# Patient Record
Sex: Male | Born: 1950
Health system: Southern US, Community
[De-identification: ages and names within clinical notes are randomized; demographics above are authoritative.]

## PROBLEM LIST (undated history)

## (undated) DIAGNOSIS — B029 Zoster without complications: Secondary | ICD-10-CM

## (undated) DIAGNOSIS — IMO0001 Reserved for inherently not codable concepts without codable children: Secondary | ICD-10-CM

## (undated) DIAGNOSIS — D692 Other nonthrombocytopenic purpura: Secondary | ICD-10-CM

## (undated) DIAGNOSIS — K5790 Diverticulosis of intestine, part unspecified, without perforation or abscess without bleeding: Secondary | ICD-10-CM

## (undated) DIAGNOSIS — J45909 Unspecified asthma, uncomplicated: Secondary | ICD-10-CM

## (undated) DIAGNOSIS — R7989 Other specified abnormal findings of blood chemistry: Secondary | ICD-10-CM

## (undated) DIAGNOSIS — E079 Disorder of thyroid, unspecified: Secondary | ICD-10-CM

## (undated) DIAGNOSIS — K5792 Diverticulitis of intestine, part unspecified, without perforation or abscess without bleeding: Secondary | ICD-10-CM

## (undated) DIAGNOSIS — G43909 Migraine, unspecified, not intractable, without status migrainosus: Secondary | ICD-10-CM

## (undated) DIAGNOSIS — K439 Ventral hernia without obstruction or gangrene: Secondary | ICD-10-CM

## (undated) DIAGNOSIS — C9110 Chronic lymphocytic leukemia of B-cell type not having achieved remission: Secondary | ICD-10-CM

## (undated) DIAGNOSIS — IMO0002 Reserved for concepts with insufficient information to code with codable children: Secondary | ICD-10-CM

## (undated) DIAGNOSIS — C801 Malignant (primary) neoplasm, unspecified: Secondary | ICD-10-CM

## (undated) DIAGNOSIS — T7840XA Allergy, unspecified, initial encounter: Secondary | ICD-10-CM

## (undated) DIAGNOSIS — I10 Essential (primary) hypertension: Secondary | ICD-10-CM

## (undated) DIAGNOSIS — U071 COVID-19: Secondary | ICD-10-CM

## (undated) DIAGNOSIS — J449 Chronic obstructive pulmonary disease, unspecified: Secondary | ICD-10-CM

## (undated) DIAGNOSIS — M1990 Unspecified osteoarthritis, unspecified site: Secondary | ICD-10-CM

## (undated) DIAGNOSIS — H9190 Unspecified hearing loss, unspecified ear: Secondary | ICD-10-CM

## (undated) DIAGNOSIS — K219 Gastro-esophageal reflux disease without esophagitis: Secondary | ICD-10-CM

## (undated) HISTORY — DX: Reserved for concepts with insufficient information to code with codable children: IMO0002

## (undated) HISTORY — DX: Other nonthrombocytopenic purpura: D69.2

## (undated) HISTORY — PX: HERNIA REPAIR: SHX51

## (undated) HISTORY — DX: Essential (primary) hypertension: I10

## (undated) HISTORY — DX: Zoster without complications: B02.9

## (undated) HISTORY — PX: COLONOSCOPY: SHX174

## (undated) HISTORY — DX: Disorder of thyroid, unspecified: E07.9

## (undated) HISTORY — DX: Chronic lymphocytic leukemia of B-cell type not having achieved remission: C91.10

## (undated) HISTORY — PX: OTHER SURGICAL HISTORY: SHX169

## (undated) HISTORY — DX: Allergy, unspecified, initial encounter: T78.40XA

## (undated) HISTORY — DX: Other specified abnormal findings of blood chemistry: R79.89

## (undated) HISTORY — DX: Gastro-esophageal reflux disease without esophagitis: K21.9

## (undated) HISTORY — DX: Chronic obstructive pulmonary disease, unspecified: J44.9

## (undated) HISTORY — DX: Migraine, unspecified, not intractable, without status migrainosus: G43.909

## (undated) HISTORY — DX: Malignant (primary) neoplasm, unspecified: C80.1

## (undated) HISTORY — DX: Diverticulitis of intestine, part unspecified, without perforation or abscess without bleeding: K57.92

## (undated) HISTORY — DX: Unspecified osteoarthritis, unspecified site: M19.90

## (undated) HISTORY — DX: Unspecified asthma, uncomplicated: J45.909

---

## 2003-12-07 HISTORY — PX: NEPHRECTOMY: SHX65

## 2003-12-07 HISTORY — PX: KIDNEY CYST REMOVAL: SHX684

## 2006-01-06 HISTORY — PX: OTHER SURGICAL HISTORY: SHX169

## 2006-05-06 HISTORY — PX: CHOLECYSTECTOMY: SHX55

## 2006-08-06 HISTORY — PX: COLON SURGERY: SHX602

## 2013-01-31 LAB — TSH: TSH: 1.84 u[IU]/mL (ref 0.41–5.90)

## 2013-01-31 LAB — BASIC METABOLIC PANEL
BUN: 19 mg/dL (ref 4–21)
Creatinine: 1.6 mg/dL — AB (ref 0.6–1.3)
Potassium: 5.6 mmol/L — AB (ref 3.4–5.3)

## 2013-01-31 LAB — LIPID PANEL
Cholesterol: 172 mg/dL (ref 0–200)
HDL: 31 mg/dL — AB (ref 35–70)
LDL Cholesterol: 107 mg/dL

## 2013-01-31 LAB — HEPATIC FUNCTION PANEL
ALT: 12 U/L (ref 10–40)
AST: 18 U/L (ref 14–40)

## 2013-01-31 LAB — CBC AND DIFFERENTIAL: WBC: 12.1 10^3/mL

## 2013-07-16 ENCOUNTER — Ambulatory Visit (INDEPENDENT_AMBULATORY_CARE_PROVIDER_SITE_OTHER): Payer: Medicare Other | Admitting: Family Medicine

## 2013-07-16 ENCOUNTER — Encounter: Payer: Self-pay | Admitting: Family Medicine

## 2013-07-16 VITALS — BP 168/90 | HR 68 | Temp 97.0°F | Resp 19 | Ht 66.0 in | Wt 275.0 lb

## 2013-07-16 DIAGNOSIS — G894 Chronic pain syndrome: Secondary | ICD-10-CM

## 2013-07-16 DIAGNOSIS — J449 Chronic obstructive pulmonary disease, unspecified: Secondary | ICD-10-CM

## 2013-07-16 DIAGNOSIS — I10 Essential (primary) hypertension: Secondary | ICD-10-CM

## 2013-07-16 DIAGNOSIS — E669 Obesity, unspecified: Secondary | ICD-10-CM

## 2013-07-16 DIAGNOSIS — K219 Gastro-esophageal reflux disease without esophagitis: Secondary | ICD-10-CM

## 2013-07-16 DIAGNOSIS — E039 Hypothyroidism, unspecified: Secondary | ICD-10-CM

## 2013-07-16 DIAGNOSIS — N184 Chronic kidney disease, stage 4 (severe): Secondary | ICD-10-CM

## 2013-07-16 MED ORDER — AMLODIPINE BESYLATE 5 MG PO TABS: 5.0000 mg | ORAL_TABLET | Freq: Every day | ORAL | Status: DC

## 2013-07-16 MED ORDER — METOPROLOL TARTRATE 50 MG PO TABS: 50.0000 mg | ORAL_TABLET | Freq: Two times a day (BID) | ORAL | Status: DC

## 2013-07-16 MED ORDER — LEVOTHYROXINE SODIUM 50 MCG PO TABS: 50.0000 ug | ORAL_TABLET | Freq: Every day | ORAL | Status: DC

## 2013-07-16 MED ORDER — ALLOPURINOL 100 MG PO TABS: 100.0000 mg | ORAL_TABLET | Freq: Every day | ORAL | Status: DC

## 2013-07-16 NOTE — Patient Instructions (Signed)
F/U 2 months for blood pressure Please release medical records at first dose Medications refilled

## 2013-07-17 ENCOUNTER — Encounter: Payer: Self-pay | Admitting: Family Medicine

## 2013-07-17 DIAGNOSIS — J449 Chronic obstructive pulmonary disease, unspecified: Secondary | ICD-10-CM | POA: Insufficient documentation

## 2013-07-17 DIAGNOSIS — J4489 Other specified chronic obstructive pulmonary disease: Secondary | ICD-10-CM | POA: Insufficient documentation

## 2013-07-17 DIAGNOSIS — E039 Hypothyroidism, unspecified: Secondary | ICD-10-CM | POA: Insufficient documentation

## 2013-07-17 DIAGNOSIS — N183 Chronic kidney disease, stage 3 unspecified: Secondary | ICD-10-CM | POA: Insufficient documentation

## 2013-07-17 DIAGNOSIS — G894 Chronic pain syndrome: Secondary | ICD-10-CM | POA: Insufficient documentation

## 2013-07-17 DIAGNOSIS — K219 Gastro-esophageal reflux disease without esophagitis: Secondary | ICD-10-CM | POA: Insufficient documentation

## 2013-07-17 DIAGNOSIS — E669 Obesity, unspecified: Secondary | ICD-10-CM | POA: Insufficient documentation

## 2013-07-17 NOTE — Assessment & Plan Note (Signed)
Continue Tyelnol #3 as needed

## 2013-07-17 NOTE — Assessment & Plan Note (Signed)
On dexilant

## 2013-07-17 NOTE — Assessment & Plan Note (Signed)
Recent labs in Feb, at baseline, no longer follows with renal

## 2013-07-17 NOTE — Assessment & Plan Note (Signed)
Continue current inhalers, with exacerbations, does well on doxycycline and steroid taper

## 2013-07-17 NOTE — Assessment & Plan Note (Signed)
Uncontrolled add norvasc 5mg  to lopressor

## 2013-07-17 NOTE — Progress Notes (Signed)
  Subjective:    Patient ID: [name] Ball, male    DOB: 1951-06-27, 62 y.o.   MRN: [id number]  HPI  Patient here to establish care. Previous PCP Dr. Marikay Alar [facility]. Medications and history reviewed Patient has profound hearing loss which occurred after treated for  colon Abscess in 2007. He is less than 10% hearing in the right ear and is completely deaf in the left ear. He has seen multiple ear nose and throat specialist in no longer uses a hearing aide. His family uses American sign language. His wife communicates all messages to him. He has a significant past medical history including renal cell carcinoma status post left nephrectomy in 2005. He subsequently has had colon surgery with partial sigmoidectomy in 2007. He's had multiple abdominal hernia secondary to his surgeries that have been repaired. He's also had arthroscopic surgery on bilateral knees. He is a history of hypertension was on diuretics in the past however secondary to his chronic kidney disease as a result of his interventions per above he is now on metoprolol twice a day which she's been on for years. His wife has been monitoring his blood pressure at home it is been in the 140s to 150s systolic and he's been more fatigued than usual. He did see his PCP prior to coming into [place] about 4 months ago at that time his thyroid studies is normal his EKG was normal his labs were overall unremarkable.  He also has remote history of gastric ulcer and is currently on Exelon he was on protonic some Prilosec in the past  His testosterone deficiency and his wife gets q. monthly testosterone shots. He had bone density in the past as well which was fairly normal.  History of chronic migraines and uses Relpax as needed. He's also a history of chronic pain due to his multiple abdominal surgeries and history problems. He often will hold his breath when he is in severe pain which interferes with his breathing at  nighttime. He is given Tylenol #3 to help with this pain.  There is concern about obstructive sleep apnea and he had one home test however he did not tolerate anything on his face there for they have decided not to treat this.  COPD-really maintained on Asmanex he's tried Symbicort and Advair without any improvement in symptoms. Jehovah witness- faith ( no blood products)   Review of Systems  GEN- + fatigue, fever, weight loss,weakness, recent illness HEENT- denies eye drainage, change in vision, nasal discharge, CVS- denies chest pain, palpitations RESP- denies SOB, cough, wheeze ABD- denies N/V, change in stools, abd pain GU- denies dysuria, hematuria, dribbling, incontinence MSK-+ joint pain, muscle aches, injury Neuro- denies headache, dizziness, syncope, seizure activity      Objective:   Physical Exam GEN- NAD, alert and oriented x3, repeat BP 148/96 HEENT- PERRL, EOMI, non injected sclera, pink conjunctiva, MMM, oropharynx clear Neck- Supple, no thyromegaly CVS- RRR, no murmur RESP-CTAB ABD-NABS,soft,NT,ND EXT- No edema Pulses- Radial, DP- 2+ Psych- normal affect and mood, very cheerful       Assessment & Plan:

## 2013-07-17 NOTE — Assessment & Plan Note (Signed)
TFT normal in Feb 2014

## 2013-07-20 ENCOUNTER — Telehealth: Payer: Self-pay | Admitting: Family Medicine

## 2013-07-20 NOTE — Telephone Encounter (Signed)
Okay to continue medication If BP stays < 100/60's and he is dizzy, fatigued, headache, stop the medication

## 2013-07-20 NOTE — Telephone Encounter (Signed)
Spoke to wife and given provider recommendations

## 2013-07-20 NOTE — Telephone Encounter (Signed)
Feeling much better on new BP med.  BP reading today was 100/64  P-69. Tuesday 156/101,  Wednesday 150/88, Thursday  157/87 Only concern is do they need to worry about BP being to low?  Please advise

## 2013-07-20 NOTE — Telephone Encounter (Signed)
Wife called back.  They has rechecked BP and is now 126/80.  But still would like advise on previous question.

## 2013-08-03 ENCOUNTER — Telehealth: Payer: Self-pay | Admitting: Family Medicine

## 2013-08-03 MED ORDER — TESTOSTERONE CYPIONATE 200 MG/ML IM SOLN: 200.0000 mg | INTRAMUSCULAR | Status: DC

## 2013-08-03 MED ORDER — DEXLANSOPRAZOLE 60 MG PO CPDR: 60.0000 mg | DELAYED_RELEASE_CAPSULE | Freq: Every day | ORAL | Status: DC

## 2013-08-03 NOTE — Telephone Encounter (Signed)
?  ok to refill Testosterone

## 2013-08-03 NOTE — Telephone Encounter (Signed)
Med phoned in °

## 2013-08-03 NOTE — Telephone Encounter (Signed)
Dexilant 60 mg cap 1 QAM #30 Testosterone Cyp 200 mg/ml inject 1 mL in the muscle once a month #10

## 2013-08-03 NOTE — Telephone Encounter (Signed)
Okay to refill? 

## 2013-08-06 ENCOUNTER — Other Ambulatory Visit: Payer: Self-pay | Admitting: Family Medicine

## 2013-08-07 NOTE — Telephone Encounter (Signed)
Meds refilled.

## 2013-09-18 ENCOUNTER — Ambulatory Visit (INDEPENDENT_AMBULATORY_CARE_PROVIDER_SITE_OTHER): Payer: Medicare Other | Admitting: Family Medicine

## 2013-09-18 VITALS — BP 150/90 | HR 80 | Temp 97.9°F | Resp 20 | Wt 276.0 lb

## 2013-09-18 DIAGNOSIS — I10 Essential (primary) hypertension: Secondary | ICD-10-CM

## 2013-09-18 DIAGNOSIS — L309 Dermatitis, unspecified: Secondary | ICD-10-CM | POA: Insufficient documentation

## 2013-09-18 DIAGNOSIS — L259 Unspecified contact dermatitis, unspecified cause: Secondary | ICD-10-CM

## 2013-09-18 DIAGNOSIS — J449 Chronic obstructive pulmonary disease, unspecified: Secondary | ICD-10-CM

## 2013-09-18 DIAGNOSIS — J019 Acute sinusitis, unspecified: Secondary | ICD-10-CM | POA: Insufficient documentation

## 2013-09-18 LAB — CBC WITH DIFFERENTIAL/PLATELET
Basophils Absolute: 0 10*3/uL (ref 0.0–0.1)
Basophils Relative: 0 % (ref 0–1)
Eosinophils Relative: 5 % (ref 0–5)
HCT: 45.6 % (ref 39.0–52.0)
Lymphocytes Relative: 44 % (ref 12–46)
MCH: 27.2 pg (ref 26.0–34.0)
Monocytes Absolute: 0.8 10*3/uL (ref 0.1–1.0)
Neutro Abs: 5.9 10*3/uL (ref 1.7–7.7)
Neutrophils Relative %: 45 % (ref 43–77)
Platelets: 243 10*3/uL (ref 150–400)
RDW: 14.4 % (ref 11.5–15.5)
WBC: 13 10*3/uL — ABNORMAL HIGH (ref 4.0–10.5)

## 2013-09-18 MED ORDER — TRIAMCINOLONE ACETONIDE 0.1 % EX CREA: TOPICAL_CREAM | Freq: Two times a day (BID) | CUTANEOUS | Status: DC

## 2013-09-18 MED ORDER — DOXYCYCLINE HYCLATE 100 MG PO TABS: 100.0000 mg | ORAL_TABLET | Freq: Two times a day (BID) | ORAL | Status: DC

## 2013-09-18 MED ORDER — PREDNISONE 10 MG PO TABS: ORAL_TABLET | ORAL | Status: DC

## 2013-09-18 NOTE — Assessment & Plan Note (Signed)
No evidence of acute exacerbation, I have sent prednisone in case he deteriorates

## 2013-09-18 NOTE — Patient Instructions (Addendum)
Use the nasal saline Use mucinex We will call with labs Treating sinus infection Antibiotics given Prednisone given in case needed  Triamcinolone for the hand  F/U 4 months

## 2013-09-18 NOTE — Assessment & Plan Note (Signed)
Treat with doxycycline which he has done well with  Mucinex  Nasal saline

## 2013-09-18 NOTE — Progress Notes (Signed)
  Subjective:    Patient ID: [name] Ball, male    DOB: [birth year], 62 y.o.   MRN: [id number]  HPI  - Wife provides ASL  Pt here with cough and fatigue, nasal drainage for the past 3 weeks. Known COPD with asthma, wife states over past 3 weeks, has had cough intermittently more congestion and fatigue. She has not noticed any wheezing or worsening cough No OTC meds given. They do remember him being exposed to smoke when oil burned on the stove which made him cough some.   Hand rash- left hand itching and peeling for past couple of weeks. Initially started a few years ago after work exposure he was treated for a severe fungal rash in palms as well. Recently he noticed itching in his palm and peeling of skin, No blisters, no new contacts with skin such as lotion, soap, chemicals.  HTN- BP have been running well at home 130's with additional norvasc.   He was also worried about is memory which wife states he has complained about for years. It takes him a minute to remember someones name. No other problems with short term memory. Worse past few days but has not slept well due to illness. -- Will table this until next visit and perform MMSE   Review of Systems - per above  GEN- + fatigue, fever, weight loss,weakness, recent illness HEENT- denies eye drainage, change in vision,+ nasal discharge, CVS- denies chest pain, palpitations RESP- denies SOB, +cough, wheeze ABD- denies N/V, change in stools, abd pain Neuro- denies headache, dizziness, syncope, seizure activity      Objective:   Physical Exam GEN- NAD, alert and oriented x3,  HEENT- PERRL, EOMI, non injected sclera, pink conjunctiva, MMM, oropharynx clear, + left maxillary sinus tenderness, nares- thick discharge, TM bilateral wax Neck- Supple, no LAD CVS- RRR, no murmur RESP-CTAB, no wheeze, upper airway congestion EXT- No edema Pulses- Radial 2+ Skin- Left palm, mild erythema and peeling/dryness, no blisters, no papules       Assessment & Plan:

## 2013-09-18 NOTE — Assessment & Plan Note (Signed)
Topical steroid. 

## 2013-09-18 NOTE — Assessment & Plan Note (Signed)
BP has been doing well, at home with additional norvasc Check BMET, CBC

## 2013-09-19 ENCOUNTER — Telehealth: Payer: Self-pay | Admitting: Family Medicine

## 2013-09-19 LAB — BASIC METABOLIC PANEL
BUN: 14 mg/dL (ref 6–23)
Chloride: 100 mEq/L (ref 96–112)
Potassium: 5.2 mEq/L (ref 3.5–5.3)
Sodium: 136 mEq/L (ref 135–145)

## 2013-09-19 NOTE — Telephone Encounter (Signed)
I sent prednisone 10mg  so he would have to take less pills. The dose is correct

## 2013-09-19 NOTE — Telephone Encounter (Signed)
Patient was put on prednisone yesterday on a 5 mg dose . But when it was called into the pharmacy it was called in for 10 mg. What is supposed to take?

## 2013-09-19 NOTE — Telephone Encounter (Signed)
Pt aware.

## 2013-10-05 ENCOUNTER — Telehealth: Payer: Self-pay | Admitting: Family Medicine

## 2013-10-05 MED ORDER — ACETAMINOPHEN-CODEINE #3 300-30 MG PO TABS: 1.0000 | ORAL_TABLET | ORAL | Status: DC | PRN

## 2013-10-05 NOTE — Telephone Encounter (Signed)
Okay to refill? 

## 2013-10-05 NOTE — Telephone Encounter (Signed)
rx printed for signature.  Try to call patient .  Left mess need to come to office and pick up

## 2013-10-05 NOTE — Telephone Encounter (Signed)
Refill Tylenol #3 300/30 mg   Last refill 07/31/13  #90

## 2013-10-11 ENCOUNTER — Other Ambulatory Visit: Payer: Self-pay

## 2013-10-31 ENCOUNTER — Other Ambulatory Visit: Payer: Self-pay | Admitting: Family Medicine

## 2013-10-31 MED ORDER — MOMETASONE FUROATE 220 MCG/INH IN AEPB: 2.0000 | INHALATION_SPRAY | Freq: Two times a day (BID) | RESPIRATORY_TRACT | Status: DC

## 2013-10-31 NOTE — Telephone Encounter (Signed)
Pts wife Joyce Gross is calling this morning to check on the status of her husbands refill on his asmex. She states that [facility] in [place] has tried to reach [place] to refill this. She would like to have it refilled today if anyway possible Call back number is [phone]

## 2013-10-31 NOTE — Telephone Encounter (Signed)
Med have been refilled already

## 2013-10-31 NOTE — Telephone Encounter (Signed)
Meds refilled.

## 2013-10-31 NOTE — Telephone Encounter (Signed)
His wife is calling back to check on the status of his medication!

## 2013-11-05 ENCOUNTER — Other Ambulatory Visit: Payer: Self-pay | Admitting: Family Medicine

## 2013-11-05 NOTE — Telephone Encounter (Signed)
Medication refilled per protocol. 

## 2013-11-19 ENCOUNTER — Other Ambulatory Visit: Payer: Self-pay | Admitting: Family Medicine

## 2013-11-19 ENCOUNTER — Ambulatory Visit (INDEPENDENT_AMBULATORY_CARE_PROVIDER_SITE_OTHER): Payer: Medicare Other | Admitting: Family Medicine

## 2013-11-19 ENCOUNTER — Encounter: Payer: Self-pay | Admitting: Family Medicine

## 2013-11-19 VITALS — BP 140/88 | HR 78 | Temp 97.9°F | Resp 18 | Ht 66.0 in | Wt 276.0 lb

## 2013-11-19 DIAGNOSIS — K469 Unspecified abdominal hernia without obstruction or gangrene: Secondary | ICD-10-CM

## 2013-11-19 DIAGNOSIS — L304 Erythema intertrigo: Secondary | ICD-10-CM | POA: Insufficient documentation

## 2013-11-19 DIAGNOSIS — K439 Ventral hernia without obstruction or gangrene: Secondary | ICD-10-CM

## 2013-11-19 DIAGNOSIS — L259 Unspecified contact dermatitis, unspecified cause: Secondary | ICD-10-CM

## 2013-11-19 DIAGNOSIS — L309 Dermatitis, unspecified: Secondary | ICD-10-CM

## 2013-11-19 DIAGNOSIS — L538 Other specified erythematous conditions: Secondary | ICD-10-CM

## 2013-11-19 MED ORDER — CLOTRIMAZOLE-BETAMETHASONE 1-0.05 % EX CREA: 1.0000 "application " | TOPICAL_CREAM | Freq: Two times a day (BID) | CUTANEOUS | Status: DC

## 2013-11-19 MED ORDER — NYSTATIN 100000 UNIT/GM EX POWD: CUTANEOUS | Status: DC

## 2013-11-19 NOTE — Progress Notes (Signed)
   Subjective:    Patient ID: [name] Ball, male    DOB: September 05, 1951, 62 y.o.   MRN: [id number]  HPI Patient here with continued pain and peeling and dryness. He states his pain still very sensitive to both hot and cold temperatures. He also has a lot of itching of the skin. The peeling and roughness on his now calls and palms have worsened despite using triamcinolone cream. He has not been using any new chemicals on the hands. The rash does appear similar to when he had a fungal infection in the past per wife  He's also had ongoing pain on and off in his lower abdomen and pelvis this is the site of his recurrent hernias. He has multiple areas of mesh from multiple hernia surgeries. He would like to have this evaluated as he feels a ripping sensation when he moves certain ways and when he walks. He is concerned that one of the mesh have split   Review of Systems  GEN- denies fatigue, fever, weight loss,weakness, recent illness HEENT- denies eye drainage, change in vision, nasal discharge, CVS- denies chest pain, palpitations RESP- denies SOB, cough, wheeze ABD- denies N/V, change in stools,+ abd pain GU- denies dysuria, hematuria, dribbling, incontinence MSK- denies joint pain, muscle aches, injury Neuro- denies headache, dizziness, syncope, seizure activity      Objective:   Physical Exam GEN- NAD, alert and oriented x3,  CVS- RRR, no murmur RESP-CTAB ABD-NABS,soft,mild TTP lower ventral hernia, epigastric hernia noted easily reduced EXT- No edema Pulses- Radial 2+ Skin- Left palm, mild erythema and peeling/dryness, left knuckle - hyperkeratotic skin over knuckles, few cracks in skin Raised erythematous plaqued beneath pannus Right side, +moisture white discharge      Assessment & Plan:

## 2013-11-19 NOTE — Assessment & Plan Note (Signed)
We will use nystatin pattern beneath the pain is because of all of the excessive moisture

## 2013-11-19 NOTE — Assessment & Plan Note (Signed)
He's had multiple hernias as well as repairs. It is noted on exam that he has recurrent ventral hernias. I will obtain CT of abdomen and pelvis to see if he is waiting for his meds. We will then decide on surgical intervention. He would like to wait until after the new year to have this done.

## 2013-11-19 NOTE — Assessment & Plan Note (Addendum)
This still looks more like hyperkeratotic dermatitis. He does not have any lesions elsewhere on the body. Since he has had a fungal infection similar to this in the past I will switch him to Lotrisone cream twice a day. If this does not help and he will be referred to dermatology

## 2013-11-19 NOTE — Patient Instructions (Signed)
Use the powder beneath the abdomen  Use the cream on hands twice a day Call if hands not improved and dermatology referral will be done CT scan for the hernia F/U as previous

## 2013-11-20 NOTE — Telephone Encounter (Signed)
Okay to refill , give 2 

## 2013-11-20 NOTE — Telephone Encounter (Signed)
?   Ok to refill, last refill 10/05/13, last ov 11/19/13

## 2013-11-20 NOTE — Telephone Encounter (Signed)
Rx printed for provider signature and faxed to pharmacy 

## 2013-11-21 ENCOUNTER — Telehealth: Payer: Self-pay | Admitting: *Deleted

## 2013-11-21 NOTE — Telephone Encounter (Signed)
errow

## 2013-11-23 ENCOUNTER — Telehealth: Payer: Self-pay | Admitting: Family Medicine

## 2013-11-23 MED ORDER — IPRATROPIUM BROMIDE HFA 17 MCG/ACT IN AERS: 2.0000 | INHALATION_SPRAY | Freq: Four times a day (QID) | RESPIRATORY_TRACT | Status: DC

## 2013-11-23 NOTE — Telephone Encounter (Signed)
Medication refilled per protocol. 

## 2013-11-23 NOTE — Telephone Encounter (Signed)
Message copied by Donne Anon on Fri Nov 23, 2013 11:22 AM ------      Message from: [facility], Lindalou Hose      Created: Thu Nov 22, 2013  9:04 AM       Needs rescue inhaler- [facility] called in to [facility] ------

## 2013-11-26 ENCOUNTER — Telehealth: Payer: Self-pay | Admitting: Family Medicine

## 2013-11-26 NOTE — Telephone Encounter (Signed)
Pt wife is calling in regards to the mri that he was referred to she just wants to make sure this is the right thing Call back number is  [phone]

## 2013-11-27 ENCOUNTER — Encounter: Payer: Self-pay | Admitting: Family Medicine

## 2013-12-03 ENCOUNTER — Other Ambulatory Visit: Payer: Self-pay | Admitting: Family Medicine

## 2013-12-04 ENCOUNTER — Other Ambulatory Visit (HOSPITAL_COMMUNITY): Payer: Self-pay

## 2013-12-04 NOTE — Addendum Note (Signed)
Addended by: Milinda Antis F on: 12/04/2013 08:37 PM   Modules accepted: Orders

## 2013-12-04 NOTE — Telephone Encounter (Signed)
Medication refilled per protocol. 

## 2013-12-13 ENCOUNTER — Telehealth: Payer: Self-pay | Admitting: *Deleted

## 2013-12-13 ENCOUNTER — Other Ambulatory Visit: Payer: Self-pay | Admitting: Family Medicine

## 2013-12-13 ENCOUNTER — Other Ambulatory Visit (HOSPITAL_COMMUNITY): Payer: Self-pay

## 2013-12-13 MED ORDER — DOXYCYCLINE HYCLATE 100 MG PO TABS: 100.0000 mg | ORAL_TABLET | Freq: Two times a day (BID) | ORAL | Status: DC

## 2013-12-13 NOTE — Telephone Encounter (Signed)
Pt called and made aware of RX 

## 2013-12-13 NOTE — Telephone Encounter (Signed)
Doxycycline 100 bid for 7 days. 

## 2013-12-13 NOTE — Telephone Encounter (Signed)
Pt wife calling wanting to know if you can call in doxycycline for pt (states that you told her to call and you will refill) he has respiratory issue and wheezing in chest. I told her needed to be seen if requesting antibiotic and wheezing in chest, again pt said that you have done this before.

## 2013-12-19 ENCOUNTER — Ambulatory Visit (HOSPITAL_COMMUNITY): Payer: Medicare Other

## 2013-12-26 ENCOUNTER — Ambulatory Visit (HOSPITAL_COMMUNITY)
Admission: RE | Admit: 2013-12-26 | Discharge: 2013-12-26 | Disposition: A | Discharge status: Home / Self Care | Payer: Medicare Other | Source: Ambulatory Visit | Attending: Family Medicine | Admitting: Family Medicine

## 2013-12-26 DIAGNOSIS — R109 Unspecified abdominal pain: Secondary | ICD-10-CM | POA: Insufficient documentation

## 2013-12-26 DIAGNOSIS — T191XXA Foreign body in bladder, initial encounter: Secondary | ICD-10-CM

## 2013-12-26 DIAGNOSIS — Z905 Acquired absence of kidney: Secondary | ICD-10-CM | POA: Insufficient documentation

## 2013-12-26 DIAGNOSIS — K439 Ventral hernia without obstruction or gangrene: Secondary | ICD-10-CM | POA: Insufficient documentation

## 2013-12-26 DIAGNOSIS — Z85528 Personal history of other malignant neoplasm of kidney: Secondary | ICD-10-CM | POA: Insufficient documentation

## 2013-12-26 DIAGNOSIS — T190XXA Foreign body in urethra, initial encounter: Secondary | ICD-10-CM | POA: Insufficient documentation

## 2013-12-26 DIAGNOSIS — IMO0002 Reserved for concepts with insufficient information to code with codable children: Secondary | ICD-10-CM | POA: Insufficient documentation

## 2013-12-26 DIAGNOSIS — K469 Unspecified abdominal hernia without obstruction or gangrene: Secondary | ICD-10-CM

## 2013-12-31 ENCOUNTER — Telehealth: Payer: Self-pay | Admitting: Family Medicine

## 2013-12-31 ENCOUNTER — Other Ambulatory Visit: Payer: Self-pay | Admitting: Family Medicine

## 2013-12-31 DIAGNOSIS — L309 Dermatitis, unspecified: Secondary | ICD-10-CM

## 2013-12-31 NOTE — Telephone Encounter (Signed)
Pt wife is calling about her husbands CT's and she states she has the other CT information about what DR done them and the date Call back number is [phone]

## 2014-01-01 NOTE — Telephone Encounter (Signed)
Called pt wife and she stated that Dr. Buelah Manis had already called her

## 2014-01-06 ENCOUNTER — Other Ambulatory Visit: Payer: Self-pay | Admitting: Family Medicine

## 2014-01-06 DIAGNOSIS — K469 Unspecified abdominal hernia without obstruction or gangrene: Secondary | ICD-10-CM

## 2014-01-07 ENCOUNTER — Other Ambulatory Visit: Payer: Self-pay | Admitting: Family Medicine

## 2014-01-09 ENCOUNTER — Encounter (HOSPITAL_COMMUNITY): Payer: Self-pay | Admitting: Emergency Medicine

## 2014-01-09 ENCOUNTER — Emergency Department (HOSPITAL_COMMUNITY)
Admission: EM | Admit: 2014-01-09 | Discharge: 2014-01-09 | Disposition: A | Discharge status: Home / Self Care | Payer: Medicare Other | Attending: Emergency Medicine | Admitting: Emergency Medicine

## 2014-01-09 ENCOUNTER — Emergency Department (HOSPITAL_COMMUNITY): Payer: Medicare Other

## 2014-01-09 DIAGNOSIS — Z792 Long term (current) use of antibiotics: Secondary | ICD-10-CM | POA: Insufficient documentation

## 2014-01-09 DIAGNOSIS — Z9889 Other specified postprocedural states: Secondary | ICD-10-CM | POA: Insufficient documentation

## 2014-01-09 DIAGNOSIS — J441 Chronic obstructive pulmonary disease with (acute) exacerbation: Secondary | ICD-10-CM | POA: Insufficient documentation

## 2014-01-09 DIAGNOSIS — IMO0002 Reserved for concepts with insufficient information to code with codable children: Secondary | ICD-10-CM | POA: Insufficient documentation

## 2014-01-09 DIAGNOSIS — Z87891 Personal history of nicotine dependence: Secondary | ICD-10-CM | POA: Insufficient documentation

## 2014-01-09 DIAGNOSIS — Z9089 Acquired absence of other organs: Secondary | ICD-10-CM | POA: Insufficient documentation

## 2014-01-09 DIAGNOSIS — Z79899 Other long term (current) drug therapy: Secondary | ICD-10-CM | POA: Insufficient documentation

## 2014-01-09 DIAGNOSIS — R109 Unspecified abdominal pain: Secondary | ICD-10-CM

## 2014-01-09 DIAGNOSIS — I10 Essential (primary) hypertension: Secondary | ICD-10-CM | POA: Insufficient documentation

## 2014-01-09 DIAGNOSIS — Z872 Personal history of diseases of the skin and subcutaneous tissue: Secondary | ICD-10-CM | POA: Insufficient documentation

## 2014-01-09 DIAGNOSIS — D692 Other nonthrombocytopenic purpura: Secondary | ICD-10-CM | POA: Insufficient documentation

## 2014-01-09 DIAGNOSIS — Z7982 Long term (current) use of aspirin: Secondary | ICD-10-CM | POA: Insufficient documentation

## 2014-01-09 DIAGNOSIS — K439 Ventral hernia without obstruction or gangrene: Secondary | ICD-10-CM | POA: Insufficient documentation

## 2014-01-09 DIAGNOSIS — K219 Gastro-esophageal reflux disease without esophagitis: Secondary | ICD-10-CM | POA: Insufficient documentation

## 2014-01-09 DIAGNOSIS — E039 Hypothyroidism, unspecified: Secondary | ICD-10-CM | POA: Insufficient documentation

## 2014-01-09 DIAGNOSIS — Z862 Personal history of diseases of the blood and blood-forming organs and certain disorders involving the immune mechanism: Secondary | ICD-10-CM | POA: Insufficient documentation

## 2014-01-09 DIAGNOSIS — E291 Testicular hypofunction: Secondary | ICD-10-CM | POA: Insufficient documentation

## 2014-01-09 DIAGNOSIS — J45901 Unspecified asthma with (acute) exacerbation: Secondary | ICD-10-CM

## 2014-01-09 DIAGNOSIS — G43909 Migraine, unspecified, not intractable, without status migrainosus: Secondary | ICD-10-CM | POA: Insufficient documentation

## 2014-01-09 LAB — COMPREHENSIVE METABOLIC PANEL
ALK PHOS: 46 U/L (ref 39–117)
ALT: 19 U/L (ref 0–53)
AST: 14 U/L (ref 0–37)
Albumin: 3.6 g/dL (ref 3.5–5.2)
BUN: 20 mg/dL (ref 6–23)
CO2: 25 mEq/L (ref 19–32)
Calcium: 8.9 mg/dL (ref 8.4–10.5)
Chloride: 101 mEq/L (ref 96–112)
Creatinine, Ser: 1.47 mg/dL — ABNORMAL HIGH (ref 0.50–1.35)
GFR calc non Af Amer: 49 mL/min — ABNORMAL LOW (ref >90)
GFR, EST AFRICAN AMERICAN: 57 mL/min — AB (ref >90)
GLUCOSE: 98 mg/dL (ref 70–99)
POTASSIUM: 4.6 meq/L (ref 3.7–5.3)
SODIUM: 137 meq/L (ref 137–147)
TOTAL PROTEIN: 6.5 g/dL (ref 6.0–8.3)
Total Bilirubin: 0.5 mg/dL (ref 0.3–1.2)

## 2014-01-09 LAB — CBC WITH DIFFERENTIAL/PLATELET
BASOS ABS: 0 10*3/uL (ref 0.0–0.1)
BASOS PCT: 0 % (ref 0–1)
EOS ABS: 0.7 10*3/uL (ref 0.0–0.7)
EOS PCT: 6 % — AB (ref 0–5)
HEMATOCRIT: 44.8 % (ref 39.0–52.0)
Hemoglobin: 14.3 g/dL (ref 13.0–17.0)
Lymphocytes Relative: 50 % — ABNORMAL HIGH (ref 12–46)
Lymphs Abs: 5.9 10*3/uL — ABNORMAL HIGH (ref 0.7–4.0)
MCH: 27.3 pg (ref 26.0–34.0)
MCHC: 31.9 g/dL (ref 30.0–36.0)
MCV: 85.7 fL (ref 78.0–100.0)
MONO ABS: 0.7 10*3/uL (ref 0.1–1.0)
Monocytes Relative: 6 % (ref 3–12)
Neutro Abs: 4.5 10*3/uL (ref 1.7–7.7)
Neutrophils Relative %: 38 % — ABNORMAL LOW (ref 43–77)
Platelets: 207 10*3/uL (ref 150–400)
RBC: 5.23 MIL/uL (ref 4.22–5.81)
RDW: 15 % (ref 11.5–15.5)
WBC: 11.8 10*3/uL — ABNORMAL HIGH (ref 4.0–10.5)

## 2014-01-09 LAB — LACTIC ACID, PLASMA: Lactic Acid, Venous: 0.8 mmol/L (ref 0.5–2.2)

## 2014-01-09 MED ORDER — SENNOSIDES-DOCUSATE SODIUM 8.6-50 MG PO TABS: 2.0000 | ORAL_TABLET | Freq: Every day | ORAL | Status: DC

## 2014-01-09 MED ORDER — HYDROMORPHONE HCL PF 1 MG/ML IJ SOLN
1.0000 mg | Freq: Once | INTRAMUSCULAR | Status: AC
  Administered 2014-01-09: 1 mg via INTRAVENOUS
  Filled 2014-01-09: qty 1

## 2014-01-09 MED ORDER — ACETAMINOPHEN-CODEINE #3 300-30 MG PO TABS: 1.0000 | ORAL_TABLET | ORAL | Status: DC | PRN

## 2014-01-09 MED ORDER — ONDANSETRON HCL 4 MG/2ML IJ SOLN
4.0000 mg | Freq: Once | INTRAMUSCULAR | Status: AC
  Administered 2014-01-09: 4 mg via INTRAVENOUS
  Filled 2014-01-09: qty 2

## 2014-01-09 MED ORDER — SODIUM CHLORIDE 0.9 % IV SOLN
Freq: Once | INTRAVENOUS | Status: AC
  Administered 2014-01-09: 17:00:00 via INTRAVENOUS

## 2014-01-09 MED ORDER — IOHEXOL 300 MG/ML  SOLN
50.0000 mL | Freq: Once | INTRAMUSCULAR | Status: AC | PRN
  Administered 2014-01-09: 50 mL via ORAL

## 2014-01-09 NOTE — Discharge Instructions (Signed)
As discussed, it is important that you follow up as soon as possible with your physician for continued management of your condition.  Your abdominal wall hernia requires specialist care.  If you develop any new, or concerning changes in your condition, please return to the emergency department immediately.

## 2014-01-09 NOTE — ED Notes (Signed)
Hernia, waiting to see Dr. Arnoldo Morale. Was referred to ED. Pain radiating around right side since last night.

## 2014-01-09 NOTE — ED Provider Notes (Signed)
CSN: [id number]     Arrival date & time 01/09/14  1306 History  This chart was scribed for Carmin Muskrat, MD by Ludger Nutting, ED Scribe. This patient was seen in room APA10/APA10 and the patient's care was started 3:37 PM.     Chief Complaint  Patient presents with  . Hernia    The history is provided by the patient and the spouse. No language interpreter was used.    HPI Comments: Mikolaj Woolstenhulme is a 63 y.o. male who presents to the Emergency Department complaining of constant  right side pain that is radiating around towards to the right back that worsened last night. Pt has a history of kidney surgery in 2005 and cholecystectomy in 2007. He has had abdominal mesh inserted in 2006, 2008, and 2010. Pt had a recent CT scan a few weeks ago which showed loops of small bowel protruding in the hernia. He denies fever, vomiting, scrotal swelling or pain.    Past Medical History  Diagnosis Date  . Allergy   . COPD (chronic obstructive pulmonary disease)   . Hypertension   . Thyroid disease     hypothryoid  . Migraine   . Ulcer   . Diverticulitis   . Solar purpura   . Asthma   . Low testosterone   . GERD (gastroesophageal reflux disease)    Past Surgical History  Procedure Laterality Date  . Kidney cyst removal  jan 2005    renal cell carcinoma  . Abdominal mesh inserted  june 2006, may 2008,dec 2010  . Knee minescus repair Left feb 2007  . Cholecystectomy  june 2007  . Colon surgery  sept 2007   No family history on file. History  Substance Use Topics  . Smoking status: Former Smoker    Quit date: 07/17/1999  . Smokeless tobacco: Not on file  . Alcohol Use: No    Review of Systems  Constitutional:       Per HPI, otherwise negative  HENT:       Per HPI, otherwise negative  Respiratory:       Per HPI, otherwise negative  Cardiovascular:       Per HPI, otherwise negative  Gastrointestinal: Negative for vomiting.  Endocrine:       Negative aside from HPI   Genitourinary:       Neg aside from HPI   Musculoskeletal:       Per HPI, otherwise negative  Skin: Negative.   Neurological: Negative for syncope.    Allergies  Review of patient's allergies indicates no known allergies.  Home Medications   Current Outpatient Rx  Name  Route  Sig  Dispense  Refill  . acetaminophen-codeine (TYLENOL #3) 300-30 MG per tablet      TAKE 1 TABLET BY MOUTH EVERY 4 HOURS AS NEEDED FOR FOR PAIN   90 tablet   1   . allopurinol (ZYLOPRIM) 100 MG tablet      TAKE 1 TABLET BY MOUTH EVERY DAY   90 tablet   0   . amLODipine (NORVASC) 5 MG tablet      TAKE 1 TABLET BY MOUTH EVERY DAY FOR BLOOD PRESSURE   30 tablet   2   . aspirin 325 MG EC tablet   Oral   Take 325 mg by mouth daily.         . calcium-vitamin D (OSCAL WITH D) 500-200 MG-UNIT per tablet   Oral   Take 1 tablet by mouth.         Marland Kitchen  clotrimazole-betamethasone (LOTRISONE) cream   Topical   Apply 1 application topically 2 (two) times daily.   45 g   1   . DEXILANT 60 MG capsule      TAKE 1 CAPSULE BY MOUTH DAILY   30 capsule   2   . diphenhydrAMINE (BENADRYL) 25 MG tablet   Oral   Take 25 mg by mouth every 6 (six) hours as needed for itching.         Marland Kitchen doxycycline (VIBRA-TABS) 100 MG tablet   Oral   Take 1 tablet (100 mg total) by mouth 2 (two) times daily.   14 tablet   0   . eletriptan (RELPAX) 20 MG tablet   Oral   Take 20 mg by mouth as needed for migraine. One tablet by mouth at onset of headache. May repeat in 2 hours if headache persists or recurs.         Marland Kitchen ipratropium (ATROVENT HFA) 17 MCG/ACT inhaler   Inhalation   Inhale 2 puffs into the lungs every 6 (six) hours.   1 Inhaler   11   . levalbuterol (XOPENEX HFA) 45 MCG/ACT inhaler   Inhalation   Inhale 1-2 puffs into the lungs every 4 (four) hours as needed for wheezing.         Marland Kitchen levothyroxine (SYNTHROID, LEVOTHROID) 50 MCG tablet      TAKE 1 TABLET BY MOUTH DAILY BEFORE BREAKFAST   90  tablet   1   . loratadine (CLARITIN) 10 MG tablet   Oral   Take 10 mg by mouth daily.         . metoprolol (LOPRESSOR) 50 MG tablet   Oral   Take 1 tablet (50 mg total) by mouth 2 (two) times daily.   180 tablet   1   . mometasone (ASMANEX) 220 MCG/INH inhaler   Inhalation   Inhale 2 puffs into the lungs 2 (two) times daily.   1 Inhaler   1   . nystatin (MYCOSTATIN/NYSTOP) 100000 UNIT/GM POWD      Apply to affected area three times a day   60 g   1   . predniSONE (DELTASONE) 10 MG tablet      Take 4 tablets x 3 days, then 3 tablets x 3 days x then 2 tablets x 3 days, then 1 tablets 3 days then stop   30 tablet   0   . testosterone cypionate (DEPO-TESTOSTERONE) 200 MG/ML injection   Intramuscular   Inject 1 mL (200 mg total) into the muscle every 14 (fourteen) days.   10 mL   2   . vitamin B-12 (CYANOCOBALAMIN) 1000 MCG tablet   Oral   Take 1,000 mcg by mouth daily.         . vitamin E 400 UNIT capsule   Oral   Take 400 Units by mouth daily.          BP 169/94  Pulse 78  Temp(Src) 97.4 F (36.3 C) (Oral)  Resp 18  SpO2 98% Physical Exam  Nursing note and vitals reviewed. Constitutional: He is oriented to person, place, and time. He appears well-developed. No distress.  HENT:  Head: Normocephalic and atraumatic.  Eyes: Conjunctivae and EOM are normal.  Cardiovascular: Normal rate and regular rhythm.   Pulmonary/Chest: Effort normal. No stridor. No respiratory distress. He has wheezes (Audible expiratory wheezing ).  Abdominal: Soft. He exhibits no distension. There is tenderness. A hernia is present.  Upper abdomen is soft and  non tender. Lower abdomen has a 15 cm palpable hernia with tenderness to palpation. No superficial changes  Musculoskeletal: He exhibits no edema.  Neurological: He is alert and oriented to person, place, and time.  Skin: Skin is warm and dry.  Psychiatric: He has a normal mood and affect.    ED Course  Procedures  (including critical care time)  DIAGNOSTIC STUDIES: Oxygen Saturation is 98% on RA, normal by my interpretation.    COORDINATION OF CARE: 3:47 PM Discussed treatment plan with pt at bedside and pt agreed to plan.   Labs Review Labs Reviewed - No data to display Imaging Review No results found.  EKG Interpretation   None     7:50 PM On exam the patient is ambulatory, smiling.  He denies new complaints.  I reviewed the findings with him and his wife.   MDM   I personally performed the services described in this documentation, which was scribed in my presence. The recorded information has been reviewed and is accurate.   This patient presents with ongoing pain from a previously diagnosed ventral hernia.  With the description of new, more severe pain, incarceration and/or strangulation were immediate considerations.  Patient's CT scan was largely reassuring, with no current evidence of strangulation and/or incarceration, though there is notable creation of both bladder and small bowel. Patient is appropriate for discharge with next a surgical followup  Carmin Muskrat, MD 01/09/14 [phone]

## 2014-01-14 ENCOUNTER — Other Ambulatory Visit: Payer: Self-pay | Admitting: Family Medicine

## 2014-01-15 ENCOUNTER — Telehealth: Payer: Self-pay | Admitting: *Deleted

## 2014-01-15 NOTE — Telephone Encounter (Signed)
Ok to refill 

## 2014-01-15 NOTE — Telephone Encounter (Signed)
Okay to refill, there is likley a quantity maximum so send in 9 pill with 3 refills

## 2014-01-16 MED ORDER — ELETRIPTAN HYDROBROMIDE 20 MG PO TABS: 20.0000 mg | ORAL_TABLET | ORAL | Status: DC | PRN

## 2014-01-16 NOTE — Telephone Encounter (Signed)
Meds refilled.

## 2014-01-17 ENCOUNTER — Encounter: Payer: Self-pay | Admitting: *Deleted

## 2014-01-17 NOTE — Telephone Encounter (Signed)
   This encounter was created in error - please disregard. This encouter was created in error-please disregard This encounter was created in error - please disregard.

## 2014-01-21 ENCOUNTER — Ambulatory Visit (INDEPENDENT_AMBULATORY_CARE_PROVIDER_SITE_OTHER): Payer: Medicare Other | Admitting: Surgery

## 2014-01-21 ENCOUNTER — Encounter (INDEPENDENT_AMBULATORY_CARE_PROVIDER_SITE_OTHER): Payer: Self-pay | Admitting: Surgery

## 2014-01-21 VITALS — BP 148/86 | HR 88 | Temp 97.9°F | Resp 16 | Ht 66.0 in | Wt 274.8 lb

## 2014-01-21 DIAGNOSIS — K432 Incisional hernia without obstruction or gangrene: Secondary | ICD-10-CM | POA: Insufficient documentation

## 2014-01-21 NOTE — Progress Notes (Signed)
Patient ID: [name] Ball, male   DOB: [birth year], 63 y.o.   MRN: [id number]  Chief Complaint  Patient presents with  . New Evaluation    eval recurrent abd hernia    HPI [name] Ball is a 63 y.o. male.   HPI patient sent at the request of Alycia Rossetti, MD 4 complex incisional hernia. Patient has a complex surgical history of multiple abdominal operations done at outside facility prior to moving into the area. He has had a nephrectomy, partial colectomy, and subsequent incisional hernia repairs starting 2006. He has had a total of 3 repairs almost every other year with mesh. I do not have the op notes review at this point in time. He developed more lower bowel wall swelling and pain which prompted him to go to the emergency room at [facility] for evaluation. CT revealed multiple abdominal wall defects with the largest being infraumbilical containing bladder, small bowel and omentum. He was nonobstructed. He continues to have lower abdominal pain especially with movement or lifting. He is not vomiting. Bowels are moving. The pain is made better with rest.   Past Medical History  Diagnosis Date  . Allergy   . COPD (chronic obstructive pulmonary disease)   . Hypertension   . Thyroid disease     hypothryoid  . Migraine   . Ulcer   . Diverticulitis   . Solar purpura   . Asthma   . Low testosterone   . GERD (gastroesophageal reflux disease)   . Arthritis   . Cancer     kidney    Past Surgical History  Procedure Laterality Date  . Kidney cyst removal  jan 2005    renal cell carcinoma  . Abdominal mesh inserted  june 2006, may 2008,dec 2010  . Knee minescus repair Left feb 2007  . Cholecystectomy  june 2007  . Colon surgery  sept 2007  . Colonoscopy    . Hernia repair  05/2005 5/20108, 11/2009    with mesh    Family History  Problem Relation Age of Onset  . Cancer Mother     breast    Social History History  Substance Use Topics  . Smoking status: Former Smoker    Quit  date: 12/06/1996  . Smokeless tobacco: Never Used  . Alcohol Use: Yes     Comment: little     Allergies  Allergen Reactions  . Ether     Doesn't wake up from it    Current Outpatient Prescriptions  Medication Sig Dispense Refill  . acetaminophen-codeine (TYLENOL #3) 300-30 MG per tablet Take 1 tablet by mouth every 4 (four) hours as needed for moderate pain.  30 tablet  0  . allopurinol (ZYLOPRIM) 100 MG tablet Take 100 mg by mouth at bedtime.      Marland Kitchen amLODipine (NORVASC) 5 MG tablet Take 5 mg by mouth daily.      Marland Kitchen aspirin 325 MG EC tablet Take 650 mg by mouth at bedtime.       Marland Kitchen aspirin-acetaminophen-caffeine (EXCEDRIN MIGRAINE) 250-250-65 MG per tablet Take 1 tablet by mouth every 6 (six) hours as needed for migraine.      . cholecalciferol (VITAMIN D) 1000 UNITS tablet Take 1,000 Units by mouth daily.      . Clobetasol Prop Emollient Base (CLOBETASOL PROPIONATE E) 0.05 % emollient cream Apply topically 2 (two) times daily.      . clotrimazole-betamethasone (LOTRISONE) cream Apply 1 application topically 2 (two) times daily.  45 g  1  .  dexlansoprazole (DEXILANT) 60 MG capsule Take 60 mg by mouth at bedtime.      . diphenhydrAMINE (BENADRYL) 25 MG tablet Take 25 mg by mouth every 6 (six) hours as needed for allergies.       Marland Kitchen eletriptan (RELPAX) 20 MG tablet Take 1 tablet (20 mg total) by mouth as needed for migraine. One tablet by mouth at onset of headache. May repeat in 2 hours if headache persists or recurs.  10 tablet  3  . ipratropium (ATROVENT HFA) 17 MCG/ACT inhaler Inhale 2 puffs into the lungs every 6 (six) hours.  1 Inhaler  11  . levalbuterol (XOPENEX HFA) 45 MCG/ACT inhaler Inhale 1-2 puffs into the lungs every 4 (four) hours as needed for wheezing.      Marland Kitchen levothyroxine (SYNTHROID, LEVOTHROID) 50 MCG tablet Take 50 mcg by mouth daily before breakfast.      . loratadine (CLARITIN) 10 MG tablet Take 10 mg by mouth at bedtime.       . metoprolol (LOPRESSOR) 50 MG tablet TAKE  1 TABLET BY MOUTH TWICE DAILY  60 tablet  1  . mometasone (ASMANEX) 220 MCG/INH inhaler Inhale 2 puffs into the lungs 2 (two) times daily.  1 Inhaler  1  . nystatin (MYCOSTATIN/NYSTOP) 100000 UNIT/GM POWD daily as needed (Fungus). Apply to affected area three times a day      . testosterone cypionate (DEPO-TESTOSTERONE) 200 MG/ML injection Inject 1 mL (200 mg total) into the muscle every 14 (fourteen) days.  10 mL  2  . vitamin B-12 (CYANOCOBALAMIN) 1000 MCG tablet Take 1,000 mcg by mouth daily.      . vitamin E 400 UNIT capsule Take 400 Units by mouth daily.       No current facility-administered medications for this visit.    Review of Systems Review of Systems  Constitutional: Negative for unexpected weight change.  HENT: Negative.   Eyes: Negative.   Respiratory: Positive for shortness of breath and wheezing.   Cardiovascular: Positive for leg swelling.  Gastrointestinal: Positive for abdominal pain and abdominal distention. Negative for nausea, vomiting and diarrhea.  Endocrine: Negative.   Genitourinary: Negative.   Musculoskeletal: Positive for arthralgias, back pain, gait problem, joint swelling and myalgias.  Allergic/Immunologic: Negative.   Neurological: Negative.   Hematological: Negative.   Psychiatric/Behavioral: Negative.     Blood pressure 148/86, pulse 88, temperature 97.9 F (36.6 C), temperature source Oral, resp. rate 16, height 5\' 6"  (1.676 m), weight 274 lb 12.8 oz (124.648 kg).  Physical Exam Physical Exam  Constitutional: No distress.  HENT:  Head: Normocephalic and atraumatic.  HOH  Eyes: Pupils are equal, round, and reactive to light. No scleral icterus.  Neck: Normal range of motion. Neck supple.  Cardiovascular: Normal rate and regular rhythm.   Pulmonary/Chest: Tachypnea noted. He has wheezes.  Abdominal: There is no tenderness. A hernia is present. Hernia confirmed positive in the ventral area.    Lymphadenopathy:    He has no cervical  adenopathy.  Neurological: He is alert.  Skin: Skin is warm and dry.  Psychiatric: He has a normal mood and affect. His behavior is normal. Judgment and thought content normal.    Data Reviewed CLINICAL DATA: Hernia. Worsening pain. Previous left nephrectomy  for renal cell carcinoma.  EXAM:  CT ABDOMEN AND PELVIS WITHOUT CONTRAST  TECHNIQUE:  Multidetector CT imaging of the abdomen and pelvis was performed  following the standard protocol without intravenous contrast.  COMPARISON: 12/26/2013  FINDINGS:  Minimal linear scarring  or atelectasis in the visualized lung bases.  Surgical clips in the gallbladder fossa and left nephrectomy bed.  Unremarkable liver, spleen, adrenal glands, right kidney, pancreas.  Unenhanced CT was performed per clinician order. Lack of IV contrast  limits sensitivity and specificity, especially for evaluation of  abdominal/pelvic solid viscera. Stable 15 mm portacaval node.  Numerous subcentimeter left para-aortic and aortocaval nodes are  stable. A 12 mm left para-aortic node image 44/2 stable. Stomach  distended by ingested material. Small bowel and colon nondilated.  Ventral hernia mesh. There is a large infraumbilical ventral hernia  containing a portion of the urinary bladder and loops of small bowel  and colon without evidence of obstruction or strangulation. An  curvilinear metallic density projects in the lumen of the urinary  bladder near the dome as before. Mildly enlarged right external  iliac chain lymph nodes stable. No ascites. No free air.  Degenerative disc disease L4-5 and L5-S1.  IMPRESSION:  1. Large infra umbilical ventral hernia containing loops of small  large bowel and a portion of the urinary bladder, without  obstruction or strangulation.  2. Right pelvic and retroperitoneal adenopathy post nephrectomy.  Recommend correlation with any more remote imaging studies to assess  stability, versus PET-CT to exclude metastatic disease  in the  setting of history of renal cell carcinoma post left nephrectomy.  Electronically Signed  By: Arne Cleveland M.D.  On: 01/09/2014 19:08   Assessment    Recurrent incisional ventral hernia with increasing pain  Loss of abdominal wall domain  Multiple previous incisional hernia repairs with failure  Patient Active Problem List   Diagnosis Date Noted  . Recurrent ventral incisional hernia 01/21/2014  . Abdominal wall hernia 11/19/2013  . Intertrigo 11/19/2013  . Acute sinusitis 09/18/2013  . Hand dermatitis 09/18/2013  . Hypothyroidism 07/17/2013  . Essential hypertension, malignant 07/17/2013  . Chronic kidney disease (CKD), stage IV (severe) 07/17/2013  . Obesity, unspecified 07/17/2013  . Chronic pain syndrome 07/17/2013  . COPD with asthma 07/17/2013  . GERD (gastroesophageal reflux disease) 07/17/2013      Plan    Had discussion with patient and daughter since he is hard of hearing. He has failed 3 previous repairs done at outside facility. He is short of breath at rest from his COPD. His BMI is 44. He is having more pain and after reviewing his CT scan has significant loss of domain. Recommend referral to tertiary care center for opinion about repair. Given his multiple medical problems, I feel he is a very high complication rate of well over 50% and possible mortality rate of surgery around 30-40%. Even with repair, I do not feel I can make his quality of life that much better.       [name] Ball A. 01/21/2014, 10:28 AM

## 2014-01-21 NOTE — Patient Instructions (Signed)
Hernia A hernia occurs when an internal organ pushes out through a weak spot in the abdominal wall. Hernias most commonly occur in the groin and around the navel. Hernias often can be pushed back into place (reduced). Most hernias tend to get worse over time. Some abdominal hernias can get stuck in the opening (irreducible or incarcerated hernia) and cannot be reduced. An irreducible abdominal hernia which is tightly squeezed into the opening is at risk for impaired blood supply (strangulated hernia). A strangulated hernia is a medical emergency. Because of the risk for an irreducible or strangulated hernia, surgery may be recommended to repair a hernia. CAUSES   Heavy lifting.  Prolonged coughing.  Straining to have a bowel movement.  A cut (incision) made during an abdominal surgery. HOME CARE INSTRUCTIONS   Bed rest is not required. You may continue your normal activities.  Avoid lifting more than 10 pounds (4.5 kg) or straining.  Cough gently. If you are a smoker it is best to stop. Even the best hernia repair can break down with the continual strain of coughing. Even if you do not have your hernia repaired, a cough will continue to aggravate the problem.  Do not wear anything tight over your hernia. Do not try to keep it in with an outside bandage or truss. These can damage abdominal contents if they are trapped within the hernia sac.  Eat a normal diet.  Avoid constipation. Straining over long periods of time will increase hernia size and encourage breakdown of repairs. If you cannot do this with diet alone, stool softeners may be used. SEEK IMMEDIATE MEDICAL CARE IF:   You have a fever.  You develop increasing abdominal pain.  You feel nauseous or vomit.  Your hernia is stuck outside the abdomen, looks discolored, feels hard, or is tender.  You have any changes in your bowel habits or in the hernia that are unusual for you.  You have increased pain or swelling around the  hernia.  You cannot push the hernia back in place by applying gentle pressure while lying down. MAKE SURE YOU:   Understand these instructions.  Will watch your condition.  Will get help right away if you are not doing well or get worse. Document Released: 11/22/2005 Document Revised: 02/14/2012 Document Reviewed: 07/11/2008 [facility] [address] Patient Information 2014 [place].     Will refer to [facility]  For opinion on hernia.

## 2014-01-23 ENCOUNTER — Telehealth (INDEPENDENT_AMBULATORY_CARE_PROVIDER_SITE_OTHER): Payer: Self-pay

## 2014-01-23 ENCOUNTER — Encounter (INDEPENDENT_AMBULATORY_CARE_PROVIDER_SITE_OTHER): Payer: Self-pay

## 2014-01-23 NOTE — Telephone Encounter (Signed)
Pt's referral to Dr. Alvan Dame at [facility] is scheduled for 01/31/14 at 1:00pm.  Records faxed.  Letter with appointment details sent to patient.  If patient has any questions or needs directions, they may call [phone].

## 2014-01-23 NOTE — Telephone Encounter (Signed)
Patient is aware of Appt for [facility] health 01-31-14 1p

## 2014-01-28 ENCOUNTER — Other Ambulatory Visit: Payer: Self-pay | Admitting: Family Medicine

## 2014-01-28 MED ORDER — DOXYCYCLINE HYCLATE 100 MG PO TABS: 100.0000 mg | ORAL_TABLET | Freq: Two times a day (BID) | ORAL | Status: DC

## 2014-01-28 MED ORDER — PREDNISONE 10 MG PO TABS: ORAL_TABLET | ORAL | Status: DC

## 2014-01-28 NOTE — Telephone Encounter (Signed)
Patient's wife was here in the office today he's had increased cough with production and wheezing typical of his COPD flares. She's requesting medications to start him on they're planning to have surgery for his hernias done at [facility] and he has his preop this Thursday   Will start doxycycline as well as prednisone taper

## 2014-01-30 ENCOUNTER — Other Ambulatory Visit: Payer: Self-pay | Admitting: Family Medicine

## 2014-02-06 ENCOUNTER — Other Ambulatory Visit: Payer: Self-pay | Admitting: Family Medicine

## 2014-02-07 NOTE — Telephone Encounter (Signed)
Ok to refill??  Last office visit 11/19/2013.  Last refill 01/09/2014.

## 2014-02-08 NOTE — Telephone Encounter (Signed)
Medication called to pharmacy. 

## 2014-02-08 NOTE — Telephone Encounter (Signed)
Okay to refill? 

## 2014-02-10 ENCOUNTER — Other Ambulatory Visit: Payer: Self-pay | Admitting: Family Medicine

## 2014-02-10 DIAGNOSIS — J449 Chronic obstructive pulmonary disease, unspecified: Secondary | ICD-10-CM

## 2014-02-10 DIAGNOSIS — Z01818 Encounter for other preprocedural examination: Secondary | ICD-10-CM

## 2014-02-14 ENCOUNTER — Ambulatory Visit (INDEPENDENT_AMBULATORY_CARE_PROVIDER_SITE_OTHER): Payer: Medicare Other | Admitting: Internal Medicine

## 2014-02-14 VITALS — BP 154/70 | HR 69 | Ht 66.0 in | Wt 276.0 lb

## 2014-02-14 DIAGNOSIS — I10 Essential (primary) hypertension: Secondary | ICD-10-CM

## 2014-02-14 NOTE — Patient Instructions (Addendum)
No follow up needed.  We will sent information to your doctor   Thank you for choosing [facility] !

## 2014-02-14 NOTE — Progress Notes (Signed)
HPI Patient is a 63 yo with history of morbid obesity, HTN, COPD, CKD who is referred for preop cardiac evaluation. Patient is being evaluated for ventral hernia repair. Patient hs no history of CAD In reviewing activity level he does some light house work.  He is in the garden a lot.  Digging, raking, using shovel. He also uses WE electronic games with grandchildren   He denies CP  Does have SOB which has been chronic and stable. NO palpitations.   Allergies  Allergen Reactions  . Ether Rash    Took a long time to wake up Doesn't wake up from it    Current Outpatient Prescriptions  Medication Sig Dispense Refill  . acetaminophen-codeine (TYLENOL #3) 300-30 MG per tablet TAKE 1 TABLET BY MOUTH EVERY 4 HOURS AS NEEDED FOR PAIN  90 tablet  0  . allopurinol (ZYLOPRIM) 100 MG tablet Take 100 mg by mouth at bedtime.      Marland Kitchen. amLODipine (NORVASC) 5 MG tablet Take 5 mg by mouth daily.      Marland Kitchen. aspirin 325 MG EC tablet Take 650 mg by mouth at bedtime.       Marland Kitchen. aspirin-acetaminophen-caffeine (EXCEDRIN MIGRAINE) 250-250-65 MG per tablet Take 1 tablet by mouth every 6 (six) hours as needed for migraine.      . cholecalciferol (VITAMIN D) 1000 UNITS tablet Take 1,000 Units by mouth daily.      . Clobetasol Prop Emollient Base (CLOBETASOL PROPIONATE E) 0.05 % emollient cream Apply topically 2 (two) times daily.      . clotrimazole-betamethasone (LOTRISONE) cream Apply 1 application topically 2 (two) times daily.  45 g  1  . DEXILANT 60 MG capsule TAKE 1 CAPSULE BY MOUTH EVERY DAY  30 capsule  0  . dexlansoprazole (DEXILANT) 60 MG capsule Take 60 mg by mouth at bedtime.      . diphenhydrAMINE (BENADRYL) 25 MG tablet Take 25 mg by mouth every 6 (six) hours as needed for allergies.       Marland Kitchen. doxycycline (VIBRA-TABS) 100 MG tablet Take 1 tablet (100 mg total) by mouth 2 (two) times daily.  14 tablet  0  . eletriptan (RELPAX) 20 MG tablet Take 1 tablet (20 mg total) by mouth as needed for migraine. One tablet by  mouth at onset of headache. May repeat in 2 hours if headache persists or recurs.  10 tablet  3  . ipratropium (ATROVENT HFA) 17 MCG/ACT inhaler Inhale 2 puffs into the lungs every 6 (six) hours.  1 Inhaler  11  . levalbuterol (XOPENEX HFA) 45 MCG/ACT inhaler Inhale 1-2 puffs into the lungs every 4 (four) hours as needed for wheezing.      Marland Kitchen. levothyroxine (SYNTHROID, LEVOTHROID) 50 MCG tablet Take 50 mcg by mouth daily before breakfast.      . loratadine (CLARITIN) 10 MG tablet Take 10 mg by mouth at bedtime.       . metoprolol (LOPRESSOR) 50 MG tablet TAKE 1 TABLET BY MOUTH TWICE DAILY  60 tablet  1  . mometasone (ASMANEX) 220 MCG/INH inhaler Inhale 2 puffs into the lungs 2 (two) times daily.  1 Inhaler  1  . nystatin (MYCOSTATIN/NYSTOP) 100000 UNIT/GM POWD daily as needed (Fungus). Apply to affected area three times a day      . predniSONE (DELTASONE) 10 MG tablet Take 40mg  x 3 days,30mg  x 3 days, 20mg  x 3 days, 10mg  x 3 days  30 tablet  0  . testosterone cypionate (DEPO-TESTOSTERONE) 200 MG/ML injection  Inject 1 mL (200 mg total) into the muscle every 14 (fourteen) days.  10 mL  2  . vitamin B-12 (CYANOCOBALAMIN) 1000 MCG tablet Take 1,000 mcg by mouth daily.      . vitamin E 400 UNIT capsule Take 400 Units by mouth daily.       No current facility-administered medications for this visit.    Past Medical History  Diagnosis Date  . Allergy   . COPD (chronic obstructive pulmonary disease)   . Hypertension   . Thyroid disease     hypothryoid  . Migraine   . Ulcer   . Diverticulitis   . Solar purpura   . Asthma   . Low testosterone   . GERD (gastroesophageal reflux disease)   . Arthritis   . Cancer     kidney    Past Surgical History  Procedure Laterality Date  . Kidney cyst removal  jan 2005    renal cell carcinoma  . Abdominal mesh inserted  june 2006, may 2008,dec 2010  . Knee minescus repair Left feb 2007  . Cholecystectomy  june 2007  . Colon surgery  sept 2007  .  Colonoscopy    . Hernia repair  05/2005 5/20108, 11/2009    with mesh    Family History  Problem Relation Age of Onset  . Cancer Mother     breast    History   Social History  . Marital Status: Married    Spouse Name: N/A    Number of Children: N/A  . Years of Education: N/A   Occupational History  . Not on file.   Social History Main Topics  . Smoking status: Former Smoker    Quit date: 12/06/1996  . Smokeless tobacco: Never Used  . Alcohol Use: Yes     Comment: little   . Drug Use: No  . Sexual Activity: Not on file   Other Topics Concern  . Not on file   Social History Narrative  . No narrative on file    Review of Systems:  All systems reviewed.  They are negative to the above problem except as previously stated.  Vital Signs: BP 154/70  Pulse 69  Ht 5\' 6"  (1.676 m)  Wt 276 lb (125.193 kg)  BMI 44.57 kg/m2  Physical Exam Patient is in NAD HEENT:  Normocephalic, atraumatic. EOMI, PERRLA.  Neck: JVP is normal.  No bruits.  Lungs: Some decreased airflow. No rales no wheezes.  Heart: Regular rate and rhythm. Normal S1, S2. No S3.   No significant murmurs. PMI not displaced.  Abdomen:  Supple, nontender. Ventral hernias  Extremities:   Good distal pulses throughout. No lower extremity edema.  Musculoskeletal :moving all extremities.  Neuro:   alert and oriented x3.  CN II-XII grossly intact.  EKG  :  SR.  70  Assessment and Plan:  Patient is a 63 yo being evaluated for noncardiac surgery  Patient with COPD and some SOB with activities.  Stable He is able to perform moderate gardening activities and can play with grandchildren.  These all demand greater than 4 METS of energy. EKG shows NSR>  Exam without volume overload  Overall, I think he is at low risk for a major cardiac event with planned surgery  and is OK to proceed without further cardiac testing.  Patient has appt in pulmonary soon.  PFTs scheduled.  BP is mildly elevated  Will need continued  follow up in long term  SHould improve with wt  loss   Will not schedule follow up for now in cardiology.    Dorris Carnes

## 2014-02-18 ENCOUNTER — Inpatient Hospital Stay (HOSPITAL_COMMUNITY): Payer: Medicare Other

## 2014-02-18 ENCOUNTER — Encounter (HOSPITAL_COMMUNITY): Payer: Self-pay | Admitting: Emergency Medicine

## 2014-02-18 ENCOUNTER — Telehealth: Payer: Self-pay | Admitting: *Deleted

## 2014-02-18 ENCOUNTER — Inpatient Hospital Stay (HOSPITAL_COMMUNITY)
Admission: EM | Admit: 2014-02-18 | Discharge: 2014-02-20 | DRG: 378 | Disposition: A | Discharge status: Home / Self Care | Payer: Medicare Other | Attending: Family Medicine | Admitting: Family Medicine

## 2014-02-18 DIAGNOSIS — K648 Other hemorrhoids: Secondary | ICD-10-CM | POA: Diagnosis present

## 2014-02-18 DIAGNOSIS — K219 Gastro-esophageal reflux disease without esophagitis: Secondary | ICD-10-CM

## 2014-02-18 DIAGNOSIS — K922 Gastrointestinal hemorrhage, unspecified: Secondary | ICD-10-CM

## 2014-02-18 DIAGNOSIS — Z6841 Body Mass Index (BMI) 40.0 and over, adult: Secondary | ICD-10-CM

## 2014-02-18 DIAGNOSIS — J45901 Unspecified asthma with (acute) exacerbation: Secondary | ICD-10-CM

## 2014-02-18 DIAGNOSIS — E039 Hypothyroidism, unspecified: Secondary | ICD-10-CM

## 2014-02-18 DIAGNOSIS — G894 Chronic pain syndrome: Secondary | ICD-10-CM | POA: Diagnosis present

## 2014-02-18 DIAGNOSIS — N184 Chronic kidney disease, stage 4 (severe): Secondary | ICD-10-CM | POA: Diagnosis present

## 2014-02-18 DIAGNOSIS — K439 Ventral hernia without obstruction or gangrene: Secondary | ICD-10-CM | POA: Diagnosis present

## 2014-02-18 DIAGNOSIS — Z85528 Personal history of other malignant neoplasm of kidney: Secondary | ICD-10-CM

## 2014-02-18 DIAGNOSIS — J019 Acute sinusitis, unspecified: Secondary | ICD-10-CM

## 2014-02-18 DIAGNOSIS — K625 Hemorrhage of anus and rectum: Secondary | ICD-10-CM

## 2014-02-18 DIAGNOSIS — J4489 Other specified chronic obstructive pulmonary disease: Secondary | ICD-10-CM

## 2014-02-18 DIAGNOSIS — Z87891 Personal history of nicotine dependence: Secondary | ICD-10-CM

## 2014-02-18 DIAGNOSIS — Z905 Acquired absence of kidney: Secondary | ICD-10-CM

## 2014-02-18 DIAGNOSIS — K5731 Diverticulosis of large intestine without perforation or abscess with bleeding: Principal | ICD-10-CM | POA: Diagnosis present

## 2014-02-18 DIAGNOSIS — N183 Chronic kidney disease, stage 3 unspecified: Secondary | ICD-10-CM | POA: Diagnosis present

## 2014-02-18 DIAGNOSIS — J449 Chronic obstructive pulmonary disease, unspecified: Secondary | ICD-10-CM

## 2014-02-18 DIAGNOSIS — E669 Obesity, unspecified: Secondary | ICD-10-CM | POA: Diagnosis present

## 2014-02-18 DIAGNOSIS — K432 Incisional hernia without obstruction or gangrene: Secondary | ICD-10-CM

## 2014-02-18 DIAGNOSIS — L304 Erythema intertrigo: Secondary | ICD-10-CM

## 2014-02-18 DIAGNOSIS — Z7982 Long term (current) use of aspirin: Secondary | ICD-10-CM

## 2014-02-18 DIAGNOSIS — I129 Hypertensive chronic kidney disease with stage 1 through stage 4 chronic kidney disease, or unspecified chronic kidney disease: Secondary | ICD-10-CM | POA: Diagnosis present

## 2014-02-18 DIAGNOSIS — H9190 Unspecified hearing loss, unspecified ear: Secondary | ICD-10-CM | POA: Diagnosis present

## 2014-02-18 DIAGNOSIS — D72829 Elevated white blood cell count, unspecified: Secondary | ICD-10-CM | POA: Diagnosis present

## 2014-02-18 DIAGNOSIS — J441 Chronic obstructive pulmonary disease with (acute) exacerbation: Secondary | ICD-10-CM | POA: Diagnosis present

## 2014-02-18 DIAGNOSIS — L309 Dermatitis, unspecified: Secondary | ICD-10-CM

## 2014-02-18 DIAGNOSIS — I10 Essential (primary) hypertension: Secondary | ICD-10-CM

## 2014-02-18 DIAGNOSIS — D62 Acute posthemorrhagic anemia: Secondary | ICD-10-CM | POA: Diagnosis present

## 2014-02-18 HISTORY — DX: Ventral hernia without obstruction or gangrene: K43.9

## 2014-02-18 HISTORY — DX: Diverticulosis of intestine, part unspecified, without perforation or abscess without bleeding: K57.90

## 2014-02-18 LAB — CBC WITH DIFFERENTIAL/PLATELET
BASOS ABS: 0 10*3/uL (ref 0.0–0.1)
Band Neutrophils: 0 % (ref 0–10)
Basophils Relative: 0 % (ref 0–1)
Blasts: 0 %
Eosinophils Absolute: 0.6 10*3/uL (ref 0.0–0.7)
Eosinophils Relative: 4 % (ref 0–5)
HCT: 43.3 % (ref 39.0–52.0)
Hemoglobin: 13.8 g/dL (ref 13.0–17.0)
Lymphocytes Relative: 49 % — ABNORMAL HIGH (ref 12–46)
Lymphs Abs: 7.1 10*3/uL — ABNORMAL HIGH (ref 0.7–4.0)
MCH: 27.7 pg (ref 26.0–34.0)
MCHC: 31.9 g/dL (ref 30.0–36.0)
MCV: 86.8 fL (ref 78.0–100.0)
MYELOCYTES: 0 %
Metamyelocytes Relative: 0 %
Monocytes Absolute: 0.7 10*3/uL (ref 0.1–1.0)
Monocytes Relative: 5 % (ref 3–12)
NEUTROS ABS: 6 10*3/uL (ref 1.7–7.7)
NEUTROS PCT: 42 % — AB (ref 43–77)
NRBC: 0 /100{WBCs}
PLATELETS: 222 10*3/uL (ref 150–400)
Promyelocytes Absolute: 0 %
RBC: 4.99 MIL/uL (ref 4.22–5.81)
RDW: 15 % (ref 11.5–15.5)
WBC: 14.4 10*3/uL — ABNORMAL HIGH (ref 4.0–10.5)

## 2014-02-18 LAB — CBC
HCT: 38 % — ABNORMAL LOW (ref 39.0–52.0)
HEMATOCRIT: 40 % (ref 39.0–52.0)
HEMOGLOBIN: 12.6 g/dL — AB (ref 13.0–17.0)
HEMOGLOBIN: 12.1 g/dL — AB (ref 13.0–17.0)
MCH: 27.5 pg (ref 26.0–34.0)
MCH: 27.8 pg (ref 26.0–34.0)
MCHC: 31.5 g/dL (ref 30.0–36.0)
MCHC: 31.8 g/dL (ref 30.0–36.0)
MCV: 87.3 fL (ref 78.0–100.0)
MCV: 87.4 fL (ref 78.0–100.0)
Platelets: 198 10*3/uL (ref 150–400)
Platelets: 207 10*3/uL (ref 150–400)
RBC: 4.58 MIL/uL (ref 4.22–5.81)
RBC: 4.35 MIL/uL (ref 4.22–5.81)
RDW: 15.1 % (ref 11.5–15.5)
RDW: 15.2 % (ref 11.5–15.5)
WBC: 13.6 10*3/uL — ABNORMAL HIGH (ref 4.0–10.5)
WBC: 14.4 10*3/uL — ABNORMAL HIGH (ref 4.0–10.5)

## 2014-02-18 LAB — BASIC METABOLIC PANEL
BUN: 17 mg/dL (ref 6–23)
CO2: 25 mEq/L (ref 19–32)
Calcium: 8.7 mg/dL (ref 8.4–10.5)
Chloride: 102 mEq/L (ref 96–112)
Creatinine, Ser: 1.39 mg/dL — ABNORMAL HIGH (ref 0.50–1.35)
GFR, EST AFRICAN AMERICAN: 61 mL/min — AB (ref >90)
GFR, EST NON AFRICAN AMERICAN: 52 mL/min — AB (ref >90)
GLUCOSE: 170 mg/dL — AB (ref 70–99)
POTASSIUM: 4.8 meq/L (ref 3.7–5.3)
SODIUM: 136 meq/L — AB (ref 137–147)

## 2014-02-18 LAB — TYPE AND SCREEN
ABO/RH(D): A POS
ANTIBODY SCREEN: NEGATIVE

## 2014-02-18 LAB — TSH: TSH: 1.521 u[IU]/mL (ref 0.350–4.500)

## 2014-02-18 MED ORDER — CLOTRIMAZOLE-BETAMETHASONE 1-0.05 % EX CREA
1.0000 "application " | TOPICAL_CREAM | Freq: Two times a day (BID) | CUTANEOUS | Status: DC
  Administered 2014-02-18 – 2014-02-20 (×4): 1 via TOPICAL
  Filled 2014-02-18 (×4): qty 15

## 2014-02-18 MED ORDER — SODIUM CHLORIDE 0.9 % IV SOLN
INTRAVENOUS | Status: DC
  Administered 2014-02-18 – 2014-02-19 (×3): via INTRAVENOUS

## 2014-02-18 MED ORDER — IPRATROPIUM BROMIDE 0.02 % IN SOLN: 0.5000 mg | RESPIRATORY_TRACT | Status: DC

## 2014-02-18 MED ORDER — ALBUTEROL SULFATE (2.5 MG/3ML) 0.083% IN NEBU: 2.5000 mg | INHALATION_SOLUTION | RESPIRATORY_TRACT | Status: DC | PRN

## 2014-02-18 MED ORDER — MORPHINE SULFATE 2 MG/ML IJ SOLN
1.0000 mg | INTRAMUSCULAR | Status: DC | PRN
  Administered 2014-02-18 – 2014-02-20 (×13): 1 mg via INTRAVENOUS
  Filled 2014-02-18 (×13): qty 1

## 2014-02-18 MED ORDER — ACETAMINOPHEN 325 MG PO TABS: 650.0000 mg | ORAL_TABLET | Freq: Four times a day (QID) | ORAL | Status: DC | PRN

## 2014-02-18 MED ORDER — ALBUTEROL SULFATE (2.5 MG/3ML) 0.083% IN NEBU: 2.5000 mg | INHALATION_SOLUTION | RESPIRATORY_TRACT | Status: DC

## 2014-02-18 MED ORDER — ONDANSETRON HCL 4 MG PO TABS: 4.0000 mg | ORAL_TABLET | Freq: Four times a day (QID) | ORAL | Status: DC | PRN

## 2014-02-18 MED ORDER — LORATADINE 10 MG PO TABS
10.0000 mg | ORAL_TABLET | Freq: Every day | ORAL | Status: DC
  Administered 2014-02-18 – 2014-02-19 (×2): 10 mg via ORAL
  Filled 2014-02-18 (×2): qty 1

## 2014-02-18 MED ORDER — PANTOPRAZOLE SODIUM 40 MG IV SOLR
40.0000 mg | INTRAVENOUS | Status: DC
  Administered 2014-02-18: 40 mg via INTRAVENOUS
  Filled 2014-02-18: qty 40

## 2014-02-18 MED ORDER — ONDANSETRON HCL 4 MG/2ML IJ SOLN
4.0000 mg | Freq: Four times a day (QID) | INTRAMUSCULAR | Status: DC | PRN
  Administered 2014-02-18 – 2014-02-20 (×7): 4 mg via INTRAVENOUS
  Filled 2014-02-18 (×7): qty 2

## 2014-02-18 MED ORDER — MORPHINE SULFATE 4 MG/ML IJ SOLN
4.0000 mg | Freq: Once | INTRAMUSCULAR | Status: AC
  Administered 2014-02-18: 4 mg via INTRAVENOUS
  Filled 2014-02-18: qty 1

## 2014-02-18 MED ORDER — LEVOTHYROXINE SODIUM 50 MCG PO TABS
50.0000 ug | ORAL_TABLET | Freq: Every day | ORAL | Status: DC
  Administered 2014-02-19 – 2014-02-20 (×2): 50 ug via ORAL
  Filled 2014-02-18 (×2): qty 1

## 2014-02-18 MED ORDER — IPRATROPIUM-ALBUTEROL 0.5-2.5 (3) MG/3ML IN SOLN
3.0000 mL | RESPIRATORY_TRACT | Status: DC
  Administered 2014-02-18 – 2014-02-19 (×6): 3 mL via RESPIRATORY_TRACT
  Filled 2014-02-18 (×6): qty 3

## 2014-02-18 MED ORDER — PANTOPRAZOLE SODIUM 40 MG IV SOLR
40.0000 mg | Freq: Once | INTRAVENOUS | Status: AC
  Administered 2014-02-18: 40 mg via INTRAVENOUS
  Filled 2014-02-18: qty 40

## 2014-02-18 MED ORDER — SODIUM CHLORIDE 0.9 % IV BOLUS (SEPSIS)
500.0000 mL | Freq: Once | INTRAVENOUS | Status: AC
  Administered 2014-02-18: 500 mL via INTRAVENOUS

## 2014-02-18 MED ORDER — ALLOPURINOL 100 MG PO TABS
100.0000 mg | ORAL_TABLET | Freq: Every day | ORAL | Status: DC
  Administered 2014-02-18 – 2014-02-19 (×2): 100 mg via ORAL
  Filled 2014-02-18 (×2): qty 1

## 2014-02-18 MED ORDER — ONDANSETRON HCL 4 MG/2ML IJ SOLN
4.0000 mg | Freq: Once | INTRAMUSCULAR | Status: AC
  Administered 2014-02-18: 4 mg via INTRAVENOUS
  Filled 2014-02-18: qty 2

## 2014-02-18 MED ORDER — ACETAMINOPHEN 650 MG RE SUPP: 650.0000 mg | Freq: Four times a day (QID) | RECTAL | Status: DC | PRN

## 2014-02-18 MED ORDER — ALUM & MAG HYDROXIDE-SIMETH 200-200-20 MG/5ML PO SUSP: 30.0000 mL | Freq: Four times a day (QID) | ORAL | Status: DC | PRN

## 2014-02-18 MED ORDER — NYSTATIN 100000 UNIT/GM EX POWD
Freq: Every day | CUTANEOUS | Status: DC | PRN
  Filled 2014-02-18: qty 15

## 2014-02-18 MED ORDER — PANTOPRAZOLE SODIUM 40 MG IV SOLR
40.0000 mg | Freq: Two times a day (BID) | INTRAVENOUS | Status: DC
  Administered 2014-02-18: 40 mg via INTRAVENOUS
  Filled 2014-02-18: qty 40

## 2014-02-18 NOTE — H&P (Signed)
[facility] History and Physical  Esvin Hnat YQM:[id number] DOB: 08-22-1951 DOA: 02/18/2014  Referring physician:  PCP: Vic Blackbird, MD   Chief Complaint: Bright Red blood per rectum  HPI: [name] Ball is a 63 y.o. male with past medical history that includes COPD not on home oxygen, hypertension, diverticulosis, GERD, multiple abdominal hernias status post several repairs, renal cancer status post nephrectomy 2005 presents with chief complaint of bright red blood per rectum. Information is obtained from the patient and his wife who interprets via sign language as patient is deaf. They report he was in his usual state of health until 3 AM this morning when he awakened eating to go to the bathroom. He reports he expelled bright red blood per rectum with very little stool. He states "I practically filled up the toilet". Associated symptoms include abdominal pain, nausea with vomiting some dizziness. Denies coffee ground emesis.  Reports the pain is epigastric nonradiating. He states he takes one aspirin 325 mg daily. He denies NSAID given his renal function. He reports having a colonoscopy and endoscopy in 2010 which revealed diverticulosis, hemorrhoids and gastric ulcer. In addition he has been more short of breath than usual but attributes this to the fact he developed a cold over the last 3 days from his grandchild. Is not on home oxygen and not on home nebulizers but is on inhalers. Initial evaluation in the emergency report yields complete blood count with hemoglobin of 52.8 basic metabolic panel creatinine of 13.9. He is hemodynamically stable and afebrile. He is not hypoxic. We are asked to admit  Review of Systems:  10 point review of systems completed and all systems are negative except as   Past Medical History  Diagnosis Date  . Allergy   . COPD (chronic obstructive pulmonary disease)   . Hypertension   . Thyroid disease     hypothryoid  . Migraine   . Ulcer   .  Diverticulitis   . Solar purpura   . Asthma   . Low testosterone   . GERD (gastroesophageal reflux disease)   . Arthritis   . Cancer     kidney   Past Surgical History  Procedure Laterality Date  . Kidney cyst removal  jan 2005    renal cell carcinoma  . Abdominal mesh inserted  june 2006, may 2008,dec 2010  . Knee minescus repair Left feb 2007  . Cholecystectomy  june 2007  . Colon surgery  sept 2007  . Colonoscopy    . Hernia repair  05/2005 5/20108, 11/2009    with mesh   Social History:  reports that he quit smoking about 17 years ago. He has never used smokeless tobacco. He reports that he drinks alcohol. He reports that he does not use illicit drugs. He is a retired Administrator. He lives at home with his wife. He recently relocated to [facility] from [place]  . Ether Rash    Took a long time to wake up Doesn't wake up from it    Family History  Problem Relation Age of Onset  . Cancer Mother     breast     Prior to Admission medications   Medication Sig Start Date End Date Taking? Authorizing Provider  acetaminophen-codeine (TYLENOL #3) 300-30 MG per tablet TAKE 1 TABLET BY MOUTH EVERY 4 HOURS AS NEEDED FOR PAIN   Yes Alycia Rossetti, MD  allopurinol (ZYLOPRIM) 100 MG tablet Take 100 mg by mouth at bedtime.  Yes Historical Provider, MD  amLODipine (NORVASC) 5 MG tablet Take 5 mg by mouth daily.   Yes Historical Provider, MD  aspirin EC 81 MG tablet Take 81 mg by mouth daily.   Yes Historical Provider, MD  aspirin-acetaminophen-caffeine (EXCEDRIN MIGRAINE) [phone] MG per tablet Take 1 tablet by mouth every 6 (six) hours as needed for migraine.   Yes Historical Provider, MD  cholecalciferol (VITAMIN D) 1000 UNITS tablet Take 1,000 Units by mouth daily.   Yes Historical Provider, MD  clobetasol cream (TEMOVATE) 0.34 % Apply 1 application topically 2 (two) times daily. 01/08/14  Yes Historical Provider, MD    clotrimazole-betamethasone (LOTRISONE) cream Apply 1 application topically 2 (two) times daily. 11/19/13  Yes Alycia Rossetti, MD  DEXILANT 60 MG capsule TAKE 1 CAPSULE BY MOUTH EVERY DAY   Yes Alycia Rossetti, MD  diphenhydrAMINE (BENADRYL) 25 MG tablet Take 25 mg by mouth every 6 (six) hours as needed for allergies.    Yes Historical Provider, MD  eletriptan (RELPAX) 20 MG tablet Take 1 tablet (20 mg total) by mouth as needed for migraine. One tablet by mouth at onset of headache. May repeat in 2 hours if headache persists or recurs. 01/16/14  Yes Alycia Rossetti, MD  ipratropium (ATROVENT HFA) 17 MCG/ACT inhaler Inhale 2 puffs into the lungs every 6 (six) hours. 11/23/13  Yes Alycia Rossetti, MD  levalbuterol [facility] HFA) 45 MCG/ACT inhaler Inhale 1-2 puffs into the lungs every 4 (four) hours as needed for wheezing.   Yes Historical Provider, MD  levothyroxine (SYNTHROID, LEVOTHROID) 50 MCG tablet Take 50 mcg by mouth daily before breakfast.   Yes Historical Provider, MD  loratadine (CLARITIN) 10 MG tablet Take 10 mg by mouth at bedtime.    Yes Historical Provider, MD  metoprolol (LOPRESSOR) 50 MG tablet TAKE 1 TABLET BY MOUTH TWICE DAILY 01/14/14  Yes Alycia Rossetti, MD  mometasone [facility]) 220 MCG/INH inhaler Inhale 2 puffs into the lungs 2 (two) times daily. 10/31/13  Yes Alycia Rossetti, MD  nystatin (MYCOSTATIN/NYSTOP) 100000 UNIT/GM POWD daily as needed (Fungus). Apply to affected area three times a day 11/19/13  Yes Alycia Rossetti, MD  testosterone cypionate (DEPO-TESTOSTERONE) 200 MG/ML injection Inject 1 mL (200 mg total) into the muscle every 14 (fourteen) days. 08/03/13  Yes Alycia Rossetti, MD  vitamin B-12 (CYANOCOBALAMIN) 1000 MCG tablet Take 1,000 mcg by mouth daily.   Yes Historical Provider, MD  vitamin E 400 UNIT capsule Take 400 Units by mouth daily.   Yes Historical Provider, MD   Physical Exam: Filed Vitals:   02/18/14 1238  BP: 113/68  Pulse: 90  Temp: 97.5 F  (36.4 C)  Resp: 16    BP 113/68  Pulse 90  Temp(Src) 97.5 F (36.4 C) (Oral)  Resp 16  Ht 5\' 6"  (1.676 m)  Wt 124.2 kg (273 lb 13 oz)  BMI 44.22 kg/m2  SpO2 97%  General:  Appears calm and comfortable, obese looks a little older than his stated age Eyes: PERRL, normal lids, irises & conjunctiva ENT: grossly normal hearing, lips & tongue poor dentition Neck: no LAD, masses or thyromegaly Cardiovascular: RRR, no m/r/g. No LE edema. Respiratory: Mild increased work of breathing with conversation. Breath sounds are diminished throughout. Mild expiratory wheeze diffusely.. Abdomen: soft,  positive bowel sounds throughout but diminished. mild tenderness throughout. No guarding no rebounding Skin: no rash or induration seen on limited exam Musculoskeletal: grossly normal tone BUE/BLE Psychiatric: grossly normal mood  and affect, speech fluent and appropriate Neurologic: grossly non-focal.speech clear facial symmetry patient is deaf           Labs on Admission:  Basic Metabolic Panel:  Recent Labs Lab 02/18/14 0924  NA 136*  K 4.8  CL 102  CO2 25  GLUCOSE 170*  BUN 17  CREATININE 1.39*  CALCIUM 8.7   Liver Function Tests: No results found for this basename: AST, ALT, ALKPHOS, BILITOT, PROT, ALBUMIN,  in the last 168 hours No results found for this basename: LIPASE, AMYLASE,  in the last 168 hours No results found for this basename: AMMONIA,  in the last 168 hours CBC:  Recent Labs Lab 02/18/14 0924  WBC 14.4*  NEUTROABS 6.0  HGB 13.8  HCT 43.3  MCV 86.8  PLT 222   Cardiac Enzymes: No results found for this basename: CKTOTAL, CKMB, CKMBINDEX, TROPONINI,  in the last 168 hours  BNP (last 3 results) No results found for this basename: PROBNP,  in the last 8760 hours CBG: No results found for this basename: GLUCAP,  in the last 168 hours  Radiological Exams on Admission: No results found.  EKG:    Assessment/Plan Principal Problem:   BRBPR (bright red  blood per rectum): Patient with history of diverticulosis and hemorrhoids and gastric ulcer. No NSAIDs. Takes one aspirin daily. Concern for GI bleed. Hemoglobin stable. Will admit. Will start protonic transvenously. Will keep him n.p.o. until gastroenterology consult. Will obtain serial CBCs to monitor hemoglobin. Currently hemoglobin 13.8. No episodes of bright red blood paretic complete since presentation. Active Problems:  COPD with asthma: May have mild exacerbation. Will provide nebs every 4 hours. Will obtain a chest x-ray. Currently he is not hypoxic on room air. Will monitor and provide oxygen supplementation as needed.   Essential hypertension, malignant: Currently blood pressure somewhat soft. Home medications include amlodipine, metoprolol. We will hold these for now. Will monitor his vital signs closely.    Hypothyroidism: Obtain a TSH. Continue home medication      Chronic kidney disease (CKD), stage IV (severe): Status post nephrectomy 2005- secondary to renal cancer. Will monitor renal output. Creatinine currently 1.3 which according to his chart appears to be close to his baseline.    Chronic pain syndrome: Appears stable at baseline.     GERD (gastroesophageal reflux disease): Will provide PPI.    Abdominal wall hernia: Patient seen by surgery at [facility]. Will need surgical repair. Has seen cardiology for clearance and now is planning for pulmonary evaluation from Dr. Luan Pulling for clearance as well.    Code Status: full Family Communication: wife at bedside Disposition Plan: home when ready  Time spent: 53 minutes  [facility] Pager [phone]

## 2014-02-18 NOTE — Telephone Encounter (Signed)
No vaccine noted.   MD please advise.

## 2014-02-18 NOTE — ED Notes (Signed)
Pt co rectal bleeding x 4 hours, pt staetes blood started bright red and then noticed dark tar looking stool after

## 2014-02-18 NOTE — H&P (Signed)
Patient seen and examined. Above note reviewed.   The patient has been admitted with bright red blood per rectum. He denies any nonsteroidal anti-inflammatory use. He does take 1 aspirin daily. Reports having a colonoscopy and EGD done in [place] in 2010. By history, it appears that he may have a diverticular bleed. GI consultation has been requested. Continue Protonix for now. Hemoglobin appears to be adequate. 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 We'll continue to monitor for further bleeding. Await GI input. We'll keep the patient n.p.o. after midnight in case procedures are planned for tomorrow.  [name] Ball

## 2014-02-18 NOTE — Telephone Encounter (Signed)
Added to EPIC chart and pt wife made aware.

## 2014-02-18 NOTE — ED Notes (Signed)
Pt is deaf but read lips very well.

## 2014-02-18 NOTE — ED Provider Notes (Signed)
CSN: [id number]     Arrival date & time 02/18/14  0901 History   This chart was scribed for Sharyon Cable, MD by Era Bumpers, ED scribe. This patient was seen in room APA11/APA11 and the patient's care was started at 0901.  Chief Complaint  Patient presents with  . Rectal Bleeding   The history is provided by the patient. No language interpreter was used.   HPI Comments: Cari Vandeberg is a 63 y.o. male who presents to the Emergency Department after he had an episode this morning in which he passed bright red blood this AM x3 hours ago and then an hour ago passed dark brown blood; there was no stool in either episode; just blood. He reports hx of hemorrhoids and had bright red blood but did not have dark brown blood before. He reports associated nausea, dizziness and abdominal pain. He denies any hematemesis. He takes no blood thinner medicines. He states he would not like to have any transfusions should he need any. He states hx of COPD.  Past Medical History  Diagnosis Date  . Allergy   . COPD (chronic obstructive pulmonary disease)   . Hypertension   . Thyroid disease     hypothryoid  . Migraine   . Ulcer   . Diverticulitis   . Solar purpura   . Asthma   . Low testosterone   . GERD (gastroesophageal reflux disease)   . Arthritis   . Cancer     kidney   Past Surgical History  Procedure Laterality Date  . Kidney cyst removal  jan 2005    renal cell carcinoma  . Abdominal mesh inserted  june 2006, may 2008,dec 2010  . Knee minescus repair Left feb 2007  . Cholecystectomy  june 2007  . Colon surgery  sept 2007  . Colonoscopy    . Hernia repair  05/2005 5/20108, 11/2009    with mesh   Family History  Problem Relation Age of Onset  . Cancer Mother     breast   History  Substance Use Topics  . Smoking status: Former Smoker    Quit date: 12/06/1996  . Smokeless tobacco: Never Used  . Alcohol Use: Yes     Comment: little     Review of Systems  Constitutional:  Negative for fever and chills.  Respiratory: Negative for cough and shortness of breath.   Cardiovascular: Negative for chest pain.  Gastrointestinal: Positive for nausea, abdominal pain and blood in stool. Negative for vomiting and diarrhea.  Musculoskeletal: Negative for back pain.  Neurological: Positive for weakness.  All other systems reviewed and are negative.   Allergies  Ether  Home Medications   Current Outpatient Rx  Name  Route  Sig  Dispense  Refill  . acetaminophen-codeine (TYLENOL #3) 300-30 MG per tablet      TAKE 1 TABLET BY MOUTH EVERY 4 HOURS AS NEEDED FOR PAIN   90 tablet   0   . allopurinol (ZYLOPRIM) 100 MG tablet   Oral   Take 100 mg by mouth at bedtime.         Marland Kitchen amLODipine (NORVASC) 5 MG tablet   Oral   Take 5 mg by mouth daily.         Marland Kitchen aspirin 325 MG EC tablet   Oral   Take 650 mg by mouth at bedtime.          Marland Kitchen aspirin-acetaminophen-caffeine (EXCEDRIN MIGRAINE) 250-250-65 MG per tablet   Oral   Take  1 tablet by mouth every 6 (six) hours as needed for migraine.         . cholecalciferol (VITAMIN D) 1000 UNITS tablet   Oral   Take 1,000 Units by mouth daily.         . Clobetasol Prop Emollient Base (CLOBETASOL PROPIONATE E) 0.05 % emollient cream   Topical   Apply topically 2 (two) times daily.         . clotrimazole-betamethasone (LOTRISONE) cream   Topical   Apply 1 application topically 2 (two) times daily.   45 g   1   . DEXILANT 60 MG capsule      TAKE 1 CAPSULE BY MOUTH EVERY DAY   30 capsule   0   . dexlansoprazole (DEXILANT) 60 MG capsule   Oral   Take 60 mg by mouth at bedtime.         . diphenhydrAMINE (BENADRYL) 25 MG tablet   Oral   Take 25 mg by mouth every 6 (six) hours as needed for allergies.          Marland Kitchen doxycycline (VIBRA-TABS) 100 MG tablet   Oral   Take 1 tablet (100 mg total) by mouth 2 (two) times daily.   14 tablet   0   . eletriptan (RELPAX) 20 MG tablet   Oral   Take 1 tablet (20  mg total) by mouth as needed for migraine. One tablet by mouth at onset of headache. May repeat in 2 hours if headache persists or recurs.   10 tablet   3   . ipratropium (ATROVENT HFA) 17 MCG/ACT inhaler   Inhalation   Inhale 2 puffs into the lungs every 6 (six) hours.   1 Inhaler   11   . levalbuterol (XOPENEX HFA) 45 MCG/ACT inhaler   Inhalation   Inhale 1-2 puffs into the lungs every 4 (four) hours as needed for wheezing.         Marland Kitchen levothyroxine (SYNTHROID, LEVOTHROID) 50 MCG tablet   Oral   Take 50 mcg by mouth daily before breakfast.         . loratadine (CLARITIN) 10 MG tablet   Oral   Take 10 mg by mouth at bedtime.          . metoprolol (LOPRESSOR) 50 MG tablet      TAKE 1 TABLET BY MOUTH TWICE DAILY   60 tablet   1     NO FURTHER REFILLS UNTIL PT IS SEEN   . mometasone (ASMANEX) 220 MCG/INH inhaler   Inhalation   Inhale 2 puffs into the lungs 2 (two) times daily.   1 Inhaler   1   . nystatin (MYCOSTATIN/NYSTOP) 100000 UNIT/GM POWD      daily as needed (Fungus). Apply to affected area three times a day         . predniSONE (DELTASONE) 10 MG tablet      Take 40mg  x 3 days,30mg  x 3 days, 20mg  x 3 days, 10mg  x 3 days   30 tablet   0   . testosterone cypionate (DEPO-TESTOSTERONE) 200 MG/ML injection   Intramuscular   Inject 1 mL (200 mg total) into the muscle every 14 (fourteen) days.   10 mL   2   . vitamin B-12 (CYANOCOBALAMIN) 1000 MCG tablet   Oral   Take 1,000 mcg by mouth daily.         . vitamin E 400 UNIT capsule   Oral   Take 400 Units  by mouth daily.          Triage Vitals: BP 133/78  Pulse 94  Temp(Src) 98 F (36.7 C) (Oral)  Resp 18  Ht 5\' 8"  (1.727 m)  Wt 276 lb (125.193 kg)  BMI 41.98 kg/m2  SpO2 97%  Physical Exam CONSTITUTIONAL: Well developed/well nourished HEAD: Normocephalic/atraumatic EYES: EOMI/PERRL ENMT: Mucous membranes moist NECK: supple no meningeal signs SPINE:entire spine nontender CV: S1/S2  noted, no murmurs/rubs/gallops noted LUNGS: Lungs are clear to auscultation bilaterally, no apparent distress ABDOMEN: soft, nontender, no rebound or guarding, large hernia, lower abdomen, non tender and no erythema and no significant tenderness RECTAL: Grossly bloody stool, no hemorrhoids, chaperone present.  GU:no cva tenderness NEURO: Pt is awake/alert, moves all extremitiesx4 EXTREMITIES: pulses normal, full ROM SKIN: warm, color normal PSYCH: no abnormalities of mood noted  ED Course  Procedures  DIAGNOSTIC STUDIES: Oxygen Saturation is 97% on room air, normal by my interpretation.    COORDINATION OF CARE: At 930 AM Discussed treatment plan with patient which includes pain/nausea medicine, blood work. Patient agrees.  10:29 AM Will admit for GI bleed Pt stable He is a Jehovah's Witness and refuses blood products.  This was endorsed to dr Roderic Palau for admission   Labs Review Labs Reviewed  CBC WITH DIFFERENTIAL - Abnormal; Notable for the following:    WBC 14.4 (*)    All other components within normal limits  BASIC METABOLIC PANEL  POC OCCULT BLOOD, ED  TYPE AND SCREEN    MDM   Final diagnoses:  GI bleed    Nursing notes including past medical history and social history reviewed and considered in documentation Labs/vital reviewed and considered  I personally performed the services described in this documentation, which was scribed in my presence. The recorded information has been reviewed and is accurate.      Sharyon Cable, MD 02/18/14 1030

## 2014-02-18 NOTE — Progress Notes (Signed)
Pt came to floor from ED. Pt is in NAD, VS are stable, only complains of nausea. NP is aware. Will continue to monitor.

## 2014-02-18 NOTE — ED Notes (Signed)
Pt presents with c/o rectal bleeding that began last night. He states that he had small amounts of blood in his stool last night and large amounts of blood in his stool on 2 occurences this morning. He stated "I filled the toilet up with blood." Patient became concerned and reported to the ED after the large amount of blood this morning. He stated the first occurrence of blood was bright red and the second was darker red in color. Pt also c/o some abdominal pain that has gotten progressively worse throughout the morning. He has had episodes of nausea and dry heaving. MM are wet. No apparent distress noted. Pt is also deaf but can hear some out of his right ear and can also read lips.

## 2014-02-18 NOTE — Telephone Encounter (Signed)
Pt had Pneumonia vaccine 08/01/09 at his last PCP, please call pt wife or hospital he does not need again, we will put into Epic

## 2014-02-18 NOTE — Telephone Encounter (Signed)
Message copied by Sheral Flow on Mon Feb 18, 2014  3:52 PM ------      Message from: [facility], Judson Roch J      Created: Mon Feb 18, 2014  3:28 PM      Contact: [phone]       Please call his wife Belenda Cruise- The hospital needs to know if Mr. Bifulco has had a pneumonia shot and if not would Dr. Buelah Manis      Recommend getting one ------

## 2014-02-19 ENCOUNTER — Encounter (HOSPITAL_COMMUNITY): Payer: Self-pay | Admitting: Gastroenterology

## 2014-02-19 ENCOUNTER — Inpatient Hospital Stay (HOSPITAL_COMMUNITY): Payer: Medicare Other

## 2014-02-19 DIAGNOSIS — K439 Ventral hernia without obstruction or gangrene: Secondary | ICD-10-CM

## 2014-02-19 DIAGNOSIS — D62 Acute posthemorrhagic anemia: Secondary | ICD-10-CM

## 2014-02-19 DIAGNOSIS — R933 Abnormal findings on diagnostic imaging of other parts of digestive tract: Secondary | ICD-10-CM

## 2014-02-19 DIAGNOSIS — N184 Chronic kidney disease, stage 4 (severe): Secondary | ICD-10-CM

## 2014-02-19 LAB — COMPREHENSIVE METABOLIC PANEL
ALK PHOS: 37 U/L — AB (ref 39–117)
ALT: 13 U/L (ref 0–53)
AST: 14 U/L (ref 0–37)
Albumin: 3.5 g/dL (ref 3.5–5.2)
BUN: 22 mg/dL (ref 6–23)
CO2: 26 meq/L (ref 19–32)
Calcium: 8.5 mg/dL (ref 8.4–10.5)
Chloride: 102 mEq/L (ref 96–112)
Creatinine, Ser: 1.84 mg/dL — ABNORMAL HIGH (ref 0.50–1.35)
GFR calc non Af Amer: 37 mL/min — ABNORMAL LOW (ref >90)
GFR, EST AFRICAN AMERICAN: 43 mL/min — AB (ref >90)
GLUCOSE: 134 mg/dL — AB (ref 70–99)
POTASSIUM: 4.9 meq/L (ref 3.7–5.3)
SODIUM: 139 meq/L (ref 137–147)
Total Bilirubin: 0.5 mg/dL (ref 0.3–1.2)
Total Protein: 5.9 g/dL — ABNORMAL LOW (ref 6.0–8.3)

## 2014-02-19 LAB — CBC
HCT: 38.5 % — ABNORMAL LOW (ref 39.0–52.0)
HEMATOCRIT: 32.4 % — AB (ref 39.0–52.0)
Hemoglobin: 12 g/dL — ABNORMAL LOW (ref 13.0–17.0)
Hemoglobin: 10.5 g/dL — ABNORMAL LOW (ref 13.0–17.0)
MCH: 27.7 pg (ref 26.0–34.0)
MCH: 28.3 pg (ref 26.0–34.0)
MCHC: 31.2 g/dL (ref 30.0–36.0)
MCHC: 32.4 g/dL (ref 30.0–36.0)
MCV: 88.9 fL (ref 78.0–100.0)
MCV: 87.3 fL (ref 78.0–100.0)
PLATELETS: 217 10*3/uL (ref 150–400)
Platelets: 181 10*3/uL (ref 150–400)
RBC: 4.33 MIL/uL (ref 4.22–5.81)
RBC: 3.71 MIL/uL — ABNORMAL LOW (ref 4.22–5.81)
RDW: 15.5 % (ref 11.5–15.5)
RDW: 15.5 % (ref 11.5–15.5)
WBC: 17.1 10*3/uL — ABNORMAL HIGH (ref 4.0–10.5)
WBC: 12 10*3/uL — AB (ref 4.0–10.5)

## 2014-02-19 MED ORDER — PANTOPRAZOLE SODIUM 40 MG PO TBEC
40.0000 mg | DELAYED_RELEASE_TABLET | Freq: Two times a day (BID) | ORAL | Status: DC
  Administered 2014-02-19 – 2014-02-20 (×2): 40 mg via ORAL
  Filled 2014-02-19 (×2): qty 1

## 2014-02-19 MED ORDER — PEG 3350-KCL-NA BICARB-NACL 420 G PO SOLR
4000.0000 mL | Freq: Once | ORAL | Status: AC
  Administered 2014-02-19: 4000 mL via ORAL
  Filled 2014-02-19: qty 4000

## 2014-02-19 MED ORDER — METOPROLOL TARTRATE 25 MG PO TABS
25.0000 mg | ORAL_TABLET | Freq: Two times a day (BID) | ORAL | Status: DC
  Administered 2014-02-19 – 2014-02-20 (×3): 25 mg via ORAL
  Filled 2014-02-19 (×3): qty 1

## 2014-02-19 MED ORDER — IPRATROPIUM-ALBUTEROL 0.5-2.5 (3) MG/3ML IN SOLN
3.0000 mL | Freq: Four times a day (QID) | RESPIRATORY_TRACT | Status: DC
  Administered 2014-02-20 (×2): 3 mL via RESPIRATORY_TRACT
  Filled 2014-02-19 (×4): qty 3

## 2014-02-19 NOTE — Progress Notes (Signed)
Pt was able to finish most of his Golytely without difficulty. Will continue to monitor.

## 2014-02-19 NOTE — Care Management Note (Signed)
    Page 1 of 1   02/19/2014     3:21:53 PM   CARE MANAGEMENT NOTE 02/19/2014  Patient:  HAZIEL, MOLNER   Account Number:  [id number]  Date Initiated:  02/19/2014  Documentation initiated by:  [name]  Subjective/Objective Assessment:   Pt admitted from home where he lives with his wife. Pt is HOH but wife translates. Pt and wife state they do not anticipate HH/DME.     Action/Plan:   Anticipated DC Date:     Anticipated DC Plan:  HOME/SELF CARE      DC Planning Services  CM consult      Choice offered to / List presented to:             Status of service:  Completed, signed off Medicare Important Message given?   (If response is "NO", the following Medicare IM given date fields will be blank) Date Medicare IM given:   Date Additional Medicare IM given:    Discharge Disposition:    Per UR Regulation:    If discussed at Long Length of Stay Meetings, dates discussed:    Comments:  02/19/14 [name] RN BSN CM

## 2014-02-19 NOTE — Progress Notes (Signed)
Patient seen and examined. Above note reviewed.  He is undergoing bowel prep for colonoscopy tomorrow. He continues to have bright red blood per rectum. Hemoglobin has mildly trended down but is still in acceptable range. Patient does not wish to receive any blood transfusions since he is a Jehovah's Witness.   COPD appears stable. He does not have evidence of wheezing at this time.  He has chronic kidney disease stage IV. His wife reports that his baseline creatinine runs between 1.6-1.8. He is status post nephrectomy in 2005 for renal cancer. Renal ultrasound is unremarkable. Continue to follow renal function.  Large abdominal wall hernia. He is undergoing evaluation at [facility] for surgical repair  [facility]

## 2014-02-19 NOTE — Consult Note (Signed)
REVIEWED.  

## 2014-02-19 NOTE — Progress Notes (Signed)
Utilization Review Complete  

## 2014-02-19 NOTE — Progress Notes (Signed)
Pt is on the golytely bowel prep for coloscopy tomorrow. Pt's wife is at bedside and is concerned about blood in stool. I called Laban Emperor, NP about wife's concerns, and I have be reassuring wife that blood is to be expected as pt is here with a rectal bleed and that he needs to continue to bowel prep so that his system is as clear as possible so that they have a the best shot at visualizing the bleed. Wife and pt are agreeable to continuing prep. Will continue to monitor.

## 2014-02-19 NOTE — Consult Note (Signed)
Referring Provider: Dyanne Carrel, NP Primary Care Physician:  Vic Blackbird, MD Primary Gastroenterologist:  Dr. Oneida Alar   Date of Admission: 02/18/14 Date of Consultation: 02/19/14  Reason for Consultation:  Rectal bleeding  HPI:  [name] Ball is a pleasant 63 year old male who is extremely hard of hearing. His wife is at the bedside and interprets via sign language. He is able to communicate without signing. He reports waking up at 3am yesterday morning with upper abdominal pain, then noted large amount of bright red blood per rectum. Acute onset. Had a second episode around 6 am, copious amounts. He had scant amount of hematochezia this morning after passing gas but no further significant bleeding since admission. Denies NSAID use. Takes aspirin daily. Notes more constipation lately but not significant. Admitting Hgb 13.8, trending down to 12 today. Pain located supraumbilically.   He reports last colonoscopy and endoscopy in 2010 in [place]. Per wife, benign polyps, diverticulosis, hemorrhoids, and question of gastric ulcer per wife's report. She states this is when he started taking Dexilant, which helps significantly with upper GI symptoms. No dysphagia or refractory GERD while taking Dexilant. He tells me he is in the process of evaluation for inguinal hernia repair at [facility].   Past Medical History  Diagnosis Date  . Allergy   . COPD (chronic obstructive pulmonary disease)   . Hypertension   . Thyroid disease     hypothryoid  . Migraine   . Ulcer   . Diverticulitis   . Solar purpura   . Asthma   . Low testosterone   . GERD (gastroesophageal reflux disease)   . Arthritis   . Cancer     kidney  . Ventral hernia     multiple with multiple repairs    Past Surgical History  Procedure Laterality Date  . Kidney cyst removal  jan 2005    renal cell carcinoma  . Abdominal mesh inserted  june 2006, may 2008,dec 2010  . Knee minescus repair Left feb 2007  .  Cholecystectomy  june 2007  . Colon surgery  sept 2007  . Colonoscopy    . Hernia repair  05/2005 5/20108, 11/2009    with mesh    Prior to Admission medications   Medication Sig Start Date End Date Taking? Authorizing Provider  acetaminophen-codeine (TYLENOL #3) 300-30 MG per tablet TAKE 1 TABLET BY MOUTH EVERY 4 HOURS AS NEEDED FOR PAIN   Yes Alycia Rossetti, MD  allopurinol (ZYLOPRIM) 100 MG tablet Take 100 mg by mouth at bedtime.   Yes Historical Provider, MD  amLODipine (NORVASC) 5 MG tablet Take 5 mg by mouth daily.   Yes Historical Provider, MD  aspirin EC 81 MG tablet Take 81 mg by mouth daily.   Yes Historical Provider, MD  aspirin-acetaminophen-caffeine (EXCEDRIN MIGRAINE) [phone] MG per tablet Take 1 tablet by mouth every 6 (six) hours as needed for migraine.   Yes Historical Provider, MD  cholecalciferol (VITAMIN D) 1000 UNITS tablet Take 1,000 Units by mouth daily.   Yes Historical Provider, MD  clobetasol cream (TEMOVATE) 8.65 % Apply 1 application topically 2 (two) times daily. 01/08/14  Yes Historical Provider, MD  clotrimazole-betamethasone (LOTRISONE) cream Apply 1 application topically 2 (two) times daily. 11/19/13  Yes Alycia Rossetti, MD  DEXILANT 60 MG capsule TAKE 1 CAPSULE BY MOUTH EVERY DAY   Yes Alycia Rossetti, MD  diphenhydrAMINE (BENADRYL) 25 MG tablet Take 25 mg by mouth every 6 (six) hours as needed  for allergies.    Yes Historical Provider, MD  eletriptan (RELPAX) 20 MG tablet Take 1 tablet (20 mg total) by mouth as needed for migraine. One tablet by mouth at onset of headache. May repeat in 2 hours if headache persists or recurs. 01/16/14  Yes Alycia Rossetti, MD  ipratropium (ATROVENT HFA) 17 MCG/ACT inhaler Inhale 2 puffs into the lungs every 6 (six) hours. 11/23/13  Yes Alycia Rossetti, MD  levalbuterol [facility] HFA) 45 MCG/ACT inhaler Inhale 1-2 puffs into the lungs every 4 (four) hours as needed for wheezing.   Yes Historical Provider, MD  levothyroxine  (SYNTHROID, LEVOTHROID) 50 MCG tablet Take 50 mcg by mouth daily before breakfast.   Yes Historical Provider, MD  loratadine (CLARITIN) 10 MG tablet Take 10 mg by mouth at bedtime.    Yes Historical Provider, MD  metoprolol (LOPRESSOR) 50 MG tablet TAKE 1 TABLET BY MOUTH TWICE DAILY 01/14/14  Yes Alycia Rossetti, MD  mometasone [facility]) 220 MCG/INH inhaler Inhale 2 puffs into the lungs 2 (two) times daily. 10/31/13  Yes Alycia Rossetti, MD  nystatin (MYCOSTATIN/NYSTOP) 100000 UNIT/GM POWD daily as needed (Fungus). Apply to affected area three times a day 11/19/13  Yes Alycia Rossetti, MD  testosterone cypionate (DEPO-TESTOSTERONE) 200 MG/ML injection Inject 1 mL (200 mg total) into the muscle every 14 (fourteen) days. 08/03/13  Yes Alycia Rossetti, MD  vitamin B-12 (CYANOCOBALAMIN) 1000 MCG tablet Take 1,000 mcg by mouth daily.   Yes Historical Provider, MD  vitamin E 400 UNIT capsule Take 400 Units by mouth daily.   Yes Historical Provider, MD    Current Facility-Administered Medications  Medication Dose Route Frequency Provider Last Rate Last Dose  . 0.9 %  sodium chloride infusion   Intravenous Continuous Radene Gunning, NP 75 mL/hr at 02/18/14 2107    . acetaminophen (TYLENOL) tablet 650 mg  650 mg Oral Q6H PRN Radene Gunning, NP       Or  . acetaminophen (TYLENOL) suppository 650 mg  650 mg Rectal Q6H PRN Radene Gunning, NP      . albuterol (PROVENTIL) (2.5 MG/3ML) 0.083% nebulizer solution 2.5 mg  2.5 mg Nebulization Q2H PRN Radene Gunning, NP      . allopurinol (ZYLOPRIM) tablet 100 mg  100 mg Oral QHS Radene Gunning, NP   100 mg at 02/18/14 2103  . alum & mag hydroxide-simeth (MAALOX/MYLANTA) 200-200-20 MG/5ML suspension 30 mL  30 mL Oral Q6H PRN Radene Gunning, NP      . clotrimazole-betamethasone (LOTRISONE) cream 1 application  1 application Topical BID Radene Gunning, NP   1 application at 78/46/96 2107  . ipratropium-albuterol (DUONEB) 0.5-2.5 (3) MG/3ML nebulizer solution 3 mL  3 mL  Nebulization Q4H Kathie Dike, MD   3 mL at 02/19/14 0714  . levothyroxine (SYNTHROID, LEVOTHROID) tablet 50 mcg  50 mcg Oral QAC breakfast Radene Gunning, NP      . loratadine (CLARITIN) tablet 10 mg  10 mg Oral QHS Radene Gunning, NP   10 mg at 02/18/14 2103  . morphine 2 MG/ML injection 1 mg  1 mg Intravenous Q3H PRN Radene Gunning, NP   1 mg at 02/19/14 0550  . nystatin (MYCOSTATIN/NYSTOP) topical powder   Topical Daily PRN Radene Gunning, NP      . ondansetron [facility]) tablet 4 mg  4 mg Oral Q6H PRN Radene Gunning, NP       Or  .  ondansetron (ZOFRAN) injection 4 mg  4 mg Intravenous Q6H PRN Radene Gunning, NP   4 mg at 02/19/14 0342  . pantoprazole (PROTONIX) EC tablet 40 mg  40 mg Oral BID AC Sandi L Fields, MD      . polyethylene glycol-electrolytes (NuLYTELY/GoLYTELY) solution 4,000 mL  4,000 mL Oral Once Orvil Feil, NP        Allergies as of 02/18/2014 - Review Complete 02/18/2014  Allergen Reaction Noted  . Ether Rash 01/21/2014    Family History  Problem Relation Age of Onset  . Cancer Mother     lung  . Colon cancer Neg Hx     History   Social History  . Marital Status: Married    Spouse Name: N/A    Number of Children: N/A  . Years of Education: N/A   Occupational History  . Not on file.   Social History Main Topics  . Smoking status: Former Smoker    Quit date: 12/06/1996  . Smokeless tobacco: Never Used  . Alcohol Use: Yes     Comment: a few shots a few times a week  . Drug Use: No  . Sexual Activity: Not on file   Other Topics Concern  . Not on file   Social History Narrative  . No narrative on file    Review of Systems: As mentioned in HPI.   Physical Exam: Vital signs in last 24 hours: Temp:  [97.5 F (36.4 C)-98.2 F (36.8 C)] 98.2 F (36.8 C) (03/17 0411) Pulse Rate:  [90-107] 107 (03/17 0411) Resp:  [15-20] 18 (03/17 0411) BP: (100-121)/(58-93) 116/64 mmHg (03/17 0411) SpO2:  [90 %-97 %] 90 % (03/17 0716) Weight:  [273 lb 13 oz (124.2  kg)] 273 lb 13 oz (124.2 kg) (03/16 1238) Last BM Date: 02/18/14 General:   Alert,  Well-developed, well-nourished, pleasant and cooperative in NAD Head:  Normocephalic and atraumatic. Eyes:  Sclera clear, no icterus.   Conjunctiva pink. Ears:  HOH, understands through sign language and reading lips Nose:  No deformity, discharge,  or lesions. Mouth:  No deformity or lesions, dentition normal. Neck:  Supple; no masses or thyromegaly. Lungs:  Clear throughout to auscultation.   No wheezes, crackles, or rhonchi. No acute distress. Heart:  S1 S2 present without murmur Abdomen:  Soft, obese, tender to palpation RLQ, nondistended. No masses, hepatosplenomegaly. +BS. Question ventral hernia. Well-healed surgical incisions noted. Rectal:  Deferred until time of colonoscopy.   Msk:  Symmetrical without gross deformities. Normal posture. Extremities:  Without clubbing or edema. Neurologic:  Alert and  oriented x4;  grossly normal neurologically. Skin:  Intact without significant lesions or rashes. Cervical Nodes:  No significant cervical adenopathy. Psych:  Alert and cooperative. Normal mood and affect.  Intake/Output from previous day: 03/16 0701 - 03/17 0700 In: 422.5 [I.V.:422.5] Out: -  Intake/Output this shift:    Lab Results:  Recent Labs  02/18/14 1553 02/18/14 2200 02/19/14 0536  WBC 13.6* 14.4* 17.1*  HGB 12.6* 12.1* 12.0*  HCT 40.0 38.0* 38.5*  PLT 198 207 217   BMET  Recent Labs  02/18/14 0924 02/19/14 0536  NA 136* 139  K 4.8 4.9  CL 102 102  CO2 25 26  GLUCOSE 170* 134*  BUN 17 22  CREATININE 1.39* 1.84*  CALCIUM 8.7 8.5   LFT  Recent Labs  02/19/14 0536  PROT 5.9*  ALBUMIN 3.5  AST 14  ALT 13  ALKPHOS 37*  BILITOT 0.5  Studies/Results: Dg Chest Port 1 View  02/18/2014   CLINICAL DATA:  Worsening shortness of breath.  EXAM: PORTABLE CHEST - 1 VIEW  COMPARISON:  CT ABD/PELV WO CM dated 01/09/2014  FINDINGS: 1506 hr. Mild scarring or atelectasis  at both lung bases appears similar to the prior abdominal CT. There is no confluent airspace opacity or pleural effusion. The heart size is normal. No osseous abnormalities are identified. Telemetry leads overlie the chest.  IMPRESSION: No acute cardiopulmonary process identified. Mild basilar atelectasis or scarring is similar to prior abdominal CT.   Electronically Signed   By: Camie Patience M.D.   On: 02/18/2014 16:27    Impression: 63 year old male admitted with acute onset of large volume hematochezia with associated abdominal pain. Rectal bleeding has tapered off since admission, with Hgb holding steady now in the 12 range; admitting Hgb 13.8. Reportedly last procedure in 2010 in [place] as mentioned above. Clinically, he is improving and has no significant upper GI symptoms. Differentials include ischemic colitis, less likely diverticular, doubt secondary to upper GI source. Recommend a full liquid diet with plans for colonoscopy on 3/18 with Dr. Oneida Alar. Continue to monitor H/H. Risks and benefits were discussed in detail with patient and wife; both stated understanding.   Plan: Full liquids today Start Golytely prep now Colonoscopy with Dr. Oneida Alar on 3/18 Monitor H/H Protonix BID for supportive measures Further recommendations to follow.  Orvil Feil, ANP-BC [facility]     LOS: 1 day    02/19/2014, 9:58 AM

## 2014-02-19 NOTE — Progress Notes (Signed)
[facility] PROGRESS NOTE  [name] Ball OZD:[id number] DOB: 12-May-1951 DOA: 02/18/2014 PCP: Vic Blackbird, MD  Assessment/Plan: Principal Problem:  BRBPR (bright red blood per rectum): No further episodes. Appreciate GI assistance.  Patient with history of diverticulosis and hemorrhoids and gastric ulcer. No NSAIDs. Takes one aspirin daily. Hemoglobin  stable. Colonoscopy for tomorrow. Monitor   Active Problems:  COPD with asthma: May have mild exacerbation. Some improvement. continue nebs every 4 hours. Chest x-ray with no cardiopulmonary process.    Essential hypertension, malignant: On admission blood pressure somewhat soft. Home medications  amlodipine, metoprolol were held. SBP range 100-121, HR 107. Will resume metoprolol at lower dose with parameters. Monitor  Hypothyroidism: TSH 1.52. Continue home medication   Chronic kidney disease (CKD), stage IV (severe): Status post nephrectomy 2005- secondary to renal cancer. Will request renal US. Will monitor renal output. Creatinine trending upward.  Will monitor output.    Chronic pain syndrome: Appears stable at baseline.   GERD (gastroesophageal reflux disease): stable.  Will provide PPI.   Abdominal wall hernia: Patient seen by surgery at [facility]. Will need surgical repair. Has seen cardiology for clearance and now is planning for pulmonary evaluation from Dr. Luan Pulling for clearance as well.    Code Status: full Family Communication: wife at bedside Disposition Plan: home when ready   Consultants:  GI  Procedures:    Antibiotics:  none  HPI/Subjective: Sitting up smiling and conversing with wife. Reports some improvement in pain  Objective: Filed Vitals:   02/19/14 0411  BP: 116/64  Pulse: 107  Temp: 98.2 F (36.8 C)  Resp: 18    Intake/Output Summary (Last 24 hours) at 02/19/14 1115 Last data filed at 02/19/14 0800  Gross per 24 hour  Intake  422.5 ml  Output      0 ml  Net  422.5 ml   Filed  Weights   02/18/14 0908 02/18/14 1238  Weight: 125.193 kg (276 lb) 124.2 kg (273 lb 13 oz)    Exam:   General:  Appear comfortable  Cardiovascular: RRR No LE edema  Respiratory: normal effort improved air flow. Continues with wheeze  Abdomen: obese soft +BS mild tenderness in Lower quadrants no guarding  Musculoskeletal: no clubbing or cyanosis   Data Reviewed: Basic Metabolic Panel:  Recent Labs Lab 02/18/14 0924 02/19/14 0536  NA 136* 139  K 4.8 4.9  CL 102 102  CO2 25 26  GLUCOSE 170* 134*  BUN 17 22  CREATININE 1.39* 1.84*  CALCIUM 8.7 8.5   Liver Function Tests:  Recent Labs Lab 02/19/14 0536  AST 14  ALT 13  ALKPHOS 37*  BILITOT 0.5  PROT 5.9*  ALBUMIN 3.5   No results found for this basename: LIPASE, AMYLASE,  in the last 168 hours No results found for this basename: AMMONIA,  in the last 168 hours CBC:  Recent Labs Lab 02/18/14 0924 02/18/14 1553 02/18/14 2200 02/19/14 0536  WBC 14.4* 13.6* 14.4* 17.1*  NEUTROABS 6.0  --   --   --   HGB 13.8 12.6* 12.1* 12.0*  HCT 43.3 40.0 38.0* 38.5*  MCV 86.8 87.3 87.4 88.9  PLT 222 198 207 217   Cardiac Enzymes: No results found for this basename: CKTOTAL, CKMB, CKMBINDEX, TROPONINI,  in the last 168 hours BNP (last 3 results) No results found for this basename: PROBNP,  in the last 8760 hours CBG: No results found for this basename: GLUCAP,  in the last 168 hours  No results found for this  or any previous visit (from the past 240 hour(s)).   Studies: Dg Chest Port 1 View  02/18/2014   CLINICAL DATA:  Worsening shortness of breath.  EXAM: PORTABLE CHEST - 1 VIEW  COMPARISON:  CT ABD/PELV WO CM dated 01/09/2014  FINDINGS: 1506 hr. Mild scarring or atelectasis at both lung bases appears similar to the prior abdominal CT. There is no confluent airspace opacity or pleural effusion. The heart size is normal. No osseous abnormalities are identified. Telemetry leads overlie the chest.  IMPRESSION: No acute  cardiopulmonary process identified. Mild basilar atelectasis or scarring is similar to prior abdominal CT.   Electronically Signed   By: Camie Patience M.D.   On: 02/18/2014 16:27    Scheduled Meds: . allopurinol  100 mg Oral QHS  . clotrimazole-betamethasone  1 application Topical BID  . ipratropium-albuterol  3 mL Nebulization Q4H  . levothyroxine  50 mcg Oral QAC breakfast  . loratadine  10 mg Oral QHS  . pantoprazole  40 mg Oral BID AC   Continuous Infusions: . sodium chloride 75 mL/hr at 02/18/14 2107    Principal Problem:   BRBPR (bright red blood per rectum) Active Problems:   Hypothyroidism   Essential hypertension, malignant   Chronic kidney disease (CKD), stage IV (severe)   Obesity, unspecified   Chronic pain syndrome   COPD with asthma   GERD (gastroesophageal reflux disease)   Abdominal wall hernia   GI bleed    Time spent: 30 minutes   [facility] Pager [phone]. If 7PM-7AM, please contact night-coverage at [email or web address], password [facility] 02/19/2014, 11:15 AM  LOS: 1 day

## 2014-02-20 ENCOUNTER — Encounter (HOSPITAL_COMMUNITY)
Admission: EM | Disposition: A | Discharge status: Home / Self Care | Payer: Self-pay | Source: Home / Self Care | Attending: Internal Medicine

## 2014-02-20 ENCOUNTER — Encounter (HOSPITAL_COMMUNITY): Payer: Self-pay | Admitting: Gastroenterology

## 2014-02-20 DIAGNOSIS — K922 Gastrointestinal hemorrhage, unspecified: Secondary | ICD-10-CM

## 2014-02-20 DIAGNOSIS — D126 Benign neoplasm of colon, unspecified: Secondary | ICD-10-CM

## 2014-02-20 DIAGNOSIS — K5730 Diverticulosis of large intestine without perforation or abscess without bleeding: Secondary | ICD-10-CM

## 2014-02-20 DIAGNOSIS — K5731 Diverticulosis of large intestine without perforation or abscess with bleeding: Principal | ICD-10-CM | POA: Diagnosis present

## 2014-02-20 HISTORY — PX: COLONOSCOPY: SHX5424

## 2014-02-20 LAB — BASIC METABOLIC PANEL
BUN: 15 mg/dL (ref 6–23)
CO2: 30 mEq/L (ref 19–32)
Calcium: 8.4 mg/dL (ref 8.4–10.5)
Chloride: 102 mEq/L (ref 96–112)
Creatinine, Ser: 1.7 mg/dL — ABNORMAL HIGH (ref 0.50–1.35)
GFR calc Af Amer: 48 mL/min — ABNORMAL LOW (ref >90)
GFR calc non Af Amer: 41 mL/min — ABNORMAL LOW (ref >90)
Glucose, Bld: 133 mg/dL — ABNORMAL HIGH (ref 70–99)
Potassium: 4.5 mEq/L (ref 3.7–5.3)
Sodium: 139 mEq/L (ref 137–147)

## 2014-02-20 LAB — CBC WITH DIFFERENTIAL/PLATELET
Basophils Absolute: 0 10*3/uL (ref 0.0–0.1)
Basophils Relative: 0 % (ref 0–1)
Eosinophils Absolute: 0.5 10*3/uL (ref 0.0–0.7)
Eosinophils Relative: 4 % (ref 0–5)
HCT: 33.8 % — ABNORMAL LOW (ref 39.0–52.0)
Hemoglobin: 10.6 g/dL — ABNORMAL LOW (ref 13.0–17.0)
Lymphocytes Relative: 47 % — ABNORMAL HIGH (ref 12–46)
Lymphs Abs: 5.7 10*3/uL — ABNORMAL HIGH (ref 0.7–4.0)
MCH: 27.5 pg (ref 26.0–34.0)
MCHC: 31.4 g/dL (ref 30.0–36.0)
MCV: 87.6 fL (ref 78.0–100.0)
Monocytes Absolute: 0.8 10*3/uL (ref 0.1–1.0)
Monocytes Relative: 7 % (ref 3–12)
Neutro Abs: 5.1 10*3/uL (ref 1.7–7.7)
Neutrophils Relative %: 42 % — ABNORMAL LOW (ref 43–77)
Platelets: 187 10*3/uL (ref 150–400)
RBC: 3.86 MIL/uL — ABNORMAL LOW (ref 4.22–5.81)
RDW: 15.6 % — ABNORMAL HIGH (ref 11.5–15.5)
WBC: 12.1 10*3/uL — ABNORMAL HIGH (ref 4.0–10.5)

## 2014-02-20 SURGERY — COLONOSCOPY
Anesthesia: Moderate Sedation

## 2014-02-20 MED ORDER — SODIUM CHLORIDE 0.9 % IV SOLN
INTRAVENOUS | Status: DC
  Administered 2014-02-20: 12:00:00 via INTRAVENOUS

## 2014-02-20 MED ORDER — STERILE WATER FOR IRRIGATION IR SOLN
Status: DC | PRN
  Administered 2014-02-20: 13:00:00

## 2014-02-20 MED ORDER — MEPERIDINE HCL 100 MG/ML IJ SOLN
INTRAMUSCULAR | Status: DC | PRN
  Administered 2014-02-20: 50 mg via INTRAVENOUS
  Administered 2014-02-20: 25 mg via INTRAVENOUS

## 2014-02-20 MED ORDER — MIDAZOLAM HCL 5 MG/5ML IJ SOLN
INTRAMUSCULAR | Status: AC
  Filled 2014-02-20: qty 10

## 2014-02-20 MED ORDER — MEPERIDINE HCL 100 MG/ML IJ SOLN
INTRAMUSCULAR | Status: AC
  Filled 2014-02-20: qty 2

## 2014-02-20 MED ORDER — MIDAZOLAM HCL 5 MG/5ML IJ SOLN
INTRAMUSCULAR | Status: DC | PRN
  Administered 2014-02-20 (×2): 2 mg via INTRAVENOUS

## 2014-02-20 NOTE — Op Note (Addendum)
[place] [facility] [address] [place], [address]   COLONOSCOPY PROCEDURE REPORT  PATIENT: Ball, [name]  MR#: [id number] BIRTHDATE: [birth year] , 46  yrs. old GENDER: Male ENDOSCOPIST: Barney Drain, MD REFERRED NA:TFTDDUK [place], M.D. PROCEDURE DATE:  02/20/2014 PROCEDURE:   Colonoscopy with cold biopsy polypectomy INDICATIONS:Rectal Bleeding.  PSHx: VENTRAL HERNIA REPAIR, LARGE UMB/VENTRAL HERNIA ON CT FEB 2015. MEDICATIONS: Demerol 75 mg IV and Versed 4 mg IV  DESCRIPTION OF PROCEDURE:    Physical exam was performed.  Informed consent was obtained from the patient after explaining the benefits, risks, and alternatives to procedure.  The patient was connected to monitor and placed in left lateral position. Continuous oxygen was provided by nasal cannula and IV medicine administered through an indwelling cannula.  After administration of sedation and rectal exam, the patients rectum was intubated and the EC-[id number] ([id number]  colonoscope was advanced under direct visualization to the ileum.  The scope was removed slowly by carefully examining the color, texture, anatomy, and integrity mucosa on the way out.  The patient was recovered in endoscopy and discharged home in satisfactory condition.    COLON FINDINGS: The mucosa appeared normal in the terminal ileum.  , 4 MM DESCENDING COLON POLYP REMOVED VIA COLD FORCEPS. There was severe diverticulosis noted in the transverse colon, descending colon, and sigmoid colon with associated muscular hypertrophy and tortuosity. The colon IS redundant.  Manual abdominal counter-pressure was used to reach the cecum, and Moderate sized internal hemorrhoids were found.  PREP QUALITY: good. CECAL W/D TIME: 18 minutes     COMPLICATIONS: None  ENDOSCOPIC IMPRESSION: 1.   RECTAL BLEEDING DUE TO DIVERTICULOSIS 2.   The colon IS redundant 3.   Moderate sized internal hemorrhoids  RECOMMENDATIONS: CONTINUE YOUR WEIGHT LOSS  EFFORTS. PT SHOULD BE TESTED FOR SLEEP APNEA. FOLLOW A HIGH FIBER/LOW FAT DIET.  AVOID ITEMS THAT CAUSE BLOATING.  BIOPSY RESULTS SHOULD BE BACK IN 7 DAYS.  Next colonoscopy in 5-10 years.       _______________________________ Lorrin MaisBarney Drain, MD 02/25/2014 2:35 PM Revised: 02/25/2014 2:35 PM

## 2014-02-20 NOTE — Discharge Summary (Signed)
Patient seen, independently examined and chart reviewed. I agree with exam, assessment and plan discussed with Dyanne Carrel, NP.   Late entry.  63 year old male presented with history of recurrent blood per rectum, known history of diverticulosis. Admitted for GI evaluation. Undergoing colonoscopy, rectal bleeding felt to be secondary to diverticulosis. Recommendations were for high-fiber, low-fat diet. Case is discussed with Dr. Oneida Alar and she felt the patient could be discharged. She recommend stopping aspirin.  PMH  COPD  HTN  CKD stage IV  Status post nephrectomy 2005- secondary to renal cancer  [employer]   Subjective:  He feels well and has no new complaints. No bleeding.   Objective:  Afebrile, vital signs stable. Appears calm and comfortable. Speech fluent and clear. Reads lips very well, very hard of hearing. Wife assists with sign language. Cardiovascular regular rate and rhythm. No murmur, rub or gallop. Respiratory clear to auscultation bilaterally. No wheezes, rales or rhonchi. Normal respiratory effort. Abdomen is soft.   Hemoglobin stable 10.6. Chronic leukocytosis is stable, can be followed up as an outpatient. Creatinine improved, has decreased, 1.7. Appears to be approaching baseline.  Doing well status post diverticular bleed.   stable for discharge home.  Consider outpatient evaluation of her chronic leukocytosis.  Murray Hodgkins, MD  [facility]  [phone]

## 2014-02-20 NOTE — Discharge Summary (Signed)
Physician Discharge Summary  Valley Meneley ERQ:[id number] DOB: 06-23-1951 DOA: 02/18/2014  PCP: Milinda Antis, MD  Admit date: 02/18/2014 Discharge date: 02/20/2014  Time spent: 40 minutes  Recommendations for Outpatient Follow-up:  1. Follow up with PCP 1 week . Recommend CBC to track hg. May want to consider evaluation for sleep apnea.  2. Dr. Darrick Penna office will contact patient with biopsy results   Discharge Diagnoses:  Principal Problem:   BRBPR (bright red blood per rectum) Active Problems:  Diverticulosis of colon with hemorrhage Acute blood loss anemia   Hypothyroidism   Essential hypertension, malignant   Chronic kidney disease (CKD), stage IV (severe)   Obesity, unspecified   Chronic pain syndrome   COPD with asthma   GERD (gastroesophageal reflux disease)   Abdominal wall hernia         Discharge Condition: stable  Diet recommendation: High fiber low fat diet. Avoid items that cause bloating.   Filed Weights   02/18/14 0908 02/18/14 1238  Weight: 125.193 kg (276 lb) 124.2 kg (273 lb 13 oz)    History of present illness:  [name] Ball is a 63 y.o. [name] with past medical history that includes COPD not on home oxygen, hypertension, diverticulosis, GERD, multiple abdominal hernias status post several repairs, renal cancer status post nephrectomy 2005 presented on 3/16 with chief complaint of bright red blood per rectum. Information obtained from the patient and his wife who interprets via sign language as patient is deaf. They reported he was in his usual state of health until 3 AM on 3/16 when he awakened to go to the bathroom. He reported he expelled bright red blood per rectum with very little stool. He stateed "I practically filled up the toilet". Associated symptoms included abdominal pain, nausea with vomiting some dizziness. Denied coffee ground emesis. Reported the pain  epigastric nonradiating. He stateed he takes one aspirin 325 mg daily. He denied NSAID  given his renal function. He reported having a colonoscopy and endoscopy in 2010 which revealed diverticulosis, hemorrhoids and gastric ulcer. In addition he had been more short of breath than usual but attributed this to the fact he developed a cold over the previous 3 days from his grandchild. Is not on home oxygen and not on home nebulizers but is on inhalers. Initial evaluation in the emergency department revealed complete blood count with hemoglobin of 13.8 basic metabolic panel creatinine of 1.3. He was hemodynamically stable and afebrile. He wass not hypoxic.   Hospital Course:  Principal Problem:  BRBPR (bright red blood per rectum): likely related diverticulosis per colonoscopy 02/20/14. No further episodes. Appreciate GI assistance. Patient with history of diverticulosis and hemorrhoids and gastric ulcer. No NSAIDs.  Hemoglobin stable. At discharge instructed on high fiber low fat diet. Avoiding aspirin. Recommend follow up with PCP 1 week. Recommend CBC to track Hg.   Active Problems:  COPD with asthma: Mild exacerbation on admission. Provided nebs every 4 hours. Chest x-ray with no cardiopulmonary process. At discharge no wheezing. Oxygen saturation level 93% on room air. Continue home inhalers.   Essential hypertension, malignant: On admission blood pressure somewhat soft. Home medications amlodipine, metoprolol were held. BP range 129-141. Will resume all home med at discharge   Hypothyroidism: TSH 1.52. Continue home medication   Chronic kidney disease (CKD), stage IV (severe): Status post nephrectomy 2005- secondary to renal cancer. Renal US unremarkable. Creatinine baseline range 1.6-1.8. Creatinine remained within this range during hospitalization   Chronic pain syndrome: Remained stable at baseline.  GERD (gastroesophageal reflux disease): stable.    Abdominal wall hernia: Patient seen by surgery at [facility]. Will need surgical repair. Has seen cardiology for clearance and now is  planning for pulmonary evaluation from Dr. Luan Pulling for clearance as well.    Procedures: ENDOSCOPIC IMPRESSION: 02/20/14  1. RECTAL BLEEDING DUE TO DIVERTICULOSIS  2. The colon IS redundant  3. Moderate sized internal hemorrhoids  Renal US 02/19/14 Unremarkable right kidney.  Surgical absence of left kidney  Consultations:  Dr. Oneida Alar [facility]  Discharge Exam: Filed Vitals:   02/20/14 1406  BP: 111/71  Pulse: 83  Temp: 97.3 F (36.3 C)  Resp:     General: obese NAD Cardiovascular: RRR No MGR No LE edema Respiratory: normal effort BS clear bilaterally no wheeze no rhonchi Abdomen: obese soft +BS non-tender to palpation. No guarding  Discharge Instructions      Discharge Orders   Future Orders Complete By Expires   Diet - low sodium heart healthy  As directed    Discharge instructions  As directed    Comments:     Take medications as directed Follow up with PCP 1-2 weeks recommend CBC to track Hg Dr. Oneida Alar office will contact with results If symptoms reoccur seek medical attention   Increase activity slowly  As directed        Medication List    STOP taking these medications       aspirin EC 81 MG tablet     aspirin-acetaminophen-caffeine 250-250-65 MG per tablet  Commonly known as:  EXCEDRIN MIGRAINE      TAKE these medications       acetaminophen-codeine 300-30 MG per tablet  Commonly known as:  TYLENOL #3  TAKE 1 TABLET BY MOUTH EVERY 4 HOURS AS NEEDED FOR PAIN     allopurinol 100 MG tablet  Commonly known as:  ZYLOPRIM  Take 100 mg by mouth at bedtime.     amLODipine 5 MG tablet  Commonly known as:  NORVASC  Take 5 mg by mouth daily.     cholecalciferol 1000 UNITS tablet  Commonly known as:  VITAMIN D  Take 1,000 Units by mouth daily.     clobetasol cream 0.05 %  Commonly known as:  TEMOVATE  Apply 1 application topically 2 (two) times daily.     clotrimazole-betamethasone cream  Commonly known as:  LOTRISONE  Apply 1  application topically 2 (two) times daily.     DEXILANT 60 MG capsule  Generic drug:  dexlansoprazole  TAKE 1 CAPSULE BY MOUTH EVERY DAY     diphenhydrAMINE 25 MG tablet  Commonly known as:  BENADRYL  Take 25 mg by mouth every 6 (six) hours as needed for allergies.     eletriptan 20 MG tablet  Commonly known as:  RELPAX  Take 1 tablet (20 mg total) by mouth as needed for migraine. One tablet by mouth at onset of headache. May repeat in 2 hours if headache persists or recurs.     ipratropium 17 MCG/ACT inhaler  Commonly known as:  ATROVENT HFA  Inhale 2 puffs into the lungs every 6 (six) hours.     levalbuterol 45 MCG/ACT inhaler  Commonly known as:  XOPENEX HFA  Inhale 1-2 puffs into the lungs every 4 (four) hours as needed for wheezing.     levothyroxine 50 MCG tablet  Commonly known as:  SYNTHROID, LEVOTHROID  Take 50 mcg by mouth daily before breakfast.     loratadine 10 MG tablet  Commonly known as:  CLARITIN  Take 10 mg by mouth at bedtime.     metoprolol 50 MG tablet  Commonly known as:  LOPRESSOR  TAKE 1 TABLET BY MOUTH TWICE DAILY     mometasone 220 MCG/INH inhaler  Commonly known as:  ASMANEX  Inhale 2 puffs into the lungs 2 (two) times daily.     nystatin 100000 UNIT/GM Powd  daily as needed (Fungus). Apply to affected area three times a day     testosterone cypionate 200 MG/ML injection  Commonly known as:  DEPO-TESTOSTERONE  Inject 1 mL (200 mg total) into the muscle every 14 (fourteen) days.     vitamin B-12 1000 MCG tablet  Commonly known as:  CYANOCOBALAMIN  Take 1,000 mcg by mouth daily.     vitamin E 400 UNIT capsule  Take 400 Units by mouth daily.       Allergies  Allergen Reactions  . Ether Rash    Took a long time to wake up Doesn't wake up from it   Follow-up Information   Follow up with Vic Blackbird, MD. Schedule an appointment as soon as possible for a visit in 1 week. (recommend CBC to track Hg. )    Specialty:  Family Medicine    Contact information:   [address] [place] [address] [phone]        The results of significant diagnostics from this hospitalization (including imaging, microbiology, ancillary and laboratory) are listed below for reference.    Significant Diagnostic Studies: US Renal  02/19/2014   CLINICAL DATA:  Worsening renal failure  EXAM: RENAL/URINARY TRACT ULTRASOUND COMPLETE  COMPARISON:  CT abdomen and pelvis 01/09/2014  FINDINGS: Right Kidney:  Length: 11.9 cm. Normal cortical thickness and echogenicity for body habitus. No mass, hydronephrosis or shadowing calcification.  Left Kidney:  Surgically absent  Bladder:  Normal appearance. Right ureteral jet was not seen during the period of imaging.  At the left abdomen, a large area of hypo echogenicity is identified measuring 10.6 x 8.3 x 10.4 cm, not seen on prior CT exam. This collection demonstrates internal motion on cine analysis. Delayed images demonstrate resolution of this collection. This likely represented fluid within the colon ; additional history indicates patient is undergoing bowel preparation for colonoscopy and this likely represented colonic fluid from the bowel prep.  IMPRESSION: Unremarkable right kidney.  Surgical absence of left kidney.   Electronically Signed   By: Lavonia Dana M.D.   On: 02/19/2014 15:55   Dg Chest Port 1 View  02/18/2014   CLINICAL DATA:  Worsening shortness of breath.  EXAM: PORTABLE CHEST - 1 VIEW  COMPARISON:  CT ABD/PELV WO CM dated 01/09/2014  FINDINGS: 1506 hr. Mild scarring or atelectasis at both lung bases appears similar to the prior abdominal CT. There is no confluent airspace opacity or pleural effusion. The heart size is normal. No osseous abnormalities are identified. Telemetry leads overlie the chest.  IMPRESSION: No acute cardiopulmonary process identified. Mild basilar atelectasis or scarring is similar to prior abdominal CT.   Electronically Signed   By: Camie Patience M.D.   On:  02/18/2014 16:27    Microbiology: No results found for this or any previous visit (from the past 240 hour(s)).   Labs: Basic Metabolic Panel:  Recent Labs Lab 02/18/14 0924 02/19/14 0536 02/20/14 0531  NA 136* 139 139  K 4.8 4.9 4.5  CL 102 102 102  CO2 25 26 30   GLUCOSE 170* 134* 133*  BUN 17  22 15  CREATININE 1.39* 1.84* 1.70*  CALCIUM 8.7 8.5 8.4   Liver Function Tests:  Recent Labs Lab 02/19/14 0536  AST 14  ALT 13  ALKPHOS 37*  BILITOT 0.5  PROT 5.9*  ALBUMIN 3.5   No results found for this basename: LIPASE, AMYLASE,  in the last 168 hours No results found for this basename: AMMONIA,  in the last 168 hours CBC:  Recent Labs Lab 02/18/14 0924 02/18/14 1553 02/18/14 2200 02/19/14 0536 02/19/14 1646 02/20/14 0531  WBC 14.4* 13.6* 14.4* 17.1* 12.0* 12.1*  NEUTROABS 6.0  --   --   --   --  5.1  HGB 13.8 12.6* 12.1* 12.0* 10.5* 10.6*  HCT 43.3 40.0 38.0* 38.5* 32.4* 33.8*  MCV 86.8 87.3 87.4 88.9 87.3 87.6  PLT 222 198 207 217 181 187   Cardiac Enzymes: No results found for this basename: CKTOTAL, CKMB, CKMBINDEX, TROPONINI,  in the last 168 hours BNP: BNP (last 3 results) No results found for this basename: PROBNP,  in the last 8760 hours CBG: No results found for this basename: GLUCAP,  in the last 168 hours     Signed:  Radene Gunning  [facility] 02/20/2014, 3:00 PM

## 2014-02-20 NOTE — Discharge Instructions (Signed)
YOU MOST LIKELY DUE TO BLED FROM DIVERTICULOSIS. You had 1 small polyp removed. You have internal hemorrhoids and diverticulosis IN YOUR RIGHT AND LEFT COLON.   CONTINUE YOUR WEIGHT LOSS EFFORTS.  YOU SHOULD  BE TESTED FOR SLEEP APNEA.  FOLLOW A HIGH FIBER/LOW FAT DIET. AVOID ITEMS THAT CAUSE BLOATING. SEE INFO BELOW.  YOUR BIOPSY RESULTS SHOULD BE BACK IN 7 DAYS.  Next colonoscopy in 10 years.   Colonoscopy Care After Read the instructions outlined below and refer to this sheet in the next week. These discharge instructions provide you with general information on caring for yourself after you leave the hospital. While your treatment has been planned according to the most current medical practices available, unavoidable complications occasionally occur. If you have any problems or questions after discharge, call DR. Darah Simkin, [phone].  ACTIVITY  You may resume your regular activity, but move at a slower pace for the next 24 hours.   Take frequent rest periods for the next 24 hours.   Walking will help get rid of the air and reduce the bloated feeling in your belly (abdomen).   No driving for 24 hours (because of the medicine (anesthesia) used during the test).   You may shower.   Do not sign any important legal documents or operate any machinery for 24 hours (because of the anesthesia used during the test).    NUTRITION  Drink plenty of fluids.   You may resume your normal diet as instructed by your doctor.   Begin with a light meal and progress to your normal diet. Heavy or fried foods are harder to digest and may make you feel sick to your stomach (nauseated).   Avoid alcoholic beverages for 24 hours or as instructed.    MEDICATIONS  You may resume your normal medications.   WHAT YOU CAN EXPECT TODAY  Some feelings of bloating in the abdomen.   Passage of more gas than usual.   Spotting of blood in your stool or on the toilet paper  .  IF YOU HAD POLYPS  REMOVED DURING THE COLONOSCOPY:  Eat a soft diet IF YOU HAVE NAUSEA, BLOATING, ABDOMINAL PAIN, OR VOMITING.    FINDING OUT THE RESULTS OF YOUR TEST Not all test results are available during your visit. DR. Oneida Alar WILL CALL YOU WITHIN 7 DAYS OF YOUR PROCEDUE WITH YOUR RESULTS. Do not assume everything is normal if you have not heard from DR. Khylie Larmore IN ONE WEEK, CALL HER OFFICE AT [phone].  SEEK IMMEDIATE MEDICAL ATTENTION AND CALL THE OFFICE: [phone] IF:  You have more than a spotting of blood in your stool.   Your belly is swollen (abdominal distention).   You are nauseated or vomiting.   You have a temperature over 101F.   You have abdominal pain or discomfort that is severe or gets worse throughout the day.  Polyps, Colon  A polyp is extra tissue that grows inside your body. Colon polyps grow in the large intestine. The large intestine, also called the colon, is part of your digestive system. It is a long, hollow tube at the end of your digestive tract where your body makes and stores stool. Most polyps are not dangerous. They are benign. This means they are not cancerous. But over time, some types of polyps can turn into cancer. Polyps that are smaller than a pea are usually not harmful. But larger polyps could someday become or may already be cancerous. To be safe, doctors remove all polyps and test  them.   WHO GETS POLYPS? Anyone can get polyps, but certain people are more likely than others. You may have a greater chance of getting polyps if:  You are over 50.   You have had polyps before.   Someone in your family has had polyps.   Someone in your family has had cancer of the large intestine.   Find out if someone in your family has had polyps. You may also be more likely to get polyps if you:   Eat a lot of fatty foods   Smoke   Drink alcohol   Do not exercise  Eat too much   TREATMENT  The caregiver will remove the polyp during sigmoidoscopy or  colonoscopy.  PREVENTION There is not one sure way to prevent polyps. You might be able to lower your risk of getting them if you:  Eat more fruits and vegetables and less fatty food.   Do not smoke.   Avoid alcohol.   Exercise every day.   Lose weight if you are overweight.   Eating more calcium and folate can also lower your risk of getting polyps. Some foods that are rich in calcium are milk, cheese, and broccoli. Some foods that are rich in folate are chickpeas, kidney beans, and spinach.   High-Fiber Diet A high-fiber diet changes your normal diet to include more whole grains, legumes, fruits, and vegetables. Changes in the diet involve replacing refined carbohydrates with unrefined foods. The calorie level of the diet is essentially unchanged. The Dietary Reference Intake (recommended amount) for adult males is 38 grams per day. For adult females, it is 25 grams per day. Pregnant and lactating women should consume 28 grams of fiber per day. Fiber is the intact part of a plant that is not broken down during digestion. Functional fiber is fiber that has been isolated from the plant to provide a beneficial effect in the body. PURPOSE  Increase stool bulk.   Ease and regulate bowel movements.   Lower cholesterol.  INDICATIONS THAT YOU NEED MORE FIBER  Constipation and hemorrhoids.   Uncomplicated diverticulosis (intestine condition) and irritable bowel syndrome.   Weight management.   As a protective measure against hardening of the arteries (atherosclerosis), diabetes, and cancer.   GUIDELINES FOR INCREASING FIBER IN THE DIET  Start adding fiber to the diet slowly. A gradual increase of about 5 more grams (2 slices of whole-wheat bread, 2 servings of most fruits or vegetables, or 1 bowl of high-fiber cereal) per day is best. Too rapid an increase in fiber may result in constipation, flatulence, and bloating.   Drink enough water and fluids to keep your urine clear or pale  yellow. Water, juice, or caffeine-free drinks are recommended. Not drinking enough fluid may cause constipation.   Eat a variety of high-fiber foods rather than one type of fiber.   Try to increase your intake of fiber through using high-fiber foods rather than fiber pills or supplements that contain small amounts of fiber.   The goal is to change the types of food eaten. Do not supplement your present diet with high-fiber foods, but replace foods in your present diet.  INCLUDE A VARIETY OF FIBER SOURCES  Replace refined and processed grains with whole grains, canned fruits with fresh fruits, and incorporate other fiber sources. White rice, white breads, and most bakery goods contain little or no fiber.   Brown whole-grain rice, buckwheat oats, and many fruits and vegetables are all good sources of fiber. These  include: broccoli, Brussels sprouts, cabbage, cauliflower, beets, sweet potatoes, white potatoes (skin on), carrots, tomatoes, eggplant, squash, berries, fresh fruits, and dried fruits.   Cereals appear to be the richest source of fiber. Cereal fiber is found in whole grains and bran. Bran is the fiber-rich outer coat of cereal grain, which is largely removed in refining. In whole-grain cereals, the bran remains. In breakfast cereals, the largest amount of fiber is found in those with "bran" in their names. The fiber content is sometimes indicated on the label.   You may need to include additional fruits and vegetables each day.   In baking, for 1 cup white flour, you may use the following substitutions:   1 cup whole-wheat flour minus 2 tablespoons.   1/2 cup white flour plus 1/2 cup whole-wheat flour.   Diverticulosis Diverticulosis is a common condition that develops when small pouches (diverticula) form in the wall of the colon. The risk of diverticulosis increases with age. It happens more often in people who eat a low-fiber diet. Most individuals with diverticulosis have no  symptoms. Those individuals with symptoms usually experience belly (abdominal) pain, constipation, or loose stools (diarrhea).  HOME CARE INSTRUCTIONS  Increase the amount of fiber in your diet as directed by your caregiver or dietician. This may reduce symptoms of diverticulosis.   Drink at least 6 to 8 glasses of water each day to prevent constipation.   Try not to strain when you have a bowel movement.   Avoiding nuts and seeds to prevent complications is still an uncertain benefit.       FOODS HAVING HIGH FIBER CONTENT INCLUDE:  Fruits. Apple, peach, pear, tangerine, raisins, prunes.   Vegetables. Brussels sprouts, asparagus, broccoli, cabbage, carrot, cauliflower, romaine lettuce, spinach, summer squash, tomato, winter squash, zucchini.   Starchy Vegetables. Baked beans, kidney beans, lima beans, split peas, lentils, potatoes (with skin).   Grains. Whole wheat bread, brown rice, bran flake cereal, plain oatmeal, white rice, shredded wheat, bran muffins.    SEEK IMMEDIATE MEDICAL CARE IF:  You develop increasing pain or severe bloating.   You have an oral temperature above 101F.   You develop vomiting or bowel movements that are bloody or black.   Hemorrhoids Hemorrhoids are dilated (enlarged) veins around the rectum. Sometimes clots will form in the veins. This makes them swollen and painful. These are called thrombosed hemorrhoids. Causes of hemorrhoids include:  Constipation.   Straining to have a bowel movement.   HEAVY LIFTING HOME CARE INSTRUCTIONS  Eat a well balanced diet and drink 6 to 8 glasses of water every day to avoid constipation. You may also use a bulk laxative.   Avoid straining to have bowel movements.   Keep anal area dry and clean.   Do not use a donut shaped pillow or sit on the toilet for long periods. This increases blood pooling and pain.   Move your bowels when your body has the urge; this will require less straining and will decrease  pain and pressure.

## 2014-02-20 NOTE — H&P (Signed)
Primary Care Physician:  Vic Blackbird, MD Primary Gastroenterologist:  Dr. Oneida Alar  Pre-Procedure History & Physical: HPI:  [name] Ball is a 63 y.o. male here for  BRBPR.  Past Medical History  Diagnosis Date  . Allergy   . COPD (chronic obstructive pulmonary disease)   . Hypertension   . Thyroid disease     hypothryoid  . Migraine   . Ulcer   . Diverticulitis   . Solar purpura   . Asthma   . Low testosterone   . GERD (gastroesophageal reflux disease)   . Arthritis   . Cancer     kidney  . Ventral hernia     multiple with multiple repairs    Past Surgical History  Procedure Laterality Date  . Kidney cyst removal  jan 2005    renal cell carcinoma  . Abdominal mesh inserted  june 2006, may 2008,dec 2010  . Knee minescus repair Left feb 2007  . Cholecystectomy  june 2007  . Colon surgery  sept 2007  . Colonoscopy    . Hernia repair  05/2005 5/20108, 11/2009    with mesh    Prior to Admission medications   Medication Sig Start Date End Date Taking? Authorizing Provider  acetaminophen-codeine (TYLENOL #3) 300-30 MG per tablet TAKE 1 TABLET BY MOUTH EVERY 4 HOURS AS NEEDED FOR PAIN   Yes Alycia Rossetti, MD  allopurinol (ZYLOPRIM) 100 MG tablet Take 100 mg by mouth at bedtime.   Yes Historical Provider, MD  amLODipine (NORVASC) 5 MG tablet Take 5 mg by mouth daily.   Yes Historical Provider, MD  aspirin EC 81 MG tablet Take 81 mg by mouth daily.   Yes Historical Provider, MD  aspirin-acetaminophen-caffeine (EXCEDRIN MIGRAINE) [phone] MG per tablet Take 1 tablet by mouth every 6 (six) hours as needed for migraine.   Yes Historical Provider, MD  cholecalciferol (VITAMIN D) 1000 UNITS tablet Take 1,000 Units by mouth daily.   Yes Historical Provider, MD  clobetasol cream (TEMOVATE) 2.77 % Apply 1 application topically 2 (two) times daily. 01/08/14  Yes Historical Provider, MD  clotrimazole-betamethasone (LOTRISONE) cream Apply 1 application topically 2 (two) times  daily. 11/19/13  Yes Alycia Rossetti, MD  DEXILANT 60 MG capsule TAKE 1 CAPSULE BY MOUTH EVERY DAY   Yes Alycia Rossetti, MD  diphenhydrAMINE (BENADRYL) 25 MG tablet Take 25 mg by mouth every 6 (six) hours as needed for allergies.    Yes Historical Provider, MD  eletriptan (RELPAX) 20 MG tablet Take 1 tablet (20 mg total) by mouth as needed for migraine. One tablet by mouth at onset of headache. May repeat in 2 hours if headache persists or recurs. 01/16/14  Yes Alycia Rossetti, MD  ipratropium (ATROVENT HFA) 17 MCG/ACT inhaler Inhale 2 puffs into the lungs every 6 (six) hours. 11/23/13  Yes Alycia Rossetti, MD  levalbuterol [facility] HFA) 45 MCG/ACT inhaler Inhale 1-2 puffs into the lungs every 4 (four) hours as needed for wheezing.   Yes Historical Provider, MD  levothyroxine (SYNTHROID, LEVOTHROID) 50 MCG tablet Take 50 mcg by mouth daily before breakfast.   Yes Historical Provider, MD  loratadine (CLARITIN) 10 MG tablet Take 10 mg by mouth at bedtime.    Yes Historical Provider, MD  metoprolol (LOPRESSOR) 50 MG tablet TAKE 1 TABLET BY MOUTH TWICE DAILY 01/14/14  Yes Alycia Rossetti, MD  mometasone [facility]) 220 MCG/INH inhaler Inhale 2 puffs into the lungs 2 (two) times daily. 10/31/13  Yes Modena Nunnery  [place], MD  nystatin (MYCOSTATIN/NYSTOP) 100000 UNIT/GM POWD daily as needed (Fungus). Apply to affected area three times a day 11/19/13  Yes Alycia Rossetti, MD  testosterone cypionate (DEPO-TESTOSTERONE) 200 MG/ML injection Inject 1 mL (200 mg total) into the muscle every 14 (fourteen) days. 08/03/13  Yes Alycia Rossetti, MD  vitamin B-12 (CYANOCOBALAMIN) 1000 MCG tablet Take 1,000 mcg by mouth daily.   Yes Historical Provider, MD  vitamin E 400 UNIT capsule Take 400 Units by mouth daily.   Yes Historical Provider, MD    Allergies as of 02/18/2014 - Review Complete 02/18/2014  Allergen Reaction Noted  . Ether Rash 01/21/2014    Family History  Problem Relation Age of Onset  . Cancer Mother      lung  . Colon cancer Neg Hx     History   Social History  . Marital Status: Married    Spouse Name: N/A    Number of Children: N/A  . Years of Education: N/A   Occupational History  . Not on file.   Social History Main Topics  . Smoking status: Former Smoker    Quit date: 12/06/1996  . Smokeless tobacco: Never Used  . Alcohol Use: Yes     Comment: a few shots a few times a week  . Drug Use: No  . Sexual Activity: Not on file   Other Topics Concern  . 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    Review of Systems: See HPI, otherwise negative ROS   Physical Exam: BP 141/77  Pulse 88  Temp(Src) 98.7 F (37.1 C) (Oral)  Resp 18  Ht 5\' 6"  (1.676 m)  Wt 273 lb 13 oz (124.2 kg)  BMI 44.22 kg/m2  SpO2 94% General:   Alert,  pleasant and cooperative in NAD Head:  Normocephalic and atraumatic. Neck:  Supple; Lungs:  Clear throughout to auscultation.    Heart:  Regular rate and rhythm. Abdomen:  Soft, nontender and nondistended. Normal bowel sounds, without guarding, and without rebound.   Neurologic:  Alert and  oriented x4;  grossly normal neurologically.  Impression/Plan:    BRBPR  PLAN: TCS TODAY

## 2014-02-20 NOTE — Progress Notes (Signed)
Patient with orders to be discharge home. Discharge instructions given, patietn and spouse verbalized understanding. Patient stable. Patient left in private vehicle with family.

## 2014-02-22 ENCOUNTER — Encounter (HOSPITAL_COMMUNITY): Payer: Self-pay | Admitting: Gastroenterology

## 2014-02-25 ENCOUNTER — Encounter: Payer: Self-pay | Admitting: Family Medicine

## 2014-02-25 ENCOUNTER — Encounter: Payer: Self-pay | Admitting: Gastroenterology

## 2014-02-25 ENCOUNTER — Telehealth: Payer: Self-pay | Admitting: Gastroenterology

## 2014-02-25 ENCOUNTER — Other Ambulatory Visit: Payer: Self-pay | Admitting: *Deleted

## 2014-02-25 MED ORDER — HYDROCODONE-ACETAMINOPHEN 5-325 MG PO TABS: 1.0000 | ORAL_TABLET | Freq: Four times a day (QID) | ORAL | Status: DC | PRN

## 2014-02-25 NOTE — Telephone Encounter (Signed)
Please call pt. HE had a simple adenomas removed from HIS colon. FOLLOW A HIGH FIBER DIET. TCS IN 5-10 YEARS.

## 2014-02-25 NOTE — Telephone Encounter (Signed)
Per patient e-mail, patient is experiencing increased pain.   MD ordered new prescription for Norco 5/325mg  1-2 tabs PO q 6hours PRN pain.   Prescription printed and patient wife made aware to come to office to pick up.

## 2014-02-25 NOTE — Telephone Encounter (Signed)
Tried to call pt. Phone number not working. Mailed a letter for him to call.

## 2014-02-26 ENCOUNTER — Encounter: Payer: Self-pay | Admitting: Family Medicine

## 2014-02-28 ENCOUNTER — Other Ambulatory Visit: Payer: Self-pay | Admitting: Family Medicine

## 2014-02-28 NOTE — Telephone Encounter (Signed)
Pt's wife called ( pt hard of hearing) and was informed of his results.

## 2014-03-01 NOTE — Telephone Encounter (Signed)
Refill appropriate and filled per protocol. 

## 2014-03-05 ENCOUNTER — Other Ambulatory Visit (HOSPITAL_COMMUNITY): Payer: Self-pay

## 2014-03-05 DIAGNOSIS — J441 Chronic obstructive pulmonary disease with (acute) exacerbation: Secondary | ICD-10-CM

## 2014-03-08 ENCOUNTER — Other Ambulatory Visit: Payer: Self-pay | Admitting: Family Medicine

## 2014-03-08 NOTE — Telephone Encounter (Signed)
Refill appropriate and filled per protocol. 

## 2014-03-12 ENCOUNTER — Other Ambulatory Visit: Payer: Self-pay | Admitting: Family Medicine

## 2014-03-12 ENCOUNTER — Encounter: Payer: Self-pay | Admitting: Family Medicine

## 2014-03-12 MED ORDER — HYDROCODONE-ACETAMINOPHEN 5-325 MG PO TABS: 1.0000 | ORAL_TABLET | Freq: Four times a day (QID) | ORAL | Status: DC | PRN

## 2014-03-13 ENCOUNTER — Other Ambulatory Visit: Payer: Self-pay | Admitting: *Deleted

## 2014-03-13 MED ORDER — ALLOPURINOL 100 MG PO TABS: 100.0000 mg | ORAL_TABLET | Freq: Every day | ORAL | Status: DC

## 2014-03-13 NOTE — Telephone Encounter (Signed)
Refill appropriate and filled per protocol. 

## 2014-03-19 ENCOUNTER — Other Ambulatory Visit: Payer: Self-pay | Admitting: Family Medicine

## 2014-03-19 ENCOUNTER — Encounter: Payer: Self-pay | Admitting: Family Medicine

## 2014-03-19 NOTE — Telephone Encounter (Signed)
Medication refill for one time only.  Patient needs to be seen.  Letter sent for patient to call and schedule.  Pt need hospital follow up appt.  Letter sent

## 2014-03-27 ENCOUNTER — Ambulatory Visit (HOSPITAL_COMMUNITY)
Admission: RE | Admit: 2014-03-27 | Discharge: 2014-03-27 | Disposition: A | Discharge status: Home / Self Care | Payer: Medicare Other | Source: Ambulatory Visit | Attending: Pulmonary Disease | Admitting: Pulmonary Disease

## 2014-03-27 DIAGNOSIS — J449 Chronic obstructive pulmonary disease, unspecified: Secondary | ICD-10-CM | POA: Insufficient documentation

## 2014-03-27 DIAGNOSIS — J4489 Other specified chronic obstructive pulmonary disease: Secondary | ICD-10-CM | POA: Insufficient documentation

## 2014-03-27 DIAGNOSIS — R0609 Other forms of dyspnea: Secondary | ICD-10-CM | POA: Insufficient documentation

## 2014-03-27 DIAGNOSIS — R0989 Other specified symptoms and signs involving the circulatory and respiratory systems: Principal | ICD-10-CM | POA: Insufficient documentation

## 2014-03-27 LAB — PULMONARY FUNCTION TEST
DL/VA % pred: 84 %
DL/VA: 3.68 ml/min/mmHg/L
DLCO COR % PRED: 47 %
DLCO COR: 12.75 ml/min/mmHg
DLCO UNC % PRED: 47 %
DLCO UNC: 12.75 ml/min/mmHg
FEF 25-75 PRE: 0.78 L/s
FEF 25-75 Post: 0.91 L/sec
FEF2575-%Change-Post: 16 %
FEF2575-%PRED-PRE: 32 %
FEF2575-%Pred-Post: 37 %
FEV1-%Change-Post: 8 %
FEV1-%PRED-PRE: 47 %
FEV1-%Pred-Post: 51 %
FEV1-POST: 1.53 L
FEV1-Pre: 1.41 L
FEV1FVC-%CHANGE-POST: 5 %
FEV1FVC-%Pred-Pre: 81 %
FEV6-%CHANGE-POST: 3 %
FEV6-%PRED-PRE: 60 %
FEV6-%Pred-Post: 62 %
FEV6-Post: 2.36 L
FEV6-Pre: 2.27 L
FEV6FVC-%Change-Post: 0 %
FEV6FVC-%PRED-POST: 106 %
FEV6FVC-%Pred-Pre: 105 %
FVC-%CHANGE-POST: 2 %
FVC-%Pred-Post: 59 %
FVC-%Pred-Pre: 57 %
FVC-PRE: 2.31 L
FVC-Post: 2.36 L
POST FEV6/FVC RATIO: 100 %
Post FEV1/FVC ratio: 65 %
Pre FEV1/FVC ratio: 61 %
Pre FEV6/FVC Ratio: 99 %
RV % pred: 114 %
RV: 2.41 L
TLC % PRED: 73 %
TLC: 4.54 L

## 2014-03-27 MED ORDER — ALBUTEROL SULFATE (2.5 MG/3ML) 0.083% IN NEBU
2.5000 mg | INHALATION_SOLUTION | Freq: Once | RESPIRATORY_TRACT | Status: AC
  Administered 2014-03-27: 2.5 mg via RESPIRATORY_TRACT

## 2014-03-29 ENCOUNTER — Telehealth: Payer: Self-pay | Admitting: *Deleted

## 2014-03-29 MED ORDER — DOXYCYCLINE HYCLATE 100 MG PO TABS: 100.0000 mg | ORAL_TABLET | Freq: Two times a day (BID) | ORAL | Status: DC

## 2014-03-29 MED ORDER — PREDNISONE 10 MG PO TABS: ORAL_TABLET | ORAL | Status: DC

## 2014-03-29 NOTE — Telephone Encounter (Signed)
Call placed to patient wife Zigmund Daniel and made aware.

## 2014-03-29 NOTE — Telephone Encounter (Signed)
Received call from patient wife.   Reports that patient has cough and some head/ chest congestion.   Requested to have MD advise on either medication or if OV required.   MD to be made aware.

## 2014-03-29 NOTE — Telephone Encounter (Signed)
Doxy sent, start prednisone if it gets in his chest Bring him in next week if not improving Mucinex BID if tolerated Nasal saline

## 2014-04-09 ENCOUNTER — Telehealth: Payer: Self-pay | Admitting: Family Medicine

## 2014-04-09 MED ORDER — HYDROCODONE-ACETAMINOPHEN 5-325 MG PO TABS: 1.0000 | ORAL_TABLET | Freq: Four times a day (QID) | ORAL | Status: DC | PRN

## 2014-04-09 NOTE — Telephone Encounter (Signed)
okay

## 2014-04-09 NOTE — Telephone Encounter (Signed)
Prescription printed and patient made aware to come to office to pick up.  

## 2014-04-09 NOTE — Telephone Encounter (Signed)
Ok to refill??  Last office visit 12/15/20140  Last refill 03/12/2014.

## 2014-04-09 NOTE — Telephone Encounter (Signed)
[phone] Pt is needing a refill on HYDROcodone-acetaminophen (NORCO) 5-325 MG per tablet

## 2014-04-14 ENCOUNTER — Other Ambulatory Visit: Payer: Self-pay | Admitting: Family Medicine

## 2014-04-15 NOTE — Telephone Encounter (Signed)
Refill appropriate and filled per protocol. 

## 2014-04-18 ENCOUNTER — Encounter: Payer: Self-pay | Admitting: Family Medicine

## 2014-05-04 ENCOUNTER — Encounter: Payer: Self-pay | Admitting: Family Medicine

## 2014-05-09 ENCOUNTER — Encounter: Payer: Self-pay | Admitting: Family Medicine

## 2014-05-09 MED ORDER — HYDROCODONE-ACETAMINOPHEN 5-325 MG PO TABS: 1.0000 | ORAL_TABLET | Freq: Four times a day (QID) | ORAL | Status: DC | PRN

## 2014-05-09 NOTE — Telephone Encounter (Signed)
Prescription printed and patient made aware to come to office to pick up.  

## 2014-05-09 NOTE — Telephone Encounter (Signed)
Ok to refill hydrocodone??  Last office visit 11/19/2013.  Last refill 04/09/2014.

## 2014-05-11 ENCOUNTER — Other Ambulatory Visit: Payer: Self-pay | Admitting: Family Medicine

## 2014-05-13 NOTE — Telephone Encounter (Signed)
Prescription sent to pharmacy.

## 2014-05-21 ENCOUNTER — Encounter (HOSPITAL_COMMUNITY): Payer: Self-pay | Admitting: Emergency Medicine

## 2014-05-21 ENCOUNTER — Emergency Department (HOSPITAL_COMMUNITY)
Admission: EM | Admit: 2014-05-21 | Discharge: 2014-05-22 | Disposition: A | Discharge status: Other Acute Inpatient Hospital | Payer: Medicare Other | Attending: Emergency Medicine | Admitting: Emergency Medicine

## 2014-05-21 DIAGNOSIS — Z79899 Other long term (current) drug therapy: Secondary | ICD-10-CM | POA: Insufficient documentation

## 2014-05-21 DIAGNOSIS — I10 Essential (primary) hypertension: Secondary | ICD-10-CM | POA: Insufficient documentation

## 2014-05-21 DIAGNOSIS — IMO0002 Reserved for concepts with insufficient information to code with codable children: Secondary | ICD-10-CM | POA: Insufficient documentation

## 2014-05-21 DIAGNOSIS — K439 Ventral hernia without obstruction or gangrene: Secondary | ICD-10-CM | POA: Insufficient documentation

## 2014-05-21 DIAGNOSIS — J449 Chronic obstructive pulmonary disease, unspecified: Secondary | ICD-10-CM | POA: Insufficient documentation

## 2014-05-21 DIAGNOSIS — K56609 Unspecified intestinal obstruction, unspecified as to partial versus complete obstruction: Secondary | ICD-10-CM | POA: Insufficient documentation

## 2014-05-21 DIAGNOSIS — R112 Nausea with vomiting, unspecified: Secondary | ICD-10-CM | POA: Insufficient documentation

## 2014-05-21 DIAGNOSIS — E039 Hypothyroidism, unspecified: Secondary | ICD-10-CM | POA: Insufficient documentation

## 2014-05-21 DIAGNOSIS — J4489 Other specified chronic obstructive pulmonary disease: Secondary | ICD-10-CM | POA: Insufficient documentation

## 2014-05-21 DIAGNOSIS — Z862 Personal history of diseases of the blood and blood-forming organs and certain disorders involving the immune mechanism: Secondary | ICD-10-CM | POA: Insufficient documentation

## 2014-05-21 DIAGNOSIS — G43909 Migraine, unspecified, not intractable, without status migrainosus: Secondary | ICD-10-CM | POA: Insufficient documentation

## 2014-05-21 DIAGNOSIS — Z87891 Personal history of nicotine dependence: Secondary | ICD-10-CM | POA: Insufficient documentation

## 2014-05-21 DIAGNOSIS — Z85528 Personal history of other malignant neoplasm of kidney: Secondary | ICD-10-CM | POA: Insufficient documentation

## 2014-05-21 DIAGNOSIS — M129 Arthropathy, unspecified: Secondary | ICD-10-CM | POA: Insufficient documentation

## 2014-05-21 DIAGNOSIS — E291 Testicular hypofunction: Secondary | ICD-10-CM | POA: Insufficient documentation

## 2014-05-21 DIAGNOSIS — K219 Gastro-esophageal reflux disease without esophagitis: Secondary | ICD-10-CM | POA: Insufficient documentation

## 2014-05-21 LAB — COMPREHENSIVE METABOLIC PANEL
ALK PHOS: 49 U/L (ref 39–117)
ALT: 17 U/L (ref 0–53)
AST: 16 U/L (ref 0–37)
Albumin: 4.4 g/dL (ref 3.5–5.2)
BILIRUBIN TOTAL: 0.4 mg/dL (ref 0.3–1.2)
BUN: 24 mg/dL — ABNORMAL HIGH (ref 6–23)
CO2: 25 mEq/L (ref 19–32)
CREATININE: 1.68 mg/dL — AB (ref 0.50–1.35)
Calcium: 9.4 mg/dL (ref 8.4–10.5)
Chloride: 99 mEq/L (ref 96–112)
GFR calc non Af Amer: 41 mL/min — ABNORMAL LOW (ref >90)
GFR, EST AFRICAN AMERICAN: 48 mL/min — AB (ref >90)
GLUCOSE: 129 mg/dL — AB (ref 70–99)
POTASSIUM: 5 meq/L (ref 3.7–5.3)
Sodium: 136 mEq/L — ABNORMAL LOW (ref 137–147)
TOTAL PROTEIN: 7.4 g/dL (ref 6.0–8.3)

## 2014-05-21 LAB — CBC WITH DIFFERENTIAL/PLATELET
Basophils Absolute: 0 10*3/uL (ref 0.0–0.1)
Basophils Relative: 0 % (ref 0–1)
Eosinophils Absolute: 0.3 10*3/uL (ref 0.0–0.7)
Eosinophils Relative: 2 % (ref 0–5)
HCT: 39.6 % (ref 39.0–52.0)
Hemoglobin: 11.7 g/dL — ABNORMAL LOW (ref 13.0–17.0)
Lymphocytes Relative: 26 % (ref 12–46)
Lymphs Abs: 4.3 10*3/uL — ABNORMAL HIGH (ref 0.7–4.0)
MCH: 22 pg — ABNORMAL LOW (ref 26.0–34.0)
MCHC: 29.5 g/dL — ABNORMAL LOW (ref 30.0–36.0)
MCV: 74.4 fL — ABNORMAL LOW (ref 78.0–100.0)
Monocytes Absolute: 0.8 10*3/uL (ref 0.1–1.0)
Monocytes Relative: 5 % (ref 3–12)
Neutro Abs: 11.3 10*3/uL — ABNORMAL HIGH (ref 1.7–7.7)
Neutrophils Relative %: 67 % (ref 43–77)
Platelets: 236 10*3/uL (ref 150–400)
RBC: 5.32 MIL/uL (ref 4.22–5.81)
RDW: 17.1 % — ABNORMAL HIGH (ref 11.5–15.5)
WBC: 16.7 10*3/uL — ABNORMAL HIGH (ref 4.0–10.5)

## 2014-05-21 LAB — LACTIC ACID, PLASMA: Lactic Acid, Venous: 1.4 mmol/L (ref 0.5–2.2)

## 2014-05-21 LAB — LIPASE, BLOOD: Lipase: 37 U/L (ref 11–59)

## 2014-05-21 MED ORDER — MORPHINE SULFATE 4 MG/ML IJ SOLN
4.0000 mg | Freq: Once | INTRAMUSCULAR | Status: AC
  Administered 2014-05-21: 4 mg via INTRAVENOUS
  Filled 2014-05-21: qty 1

## 2014-05-21 MED ORDER — SODIUM CHLORIDE 0.9 % IV BOLUS (SEPSIS)
500.0000 mL | Freq: Once | INTRAVENOUS | Status: AC
  Administered 2014-05-21: 500 mL via INTRAVENOUS

## 2014-05-21 MED ORDER — ONDANSETRON HCL 4 MG/2ML IJ SOLN
4.0000 mg | Freq: Once | INTRAMUSCULAR | Status: AC
  Administered 2014-05-21: 4 mg via INTRAVENOUS
  Filled 2014-05-21: qty 2

## 2014-05-21 NOTE — ED Notes (Signed)
Upper abd pain,  N/v, no diarrhea.  No fever or chills,     Has abd hernia,  And plans surgery 7/1.

## 2014-05-22 ENCOUNTER — Emergency Department (HOSPITAL_COMMUNITY): Payer: Medicare Other

## 2014-05-22 LAB — URINALYSIS, ROUTINE W REFLEX MICROSCOPIC
Glucose, UA: NEGATIVE mg/dL
Hgb urine dipstick: NEGATIVE
KETONES UR: 15 mg/dL — AB
LEUKOCYTES UA: NEGATIVE
Nitrite: NEGATIVE
PROTEIN: 30 mg/dL — AB
Specific Gravity, Urine: >1.03 — ABNORMAL HIGH (ref 1.005–1.030)
Urobilinogen, UA: 0.2 mg/dL (ref 0.0–1.0)
pH: 5.5 (ref 5.0–8.0)

## 2014-05-22 LAB — URINE MICROSCOPIC-ADD ON

## 2014-05-22 MED ORDER — IOHEXOL 300 MG/ML  SOLN: 50.0000 mL | Freq: Once | INTRAMUSCULAR | Status: DC | PRN

## 2014-05-22 MED ORDER — HYDROMORPHONE HCL PF 1 MG/ML IJ SOLN
1.0000 mg | Freq: Once | INTRAMUSCULAR | Status: AC
  Administered 2014-05-22: 1 mg via INTRAVENOUS
  Filled 2014-05-22: qty 1

## 2014-05-22 MED ORDER — PROMETHAZINE HCL 25 MG/ML IJ SOLN
12.5000 mg | Freq: Once | INTRAMUSCULAR | Status: AC
  Administered 2014-05-22: 12.5 mg via INTRAVENOUS
  Filled 2014-05-22: qty 1

## 2014-05-22 NOTE — ED Provider Notes (Signed)
Medical screening examination/treatment/procedure(s) were conducted as a shared visit with non-physician practitioner(s) and myself.  I personally evaluated the patient during the encounter.   Ct Abdomen Pelvis Wo Contrast 05/22/2014   CLINICAL DATA:  Upper abdominal pain, nausea and vomiting.  EXAM: CT ABDOMEN AND PELVIS WITHOUT CONTRAST  TECHNIQUE: Multidetector CT imaging of the abdomen and pelvis was performed following the standard protocol without IV contrast.  COMPARISON:  CT of the abdomen and pelvis from 01/09/2014, and renal ultrasound performed 02/19/2014  FINDINGS: The visualized lung bases are clear.  The liver and spleen are unremarkable in appearance. The patient is status post cholecystectomy, with clips noted along the gallbladder fossa. The pancreas and adrenal glands are unremarkable.  The patient is status post left-sided nephrectomy. Nonspecific perinephric stranding is noted on the right. The right kidney is otherwise unremarkable in appearance. No renal or ureteral stones are identified. There is no evidence of hydronephrosis.  No free fluid is identified. The small bowel is unremarkable in appearance. The stomach is within normal limits. No acute vascular abnormalities are seen. Mild scattered calcification is seen along the abdominal aorta and its branches.  Two small anterior abdominal wall hernias are noted superiorly, just above the patient's anterior abdominal wall mesh, containing only fat.  Inferior to the level of the mesh, there is a very broad-based large hernia extending into the patient's pannus. This contains a long segment of distal ileum, with gradual fecalization and an apparent transition point. Decompressed loops of small bowel are noted more distally, within the right side of the hernia. The distal ileum is dilated up to 3.8 cm in diameter. This raises concern for small bowel obstruction within the hernia, likely secondary to an adhesion.  The hernia also contains a  portion of the patient's bladder; there is mild associated bladder wall thickening, chronic in nature, and scattered debris within the bladder.  The appendix is not definitely seen; there is no evidence for appendicitis. Scattered diverticulosis is noted along the transverse, descending and proximal sigmoid colon, without evidence of diverticulitis.  The bladder is otherwise unremarkable in appearance. The prostate remains normal in size. No inguinal lymphadenopathy is seen.  No acute osseous abnormalities are identified.  IMPRESSION: 1. Dilatation of the distal ileum to 3.8 cm in diameter, within the large broad-based hernia extending into the patient's pannus. There is gradual fecalization, and an apparent transition point, with decompressed loops of small bowel more distally in the right side of the hernia. This raises concern for at least partial small bowel obstruction within the hernia, likely secondary to an adhesion. 2. The hernia also contains a portion of the patient's bladder; mild associated bladder wall thickening noted, with chronic debris, unchanged from the prior study. 3. Two small anterior abdominal wall hernias noted superiorly, just above the patient's abdominal wall mesh, containing only fat. 4. Scattered diverticulosis along the transverse, descending and proximal sigmoid colon, without evidence of diverticulitis.   Electronically Signed   By: Garald Balding M.D.   On: 05/22/2014 02:54  I personally reviewed the imaging tests through PACS system I reviewed available ER/hospitalization records through the EMR   3:35 AM Patient has a small bowel obstruction given his abdominal pain, nausea and vomiting and right-sided abdominal pain with the small bowel instruction occurred in the large ventral hernia.  NG tube.    Awaiting on call back from [facility] general surgery, as pt will benefit from transfer to his surgery team. NG tube placed.  Have updated the  patient and the family  Hoy Morn, MD 05/22/14 [phone]

## 2014-05-22 NOTE — ED Notes (Signed)
Patient transported to [facility] ED via [place].

## 2014-05-22 NOTE — ED Notes (Signed)
Report given to Janett Billow, Agricultural consultant at [facility] ED.  Patient awaiting arrival of [facility].

## 2014-05-22 NOTE — ED Provider Notes (Signed)
CSN: [id number]     Arrival date & time 05/21/14  2028 History   First MD Initiated Contact with Patient 05/21/14 2250     Chief Complaint  Patient presents with  . Abdominal Pain     (Consider location/radiation/quality/duration/timing/severity/associated sxs/prior Treatment) HPI Comments: [name] Ball is a 63 y.o. Male with a past medical history of GERD, diverticulitis, ventral and inguinal hernias with prior mesh surgical repair and is currently awaiting surgery on 06/05/14 for revision of both the upper abdominal and right inguinal hernia, presenting tonight with upper abdominal pain which started last evening and has been progressively worsened throughout today. Pain is worsened with movement and palpation.   He has had nausea with several episodes of emesis today. He denies fevers or chills and has had no diarrhea or dysuria.  His last bm was 2 days ago and was normal.  He also denies chest pain or any increased sob.  He does have COPD and reports his breathing is baseline.  He has taken no medicines for his pain.  Past surgical history is also signifcant for a nephrectomy due to renal cancer and cholecystectomy.  Of note, patient is hard of hearing,  Wife helps communicate using sign language.  Per review of chart, patient is a Restaurant manager, fast food.    The history is provided by the patient and the spouse.    Past Medical History  Diagnosis Date  . Allergy   . COPD (chronic obstructive pulmonary disease)   . Hypertension   . Thyroid disease     hypothryoid  . Migraine   . Ulcer   . Diverticulitis   . Solar purpura   . Asthma   . Low testosterone   . GERD (gastroesophageal reflux disease)   . Arthritis   . Ventral hernia FEB 2015 CT     Large infra umbilical ventral hernia containing loops of small  . Diverticulosis     colonoscopy 3/15  . Cancer     kidney   Past Surgical History  Procedure Laterality Date  . Kidney cyst removal  jan 2005    renal cell carcinoma  .  Abdominal mesh inserted  june 2006, may 2008,dec 2010  . Knee minescus repair Left feb 2007  . Cholecystectomy  june 2007  . Colon surgery  sept 2007  . Colonoscopy    . Hernia repair  05/2005 5/20108, 11/2009    with mesh  . Colonoscopy N/A 02/20/2014    Procedure: COLONOSCOPY;  Surgeon: Danie Binder, MD;  Location: [facility];  Service: Endoscopy;  Laterality: N/A;  . Nephrectomy     Family History  Problem Relation Age of Onset  . Cancer Mother     lung  . Colon cancer Neg Hx    History  Substance Use Topics  . Smoking status: Former Smoker    Quit date: 12/06/1996  . Smokeless tobacco: Never Used  . Alcohol Use: Yes     Comment: a few shots a few times a week    Review of Systems  Constitutional: Negative for fever and chills.  HENT: Negative for congestion and sore throat.   Eyes: Negative.   Respiratory: Negative for chest tightness and shortness of breath.   Cardiovascular: Negative for chest pain.  Gastrointestinal: Positive for nausea, vomiting and abdominal pain.  Genitourinary: Negative.  Negative for dysuria.  Musculoskeletal: Negative for arthralgias, joint swelling and neck pain.  Skin: Negative.  Negative for rash and wound.  Neurological: Negative for dizziness,  weakness, light-headedness, numbness and headaches.  Psychiatric/Behavioral: Negative.       Allergies  Ether  Home Medications   Prior to Admission medications   Medication Sig Start Date End Date Taking? Authorizing Provider  allopurinol (ZYLOPRIM) 100 MG tablet Take 1 tablet (100 mg total) by mouth at bedtime. 03/13/14  Yes Alycia Rossetti, MD  amLODipine (NORVASC) 5 MG tablet Take 5 mg by mouth every morning.   Yes Historical Provider, MD  calcium carbonate (TUMS - DOSED IN MG ELEMENTAL CALCIUM) 500 MG chewable tablet Chew 2-3 tablets by mouth once as needed for indigestion or heartburn.   Yes Historical Provider, MD  cholecalciferol (VITAMIN D) 1000 UNITS tablet Take 1,000 Units by  mouth every morning.    Yes Historical Provider, MD  clobetasol cream (TEMOVATE) 0.24 % Apply 1 application topically 2 (two) times daily. 01/08/14  Yes Historical Provider, MD  clotrimazole-betamethasone (LOTRISONE) cream Apply 1 application topically 2 (two) times daily. 11/19/13  Yes Alycia Rossetti, MD  dexlansoprazole (DEXILANT) 60 MG capsule Take 60 mg by mouth at bedtime.   Yes Historical Provider, MD  diphenhydrAMINE (BENADRYL) 25 MG tablet Take 25 mg by mouth every evening.    Yes Historical Provider, MD  HYDROcodone-acetaminophen (NORCO) 5-325 MG per tablet Take 1-2 tablets by mouth every 6 (six) hours as needed. 05/09/14  Yes Alycia Rossetti, MD  ipratropium (ATROVENT HFA) 17 MCG/ACT inhaler Inhale 2 puffs into the lungs every 6 (six) hours. 11/23/13  Yes Alycia Rossetti, MD  levothyroxine (SYNTHROID, LEVOTHROID) 50 MCG tablet Take 50 mcg by mouth daily before breakfast.   Yes Historical Provider, MD  loratadine (CLARITIN) 10 MG tablet Take 10 mg by mouth every morning.    Yes Historical Provider, MD  metoprolol (LOPRESSOR) 50 MG tablet Take 50 mg by mouth 2 (two) times daily.   Yes Historical Provider, MD  mometasone [facility]) 220 MCG/INH inhaler Inhale 2 puffs into the lungs 2 (two) times daily. 10/31/13  Yes Alycia Rossetti, MD  testosterone cypionate (DEPO-TESTOSTERONE) 200 MG/ML injection Inject 1 mL (200 mg total) into the muscle every 14 (fourteen) days. 08/03/13  Yes Alycia Rossetti, MD  vitamin B-12 (CYANOCOBALAMIN) 1000 MCG tablet Take 1,000 mcg by mouth every morning.    Yes Historical Provider, MD  vitamin E 400 UNIT capsule Take 400 Units by mouth every morning.    Yes Historical Provider, MD  eletriptan (RELPAX) 20 MG tablet Take 1 tablet (20 mg total) by mouth as needed for migraine. One tablet by mouth at onset of headache. May repeat in 2 hours if headache persists or recurs. 01/16/14   Alycia Rossetti, MD  levalbuterol [facility] HFA) 45 MCG/ACT inhaler Inhale 1-2 puffs into  the lungs every 4 (four) hours as needed for wheezing.    Historical Provider, MD   BP 127/67  Pulse 104  Temp(Src) 97.8 F (36.6 C) (Oral)  Resp 18  Ht 5\' 6"  (1.676 m)  Wt 260 lb (117.935 kg)  BMI 41.99 kg/m2  SpO2 95% Physical Exam  Nursing note and vitals reviewed. Constitutional: He appears well-developed and well-nourished.  HENT:  Head: Normocephalic and atraumatic.  Eyes: Conjunctivae are normal.  Neck: Normal range of motion.  Cardiovascular: Normal rate, regular rhythm, normal heart sounds and intact distal pulses.   Pulmonary/Chest: Effort normal and breath sounds normal. No respiratory distress. He has no wheezes.  Abdominal: Soft. Bowel sounds are normal. There is no hepatosplenomegaly. There is tenderness. There is guarding. There is  no rebound.  Abdomen is adipose. TTP over site of upper abdominal incisional hernia site but he has no masses or nodules at this site,  Not a clearly incarcerated hernia but body habitus somewhat limits exam.  Musculoskeletal: Normal range of motion.  Neurological: He is alert.  Skin: Skin is warm and dry.  Psychiatric: He has a normal mood and affect.    ED Course  Procedures (including critical care time) Labs Review Labs Reviewed  CBC WITH DIFFERENTIAL - Abnormal; Notable for the following:    WBC 16.7 (*)    Hemoglobin 11.7 (*)    MCV 74.4 (*)    MCH 22.0 (*)    MCHC 29.5 (*)    RDW 17.1 (*)    Neutro Abs 11.3 (*)    Lymphs Abs 4.3 (*)    All other components within normal limits  COMPREHENSIVE METABOLIC PANEL - Abnormal; Notable for the following:    Sodium 136 (*)    Glucose, Bld 129 (*)    BUN 24 (*)    Creatinine, Ser 1.68 (*)    GFR calc non Af Amer 41 (*)    GFR calc Af Amer 48 (*)    All other components within normal limits  URINALYSIS, ROUTINE W REFLEX MICROSCOPIC - Abnormal; Notable for the following:    Color, Urine AMBER (*)    Specific Gravity, Urine >1.030 (*)    Bilirubin Urine SMALL (*)    Ketones, ur  15 (*)    Protein, ur 30 (*)    All other components within normal limits  URINE MICROSCOPIC-ADD ON - Abnormal; Notable for the following:    Bacteria, UA MANY (*)    All other components within normal limits  URINE CULTURE  LIPASE, BLOOD  LACTIC ACID, PLASMA    Imaging Review No results found.   EKG Interpretation None      MDM   Final diagnoses:  Abdominal pain  Asymptomatic bacteriuria    Labs reviewed.  Pending CT abd/pelvis.  Pt signed out to Dr. Venora Maples.    Evalee Jefferson, PA-C 05/22/14 [phone]

## 2014-05-23 LAB — URINE CULTURE

## 2014-06-11 ENCOUNTER — Other Ambulatory Visit: Payer: Self-pay | Admitting: Family Medicine

## 2014-06-19 ENCOUNTER — Other Ambulatory Visit: Payer: Self-pay | Admitting: Family Medicine

## 2014-06-19 DIAGNOSIS — E785 Hyperlipidemia, unspecified: Secondary | ICD-10-CM

## 2014-06-19 DIAGNOSIS — E291 Testicular hypofunction: Secondary | ICD-10-CM

## 2014-06-19 NOTE — Telephone Encounter (Signed)
Call placed to patient wife.   States that he is still taking injections.   States that patient will come in fasting for labs.   Prescription faxed.

## 2014-06-19 NOTE — Telephone Encounter (Signed)
Call his wife, is she still doing injections If so, He needs to come to lab for  PSA, Testosterone level and Lipid

## 2014-06-19 NOTE — Telephone Encounter (Signed)
Ok to refill??  Last office visit 11/19/2013.  Last refill 08/03/2013, #2 refills.

## 2014-07-05 ENCOUNTER — Other Ambulatory Visit: Payer: Self-pay | Admitting: Family Medicine

## 2014-07-05 NOTE — Telephone Encounter (Signed)
Refill appropriate and filled per protocol. 

## 2014-07-18 ENCOUNTER — Other Ambulatory Visit: Payer: Self-pay | Admitting: Family Medicine

## 2014-07-18 ENCOUNTER — Encounter: Payer: Self-pay | Admitting: Family Medicine

## 2014-07-18 DIAGNOSIS — M1009 Idiopathic gout, multiple sites: Secondary | ICD-10-CM

## 2014-07-22 ENCOUNTER — Encounter: Payer: Self-pay | Admitting: Family Medicine

## 2014-07-22 MED ORDER — LEVALBUTEROL TARTRATE 45 MCG/ACT IN AERO: 1.0000 | INHALATION_SPRAY | RESPIRATORY_TRACT | Status: DC | PRN

## 2014-07-22 NOTE — Telephone Encounter (Signed)
Refill appropriate and filled per protocol. 

## 2014-07-27 ENCOUNTER — Telehealth: Payer: Self-pay | Admitting: *Deleted

## 2014-07-27 NOTE — Telephone Encounter (Signed)
Received e-mail from pharmacy requesting PA for Xopenex.   PA submitted.

## 2014-07-29 NOTE — Telephone Encounter (Signed)
Received PA determination.   PA approved 07/27/2014- 12/05/2014.  [id number].

## 2014-08-02 ENCOUNTER — Encounter: Payer: Self-pay | Admitting: Family Medicine

## 2014-08-05 MED ORDER — HYDROCODONE-ACETAMINOPHEN 5-325 MG PO TABS: 1.0000 | ORAL_TABLET | Freq: Four times a day (QID) | ORAL | Status: DC | PRN

## 2014-08-07 ENCOUNTER — Other Ambulatory Visit: Payer: Self-pay | Admitting: Family Medicine

## 2014-08-07 ENCOUNTER — Other Ambulatory Visit: Payer: Medicare Other

## 2014-08-07 DIAGNOSIS — M1009 Idiopathic gout, multiple sites: Secondary | ICD-10-CM

## 2014-08-07 DIAGNOSIS — E785 Hyperlipidemia, unspecified: Secondary | ICD-10-CM

## 2014-08-07 DIAGNOSIS — E291 Testicular hypofunction: Secondary | ICD-10-CM

## 2014-08-07 LAB — LIPID PANEL
CHOL/HDL RATIO: 4.4 ratio
Cholesterol: 128 mg/dL (ref 0–200)
HDL: 29 mg/dL — AB (ref >39)
LDL CALC: 70 mg/dL (ref 0–99)
TRIGLYCERIDES: 144 mg/dL (ref <150)
VLDL: 29 mg/dL (ref 0–40)

## 2014-08-07 LAB — URIC ACID: Uric Acid, Serum: 7.1 mg/dL (ref 4.0–7.8)

## 2014-08-07 LAB — TESTOSTERONE: Testosterone: 297 ng/dL — ABNORMAL LOW (ref 300–890)

## 2014-08-08 LAB — PSA, MEDICARE: PSA: 1.48 ng/mL (ref <=4.00)

## 2014-08-09 ENCOUNTER — Ambulatory Visit (INDEPENDENT_AMBULATORY_CARE_PROVIDER_SITE_OTHER): Payer: Medicare Other | Admitting: Family Medicine

## 2014-08-09 ENCOUNTER — Encounter: Payer: Self-pay | Admitting: Family Medicine

## 2014-08-09 VITALS — BP 138/78 | HR 68 | Temp 98.0°F | Resp 18 | Ht 66.0 in | Wt 262.0 lb

## 2014-08-09 DIAGNOSIS — B351 Tinea unguium: Secondary | ICD-10-CM

## 2014-08-09 DIAGNOSIS — N184 Chronic kidney disease, stage 4 (severe): Secondary | ICD-10-CM

## 2014-08-09 DIAGNOSIS — L259 Unspecified contact dermatitis, unspecified cause: Secondary | ICD-10-CM

## 2014-08-09 DIAGNOSIS — J449 Chronic obstructive pulmonary disease, unspecified: Secondary | ICD-10-CM

## 2014-08-09 DIAGNOSIS — Z0000 Encounter for general adult medical examination without abnormal findings: Secondary | ICD-10-CM

## 2014-08-09 DIAGNOSIS — Z23 Encounter for immunization: Secondary | ICD-10-CM

## 2014-08-09 DIAGNOSIS — L309 Dermatitis, unspecified: Secondary | ICD-10-CM

## 2014-08-09 DIAGNOSIS — G4730 Sleep apnea, unspecified: Secondary | ICD-10-CM

## 2014-08-09 DIAGNOSIS — I10 Essential (primary) hypertension: Secondary | ICD-10-CM

## 2014-08-09 MED ORDER — TESTOSTERONE CYPIONATE 200 MG/ML IM SOLN: INTRAMUSCULAR | Status: DC

## 2014-08-09 MED ORDER — TERBINAFINE HCL 250 MG PO TABS: 250.0000 mg | ORAL_TABLET | Freq: Every day | ORAL | Status: DC

## 2014-08-09 NOTE — Progress Notes (Signed)
Patient ID: [name] Ball, male   DOB: 08-06-1951, 63 y.o.   MRN: [id number]  Subjective:   Patient presents for Medicare Annual/Subsequent preventive examination.   Patient here for a physical exam. He is status post hernia repair in he's had multiple hernias and had to be repaired after a recent incarceration he is much improved his back pain and abdominal pain have significantly improved. He is more mobile than he was previously and also his work or breathing has improved. Regarding his COPD this has been stable he is being used his nebulizers as prescribed. His fasting labs were reviewed with him regarding his testosterone and PSA.  His wife is here with him today they have a couple of concerns. He has a fungus on his right great toenail which is impressive for a couple months which appears to be getting worse of the nails more brittle but will like to have this treated. He also has recurrence of his hand dermatitis he saw a dermatologist few months back he was given clobetasol cream and has been using moisturizers but this appears to be worsening he will like a new referral to a new dermatologist.  Sleep apnea he was given a BiPAP machine during his hospitalization to improve his breathing is fair difficulty when he was in the ICU he is on very well with the BiPAP at home and needs to have this continued time my office.   Review Past Medical/Family/Social: Per EMR   Risk Factors  Current exercise habits: walks some Dietary issues discussed: Yes  Cardiac risk factors: Obesity (BMI >= 30 kg/m2).   Depression Screen  (Note: if answer to either of the following is "Yes", a more complete depression screening is indicated)  Over the past two weeks, have you felt down, depressed or hopeless? No Over the past two weeks, have you felt little interest or pleasure in doing things? No Have you lost interest or pleasure in daily life? No Do you often feel hopeless? No Do you cry easily over simple  problems? No   Activities of Daily Living  In your present state of health, do you have any difficulty performing the following activities?:  Driving? No  Managing money? No  Feeding yourself? No  Getting from bed to chair? No  Climbing a flight of stairs? No  Preparing food and eating?: No  Bathing or showering? No  Getting dressed: No  Getting to the toilet? No  Using the toilet:No  Moving around from place to place: No  In the past year have you fallen or had a near fall?:No  Are you sexually active? No  Do you have more than one partner? No   Hearing Difficulties:Hearing impaired, uses ASL  Do you often ask people to speak up or repeat themselves? Yes  Do you experience ringing or noises in your ears? No Do you have difficulty understanding soft or whispered voices? Yes  Do you feel that you have a problem with memory? No Do you often misplace items? No  Do you feel safe at home? Yes  Cognitive Testing  Alert? Yes Normal Appearance?Yes  Oriented to person? Yes Place? Yes  Time? Yes  Recall of three objects? Yes  Can perform simple calculations? Yes  Displays appropriate judgment?Yes  Can read the correct time from a watch face?Yes   List the Names of Other Physician/Practitioners you currently use: [facility]    Screening Tests / Date Colonoscopy    - UTD  Zostavax - Due Influenza Vaccine - Given Today Tetanus/tdap- insurance   GEN- denies fatigue, fever, weight loss,weakness, recent illness HEENT- denies eye drainage, change in vision, nasal discharge, CVS- denies chest pain, palpitations RESP- denies SOB, cough, wheeze ABD- denies N/V, change in stools, abd pain GU- denies dysuria, hematuria, dribbling, incontinence MSK- + joint pain, muscle aches, injury Neuro- denies headache, dizziness, syncope, seizure activity    PHYSICAL GEN- NAD, alert and oriented x3,  CVS- RRR, no murmur RESP-CTAB ABD-NABS,soft,NT Incision d/c/i EXT- No  edema Pulses- Radial 2+ Skin- Left palm, mild erythema and peeling/dryness, left knuckle - hyperkeratotic skin over knuckles, few cracks in skin, left great toe nail yellow discoloration, thickened nails, brittle at edges Psych- normal affect and mood      Assessment:    Annual wellness medicare exam   Plan:    During the course of the visit the patient was educated and counseled about appropriate screening and preventive services including:   Flu shot in office Screen Neg  for depression. Diet review for nutrition referral? Yes ____ Not Indicated __x__  Patient Instructions (the written plan) was given to the patient.  Medicare Attestation  I have personally reviewed:  The patient's medical and social history  Their use of alcohol, tobacco or illicit drugs  Their current medications and supplements  The patient's functional ability including ADLs,fall risks, home safety risks, cognitive, and hearing and visual impairment  Diet and physical activities  Evidence for depression or mood disorders  The patient's weight, height, BMI, and visual acuity have been recorded in the chart. I have made referrals, counseling, and provided education to the patient based on review of the above and I have provided the patient with a written personalized care plan for preventive services.

## 2014-08-09 NOTE — Assessment & Plan Note (Signed)
I will continue BiPAP , he may need a repeat sleep study

## 2014-08-09 NOTE — Patient Instructions (Signed)
We  Will send mychart message with lab results Flu shot given Start oral medication for nail fungus Referral to new dermatologist F/U 4 months

## 2014-08-09 NOTE — Assessment & Plan Note (Signed)
Blood pressure is well controlled 

## 2014-08-09 NOTE — Assessment & Plan Note (Signed)
Terbinafine x 12 weeks,.repeat LFT in 6 weeks

## 2014-08-09 NOTE — Assessment & Plan Note (Signed)
Referral back to a new dermatologist for second opinion Now he would continue with moisturizers

## 2014-08-09 NOTE — Assessment & Plan Note (Signed)
Currently stable I will continue him on the BiPAP treating the sleep apnea component

## 2014-08-09 NOTE — Assessment & Plan Note (Signed)
Recheck his renal function. 

## 2014-08-10 LAB — COMPLETE METABOLIC PANEL WITH GFR
ALK PHOS: 41 U/L (ref 39–117)
ALT: 10 U/L (ref 0–53)
AST: 12 U/L (ref 0–37)
Albumin: 4.4 g/dL (ref 3.5–5.2)
BUN: 21 mg/dL (ref 6–23)
CO2: 26 mEq/L (ref 19–32)
CREATININE: 1.47 mg/dL — AB (ref 0.50–1.35)
Calcium: 9.1 mg/dL (ref 8.4–10.5)
Chloride: 102 mEq/L (ref 96–112)
GFR, EST NON AFRICAN AMERICAN: 50 mL/min — AB
GFR, Est African American: 57 mL/min — ABNORMAL LOW
Glucose, Bld: 105 mg/dL — ABNORMAL HIGH (ref 70–99)
Potassium: 4.8 mEq/L (ref 3.5–5.3)
SODIUM: 137 meq/L (ref 135–145)
TOTAL PROTEIN: 6.5 g/dL (ref 6.0–8.3)
Total Bilirubin: 0.5 mg/dL (ref 0.2–1.2)

## 2014-08-10 LAB — CBC WITH DIFFERENTIAL/PLATELET
BASOS ABS: 0 10*3/uL (ref 0.0–0.1)
BASOS PCT: 0 % (ref 0–1)
Eosinophils Absolute: 0.9 10*3/uL — ABNORMAL HIGH (ref 0.0–0.7)
Eosinophils Relative: 8 % — ABNORMAL HIGH (ref 0–5)
HCT: 34.6 % — ABNORMAL LOW (ref 39.0–52.0)
Hemoglobin: 10.2 g/dL — ABNORMAL LOW (ref 13.0–17.0)
Lymphocytes Relative: 41 % (ref 12–46)
Lymphs Abs: 4.8 10*3/uL — ABNORMAL HIGH (ref 0.7–4.0)
MCH: 21.6 pg — AB (ref 26.0–34.0)
MCHC: 29.5 g/dL — AB (ref 30.0–36.0)
MCV: 73.3 fL — ABNORMAL LOW (ref 78.0–100.0)
Monocytes Absolute: 0.9 10*3/uL (ref 0.1–1.0)
Monocytes Relative: 8 % (ref 3–12)
NEUTROS PCT: 43 % (ref 43–77)
Neutro Abs: 5.1 10*3/uL (ref 1.7–7.7)
PLATELETS: 281 10*3/uL (ref 150–400)
RBC: 4.72 MIL/uL (ref 4.22–5.81)
RDW: 18.7 % — AB (ref 11.5–15.5)
WBC: 11.8 10*3/uL — ABNORMAL HIGH (ref 4.0–10.5)

## 2014-08-17 ENCOUNTER — Encounter: Payer: Self-pay | Admitting: *Deleted

## 2014-08-18 ENCOUNTER — Other Ambulatory Visit: Payer: Self-pay | Admitting: Family Medicine

## 2014-08-19 NOTE — Telephone Encounter (Signed)
Refill appropriate and filled per protocol. 

## 2014-08-28 ENCOUNTER — Other Ambulatory Visit: Payer: Self-pay | Admitting: Family Medicine

## 2014-08-28 ENCOUNTER — Encounter: Payer: Self-pay | Admitting: Family Medicine

## 2014-08-28 DIAGNOSIS — L301 Dyshidrosis [pompholyx]: Secondary | ICD-10-CM

## 2014-08-29 ENCOUNTER — Encounter: Payer: Self-pay | Admitting: *Deleted

## 2014-09-03 ENCOUNTER — Ambulatory Visit: Payer: Medicare Other | Admitting: Family Medicine

## 2014-09-22 ENCOUNTER — Encounter: Payer: Self-pay | Admitting: Family Medicine

## 2014-09-23 ENCOUNTER — Other Ambulatory Visit: Payer: Self-pay | Admitting: Family Medicine

## 2014-09-23 MED ORDER — HYDROCODONE-ACETAMINOPHEN 5-325 MG PO TABS: 1.0000 | ORAL_TABLET | Freq: Four times a day (QID) | ORAL | Status: DC | PRN

## 2014-09-30 ENCOUNTER — Other Ambulatory Visit: Payer: Medicare Other

## 2014-09-30 ENCOUNTER — Telehealth: Payer: Self-pay | Admitting: Family Medicine

## 2014-09-30 DIAGNOSIS — Z79899 Other long term (current) drug therapy: Secondary | ICD-10-CM

## 2014-09-30 LAB — COMPLETE METABOLIC PANEL WITH GFR
ALT: 11 U/L (ref 0–53)
AST: 11 U/L (ref 0–37)
Albumin: 4.3 g/dL (ref 3.5–5.2)
Alkaline Phosphatase: 37 U/L — ABNORMAL LOW (ref 39–117)
BUN: 21 mg/dL (ref 6–23)
CALCIUM: 9 mg/dL (ref 8.4–10.5)
CO2: 26 meq/L (ref 19–32)
CREATININE: 1.7 mg/dL — AB (ref 0.50–1.35)
Chloride: 103 mEq/L (ref 96–112)
GFR, EST AFRICAN AMERICAN: 48 mL/min — AB
GFR, Est Non African American: 42 mL/min — ABNORMAL LOW
Glucose, Bld: 98 mg/dL (ref 70–99)
Potassium: 5.2 mEq/L (ref 3.5–5.3)
Sodium: 135 mEq/L (ref 135–145)
Total Bilirubin: 0.3 mg/dL (ref 0.2–1.2)
Total Protein: 6.3 g/dL (ref 6.0–8.3)

## 2014-09-30 NOTE — Telephone Encounter (Signed)
Patient was given Lamisil for toe nail.   MD to be made aware.

## 2014-09-30 NOTE — Telephone Encounter (Signed)
noted 

## 2014-09-30 NOTE — Telephone Encounter (Signed)
[phone]  Pt wife states that the medication that was given to her husband for his toe nail fungus also helped clear up a issue with his hand.

## 2014-10-01 ENCOUNTER — Encounter: Payer: Self-pay | Admitting: Family Medicine

## 2014-10-18 ENCOUNTER — Other Ambulatory Visit: Payer: Self-pay | Admitting: Family Medicine

## 2014-10-18 NOTE — Telephone Encounter (Signed)
Medication refilled per protocol. 

## 2014-10-25 ENCOUNTER — Other Ambulatory Visit: Payer: Self-pay | Admitting: Family Medicine

## 2014-10-25 ENCOUNTER — Encounter: Payer: Self-pay | Admitting: Family Medicine

## 2014-10-25 MED ORDER — HYDROCODONE-ACETAMINOPHEN 5-325 MG PO TABS: 1.0000 | ORAL_TABLET | Freq: Four times a day (QID) | ORAL | Status: DC | PRN

## 2014-11-04 ENCOUNTER — Other Ambulatory Visit: Payer: Self-pay | Admitting: Family Medicine

## 2014-11-04 ENCOUNTER — Encounter: Payer: Self-pay | Admitting: Family Medicine

## 2014-11-04 ENCOUNTER — Other Ambulatory Visit: Payer: Self-pay | Admitting: *Deleted

## 2014-11-04 DIAGNOSIS — Z79899 Other long term (current) drug therapy: Secondary | ICD-10-CM

## 2014-11-04 MED ORDER — TERBINAFINE HCL 250 MG PO TABS: 250.0000 mg | ORAL_TABLET | Freq: Every day | ORAL | Status: DC

## 2014-11-07 ENCOUNTER — Other Ambulatory Visit: Payer: Medicare Other

## 2014-11-07 DIAGNOSIS — Z79899 Other long term (current) drug therapy: Secondary | ICD-10-CM

## 2014-11-07 LAB — COMPLETE METABOLIC PANEL WITH GFR
ALK PHOS: 39 U/L (ref 39–117)
ALT: 10 U/L (ref 0–53)
AST: 12 U/L (ref 0–37)
Albumin: 4 g/dL (ref 3.5–5.2)
BILIRUBIN TOTAL: 0.5 mg/dL (ref 0.2–1.2)
BUN: 15 mg/dL (ref 6–23)
CO2: 26 mEq/L (ref 19–32)
CREATININE: 1.52 mg/dL — AB (ref 0.50–1.35)
Calcium: 9.2 mg/dL (ref 8.4–10.5)
Chloride: 105 mEq/L (ref 96–112)
GFR, Est African American: 55 mL/min — ABNORMAL LOW
GFR, Est Non African American: 48 mL/min — ABNORMAL LOW
Glucose, Bld: 92 mg/dL (ref 70–99)
Potassium: 5.3 mEq/L (ref 3.5–5.3)
Sodium: 138 mEq/L (ref 135–145)
Total Protein: 6.1 g/dL (ref 6.0–8.3)

## 2014-11-18 ENCOUNTER — Encounter: Payer: Self-pay | Admitting: Family Medicine

## 2014-11-19 MED ORDER — HYDROCODONE-ACETAMINOPHEN 5-325 MG PO TABS: 1.0000 | ORAL_TABLET | Freq: Four times a day (QID) | ORAL | Status: DC | PRN

## 2014-11-19 NOTE — Telephone Encounter (Signed)
Prescription printed and patient made aware to come to office to pick up per MD approval.

## 2014-12-04 ENCOUNTER — Other Ambulatory Visit: Payer: Self-pay | Admitting: Family Medicine

## 2014-12-04 NOTE — Telephone Encounter (Signed)
Refill appropriate and filled per protocol. 

## 2014-12-06 DIAGNOSIS — C9110 Chronic lymphocytic leukemia of B-cell type not having achieved remission: Secondary | ICD-10-CM

## 2014-12-06 HISTORY — DX: Chronic lymphocytic leukemia of B-cell type not having achieved remission: C91.10

## 2014-12-10 ENCOUNTER — Encounter: Payer: Self-pay | Admitting: Family Medicine

## 2014-12-10 ENCOUNTER — Ambulatory Visit (INDEPENDENT_AMBULATORY_CARE_PROVIDER_SITE_OTHER): Payer: 59 | Admitting: Family Medicine

## 2014-12-10 VITALS — BP 134/68 | HR 68 | Temp 97.8°F | Resp 14 | Ht 66.0 in | Wt 258.0 lb

## 2014-12-10 DIAGNOSIS — M7701 Medial epicondylitis, right elbow: Secondary | ICD-10-CM | POA: Diagnosis not present

## 2014-12-10 DIAGNOSIS — J449 Chronic obstructive pulmonary disease, unspecified: Secondary | ICD-10-CM

## 2014-12-10 DIAGNOSIS — E038 Other specified hypothyroidism: Secondary | ICD-10-CM

## 2014-12-10 DIAGNOSIS — B351 Tinea unguium: Secondary | ICD-10-CM

## 2014-12-10 DIAGNOSIS — F329 Major depressive disorder, single episode, unspecified: Secondary | ICD-10-CM | POA: Diagnosis not present

## 2014-12-10 DIAGNOSIS — F32A Depression, unspecified: Secondary | ICD-10-CM | POA: Insufficient documentation

## 2014-12-10 DIAGNOSIS — E291 Testicular hypofunction: Secondary | ICD-10-CM

## 2014-12-10 DIAGNOSIS — R7989 Other specified abnormal findings of blood chemistry: Secondary | ICD-10-CM | POA: Insufficient documentation

## 2014-12-10 LAB — T3, FREE: T3, Free: 3.6 pg/mL (ref 2.3–4.2)

## 2014-12-10 LAB — TSH: TSH: 3.851 u[IU]/mL (ref 0.350–4.500)

## 2014-12-10 LAB — TESTOSTERONE: Testosterone: 113 ng/dL — ABNORMAL LOW (ref 300–890)

## 2014-12-10 MED ORDER — METHYLPREDNISOLONE (PAK) 4 MG PO TABS: ORAL_TABLET | ORAL | Status: DC

## 2014-12-10 MED ORDER — HYDROCODONE-ACETAMINOPHEN 5-325 MG PO TABS: 1.0000 | ORAL_TABLET | Freq: Four times a day (QID) | ORAL | Status: DC | PRN

## 2014-12-10 NOTE — Assessment & Plan Note (Signed)
New nail looks fungus free, no further lamisil at this time Regarding brittle nails, restart Calcium in forms of TUMS

## 2014-12-10 NOTE — Progress Notes (Signed)
Patient ID: [name] Ball, male   DOB: May 30, 1951, 64 y.o.   MRN: [id number]   Subjective:    Patient ID: [name] Ball, male    DOB: 11-Nov-1951, 64 y.o.   MRN: [id number]  Patient presents for Fungal Medication review; Depression; Brittle Nails; and R Elbow Pain patient here to follow-up chronic medical problems. He will like for me to reevaluate his toe he was on Lamisil this actually helped clear up the nail fungus near the matrix he still has some thickening and yellowing at the tip. This also helped clear up the skin problems on his hands he took a total of 4 months of the medication we are now waiting to see how it will do off of it.  He's had right elbow pain for the past couple months he injured himself when his arm was rotated in awkward position secondary to trying to grab his dog. He's had some pain over the medial epicondyle ever since. When he wears a compression wrap this helps the pain. He did have some mild swelling initially.  He has noticed that his nails are more brittle the past couple months.  He's also been more depressed the past few months. It has been a lot going on in his home as well as his own health. He finds himself very tearful very easily. He denies suicidal ideations. His sleep is fair. He's never been on any antidepressant medications but he and his wife are concerned he may need something. He is still on testosterone injections however they did miss a few he is also taking his thyroid medicine as prescribed. He is not sure if any of these can also be contributing to his low mood or forgetfulness  Review Of Systems:  GEN- denies fatigue, fever, weight loss,weakness, recent illness HEENT- denies eye drainage, change in vision, nasal discharge, CVS- denies chest pain, palpitations RESP- denies SOB, cough, wheeze ABD- denies N/V, change in stools, abd pain GU- denies dysuria, hematuria, dribbling, incontinence MSK-+joint pain, muscle aches, injury Neuro-  denies headache, dizziness, syncope, seizure activity       Objective:    BP 134/68 mmHg  Pulse 68  Temp(Src) 97.8 F (36.6 C) (Oral)  Resp 14  Ht 5\' 6"  (1.676 m)  Wt 258 lb (117.028 kg)  BMI 41.66 kg/m2 GEN- NAD, alert and oriented x3, deaf CVS- RRR, no murmur RESP-CTAB EXT- No edema Pulses- Radial 2+, DP 2+ Psych- normal affect and mood, well groomed no SI, good eye contact Skin- left great toe nail yellow discoloration- thickened nails, matrix/nail bed is clear  MSK- TTP medial epicondyle, pain with extension and flexion, pain with rotation at wrist, no erythema, no swelling      Assessment & Plan:      Problem List Items Addressed This Visit      Unprioritized   Medial epicondylitis of right elbow   Relevant Medications      Methylprednisolone (MEDROL PAK) 4 mg po tab      HYDROcodone-acetaminophen (NORCO) 5-325 MG per tablet   Low testosterone   Relevant Orders      Testosterone (Completed)   Hypothyroidism   Relevant Orders      TSH (Completed)      T3, free (Completed)   Depression - Primary      Note: This dictation was prepared with Dragon dictation along with smaller phrase technology. Any transcriptional errors that result from this process are unintentional.

## 2014-12-10 NOTE — Assessment & Plan Note (Signed)
Depressed symptoms, will recheck Thyroid and testosterone first, he has been on shot every 2 weeks Consider adding celexa 10mg  at bedtime if labs overall look okay

## 2014-12-10 NOTE — Assessment & Plan Note (Signed)
Currently stable, no change to meds 

## 2014-12-10 NOTE — Patient Instructions (Signed)
Take the medrol dosepak- steroids We will call with lab results  Continue all other medications F/U 4 months

## 2014-12-10 NOTE — Assessment & Plan Note (Signed)
Declines ortho at this time, unable to take NSAIDS due to renal function and history Trial of medrol dosepak, if not better, would get plain xray and revisit ortho again

## 2014-12-24 ENCOUNTER — Encounter: Payer: Self-pay | Admitting: Family Medicine

## 2014-12-24 MED ORDER — DOXYCYCLINE HYCLATE 100 MG PO TABS: 100.0000 mg | ORAL_TABLET | Freq: Two times a day (BID) | ORAL | Status: DC

## 2014-12-28 DIAGNOSIS — J449 Chronic obstructive pulmonary disease, unspecified: Secondary | ICD-10-CM | POA: Diagnosis not present

## 2014-12-30 ENCOUNTER — Encounter: Payer: Self-pay | Admitting: Family Medicine

## 2014-12-30 ENCOUNTER — Other Ambulatory Visit: Payer: Self-pay | Admitting: Family Medicine

## 2014-12-30 MED ORDER — HYDROCODONE-ACETAMINOPHEN 5-325 MG PO TABS: 1.0000 | ORAL_TABLET | Freq: Four times a day (QID) | ORAL | Status: DC | PRN

## 2014-12-31 ENCOUNTER — Other Ambulatory Visit: Payer: 59

## 2014-12-31 ENCOUNTER — Other Ambulatory Visit: Payer: Self-pay | Admitting: Family Medicine

## 2014-12-31 DIAGNOSIS — E291 Testicular hypofunction: Secondary | ICD-10-CM

## 2014-12-31 DIAGNOSIS — G4733 Obstructive sleep apnea (adult) (pediatric): Secondary | ICD-10-CM | POA: Diagnosis not present

## 2014-12-31 DIAGNOSIS — E785 Hyperlipidemia, unspecified: Secondary | ICD-10-CM

## 2014-12-31 NOTE — Telephone Encounter (Signed)
Medication refilled per protocol. 

## 2015-01-01 LAB — LIPID PANEL
Cholesterol: 154 mg/dL (ref 0–200)
HDL: 36 mg/dL — ABNORMAL LOW (ref >39)
LDL CALC: 72 mg/dL (ref 0–99)
TRIGLYCERIDES: 229 mg/dL — AB (ref <150)
Total CHOL/HDL Ratio: 4.3 Ratio
VLDL: 46 mg/dL — ABNORMAL HIGH (ref 0–40)

## 2015-01-01 LAB — TESTOSTERONE: Testosterone: 477 ng/dL (ref 300–890)

## 2015-01-03 ENCOUNTER — Telehealth: Payer: Self-pay | Admitting: Family Medicine

## 2015-01-03 MED ORDER — CITALOPRAM HYDROBROMIDE 10 MG PO TABS: 10.0000 mg | ORAL_TABLET | Freq: Every day | ORAL | Status: DC

## 2015-01-03 NOTE — Telephone Encounter (Signed)
Send Celexa 10mg  once a day

## 2015-01-03 NOTE — Telephone Encounter (Signed)
Patients wife calling to say that when he saw dr Buelah Manis she had mentioned getting a mild antidepressant and he would like to do this, and it is to be called into [facility] [place]

## 2015-01-03 NOTE — Telephone Encounter (Signed)
Pt called, spoke to wife.  Aware of Rx.  Told can take one full month to see full benefit.  Let us know how it is working after first month and can refill further from there

## 2015-01-28 ENCOUNTER — Encounter: Payer: Self-pay | Admitting: Family Medicine

## 2015-01-28 DIAGNOSIS — J449 Chronic obstructive pulmonary disease, unspecified: Secondary | ICD-10-CM | POA: Diagnosis not present

## 2015-01-29 MED ORDER — HYDROCODONE-ACETAMINOPHEN 5-325 MG PO TABS: 1.0000 | ORAL_TABLET | Freq: Four times a day (QID) | ORAL | Status: DC | PRN

## 2015-02-07 ENCOUNTER — Encounter: Payer: Self-pay | Admitting: Family Medicine

## 2015-02-07 MED ORDER — DOXYCYCLINE HYCLATE 100 MG PO TABS: 100.0000 mg | ORAL_TABLET | Freq: Two times a day (BID) | ORAL | Status: DC

## 2015-02-07 MED ORDER — PREDNISONE 10 MG PO TABS: ORAL_TABLET | ORAL | Status: DC

## 2015-02-18 ENCOUNTER — Encounter: Payer: Self-pay | Admitting: Family Medicine

## 2015-02-18 DIAGNOSIS — B351 Tinea unguium: Secondary | ICD-10-CM

## 2015-02-18 DIAGNOSIS — Z79899 Other long term (current) drug therapy: Secondary | ICD-10-CM

## 2015-02-18 MED ORDER — TERBINAFINE HCL 250 MG PO TABS: 250.0000 mg | ORAL_TABLET | Freq: Every day | ORAL | Status: DC

## 2015-02-18 MED ORDER — HYDROCODONE-ACETAMINOPHEN 5-325 MG PO TABS: 1.0000 | ORAL_TABLET | Freq: Four times a day (QID) | ORAL | Status: DC | PRN

## 2015-02-25 ENCOUNTER — Other Ambulatory Visit: Payer: Self-pay | Admitting: Family Medicine

## 2015-02-26 DIAGNOSIS — J449 Chronic obstructive pulmonary disease, unspecified: Secondary | ICD-10-CM | POA: Diagnosis not present

## 2015-02-26 NOTE — Telephone Encounter (Signed)
Medication refilled per protocol. 

## 2015-03-12 ENCOUNTER — Encounter: Payer: Self-pay | Admitting: Family Medicine

## 2015-03-13 ENCOUNTER — Encounter: Payer: Self-pay | Admitting: Family Medicine

## 2015-03-13 ENCOUNTER — Encounter: Payer: Self-pay | Admitting: Physician Assistant

## 2015-03-13 ENCOUNTER — Ambulatory Visit (INDEPENDENT_AMBULATORY_CARE_PROVIDER_SITE_OTHER): Payer: Medicare Other | Admitting: Physician Assistant

## 2015-03-13 VITALS — BP 140/86 | HR 64 | Temp 97.3°F | Resp 20 | Wt 261.0 lb

## 2015-03-13 DIAGNOSIS — G894 Chronic pain syndrome: Secondary | ICD-10-CM | POA: Diagnosis not present

## 2015-03-13 DIAGNOSIS — J449 Chronic obstructive pulmonary disease, unspecified: Secondary | ICD-10-CM | POA: Diagnosis not present

## 2015-03-13 MED ORDER — HYDROCODONE-ACETAMINOPHEN 5-325 MG PO TABS: 1.0000 | ORAL_TABLET | Freq: Four times a day (QID) | ORAL | Status: DC | PRN

## 2015-03-13 MED ORDER — PREDNISONE 20 MG PO TABS: ORAL_TABLET | ORAL | Status: DC

## 2015-03-13 NOTE — Progress Notes (Signed)
Patient ID: [name] Ball MRN: [id number], DOB: 08/12/1951, 64 y.o. Date of Encounter: 03/13/2015, 4:40 PM    Chief Complaint:  Chief Complaint  Patient presents with  . lungs burning    inhaled bug spray 2 days ago     HPI: 64 y.o. year old white male here with his wife for visit. He is deaf and she does sign language to translate/interpret for him.  He has history of COPD. On Monday night they had seen some ants in their house so they were using some spray.  The wife did not realize that he was still in the room when she used the spray.  She says that she was spraying down towards the floor where the ants were.  The spray did not go directly toward the patient.  Says that they were in a small bathroom. Says that after spraying, they were both coughing.  However, her cough quickly resolved and she has had no further symptoms. However, for him- given his history of COPD, he has continued to complain of a burning sensation in his lungs/chest. They state that he has been using his Atrovent 2 times a day and the Xopenox once a day and also using his BiPAP. He says that he really is not feeling any increased shortness of breath or difficulty breathing but mostly just this burning sensation.     Home Meds:   Outpatient Prescriptions Prior to Visit  Medication Sig Dispense Refill  . acetaminophen (TYLENOL) 325 MG tablet Take 650 mg by mouth every 6 (six) hours as needed.    Marland Kitchen allopurinol (ZYLOPRIM) 100 MG tablet Take 1 tablet (100 mg total) by mouth at bedtime. 90 tablet 3  . amLODipine (NORVASC) 5 MG tablet TAKE 1 TABLET BY MOUTH EVERY DAY FOR BLOOD PRESSURE 30 tablet 6  . ASMANEX 60 METERED DOSES 220 MCG/INH inhaler INHALE 2 PUFFS INTO THE LUNGS TWICE DAILY 1 Inhaler 1  . calcium carbonate (TUMS - DOSED IN MG ELEMENTAL CALCIUM) 500 MG chewable tablet Chew 2-3 tablets by mouth once as needed for indigestion or heartburn.    Marland Kitchen DEXILANT 60 MG capsule TAKE 1 CAPSULE BY MOUTH EVERY  DAY 30 capsule 11  . diphenhydrAMINE (BENADRYL) 25 MG tablet Take 25 mg by mouth every evening.     . eletriptan (RELPAX) 20 MG tablet Take 1 tablet (20 mg total) by mouth as needed for migraine. One tablet by mouth at onset of headache. May repeat in 2 hours if headache persists or recurs. 10 tablet 3  . ipratropium (ATROVENT HFA) 17 MCG/ACT inhaler Inhale 2 puffs into the lungs every 6 (six) hours. 1 Inhaler 11  . levalbuterol (XOPENEX HFA) 45 MCG/ACT inhaler Inhale 1-2 puffs into the lungs every 4 (four) hours as needed for wheezing. 1 Inhaler 6  . levothyroxine (SYNTHROID, LEVOTHROID) 50 MCG tablet Take 50 mcg by mouth daily before breakfast.    . loratadine (CLARITIN) 10 MG tablet Take 10 mg by mouth every morning.     . metoprolol (LOPRESSOR) 50 MG tablet TAKE 1 TABLET BY MOUTH TWICE DAILY 60 tablet 3  . terbinafine (LAMISIL) 250 MG tablet Take 1 tablet (250 mg total) by mouth daily. 30 tablet 2  . testosterone cypionate (DEPOTESTOTERONE CYPIONATE) 200 MG/ML injection INJECT 1 ML IN THE MUSCLE EVERY 14 DAYS 10 mL 2  . vitamin B-12 (CYANOCOBALAMIN) 1000 MCG tablet Take 1,000 mcg by mouth every morning.     . vitamin E 400 UNIT capsule Take  400 Units by mouth every morning.     . clobetasol cream (TEMOVATE) 1.69 % Apply 1 application topically 2 (two) times daily.    Marland Kitchen HYDROcodone-acetaminophen (NORCO) 5-325 MG per tablet Take 1-2 tablets by mouth every 6 (six) hours as needed. 90 tablet 0  . cholecalciferol (VITAMIN D) 1000 UNITS tablet Take 1,000 Units by mouth every morning.     . citalopram (CELEXA) 10 MG tablet TAKE 1 TABLET BY MOUTH EVERY DAY 30 tablet 1  . clotrimazole-betamethasone (LOTRISONE) cream Apply 1 application topically 2 (two) times daily. 45 g 1  . doxycycline (VIBRA-TABS) 100 MG tablet Take 1 tablet (100 mg total) by mouth 2 (two) times daily. 14 tablet 0  . predniSONE (DELTASONE) 10 MG tablet 40mg  x 3 days, 20mg  x 3 days, 10 mg x 3 days 21 tablet 0   No  facility-administered medications prior to visit.    Allergies:  Allergies  Allergen Reactions  . Strawberry Hives  . Ether Rash    Took a long time to wake up Doesn't wake up from it      Review of Systems: See HPI for pertinent ROS. All other ROS negative.    Physical Exam: Blood pressure 140/86, pulse 64, temperature 97.3 F (36.3 C), temperature source Oral, resp. rate 20, weight 261 lb (118.389 kg), SpO2 98 %., Body mass index is 42.15 kg/(m^2). General:  Obese WM with Abdominal Obesity. Appears in no acute distress. Neck: Supple. No thyromegaly. No lymphadenopathy. Lungs: Clear bilaterally to auscultation without wheezes, rales, or rhonchi. Breathing is unlabored. Lungs are clear throughout. Hear no wheezes. He has good breath sounds even at the bases so no signs of effusion or pulmonary edema. No rhonchi or rales. Heart: Regular rhythm. No murmurs, rubs, or gallops. Msk:  Strength and tone normal for age. Extremities/Skin: Warm and dry. Neuro: Alert and oriented X 3. Moves all extremities spontaneously. Gait is normal. CNII-XII grossly in tact. Psych:  Responds to questions appropriately with a normal affect.     ASSESSMENT AND PLAN:  64 y.o. year old male with  1. Chronic obstructive pulmonary disease, unspecified COPD, unspecified chronic bronchitis type - predniSONE (DELTASONE) 20 MG tablet; Take 3 daily for 2 days, then 2 daily for 2 days, then 1 daily for 2 days.  Dispense: 12 tablet; Refill: 0  2. Chronic pain syndrome - HYDROcodone-acetaminophen (NORCO) 5-325 MG per tablet; Take 1-2 tablets by mouth every 6 (six) hours as needed.  Dispense: 90 tablet; Refill: 0   Discussed obtaining chest x-ray to rule out pulmonary edema and to further assess.  Patient and wife both defer. Will treat with prednisone. Told them that hopefully the prednisone will help, but that otherwise it is just going to take time for that area to heal from chemical burn from the chemicals in  the spray.  [address] [place], [place], [facility] 03/13/2015 4:40 PM

## 2015-03-16 ENCOUNTER — Encounter: Payer: Self-pay | Admitting: Family Medicine

## 2015-03-16 ENCOUNTER — Encounter: Payer: Self-pay | Admitting: Physician Assistant

## 2015-03-19 ENCOUNTER — Encounter: Payer: Self-pay | Admitting: Family Medicine

## 2015-03-19 MED ORDER — AMLODIPINE BESYLATE 5 MG PO TABS: 5.0000 mg | ORAL_TABLET | Freq: Every day | ORAL | Status: DC

## 2015-03-19 MED ORDER — TESTOSTERONE CYPIONATE 200 MG/ML IM SOLN: INTRAMUSCULAR | Status: DC

## 2015-03-26 ENCOUNTER — Other Ambulatory Visit: Payer: Self-pay | Admitting: Family Medicine

## 2015-03-27 NOTE — Telephone Encounter (Signed)
Medication refilled per protocol. 

## 2015-03-29 DIAGNOSIS — J449 Chronic obstructive pulmonary disease, unspecified: Secondary | ICD-10-CM | POA: Diagnosis not present

## 2015-04-01 DIAGNOSIS — G4733 Obstructive sleep apnea (adult) (pediatric): Secondary | ICD-10-CM | POA: Diagnosis not present

## 2015-04-03 ENCOUNTER — Other Ambulatory Visit: Payer: Medicare Other

## 2015-04-03 ENCOUNTER — Encounter: Payer: Self-pay | Admitting: Family Medicine

## 2015-04-03 ENCOUNTER — Other Ambulatory Visit: Payer: Self-pay | Admitting: Family Medicine

## 2015-04-03 DIAGNOSIS — E785 Hyperlipidemia, unspecified: Secondary | ICD-10-CM

## 2015-04-03 DIAGNOSIS — B351 Tinea unguium: Secondary | ICD-10-CM

## 2015-04-03 DIAGNOSIS — R7309 Other abnormal glucose: Secondary | ICD-10-CM | POA: Diagnosis not present

## 2015-04-03 DIAGNOSIS — E291 Testicular hypofunction: Secondary | ICD-10-CM

## 2015-04-03 DIAGNOSIS — Z79899 Other long term (current) drug therapy: Secondary | ICD-10-CM | POA: Diagnosis not present

## 2015-04-03 DIAGNOSIS — G894 Chronic pain syndrome: Secondary | ICD-10-CM

## 2015-04-03 MED ORDER — HYDROCODONE-ACETAMINOPHEN 5-325 MG PO TABS: 1.0000 | ORAL_TABLET | Freq: Four times a day (QID) | ORAL | Status: DC | PRN

## 2015-04-04 LAB — COMPLETE METABOLIC PANEL WITH GFR
ALBUMIN: 4 g/dL (ref 3.5–5.2)
ALK PHOS: 30 U/L — AB (ref 39–117)
ALT: 10 U/L (ref 0–53)
AST: 11 U/L (ref 0–37)
BILIRUBIN TOTAL: 0.6 mg/dL (ref 0.2–1.2)
BUN: 18 mg/dL (ref 6–23)
CALCIUM: 8.6 mg/dL (ref 8.4–10.5)
CHLORIDE: 106 meq/L (ref 96–112)
CO2: 27 mEq/L (ref 19–32)
Creat: 1.56 mg/dL — ABNORMAL HIGH (ref 0.50–1.35)
GFR, EST NON AFRICAN AMERICAN: 46 mL/min — AB
GFR, Est African American: 53 mL/min — ABNORMAL LOW
Glucose, Bld: 109 mg/dL — ABNORMAL HIGH (ref 70–99)
POTASSIUM: 4.8 meq/L (ref 3.5–5.3)
SODIUM: 138 meq/L (ref 135–145)
Total Protein: 5.7 g/dL — ABNORMAL LOW (ref 6.0–8.3)

## 2015-04-04 LAB — LIPID PANEL
CHOLESTEROL: 137 mg/dL (ref 0–200)
HDL: 18 mg/dL — AB (ref >=40)
LDL Cholesterol: 62 mg/dL (ref 0–99)
TRIGLYCERIDES: 286 mg/dL — AB (ref <150)
Total CHOL/HDL Ratio: 7.6 Ratio
VLDL: 57 mg/dL — ABNORMAL HIGH (ref 0–40)

## 2015-04-04 LAB — HEMOGLOBIN A1C
Hgb A1c MFr Bld: 6.4 % — ABNORMAL HIGH (ref <5.7)
Mean Plasma Glucose: 137 mg/dL — ABNORMAL HIGH (ref <117)

## 2015-04-04 LAB — TESTOSTERONE: Testosterone: 943 ng/dL — ABNORMAL HIGH (ref 300–890)

## 2015-04-07 ENCOUNTER — Telehealth: Payer: Self-pay | Admitting: *Deleted

## 2015-04-07 NOTE — Telephone Encounter (Signed)
Received call from patient wife, Zigmund Daniel.   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 States that hh has been having slight difficulty breathing since then.   Reports that he has been using his inhalers and Bi-Pap as directed, but requested order for Prednisone.  States that patient has had this effect in the past and Prednisone is effective.   MD please advise.

## 2015-04-07 NOTE — Telephone Encounter (Signed)
Agree with above 

## 2015-04-07 NOTE — Telephone Encounter (Signed)
Spoke with MD who advised that patient will need to be seen.   Appointment scheduled for 04/08/2015.

## 2015-04-08 ENCOUNTER — Ambulatory Visit (INDEPENDENT_AMBULATORY_CARE_PROVIDER_SITE_OTHER): Payer: Medicare Other | Admitting: Family Medicine

## 2015-04-08 ENCOUNTER — Encounter: Payer: Self-pay | Admitting: Family Medicine

## 2015-04-08 VITALS — BP 164/94 | HR 76 | Temp 97.6°F | Resp 18 | Ht 66.0 in | Wt 261.0 lb

## 2015-04-08 DIAGNOSIS — N183 Chronic kidney disease, stage 3 unspecified: Secondary | ICD-10-CM

## 2015-04-08 DIAGNOSIS — J4489 Other specified chronic obstructive pulmonary disease: Secondary | ICD-10-CM

## 2015-04-08 DIAGNOSIS — J449 Chronic obstructive pulmonary disease, unspecified: Secondary | ICD-10-CM

## 2015-04-08 DIAGNOSIS — B351 Tinea unguium: Secondary | ICD-10-CM

## 2015-04-08 DIAGNOSIS — J45901 Unspecified asthma with (acute) exacerbation: Principal | ICD-10-CM

## 2015-04-08 DIAGNOSIS — G894 Chronic pain syndrome: Secondary | ICD-10-CM | POA: Diagnosis not present

## 2015-04-08 DIAGNOSIS — I10 Essential (primary) hypertension: Secondary | ICD-10-CM | POA: Diagnosis not present

## 2015-04-08 DIAGNOSIS — G4730 Sleep apnea, unspecified: Secondary | ICD-10-CM

## 2015-04-08 DIAGNOSIS — E291 Testicular hypofunction: Secondary | ICD-10-CM | POA: Diagnosis not present

## 2015-04-08 DIAGNOSIS — R7309 Other abnormal glucose: Secondary | ICD-10-CM

## 2015-04-08 DIAGNOSIS — R7303 Prediabetes: Secondary | ICD-10-CM

## 2015-04-08 DIAGNOSIS — J441 Chronic obstructive pulmonary disease with (acute) exacerbation: Secondary | ICD-10-CM

## 2015-04-08 DIAGNOSIS — R7989 Other specified abnormal findings of blood chemistry: Secondary | ICD-10-CM

## 2015-04-08 MED ORDER — IPRATROPIUM-ALBUTEROL 0.5-2.5 (3) MG/3ML IN SOLN
3.0000 mL | Freq: Once | RESPIRATORY_TRACT | Status: AC
  Administered 2015-04-08: 3 mL via RESPIRATORY_TRACT

## 2015-04-08 MED ORDER — PREDNISONE 20 MG PO TABS: ORAL_TABLET | ORAL | Status: DC

## 2015-04-08 MED ORDER — HYDROCODONE-ACETAMINOPHEN 10-325 MG PO TABS: 1.0000 | ORAL_TABLET | Freq: Three times a day (TID) | ORAL | Status: DC | PRN

## 2015-04-08 MED ORDER — METHYLPREDNISOLONE ACETATE 40 MG/ML IJ SUSP
60.0000 mg | Freq: Once | INTRAMUSCULAR | Status: AC
Start: 2015-04-08 — End: 2015-04-08
  Administered 2015-04-08: 60 mg via INTRAMUSCULAR

## 2015-04-08 MED ORDER — ALBUTEROL SULFATE (2.5 MG/3ML) 0.083% IN NEBU: 2.5000 mg | INHALATION_SOLUTION | Freq: Four times a day (QID) | RESPIRATORY_TRACT | Status: DC | PRN

## 2015-04-08 MED ORDER — CITALOPRAM HYDROBROMIDE 10 MG PO TABS: 10.0000 mg | ORAL_TABLET | Freq: Every day | ORAL | Status: DC

## 2015-04-08 NOTE — Assessment & Plan Note (Signed)
Currently stable Cr 1.5

## 2015-04-08 NOTE — Assessment & Plan Note (Signed)
Testosterone level over shot I think is because a review more compliant with the dosing. On the drop back down to 200 mg  Every 2 weeks

## 2015-04-08 NOTE — Patient Instructions (Addendum)
Decrease testosterone to 53ml every 2 weeks Take prednisone starting tomorrow  Use nebulizer as prescribed Work on diet and weight loss Change in pain pill  F/U in August for fasting labs

## 2015-04-08 NOTE — Assessment & Plan Note (Signed)
Blood pressure little elevated today however in mild distress. Typically well controlled we'll not change at this time

## 2015-04-08 NOTE — Assessment & Plan Note (Signed)
change to hydrocodone 10/325 #90 per month.

## 2015-04-08 NOTE — Progress Notes (Signed)
S/p neb- improved WOB, improved air movement, no wheeze

## 2015-04-08 NOTE — Progress Notes (Signed)
Patient ID: [name] Ball, male   DOB: 25-Mar-1951, 64 y.o.   MRN: [id number]   Subjective:    Patient ID: [name] Ball, male    DOB: [birth year], 64 y.o.   MRN: [id number]  Patient presents for SOB/ Wheezing   H a year with shortness of breath and wheezing for the past 3 days. He was actually working on old car with transmission when out and he was inhaling some fumes when he began to get short of breath and started coughing. He has no COPD with asthma. He did use his home inhalers which helped some he was starting to improve but then again having coughing fits and wheezing again. He had a similar instance where he inhaled some Ant spray and this set off his COPD. He has not had any fever. His cough is nonproductive. No other over-the-counter medications have been given. We also reviewed his labs which turned out to be nonfasting. His A1c was elevated at 6.4% he has gained some weight since her last visit.  His testosterone was also elevated   review of his medications his pain medicine he typically takes 1-2 tablets up to 3 times a day. We discussed maybe increasing the dose that he does not take his many pills throughout the day Review Of Systems:  GEN- denies fatigue, fever, weight loss,weakness, recent illness HEENT- denies eye drainage, change in vision, nasal discharge, CVS- denies chest pain, palpitations RESP- + SOB, +cough, +wheeze ABD- denies N/V, change in stools, abd pain GU- denies dysuria, hematuria, dribbling, incontinence MSK- + joint pain, muscle aches, injury Neuro- denies headache, dizziness, syncope, seizure activity       Objective:    BP 164/94 mmHg  Pulse 76  Temp(Src) 97.6 F (36.4 C) (Oral)  Resp 18  Ht 5\' 6"  (1.676 m)  Wt 261 lb (118.389 kg)  BMI 42.15 kg/m2  SpO2 97% GEN- NAD, alert and oriented x3 HEENT- PERRL, EOMI, non injected sclera, pink conjunctiva, MMM, oropharynx clear Neck- Supple, no JVD CVS- RRR, no murmur RESP-bilat bronchspasm,  decreased BS at bases, no rales, mild rhonchi cleared with cough, increased WOB, sat 94% ABD-NABS,soft,NT,ND EXT- No edema Pulses- Radial, DP- 2+ Skin- eczematous lesions on bilat hands and palms, thickened Right Great toenail yellow nail, nail base is clear        Assessment & Plan:      Problem List Items Addressed This Visit    Prediabetes   Onychomycosis   Essential hypertension, benign   COPD with asthma   Relevant Medications   methylPREDNISolone acetate (DEPO-MEDROL) injection 60 mg (Completed)   ipratropium-albuterol (DUONEB) 0.5-2.5 (3) MG/3ML nebulizer solution 3 mL (Completed)   albuterol (PROVENTIL) (2.5 MG/3ML) 0.083% nebulizer solution   predniSONE (DELTASONE) 20 MG tablet   Chronic pain syndrome    Other Visit Diagnoses    Chronic obstructive asthma with exacerbation    -  Primary    2/2 to irritants, discussed wearing mask and avoiding fumes, given Duoneb in office responded quite well, Depo Medrol shot, plan taper and home nebulizer    Relevant Medications    methylPREDNISolone acetate (DEPO-MEDROL) injection 60 mg (Completed)    ipratropium-albuterol (DUONEB) 0.5-2.5 (3) MG/3ML nebulizer solution 3 mL (Completed)    albuterol (PROVENTIL) (2.5 MG/3ML) 0.083% nebulizer solution    predniSONE (DELTASONE) 20 MG tablet    Chronic obstructive pulmonary disease, unspecified COPD, unspecified chronic bronchitis type        Relevant Medications    methylPREDNISolone acetate (DEPO-MEDROL) injection  60 mg (Completed)    ipratropium-albuterol (DUONEB) 0.5-2.5 (3) MG/3ML nebulizer solution 3 mL (Completed)    albuterol (PROVENTIL) (2.5 MG/3ML) 0.083% nebulizer solution    predniSONE (DELTASONE) 20 MG tablet       Note: This dictation was prepared with Dragon dictation along with smaller phrase technology. Any transcriptional errors that result from this process are unintentional.

## 2015-04-08 NOTE — Assessment & Plan Note (Signed)
He will complete the course of Lamisil and then follow-up with his dermatologist I think it is working on the toenail

## 2015-04-08 NOTE — Assessment & Plan Note (Signed)
Discussed dietary changes. We will recheck his labs in 3 months

## 2015-04-08 NOTE — Assessment & Plan Note (Signed)
Continue baseline medications at this time as well. On discussion at the nebulizer. He definitely was exposed to send copy monoxide that contributed to his problems.

## 2015-04-21 ENCOUNTER — Other Ambulatory Visit: Payer: Self-pay | Admitting: Family Medicine

## 2015-04-23 ENCOUNTER — Other Ambulatory Visit: Payer: Self-pay | Admitting: Family Medicine

## 2015-04-23 NOTE — Telephone Encounter (Signed)
Okay to refill? 

## 2015-04-23 NOTE — Telephone Encounter (Signed)
Ok refill? 

## 2015-04-24 ENCOUNTER — Encounter: Payer: Self-pay | Admitting: Family Medicine

## 2015-04-25 MED ORDER — PREDNISONE 20 MG PO TABS: ORAL_TABLET | ORAL | Status: DC

## 2015-04-28 DIAGNOSIS — J449 Chronic obstructive pulmonary disease, unspecified: Secondary | ICD-10-CM | POA: Diagnosis not present

## 2015-04-29 NOTE — Assessment & Plan Note (Signed)
He continues to benefit from use of BIPAP and requires at bedtime

## 2015-05-09 DIAGNOSIS — J449 Chronic obstructive pulmonary disease, unspecified: Secondary | ICD-10-CM | POA: Diagnosis not present

## 2015-05-13 ENCOUNTER — Telehealth: Payer: Self-pay | Admitting: *Deleted

## 2015-05-13 ENCOUNTER — Encounter: Payer: Self-pay | Admitting: Family Medicine

## 2015-05-13 NOTE — Telephone Encounter (Signed)
Received request from pharmacy for PA on Asmanex.   PA submitted.   Dx: X32.9

## 2015-05-14 NOTE — Telephone Encounter (Signed)
Received PA determination.   PA approved through 12/06/2015.  Ref #: [id number].  Pharmacy made aware.

## 2015-06-03 ENCOUNTER — Encounter: Payer: Self-pay | Admitting: Family Medicine

## 2015-06-03 MED ORDER — HYDROCODONE-ACETAMINOPHEN 10-325 MG PO TABS: 1.0000 | ORAL_TABLET | Freq: Three times a day (TID) | ORAL | Status: DC | PRN

## 2015-06-03 NOTE — Telephone Encounter (Signed)
LRF 04/14/15 #90  OK refill?

## 2015-06-08 DIAGNOSIS — J449 Chronic obstructive pulmonary disease, unspecified: Secondary | ICD-10-CM | POA: Diagnosis not present

## 2015-06-17 ENCOUNTER — Other Ambulatory Visit: Payer: Self-pay | Admitting: Family Medicine

## 2015-06-17 NOTE — Telephone Encounter (Signed)
Refill appropriate and filled per protocol. 

## 2015-07-01 ENCOUNTER — Encounter: Payer: Self-pay | Admitting: Family Medicine

## 2015-07-01 MED ORDER — HYDROCODONE-ACETAMINOPHEN 10-325 MG PO TABS: 1.0000 | ORAL_TABLET | Freq: Three times a day (TID) | ORAL | Status: DC | PRN

## 2015-07-02 DIAGNOSIS — G4733 Obstructive sleep apnea (adult) (pediatric): Secondary | ICD-10-CM | POA: Diagnosis not present

## 2015-07-06 ENCOUNTER — Other Ambulatory Visit: Payer: Self-pay | Admitting: Family Medicine

## 2015-07-07 NOTE — Telephone Encounter (Signed)
Refill appropriate and filled per protocol x1.   Patient to F/U this month for fasting labs,

## 2015-07-09 ENCOUNTER — Encounter: Payer: Self-pay | Admitting: Family Medicine

## 2015-07-09 DIAGNOSIS — J449 Chronic obstructive pulmonary disease, unspecified: Secondary | ICD-10-CM | POA: Diagnosis not present

## 2015-07-27 ENCOUNTER — Encounter: Payer: Self-pay | Admitting: Family Medicine

## 2015-07-28 MED ORDER — HYDROCODONE-ACETAMINOPHEN 10-325 MG PO TABS: 1.0000 | ORAL_TABLET | Freq: Three times a day (TID) | ORAL | Status: DC | PRN

## 2015-08-06 ENCOUNTER — Other Ambulatory Visit: Payer: Self-pay | Admitting: Family Medicine

## 2015-08-06 NOTE — Telephone Encounter (Signed)
Refill appropriate and filled per protocol. 

## 2015-08-07 ENCOUNTER — Other Ambulatory Visit: Payer: Self-pay | Admitting: Family Medicine

## 2015-08-07 NOTE — Telephone Encounter (Signed)
Refill appropriate and filled per protocol. 

## 2015-08-08 ENCOUNTER — Other Ambulatory Visit: Payer: Self-pay | Admitting: Family Medicine

## 2015-08-08 MED ORDER — MOMETASONE FUROATE 220 MCG/INH IN AEPB: 2.0000 | INHALATION_SPRAY | Freq: Two times a day (BID) | RESPIRATORY_TRACT | Status: AC

## 2015-08-08 NOTE — Telephone Encounter (Signed)
Medication refilled per protocol. 

## 2015-08-09 DIAGNOSIS — J449 Chronic obstructive pulmonary disease, unspecified: Secondary | ICD-10-CM | POA: Diagnosis not present

## 2015-08-19 ENCOUNTER — Telehealth: Payer: Self-pay | Admitting: *Deleted

## 2015-08-19 MED ORDER — HYDROCODONE-ACETAMINOPHEN 10-325 MG PO TABS: 1.0000 | ORAL_TABLET | Freq: Three times a day (TID) | ORAL | Status: DC | PRN

## 2015-08-19 NOTE — Telephone Encounter (Signed)
My Chart message sent

## 2015-08-19 NOTE — Telephone Encounter (Signed)
Noted prescription for Norco is due.   Ok to refill??  Last office visit 04/08/2015.  Last refill 07/28/2015.

## 2015-08-19 NOTE — Telephone Encounter (Signed)
Okay to refill? 

## 2015-08-28 ENCOUNTER — Encounter: Payer: Self-pay | Admitting: Family Medicine

## 2015-09-08 DIAGNOSIS — J449 Chronic obstructive pulmonary disease, unspecified: Secondary | ICD-10-CM | POA: Diagnosis not present

## 2015-09-19 ENCOUNTER — Encounter: Payer: Self-pay | Admitting: Family Medicine

## 2015-09-22 ENCOUNTER — Encounter: Payer: Self-pay | Admitting: Family Medicine

## 2015-09-22 MED ORDER — HYDROCODONE-ACETAMINOPHEN 10-325 MG PO TABS: 1.0000 | ORAL_TABLET | Freq: Three times a day (TID) | ORAL | Status: DC | PRN

## 2015-09-23 ENCOUNTER — Other Ambulatory Visit: Payer: Self-pay | Admitting: Family Medicine

## 2015-09-23 NOTE — Telephone Encounter (Signed)
Refill appropriate and filled per protocol. 

## 2015-09-29 ENCOUNTER — Other Ambulatory Visit: Payer: Self-pay | Admitting: Family Medicine

## 2015-09-29 NOTE — Telephone Encounter (Signed)
Okay to refill needs OV within next 2 months, I belieive his insurance is still active

## 2015-09-29 NOTE — Telephone Encounter (Signed)
Ok to refill??  Last office visit 05/05/2015.  Last refill 03/19/2015.  Last levels drawn 04/03/2015.

## 2015-09-30 NOTE — Telephone Encounter (Signed)
Prescription faxed.   Patient made aware of required F/U via MyChart.

## 2015-10-09 DIAGNOSIS — J449 Chronic obstructive pulmonary disease, unspecified: Secondary | ICD-10-CM | POA: Diagnosis not present

## 2015-10-12 ENCOUNTER — Encounter: Payer: Self-pay | Admitting: Family Medicine

## 2015-10-15 ENCOUNTER — Encounter: Payer: Self-pay | Admitting: *Deleted

## 2015-10-22 ENCOUNTER — Encounter: Payer: Self-pay | Admitting: Family Medicine

## 2015-10-23 DIAGNOSIS — J449 Chronic obstructive pulmonary disease, unspecified: Secondary | ICD-10-CM | POA: Diagnosis not present

## 2015-10-23 MED ORDER — HYDROCODONE-ACETAMINOPHEN 10-325 MG PO TABS: 1.0000 | ORAL_TABLET | Freq: Three times a day (TID) | ORAL | Status: DC | PRN

## 2015-10-25 ENCOUNTER — Other Ambulatory Visit: Payer: Self-pay | Admitting: Family Medicine

## 2015-10-26 ENCOUNTER — Other Ambulatory Visit: Payer: Self-pay | Admitting: Family Medicine

## 2015-10-26 ENCOUNTER — Encounter: Payer: Self-pay | Admitting: Family Medicine

## 2015-10-27 ENCOUNTER — Other Ambulatory Visit: Payer: Self-pay | Admitting: *Deleted

## 2015-10-27 MED ORDER — LEVOTHYROXINE SODIUM 50 MCG PO TABS: 50.0000 ug | ORAL_TABLET | Freq: Every day | ORAL | Status: DC

## 2015-10-27 NOTE — Telephone Encounter (Signed)
Refill appropriate and filled per protocol. 

## 2015-10-28 ENCOUNTER — Encounter: Payer: Self-pay | Admitting: Family Medicine

## 2015-11-04 ENCOUNTER — Other Ambulatory Visit: Payer: Medicare Other

## 2015-11-04 DIAGNOSIS — E039 Hypothyroidism, unspecified: Secondary | ICD-10-CM | POA: Diagnosis not present

## 2015-11-04 DIAGNOSIS — Z125 Encounter for screening for malignant neoplasm of prostate: Secondary | ICD-10-CM | POA: Diagnosis not present

## 2015-11-04 DIAGNOSIS — R7303 Prediabetes: Secondary | ICD-10-CM | POA: Diagnosis not present

## 2015-11-04 DIAGNOSIS — Z79899 Other long term (current) drug therapy: Secondary | ICD-10-CM

## 2015-11-04 DIAGNOSIS — Z0000 Encounter for general adult medical examination without abnormal findings: Secondary | ICD-10-CM

## 2015-11-04 DIAGNOSIS — I10 Essential (primary) hypertension: Secondary | ICD-10-CM

## 2015-11-04 DIAGNOSIS — R7989 Other specified abnormal findings of blood chemistry: Secondary | ICD-10-CM

## 2015-11-04 DIAGNOSIS — N183 Chronic kidney disease, stage 3 unspecified: Secondary | ICD-10-CM

## 2015-11-04 DIAGNOSIS — E291 Testicular hypofunction: Secondary | ICD-10-CM

## 2015-11-04 DIAGNOSIS — E785 Hyperlipidemia, unspecified: Secondary | ICD-10-CM | POA: Diagnosis not present

## 2015-11-04 LAB — LIPID PANEL
CHOL/HDL RATIO: 6.1 ratio — AB (ref <=5.0)
Cholesterol: 153 mg/dL (ref 125–200)
HDL: 25 mg/dL — AB (ref >=40)
LDL CALC: 88 mg/dL (ref <130)
TRIGLYCERIDES: 201 mg/dL — AB (ref <150)
VLDL: 40 mg/dL — ABNORMAL HIGH (ref <30)

## 2015-11-04 LAB — TSH: TSH: 3.276 u[IU]/mL (ref 0.350–4.500)

## 2015-11-05 LAB — CBC WITH DIFFERENTIAL/PLATELET
BASOS PCT: 0 % (ref 0–1)
Basophils Absolute: 0 10*3/uL (ref 0.0–0.1)
Eosinophils Absolute: 0.5 10*3/uL (ref 0.0–0.7)
Eosinophils Relative: 2 % (ref 0–5)
HCT: 43.7 % (ref 39.0–52.0)
Hemoglobin: 13.7 g/dL (ref 13.0–17.0)
LYMPHS PCT: 71 % — AB (ref 12–46)
Lymphs Abs: 17.8 10*3/uL — ABNORMAL HIGH (ref 0.7–4.0)
MCH: 25.4 pg — AB (ref 26.0–34.0)
MCHC: 31.4 g/dL (ref 30.0–36.0)
MCV: 81.1 fL (ref 78.0–100.0)
MONO ABS: 1.3 10*3/uL — AB (ref 0.1–1.0)
MPV: 10.2 fL (ref 8.6–12.4)
Monocytes Relative: 5 % (ref 3–12)
NEUTROS ABS: 5.5 10*3/uL (ref 1.7–7.7)
NEUTROS PCT: 22 % — AB (ref 43–77)
PLATELETS: 178 10*3/uL (ref 150–400)
RBC: 5.39 MIL/uL (ref 4.22–5.81)
RDW: 17.3 % — AB (ref 11.5–15.5)
WBC: 25 10*3/uL — AB (ref 4.0–10.5)

## 2015-11-05 LAB — TESTOSTERONE: TESTOSTERONE: 230 ng/dL — AB (ref 300–890)

## 2015-11-05 LAB — HEMOGLOBIN A1C
Hgb A1c MFr Bld: 6.4 % — ABNORMAL HIGH (ref <5.7)
Mean Plasma Glucose: 137 mg/dL — ABNORMAL HIGH (ref <117)

## 2015-11-05 LAB — PSA, MEDICARE: PSA: 1.55 ng/mL (ref <=4.00)

## 2015-11-05 LAB — PATHOLOGIST SMEAR REVIEW

## 2015-11-07 ENCOUNTER — Ambulatory Visit (INDEPENDENT_AMBULATORY_CARE_PROVIDER_SITE_OTHER): Payer: Medicare Other | Admitting: Family Medicine

## 2015-11-07 ENCOUNTER — Encounter: Payer: Self-pay | Admitting: Family Medicine

## 2015-11-07 VITALS — BP 130/82 | HR 70 | Temp 97.8°F | Resp 16 | Ht 66.0 in | Wt 264.0 lb

## 2015-11-07 DIAGNOSIS — D72829 Elevated white blood cell count, unspecified: Secondary | ICD-10-CM | POA: Diagnosis not present

## 2015-11-07 DIAGNOSIS — J449 Chronic obstructive pulmonary disease, unspecified: Secondary | ICD-10-CM

## 2015-11-07 DIAGNOSIS — Z0000 Encounter for general adult medical examination without abnormal findings: Secondary | ICD-10-CM

## 2015-11-07 DIAGNOSIS — E291 Testicular hypofunction: Secondary | ICD-10-CM

## 2015-11-07 DIAGNOSIS — R7989 Other specified abnormal findings of blood chemistry: Secondary | ICD-10-CM

## 2015-11-07 DIAGNOSIS — C9110 Chronic lymphocytic leukemia of B-cell type not having achieved remission: Secondary | ICD-10-CM

## 2015-11-07 DIAGNOSIS — M7071 Other bursitis of hip, right hip: Secondary | ICD-10-CM

## 2015-11-07 DIAGNOSIS — J45909 Unspecified asthma, uncomplicated: Secondary | ICD-10-CM

## 2015-11-07 DIAGNOSIS — D72820 Lymphocytosis (symptomatic): Secondary | ICD-10-CM | POA: Diagnosis not present

## 2015-11-07 DIAGNOSIS — L03113 Cellulitis of right upper limb: Secondary | ICD-10-CM

## 2015-11-07 DIAGNOSIS — G894 Chronic pain syndrome: Secondary | ICD-10-CM

## 2015-11-07 DIAGNOSIS — J4489 Other specified chronic obstructive pulmonary disease: Secondary | ICD-10-CM

## 2015-11-07 MED ORDER — DEXLANSOPRAZOLE 60 MG PO CPDR: 60.0000 mg | DELAYED_RELEASE_CAPSULE | Freq: Every day | ORAL | Status: DC

## 2015-11-07 MED ORDER — SULFAMETHOXAZOLE-TRIMETHOPRIM 800-160 MG PO TABS: 1.0000 | ORAL_TABLET | Freq: Two times a day (BID) | ORAL | Status: DC

## 2015-11-07 MED ORDER — METOPROLOL TARTRATE 50 MG PO TABS: 50.0000 mg | ORAL_TABLET | Freq: Two times a day (BID) | ORAL | Status: DC

## 2015-11-07 MED ORDER — HYDROCODONE-ACETAMINOPHEN 10-325 MG PO TABS: 1.0000 | ORAL_TABLET | Freq: Four times a day (QID) | ORAL | Status: DC | PRN

## 2015-11-07 NOTE — Patient Instructions (Addendum)
Flu shot given We will call with  Lab results  Continue current medications Muscle rubs F/U 4 months

## 2015-11-07 NOTE — Progress Notes (Signed)
Patient ID: [name] Ball, male   DOB: 1951-08-31, 64 y.o.   MRN: [id number] Subjective:   Patient presents for Medicare Annual/Subsequent preventive examination.   Reviewed fasting labs- WBC were significantly elevated at 25,000, smear showed atypical lymphocytes and absolute lymphocytosis, consistent with lymphoproliferative disorder.  No recent prednisone. He does have some scratches on arm that he has been picking at that are now red over the past week. No drainage, no fever.  Hip pain, feels a shooting sensation with mild burning/numbness on right side of hip/thigh, also gets it in his right arm randomly. Worse when sitting cross legged  On lays on that side. No injury no back pain.   20 minutes spent reviewing medications, to help with cost savings for new year.  TG were elevated A1C 6.4%  Testosterone was low at 230 Review Past Medical/Family/Social:   Risk Factors  Current exercise habits:  Dietary issues discussed:   Cardiac risk factors: Obesity (BMI >= 30 kg/m2).   Depression Screen  (Note: if answer to either of the following is "Yes", a more complete depression screening is indicated)  Over the past two weeks, have you felt down, depressed or hopeless? No Over the past two weeks, have you felt little interest or pleasure in doing things? No Have you lost interest or pleasure in daily life? No Do you often feel hopeless? No Do you cry easily over simple problems? No   Activities of Daily Living  In your present state of health, do you have any difficulty performing the following activities?:  Driving? No  Managing money? No  Feeding yourself? No  Getting from bed to chair? No  Climbing a flight of stairs? No  Preparing food and eating?: No  Bathing or showering? No  Getting dressed: No  Getting to the toilet? No  Using the toilet:No  Moving around from place to place: No  In the past year have you fallen or had a near fall?:No  Are you sexually active? No   Do you have more than one partner? No   Hearing Difficulties: Hearing impaired uses ASL Do you often ask people to speak up or repeat themselves? N/A Do you experience ringing or noises in your ears? No Do you have difficulty understanding soft or whispered voices? N/A Do you feel that you have a problem with memory? Sometimes Do you often misplace items? No  Do you feel safe at home? Yes  Cognitive Testing  Alert? Yes Normal Appearance?Yes  Oriented to person? Yes Place? Yes  Time? Yes  Recall of three objects? Yes  Can perform simple calculations? Yes  Displays appropriate judgment?Yes  Can read the correct time from a watch face?Yes   List the Names of Other Physician/Practitioners you currently use:     Screening Tests / Date Colonoscopy         UTD            Zostavax  - Due Influenza Vaccine - Due Tetanus/tdap- UTD  ROS: GEN- + fatigue, fever, weight loss,weakness, recent illness HEENT- denies eye drainage, change in vision, nasal discharge, CVS- denies chest pain, palpitations RESP- denies SOB, cough, wheeze ABD- denies N/V, change in stools, abd pain GU- denies dysuria, hematuria, dribbling, incontinence MSK- + joint pain, muscle aches, injury Neuro- denies headache, dizziness, syncope, seizure activity  PHYSICAL: GEN- NAD, alert and oriented x3 HEENT- PERRL, EOMI, non injected sclera, pink conjunctiva, MMM, oropharynx clear Neck- Supple, no LAD CVS- RRR, no murmur RESP-CTAB ABD-NABS, soft, NT,ND-  incision midline Skin- erythema around mulitple scabs on forearm, scaley raised quarter size lesion on upper arm, cracking skin bilat hands fingers MSK- Spine NT, fair ROM spina, pain with IR/ER of right hip, good ROM left hip, fair ROM bilat knees, no effusion, TTP along greater tronchanter right side EXT- No edema Pulses- Radial, DP- 2+     Assessment:    Annual wellness medicare exam   Plan:    During the course of the visit the patient was educated  and counseled about appropriate screening and preventive services including:  Shingles vaccine. Prescription given to that she can get the vaccine at the pharmacy or Medicare part D.          Leukocytosis with abnormal lymphocytes, labs to be rechecked today. Will hold on any vaccines.   He does have wounds on his arms that could also be contributing to his WBC but less likley         Mild Cellulitis Right arm- picking at wounds- cover with Bactrim     Bursitis- inflammation noted, unable to take NSAIDS, will try topical rub and massage therapy/home PT, declines injection by ortho  Diet review for nutrition referral? Yes ____ Not Indicated __x__  Patient Instructions (the written plan) was given to the patient.  Medicare Attestation  I have personally reviewed:  The patient's medical and social history  Their use of alcohol, tobacco or illicit drugs  Their current medications and supplements  The patient's functional ability including ADLs,fall risks, home safety risks, cognitive, and hearing and visual impairment  Diet and physical activities  Evidence for depression or mood disorders  The patient's weight, height, BMI, and visual acuity have been recorded in the chart. I have made referrals, counseling, and provided education to the patient based on review of the above and I have provided the patient with a written personalized care plan for preventive services.

## 2015-11-08 DIAGNOSIS — J449 Chronic obstructive pulmonary disease, unspecified: Secondary | ICD-10-CM | POA: Diagnosis not present

## 2015-11-08 LAB — CBC WITH DIFFERENTIAL/PLATELET
Basophils Absolute: 0 10*3/uL (ref 0.0–0.1)
Basophils Relative: 0 % (ref 0–1)
EOS ABS: 0.5 10*3/uL (ref 0.0–0.7)
EOS PCT: 2 % (ref 0–5)
HEMATOCRIT: 43.8 % (ref 39.0–52.0)
Hemoglobin: 14 g/dL (ref 13.0–17.0)
LYMPHS ABS: 19.6 10*3/uL — AB (ref 0.7–4.0)
LYMPHS PCT: 72 % — AB (ref 12–46)
MCH: 25.6 pg — ABNORMAL LOW (ref 26.0–34.0)
MCHC: 32 g/dL (ref 30.0–36.0)
MCV: 80.2 fL (ref 78.0–100.0)
MONO ABS: 1.4 10*3/uL — AB (ref 0.1–1.0)
MPV: 9.9 fL (ref 8.6–12.4)
Monocytes Relative: 5 % (ref 3–12)
Neutro Abs: 5.7 10*3/uL (ref 1.7–7.7)
Neutrophils Relative %: 21 % — ABNORMAL LOW (ref 43–77)
PLATELETS: 204 10*3/uL (ref 150–400)
RBC: 5.46 MIL/uL (ref 4.22–5.81)
RDW: 17.4 % — AB (ref 11.5–15.5)
WBC: 27.2 10*3/uL — AB (ref 4.0–10.5)

## 2015-11-08 LAB — COMPREHENSIVE METABOLIC PANEL
ALK PHOS: 40 U/L (ref 40–115)
ALT: 11 U/L (ref 9–46)
AST: 13 U/L (ref 10–35)
Albumin: 4.5 g/dL (ref 3.6–5.1)
BUN: 19 mg/dL (ref 7–25)
CALCIUM: 9.2 mg/dL (ref 8.6–10.3)
CO2: 20 mmol/L (ref 20–31)
Chloride: 104 mmol/L (ref 98–110)
Creat: 1.58 mg/dL — ABNORMAL HIGH (ref 0.70–1.25)
Glucose, Bld: 82 mg/dL (ref 70–99)
POTASSIUM: 5.2 mmol/L (ref 3.5–5.3)
Sodium: 138 mmol/L (ref 135–146)
TOTAL PROTEIN: 6.4 g/dL (ref 6.1–8.1)
Total Bilirubin: 0.6 mg/dL (ref 0.2–1.2)

## 2015-11-09 ENCOUNTER — Encounter: Payer: Self-pay | Admitting: Family Medicine

## 2015-11-09 MED ORDER — AMLODIPINE BESYLATE 5 MG PO TABS: 5.0000 mg | ORAL_TABLET | Freq: Every day | ORAL | Status: DC

## 2015-11-09 MED ORDER — CITALOPRAM HYDROBROMIDE 10 MG PO TABS: 10.0000 mg | ORAL_TABLET | Freq: Every day | ORAL | Status: DC

## 2015-11-09 MED ORDER — ALLOPURINOL 100 MG PO TABS: 100.0000 mg | ORAL_TABLET | Freq: Every day | ORAL | Status: DC

## 2015-11-09 NOTE — Assessment & Plan Note (Addendum)
Pt had not taken injection, no change to dose PSA normal

## 2015-11-09 NOTE — Assessment & Plan Note (Signed)
To assist with cost, changed to 120 tabs , will last at least 6 weeks

## 2015-11-09 NOTE — Assessment & Plan Note (Signed)
Continue Asmanex, he does not take regulary, but more bronchitis during winter months, advised at least daily use

## 2015-11-10 DIAGNOSIS — J449 Chronic obstructive pulmonary disease, unspecified: Secondary | ICD-10-CM | POA: Diagnosis not present

## 2015-11-10 DIAGNOSIS — C9110 Chronic lymphocytic leukemia of B-cell type not having achieved remission: Secondary | ICD-10-CM | POA: Insufficient documentation

## 2015-11-10 LAB — IMMUNOPHENOTYPING BY FLOW CYTOMETRY: Viability: 74 %

## 2015-11-10 LAB — REFLEX BLAST SCREEN

## 2015-11-10 NOTE — Assessment & Plan Note (Signed)
Repeat white blood cell count as well as immunophenotyping concerning for adult CLL versus SLL I'm going to refer him to oncology for further workup.

## 2015-11-10 NOTE — Addendum Note (Signed)
Addended by: Vic Blackbird F on: 11/10/2015 04:22 PM   Modules accepted: Orders

## 2015-11-11 ENCOUNTER — Encounter: Payer: Self-pay | Admitting: Family Medicine

## 2015-11-17 ENCOUNTER — Ambulatory Visit (HOSPITAL_COMMUNITY): Payer: Self-pay | Admitting: Hematology & Oncology

## 2015-11-18 ENCOUNTER — Encounter: Payer: Self-pay | Admitting: Family Medicine

## 2015-11-20 ENCOUNTER — Encounter: Payer: Self-pay | Admitting: Family Medicine

## 2015-11-21 ENCOUNTER — Encounter (HOSPITAL_COMMUNITY): Payer: Self-pay | Admitting: Hematology & Oncology

## 2015-11-21 ENCOUNTER — Encounter (HOSPITAL_COMMUNITY): Payer: Medicare Other | Attending: Hematology & Oncology | Admitting: Hematology & Oncology

## 2015-11-21 VITALS — BP 179/77 | HR 69 | Temp 97.5°F | Resp 22 | Ht 66.5 in | Wt 264.8 lb

## 2015-11-21 DIAGNOSIS — Z0000 Encounter for general adult medical examination without abnormal findings: Secondary | ICD-10-CM

## 2015-11-21 DIAGNOSIS — N183 Chronic kidney disease, stage 3 (moderate): Secondary | ICD-10-CM

## 2015-11-21 DIAGNOSIS — C9110 Chronic lymphocytic leukemia of B-cell type not having achieved remission: Secondary | ICD-10-CM | POA: Diagnosis not present

## 2015-11-21 DIAGNOSIS — Z23 Encounter for immunization: Secondary | ICD-10-CM

## 2015-11-21 DIAGNOSIS — Z85528 Personal history of other malignant neoplasm of kidney: Secondary | ICD-10-CM | POA: Diagnosis not present

## 2015-11-21 LAB — CBC WITH DIFFERENTIAL/PLATELET
BASOS ABS: 0 10*3/uL (ref 0.0–0.1)
Basophils Relative: 0 %
EOS PCT: 2 %
Eosinophils Absolute: 0.6 10*3/uL (ref 0.0–0.7)
HEMATOCRIT: 43.7 % (ref 39.0–52.0)
Hemoglobin: 13.9 g/dL (ref 13.0–17.0)
LYMPHS PCT: 76 %
Lymphs Abs: 20.4 10*3/uL — ABNORMAL HIGH (ref 0.7–4.0)
MCH: 26.5 pg (ref 26.0–34.0)
MCHC: 31.8 g/dL (ref 30.0–36.0)
MCV: 83.4 fL (ref 78.0–100.0)
Monocytes Absolute: 0.7 10*3/uL (ref 0.1–1.0)
Monocytes Relative: 3 %
Neutro Abs: 5 10*3/uL (ref 1.7–7.7)
Neutrophils Relative %: 19 %
PLATELETS: 177 10*3/uL (ref 150–400)
RBC: 5.24 MIL/uL (ref 4.22–5.81)
RDW: 16.6 % — AB (ref 11.5–15.5)
WBC: 26.7 10*3/uL — AB (ref 4.0–10.5)

## 2015-11-21 LAB — LACTATE DEHYDROGENASE: LDH: 105 U/L (ref 98–192)

## 2015-11-21 MED ORDER — INFLUENZA VAC SPLIT QUAD 0.5 ML IM SUSY
0.5000 mL | PREFILLED_SYRINGE | Freq: Once | INTRAMUSCULAR | Status: AC
  Administered 2015-11-21: 0.5 mL via INTRAMUSCULAR

## 2015-11-21 MED ORDER — INFLUENZA VAC SPLIT QUAD 0.5 ML IM SUSY
0.5000 mL | PREFILLED_SYRINGE | Freq: Once | INTRAMUSCULAR | Status: DC
  Filled 2015-11-21: qty 0.5

## 2015-11-21 MED ORDER — SULFAMETHOXAZOLE-TRIMETHOPRIM 800-160 MG PO TABS: 1.0000 | ORAL_TABLET | Freq: Two times a day (BID) | ORAL | Status: DC

## 2015-11-21 NOTE — Progress Notes (Signed)
[name] Ball's reason for visit today is for labs as scheduled per MD orders.  Venipuncture performed with a 23 gauge butterfly needle to L Antecubital.  [name] Ball tolerated procedure well and without incident; questions were answered and patient was discharged.    [name] Ball Had a flu vaccine today

## 2015-11-21 NOTE — Patient Instructions (Signed)
[place] at [facility] Discharge Instructions  RECOMMENDATIONS MADE BY THE CONSULTANT AND ANY TEST RESULTS WILL BE SENT TO YOUR REFERRING PHYSICIAN.   Exam completed by Dr Whitney Muse today You have CLL, there is some information at the end of the discharge papers here We have given you some books on it too To start we only need to do blood work. Blood work today Check antibody levels today, if you start to have recurrent infections we can start giving you IVIG   Return to see the doctor in 2 months We will release lab notes to my chart  When we treat CLL:  Weight loss, big lypmh nodes, drops in blood counts (hemoglobin and platelets or even WBCs), when white blood counts doubles in just a short period of time Please call the clinic if you have any questions or concerns    Thank you for choosing [place] at [facility] to provide your oncology and hematology care.  To afford each patient quality time with our provider, please arrive at least 15 minutes before your scheduled appointment time.    You need to re-schedule your appointment should you arrive 10 or more minutes late.  We strive to give you quality time with our providers, and arriving late affects you and other patients whose appointments are after yours.  Also, if you no show three or more times for appointments you may be dismissed from the clinic at the providers discretion.     Again, thank you for choosing [facility].  Our hope is that these requests will decrease the amount of time that you wait before being seen by our physicians.       _____________________________________________________________  Should you have questions after your visit to [facility], please contact our office at [phone] between the hours of 8:30 a.m. and 4:30 p.m.  Voicemails left after 4:30 p.m. will not be returned until the following business day.  For prescription  refill requests, have your pharmacy contact our office.      Chronic Lymphocytic Leukemia Chronic lymphocytic leukemia (CLL) is a type of cancer of the bone marrow and blood cells. Bone marrow is the soft, spongy tissue inside your bone. In CLL, the bone marrow makes too many white blood cells that usually fight infection in the body (lymphocytes). CLL usually gets worse slowly and is the most common type of adult leukemia.  RISK FACTORS No one knows the exact cause of CLL. There is a higher risk of CLL in people who:   Are older than 50 years.  Are white.  Are male.  Have a family history of CLL or other cancers of the lymph system.  Are of [place] or [place] descent.  Have been exposed to certain chemicals, such as Agent Orange (used in the [place] War) or other herbicides or insecticides. SYMPTOMS  At first, there may be no symptoms of chronic lymphocytic leukemia. After a while, some symptoms may occur, such as:   Feeling more tired than usual, even after rest.  Unplanned weight loss.  Heavy sweating at night.  Fevers.  Shortness of breath.  Decreased energy.  Paleness.  Painless, swollen lymph nodes.  A feeling of fullness in the upper left part of the abdomen.  Easy bruising or bleeding.  More frequent infections. DIAGNOSIS  Your health care provider may perform the following exams and tests to diagnose CLL:  Physical exam to  check for an enlarged spleen, liver, or lymph nodes.  Blood and bone marrow tests to identify the presence of cancer cells. These may include tests such as complete blood count, flow cytometry, immunophenotyping, and fluorescence in situ hybridization (FISH).  CT scan to look for swelling or abnormalities in your spleen, liver, and lymph nodes. TREATMENT  Treatment options for CLL depend on the stage and the presence of symptoms. There are a number of types of treatment used for this condition,  including:  Observation.  Targeted drugs. These are drugs that interfere with chemicals that leukemia cells need in order to grow and multiply. They identify and attack specific cancer cells without harming normal cells.  Chemotherapy drugs. These medicines kill cells that are multiplying quickly, such as leukemia cells.  Radiation.  Surgery to remove the spleen.  Biological therapy. This treatment boosts the ability of your own immune system to fight the leukemia cells.  Bone marrow or peripheral blood stem cell transplant. This treatment allows the patient to receive very high doses of chemotherapy or radiation or both. These high doses kill the cancer cells but also destroy the bone marrow. After treatment is complete, you are given donor bone marrow or stem cells, which will replace the bone marrow. HOME CARE INSTRUCTIONS   Because you have an increased risk of infection, practice good hand washing and avoid being around people who are ill or being in crowded places.  Because you have an increased risk of bleeding and bruising, avoid contact sports or other rough activities.  Take medicines only directed by your health care provider.  Although some of your treatments might affect your appetite, try to eat regular, healthy meals.  If you develop any side effects, such as nausea, diarrhea, rash, white patches in your mouth, a sore throat, difficulty swallowing, or severe fatigue, tell your health care provider. He or she may have recommendations of things you can do to improve symptoms.  Consider learning some ways to cope with the stress of having a chronic illness, such as yoga, meditation, or participating in a support group. SEEK MEDICAL CARE IF:  You develop chest pains.  You notice pain, swelling or redness anywhere in your legs.  You have pain in your belly (abdomen).  You develop new bruises that are getting bigger.  You have painful or more swollen lymph nodes.  You  develop bleeding from your gums, nose, or in your urine or stools.  You are unable to stop throwing up (vomiting).  You cannot keep liquids down.  You feel light-headed.  You have a fever.  You develop a severe stiff neck or headache. SEEK IMMEDIATE MEDICAL CARE IF:  You have trouble breathing or feel short of breath.  You faint.   This information is not intended to replace advice given to you by your health care provider. Make sure you discuss any questions you have with your health care provider.   Document Released: 04/10/2009 Document Revised: 12/13/2014 Document Reviewed: 05/17/2013 Elsevier Interactive Patient Education [employer].

## 2015-11-22 LAB — IGG, IGA, IGM
IGA: 64 mg/dL (ref 61–437)
IgG (Immunoglobin G), Serum: 524 mg/dL — ABNORMAL LOW (ref 700–1600)
IgM, Serum: <5 mg/dL — ABNORMAL LOW (ref 20–172)

## 2015-11-22 LAB — SAVE SMEAR

## 2015-11-23 ENCOUNTER — Encounter: Payer: Self-pay | Admitting: Family Medicine

## 2015-11-24 LAB — PROTEIN ELECTROPHORESIS, SERUM
A/G Ratio: 1.5 (ref 0.7–1.7)
ALBUMIN ELP: 3.7 g/dL (ref 2.9–4.4)
ALPHA-1-GLOBULIN: 0.2 g/dL (ref 0.0–0.4)
ALPHA-2-GLOBULIN: 0.7 g/dL (ref 0.4–1.0)
Beta Globulin: 1.1 g/dL (ref 0.7–1.3)
GAMMA GLOBULIN: 0.5 g/dL (ref 0.4–1.8)
Globulin, Total: 2.4 g/dL (ref 2.2–3.9)
TOTAL PROTEIN ELP: 6.1 g/dL (ref 6.0–8.5)

## 2015-11-24 LAB — IMMUNOFIXATION ELECTROPHORESIS
IGA: 60 mg/dL — AB (ref 61–437)
IGG (IMMUNOGLOBIN G), SERUM: 517 mg/dL — AB (ref 700–1600)
IgM, Serum: 5 mg/dL — ABNORMAL LOW (ref 20–172)
TOTAL PROTEIN ELP: 6.2 g/dL (ref 6.0–8.5)

## 2015-11-25 ENCOUNTER — Other Ambulatory Visit: Payer: Self-pay | Admitting: Family Medicine

## 2015-11-25 NOTE — Telephone Encounter (Signed)
Ok to refill Testosterone??  Last office visit 11/21/2015.  Last refill 09/30/2015.

## 2015-11-25 NOTE — Telephone Encounter (Signed)
okay

## 2015-11-25 NOTE — Telephone Encounter (Signed)
Prescription faxed

## 2015-11-26 ENCOUNTER — Ambulatory Visit (INDEPENDENT_AMBULATORY_CARE_PROVIDER_SITE_OTHER): Payer: Medicare Other | Admitting: *Deleted

## 2015-11-26 DIAGNOSIS — Z23 Encounter for immunization: Secondary | ICD-10-CM

## 2015-12-09 DIAGNOSIS — J449 Chronic obstructive pulmonary disease, unspecified: Secondary | ICD-10-CM | POA: Diagnosis not present

## 2015-12-10 ENCOUNTER — Telehealth: Payer: Self-pay | Admitting: *Deleted

## 2015-12-10 MED ORDER — CLINDAMYCIN HCL 300 MG PO CAPS: 300.0000 mg | ORAL_CAPSULE | Freq: Three times a day (TID) | ORAL | Status: DC

## 2015-12-10 NOTE — Telephone Encounter (Signed)
Clindamycin 300mg  TID  Salt water gargle

## 2015-12-10 NOTE — Telephone Encounter (Signed)
Pt wife called stating that she believes pt has an abscess on his jaw (lower jaw) where his dentures sit, and would like to know if you could call him in an antibiotic until his appt on Friday, she feels like he needs before appt d/t it being 48 hrs, says it is swollen but not bad, does not want it to get worse.Is leaving to go OOT on Tuesday. Please advise!  [facility] back [phone]

## 2015-12-10 NOTE — Telephone Encounter (Signed)
Call placed to patient and patient wife made aware.

## 2015-12-12 ENCOUNTER — Encounter: Payer: Self-pay | Admitting: Family Medicine

## 2015-12-12 ENCOUNTER — Ambulatory Visit (INDEPENDENT_AMBULATORY_CARE_PROVIDER_SITE_OTHER): Payer: 59 | Admitting: Family Medicine

## 2015-12-12 VITALS — BP 126/74 | HR 78 | Temp 97.5°F | Resp 14 | Ht 66.5 in | Wt 269.0 lb

## 2015-12-12 DIAGNOSIS — M272 Inflammatory conditions of jaws: Secondary | ICD-10-CM

## 2015-12-12 DIAGNOSIS — C9110 Chronic lymphocytic leukemia of B-cell type not having achieved remission: Secondary | ICD-10-CM

## 2015-12-12 MED ORDER — HYDROCODONE-ACETAMINOPHEN 10-325 MG PO TABS: 1.0000 | ORAL_TABLET | Freq: Four times a day (QID) | ORAL | Status: DC | PRN

## 2015-12-12 NOTE — Progress Notes (Signed)
Patient ID: [name] Ball, male   DOB: 04-25-1951, 65 y.o.   MRN: [id number]   Subjective:    Patient ID: [name] Ball, male    DOB: 1950-12-15, 65 y.o.   MRN: [id number]  Patient presents for R Jaw Edema   Pt here with right jaw swelling that started 2 days ago. He actually had a jagged age on his lower dentures that was cutting into his gum line he started to become fatigued and did not feel well. The next day he noticed a swelling on his lower jaw line. He did not have any fever no chills. He does have newly diagnosed CLL. I started him on clindamycin the same-day the swelling started and he is here for check. The swelling has stabilized and is actually gone down some. He has not had any drainage from the gumline.  He does state that he at one night this week where his breathing was bad and he took 2 neb treatments but has been fine since then.  Review Of Systems:  GEN- denies fatigue, fever, weight loss,weakness, recent illness HEENT- denies eye drainage, change in vision, nasal discharge, CVS- denies chest pain, palpitations RESP- denies SOB, cough, wheeze ABD- denies N/V, change in stools, abd pain GU- denies dysuria, hematuria, dribbling, incontinence MSK- +joint pain, muscle aches, injury Neuro- denies headache, dizziness, syncope, seizure activity       Objective:    BP 126/74 mmHg  Pulse 78  Temp(Src) 97.5 F (36.4 C) (Oral)  Resp 14  Ht 5' 6.5" (1.689 m)  Wt 269 lb (122.018 kg)  BMI 42.77 kg/m2 GEN- NAD, alert and oriented x3 HEENT- PERRL, EOMI, non injected sclera, pink conjunctiva, MMM, post oropharynx clear, swelling lower right gumline +erythema, no bogginess, no pus expressed Neck- Supple, dime size swelling right jawline TTP CVS- RRR, no murmur RESP-CTAB EXT- No edema        Assessment & Plan:      Problem List Items Addressed This Visit    Chronic lymphocytic leukemia (CLL), B-cell (HCC)   Relevant Medications   HYDROcodone-acetaminophen  (NORCO) 10-325 MG tablet    Other Visit Diagnoses    Abscess, jaw    -  Primary    Improved swelling, seems dentures was initial culprit for infection, complete Clindamycin, pain meds refilled, discussed red flags in setting of CLL       Note: This dictation was prepared with Dragon dictation along with smaller phrase technology. Any transcriptional errors that result from this process are unintentional.

## 2015-12-12 NOTE — Patient Instructions (Signed)
Continue current medications Complete antibiotics F/U as previous

## 2015-12-24 ENCOUNTER — Encounter: Payer: Self-pay | Admitting: Family Medicine

## 2015-12-24 DIAGNOSIS — J029 Acute pharyngitis, unspecified: Secondary | ICD-10-CM | POA: Diagnosis not present

## 2015-12-24 NOTE — Telephone Encounter (Signed)
Call placed to patient wife.   Advised with patient current health issues, he needs to be seen immediately.   Advised to take him to ER or UC with mask on ASAP.   Verbalized understanding.

## 2015-12-31 ENCOUNTER — Encounter: Payer: Self-pay | Admitting: Family Medicine

## 2015-12-31 MED ORDER — CLINDAMYCIN HCL 300 MG PO CAPS: 300.0000 mg | ORAL_CAPSULE | Freq: Four times a day (QID) | ORAL | Status: DC

## 2016-01-02 ENCOUNTER — Encounter: Payer: Self-pay | Admitting: Family Medicine

## 2016-01-02 ENCOUNTER — Ambulatory Visit (INDEPENDENT_AMBULATORY_CARE_PROVIDER_SITE_OTHER): Payer: 59 | Admitting: Family Medicine

## 2016-01-02 ENCOUNTER — Ambulatory Visit (HOSPITAL_COMMUNITY)
Admission: RE | Admit: 2016-01-02 | Discharge: 2016-01-02 | Disposition: A | Discharge status: Home / Self Care | Payer: Medicare Other | Source: Ambulatory Visit | Attending: Family Medicine | Admitting: Family Medicine

## 2016-01-02 VITALS — BP 140/82 | HR 88 | Temp 98.2°F | Resp 16 | Ht 66.5 in | Wt 262.0 lb

## 2016-01-02 DIAGNOSIS — R221 Localized swelling, mass and lump, neck: Secondary | ICD-10-CM | POA: Diagnosis not present

## 2016-01-02 DIAGNOSIS — N189 Chronic kidney disease, unspecified: Secondary | ICD-10-CM | POA: Insufficient documentation

## 2016-01-02 DIAGNOSIS — Z905 Acquired absence of kidney: Secondary | ICD-10-CM | POA: Insufficient documentation

## 2016-01-02 DIAGNOSIS — M47812 Spondylosis without myelopathy or radiculopathy, cervical region: Secondary | ICD-10-CM | POA: Diagnosis not present

## 2016-01-02 DIAGNOSIS — I7090 Unspecified atherosclerosis: Secondary | ICD-10-CM | POA: Diagnosis not present

## 2016-01-02 DIAGNOSIS — R591 Generalized enlarged lymph nodes: Secondary | ICD-10-CM | POA: Diagnosis not present

## 2016-01-02 DIAGNOSIS — C9110 Chronic lymphocytic leukemia of B-cell type not having achieved remission: Secondary | ICD-10-CM | POA: Diagnosis not present

## 2016-01-02 DIAGNOSIS — R590 Localized enlarged lymph nodes: Secondary | ICD-10-CM | POA: Insufficient documentation

## 2016-01-02 NOTE — Patient Instructions (Signed)
CT scan to be done Complete antibiotics F/U pending results

## 2016-01-02 NOTE — Progress Notes (Signed)
Patient ID: [name] Ball, male   DOB: Apr 30, 1951, 65 y.o.   MRN: [id number]   Subjective:    Patient ID: [name] Ball, male    DOB: [birth year], 65 y.o.   MRN: [id number]  Patient presents for R Sided Jaw Abscess  Patient here for follow-up. He was seen on January 6 at that time had abscess formation in his right jaw area. I put him on clindamycin 3 times a day for 10 days, after he completed he began having sore throat and pain in right side of neck at previous siteof lymph node, seen by UC on 1/18 given 1 more week of clindamycin, rocephin injection and prednisone. His wife called in about 3 days ago states that he still had some mild swelling the size of a small knot everything else has resolved. I restarted clindamycin for another 4 days. He is here today for recheck. He does have underlying CLL leukemia. He feels okay but knot is still present and tender   Review Of Systems:  GEN- + fatigue, fever, weight loss,weakness, recent illness HEENT- denies eye drainage, change in vision, nasal discharge, CVS- denies chest pain, palpitations RESP- denies SOB, cough, wheeze Neuro- denies headache, dizziness, syncope, seizure activity       Objective:    BP 140/82 mmHg  Pulse 88  Temp(Src) 98.2 F (36.8 C) (Oral)  Resp 16  Ht 5' 6.5" (1.689 m)  Wt 262 lb (118.842 kg)  BMI 41.66 kg/m2 GEN- NAD, alert and oriented x3 HEENT- PERRL, EOMI, non injected sclera, pink conjunctiva, MMM, oropharynx clear, no abscess  Neck- Supple, + 1.5cm node TTP palpated right anterior neck  CVS- RRR, no murmur RESP-CTAB Pulses- Radial 2+        Assessment & Plan:      Problem List Items Addressed This Visit    None    Visit Diagnoses    Lymphadenopathy    -  Primary    with underlying CLL and persist node that is tender obtain CT neck without contrast due to nephrectomy and CKD    Relevant Orders    CT Soft Tissue Neck Wo Contrast    CLL (chronic lymphocytic leukemia) (HCC)        Relevant  Orders    CT Soft Tissue Neck Wo Contrast       Note: This dictation was prepared with Dragon dictation along with smaller phrase technology. Any transcriptional errors that result from this process are unintentional.

## 2016-01-03 NOTE — Progress Notes (Signed)
[facility] NOTE  Patient Care Team: Alycia Rossetti, MD as PCP - General (Family Medicine) Fay Records, MD as Consulting Physician (Cardiology)  CHIEF COMPLAINTS/PURPOSE OF CONSULTATION:  CLL Flow cytometry performed on 11/07/15 by Dr. Buelah Manis shows findings consistent with CLL/SLL CBC from 11/04/15 shows a total WBC of 25,000. Normal hemoglobin, HCT, PLT Stage III chronic kidney disease  HISTORY OF PRESENTING ILLNESS:  [name] Ball 65 y.o. male is here because of his diagnosis of CLL.  [name] Ball is mostly deaf, and a big jokester. He is here today with his wife and daughter, who both sign. They are signing during this appointment to help [name] Ball understand everything. His wife states that he can hear a little bit and he can read lips. Communication is altogether relatively easy as a result of the combination of this and sign language.   [name] Ball and his wife live with their daughter, who is present today, and her husband.   His wife explains the onset of his deafness. She says that he had a colon abscess in 2007. At that time, she remarks that he had a fever of 105, for 5 days in the hospital. They had him on high dose IV antibiotics. His wife states that, about 8 months after that, [name] Ball complained "you know how when you have a cold and you get stuffed up? That's happened to me." She adds that he's always had a bad ear, but after this, his bad ear went totally deaf, and his good ear went bad. They've been doing sign language for 5 years.  His left kidney was removed by a doctor in [place], [place]. [name] Ball and his wife lived in [place] for 31 years. He underwent a nephrectomy., Mrs. Tetteh states that "75% of the kidney was covered in cancer," but there was no metastasis. They took the kidney out, but [name] Ball did not receive chemotherapy or radiation. The official diagnosis was renal cell carcinoma.  After his kidney was  removed, they followed up with [name] Ball twice with scans, to assess spread. It had not. After that, Mrs. Barsamian states that they've mainly been watching his remaining kidney function, every 6 months or so.  He sleeps well. He has a BiPAP that he uses every night and every time he sleeps, even when napping. His wife says that they watch his lungs very closely and that he's been doing very well.  [name] Ball denies any drenching night sweats. He says the only thing new that's been happening the last 3-4 weeks is the fact that he sometimes gets numb on the right side of his face. The numbness spreads down his arm and down his leg, which he describes as feeling like a novacaine shot. He says this happens mostly in the afternoons or early evenings. At first, each spell lasted 3-4 minutes; now he states that they last up to 10. He says that these symptoms do go away, and that it happens about 2-3 times a week, not every day. His wife comments that [name] Ball tends to sit in a "weird way," which may contribute to his numbness. His wife says she thinks it's a nerve. [name] Ball jokes "I think it's a lack of alcohol, I'm pretty sure."  Dr. Buelah Manis prescribes [name] Ball testosterone.  He used to get migraines, but [name] Ball reports that his migraines have gotten considerably less bad ever since he's moved down to [facility] from [place].  He says that, in the past 2 years, he's had 1 or 2. He says "but also I have early warning system," and that when his brain starst to hurt, he takes OTC migraine tablets and can usually stop the onset of the full migraine.  In terms of his appetite, he says things don't taste the same; "a little off." But he still eats. He has no complaints other than this. Any weight loss has been intentional, and his weight has remained unchanged since about a year ago. He's been eating smaller portions, less soda, etc. in an effort to be healthier.  He denies lumps or bumps. He reported an  axillary mass that resolved. Denies any B symptoms. Is here today for ongoing discussion and ongoing recommendations in regards to his CLL.  MEDICAL HISTORY:  Past Medical History  Diagnosis Date  . Allergy   . COPD (chronic obstructive pulmonary disease) ([place])   . Hypertension   . Thyroid disease     hypothryoid  . Migraine   . Ulcer   . Diverticulitis   . Solar purpura ([place])   . Asthma   . Low testosterone   . GERD (gastroesophageal reflux disease)   . Arthritis   . Ventral hernia FEB 2015 CT     Large infra umbilical ventral hernia containing loops of small  . Diverticulosis     colonoscopy 3/15  . Cancer ([place])     kidney  . CLL (chronic lymphocytic leukemia) ([place]) 2016    SURGICAL HISTORY: Past Surgical History  Procedure Laterality Date  . Kidney cyst removal  jan 2005    renal cell carcinoma  . Abdominal mesh inserted  june 2006, may 2008,dec 2010  . Knee minescus repair Left feb 2007  . Cholecystectomy  june 2007  . Colon surgery  sept 2007  . Colonoscopy    . Hernia repair  05/2005 5/20108, 11/2009    with mesh  . Colonoscopy N/A 02/20/2014    Procedure: COLONOSCOPY;  Surgeon: Danie Binder, MD;  Location: [facility];  Service: Endoscopy;  Laterality: N/A;  . Nephrectomy      SOCIAL HISTORY: Social History   Social History  . Marital Status: Married    Spouse Name: N/A  . Number of Children: N/A  . Years of Education: N/A   Occupational History  . Not on file.   Social History Main Topics  . Smoking status: Former Smoker    Quit date: 12/06/1996  . Smokeless tobacco: Never Used  . Alcohol Use: Yes     Comment: a few shots a few times a week  . Drug Use: No  . Sexual Activity: Not on file   Other Topics Concern  . 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   He says he's been married 72 years, almost 34. He has 4 children and 6 grandchildren. No great-grandchildren yet.  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 He also cooks. He also worked in Public relations account executive.  He used to smoke. He quit about 17 years ago. He says he has a shot of whisky one or two nights a week, so he doesn't have problems with alcohol. He may, however, have a problem with soda -- [employer].  In terms of  his hobbies, he jokes that he "likes to do nothing." He cooks. He says he's the "second best cook on this side of the [place], but I'm not bragging." [place] food is his specialty, he says. He says he makes an excellent leg of lamb, and a great Moussaka. His wife makes a good Baklava. He also has a good [place] chicken recipe.  Why [place] food? When he was younger, he cooked and worked in [place] with a family that was all [place].  They had a [place] festival coming up. So grandma came in and they cooked for the [employer] -- maybe 2500 people.  She showed him each recipe and she was really tough. He says it really stuck with him ever since. He says when he gets his recipes, he gets them from families; family recipes. He says he's got about 3,000 of them. He says he doesn't give his recipes to anybody, but he'll make them. He says "I make the best pizza in the [place]." He says he makes a good soup, too. It's clear that he loves to cook very much.  FAMILY HISTORY: Family History  Problem Relation Age of Onset  . Cancer Mother     lung  . Colon cancer Neg Hx    indicated that his mother is deceased. He indicated that his father is deceased.    His biological father died of a stroke in bed; his mother died of brain cancer, metastasized breast cancer. They both died at 16. He says jokingly, "That's making me a little apprehensive, but that's okay." His mother died 9 years ago. He has a half brother and a half sister. His sister had juvenile diabetes; his  brother's fine. No family history of CLL on his side that he knows of. They are familiar with CLL in general thanks to his wife's mother having it.  ALLERGIES:  is allergic to contrast media; other; strawberry extract; and ether.  MEDICATIONS:  Current Outpatient Prescriptions  Medication Sig Dispense Refill  . acetaminophen (TYLENOL) 325 MG tablet Take 650 mg by mouth every 6 (six) hours as needed.    Marland Kitchen albuterol (PROVENTIL) (2.5 MG/3ML) 0.083% nebulizer solution TAKE 3MLS(1 VIAL) VIA NEBULIZER EVERY 6 HOURS AS NEEDED FOR WHEEZING OR SHORTNESS OF BREATH 150 mL 0  . allopurinol (ZYLOPRIM) 100 MG tablet Take 1 tablet (100 mg total) by mouth at bedtime. 90 tablet 1  . amLODipine (NORVASC) 5 MG tablet Take 1 tablet (5 mg total) by mouth daily. 90 tablet 3  . ATROVENT HFA 17 MCG/ACT inhaler INHALE 2 PUFFS BY MOUTH INTO THE LUNGS EVERY 6 HOURS 12.9 g 11  . calcium carbonate (TUMS - DOSED IN MG ELEMENTAL CALCIUM) 500 MG chewable tablet Chew 2-3 tablets by mouth once as needed for indigestion or heartburn. Reported on 11/21/2015    . cholecalciferol (VITAMIN D) 1000 UNITS tablet Take 1,000 Units by mouth every morning.     . citalopram (CELEXA) 10 MG tablet Take 1 tablet (10 mg total) by mouth daily. 90 tablet 2  . clindamycin (CLEOCIN) 300 MG capsule Take 1 capsule (300 mg total) by mouth 4 (four) times daily. 16 capsule 0  . dexlansoprazole (DEXILANT) 60 MG capsule Take 1 capsule (60 mg total) by mouth daily. 180 capsule 1  . diphenhydrAMINE (BENADRYL) 25 MG tablet Take 25 mg by mouth every evening.     . eletriptan (RELPAX) 20 MG tablet Take 1 tablet (20 mg total) by mouth as needed for migraine. One  tablet by mouth at onset of headache. May repeat in 2 hours if headache persists or recurs. 10 tablet 3  . HYDROcodone-acetaminophen (NORCO) 10-325 MG tablet Take 1 tablet by mouth every 6 (six) hours as needed. 120 tablet 0  . levalbuterol (XOPENEX HFA) 45 MCG/ACT inhaler Inhale 1-2 puffs into the  lungs every 4 (four) hours as needed for wheezing. 1 Inhaler 6  . levothyroxine (SYNTHROID, LEVOTHROID) 50 MCG tablet Take 1 tablet (50 mcg total) by mouth daily before breakfast. 90 tablet 3  . loratadine (CLARITIN) 10 MG tablet Take 10 mg by mouth every morning.     . metoprolol (LOPRESSOR) 50 MG tablet Take 1 tablet (50 mg total) by mouth 2 (two) times daily. 180 tablet 1  . mometasone (ASMANEX 60 METERED DOSES) 220 MCG/INH inhaler Inhale 2 puffs into the lungs 2 (two) times daily. 1 Inhaler 11  . Omega-3 Fatty Acids ([employer] FISH OIL) 1000 MG CAPS Take 1,000 mg by mouth daily.    Marland Kitchen testosterone cypionate (DEPOTESTOSTERONE CYPIONATE) 200 MG/ML injection INJECT 1.5ML INTO THE MUSCLE EVERY 14 DAYS 10 mL 0  . vitamin B-12 (CYANOCOBALAMIN) 1000 MCG tablet Take 1,000 mcg by mouth every morning.     . vitamin E 400 UNIT capsule Take 400 Units by mouth every morning.      Current Facility-Administered Medications  Medication Dose Route Frequency Provider Last Rate Last Dose  . Influenza vac split quadrivalent PF (FLUARIX) injection 0.5 mL  0.5 mL Intramuscular Once Patrici Ranks, MD        Review of Systems  Constitutional: Positive for malaise/fatigue. Negative for fever, chills and weight loss.       Tired all the time.  HENT: Positive for hearing loss. Negative for congestion, nosebleeds, sore throat and tinnitus.   Eyes: Negative.  Negative for blurred vision, double vision, pain and discharge.  Respiratory: Negative.  Negative for cough, hemoptysis, sputum production, shortness of breath and wheezing.   Cardiovascular: Negative.  Negative for chest pain, palpitations, claudication, leg swelling and PND.  Gastrointestinal: Negative.  Negative for heartburn, nausea, vomiting, abdominal pain, diarrhea, constipation, blood in stool and melena.  Genitourinary: Negative.  Negative for dysuria, urgency, frequency and hematuria.  Musculoskeletal: Negative.  Negative for myalgias, joint pain and  falls.  Skin: Negative.  Negative for itching and rash.       Skin infection on his right arm. His wife mentions that he also struggles with fungal issues.  Neurological: Negative.  Negative for dizziness, tingling, tremors, sensory change, speech change, focal weakness, seizures, loss of consciousness, weakness and headaches.  Endo/Heme/Allergies: Negative.  Does not bruise/bleed easily.  Psychiatric/Behavioral: Negative.  Negative for depression, suicidal ideas, memory loss and substance abuse. The patient is not nervous/anxious and does not have insomnia.   All other systems reviewed and are negative.  14 point ROS was done and is otherwise as detailed above or in HPI   PHYSICAL EXAMINATION: ECOG PERFORMANCE STATUS: 1 - Symptomatic but completely ambulatory  Filed Vitals:   11/21/15 1506  BP: 179/77  Pulse: 69  Temp: 97.5 F (36.4 C)  Resp: 22   Filed Weights   11/21/15 1506  Weight: 264 lb 12.8 oz (120.112 kg)    Physical Exam  Constitutional: He is oriented to person, place, and time and well-developed, well-nourished, and in no distress.  HENT:  Head: Normocephalic and atraumatic.  Nose: Nose normal.  Mouth/Throat: Oropharynx is clear and moist. No oropharyngeal exudate.  Eyes: Conjunctivae and EOM are  normal. Pupils are equal, round, and reactive to light. Right eye exhibits no discharge. Left eye exhibits no discharge. No scleral icterus.  Neck: Normal range of motion. Neck supple. No tracheal deviation present. No thyromegaly present.  Cardiovascular: Normal rate, regular rhythm and normal heart sounds.  Exam reveals no gallop and no friction rub.   No murmur heard. Pulmonary/Chest: Effort normal and breath sounds normal. He has no wheezes. He has no rales.  Abdominal: Soft. Bowel sounds are normal. He exhibits no distension and no mass. There is no tenderness. There is no rebound and no guarding.  Pain when palpated due to old broken rib injury.  Musculoskeletal:  Normal range of motion. He exhibits no edema.  Lymphadenopathy:    He has no cervical adenopathy.  Neurological: He is alert and oriented to person, place, and time. He has normal reflexes. No cranial nerve deficit. Gait normal. Coordination normal.  Skin: Skin is warm and dry. No rash noted.  Psychiatric: Mood, memory, affect and judgment normal.  Nursing note and vitals reviewed.   LABORATORY DATA:  I have reviewed the data as listed Lab Results  Component Value Date   WBC 26.7* 11/21/2015   HGB 13.9 11/21/2015   HCT 43.7 11/21/2015   MCV 83.4 11/21/2015   PLT 177 11/21/2015   CMP     Component Value Date/Time   NA 138 11/07/2015 1621   NA 138 01/31/2013   K 5.2 11/07/2015 1621   CL 104 11/07/2015 1621   CO2 20 11/07/2015 1621   GLUCOSE 82 11/07/2015 1621   BUN 19 11/07/2015 1621   BUN 19 01/31/2013   CREATININE 1.58* 11/07/2015 1621   CREATININE 1.68* 05/21/2014 2130   CREATININE 1.6* 01/31/2013   CALCIUM 9.2 11/07/2015 1621   PROT 6.4 11/07/2015 1621   ALBUMIN 4.5 11/07/2015 1621   AST 13 11/07/2015 1621   ALT 11 11/07/2015 1621   ALKPHOS 40 11/07/2015 1621   BILITOT 0.6 11/07/2015 1621   GFRNONAA 46* 04/03/2015 1550   GFRNONAA 41* 05/21/2014 2130   GFRAA 53* 04/03/2015 1550   GFRAA 48* 05/21/2014 2130   Flow cytometry performed on 11/07/15 by Dr. Buelah Manis shows findings consistent with CLL/SLL CBC from 11/04/15 shows a total WBC of 25,000. Normal hemoglobin, HCT, PLT     ASSESSMENT & PLAN:  CLL Normal hemoglobin, HCT, PLT count History of RCC status post S/P nephrectomy, no medical records available CKD, Stage III  We will start assessment of [name] Ball CLL with bloodwork today. I will bring him back in 2 months. At that time we will answer any questions he and his family may have.   I spent a significant portion of the appointment discussing the nature of CLL with [name] Ball and his family, and answering their questions about the disease.  I also  recommend that he receive a flu shot every year, and keep up with his pneumonia vaccine. He should not receive live vaccinations.  I will give him reading information on CLL today. The patient, [name] Ball, has [place].  We will release notes to him through [facility].  Orders Placed This Encounter  Procedures  . CBC with Differential  . Save smear  . Lactate dehydrogenase  . IgG, IgA, IgM  . Immunofixation electrophoresis  . Protein electrophoresis, serum   All questions were answered. The patient knows to call the clinic with any problems, questions or concerns.  This document serves as a record of services personally performed by Ancil Linsey, MD. It  was created on her behalf by Arlyce Harman, a trained medical scribe. The creation of this record is based on the scribe's personal observations and the provider's statements to them. This document has been checked and approved by the attending provider.  I have reviewed the above documentation for accuracy and completeness, and I agree with the above.  This note was electronically signed.    Molli Hazard, MD

## 2016-01-04 ENCOUNTER — Encounter: Payer: Self-pay | Admitting: Family Medicine

## 2016-01-09 DIAGNOSIS — J449 Chronic obstructive pulmonary disease, unspecified: Secondary | ICD-10-CM | POA: Diagnosis not present

## 2016-01-22 ENCOUNTER — Telehealth (HOSPITAL_COMMUNITY): Payer: Self-pay | Admitting: Hematology & Oncology

## 2016-01-22 ENCOUNTER — Encounter (HOSPITAL_COMMUNITY): Payer: Medicare Other | Attending: Hematology & Oncology | Admitting: Hematology & Oncology

## 2016-01-22 ENCOUNTER — Encounter (HOSPITAL_COMMUNITY): Payer: Self-pay | Admitting: Hematology & Oncology

## 2016-01-22 VITALS — BP 175/84 | HR 69 | Temp 97.7°F | Resp 20 | Wt 270.6 lb

## 2016-01-22 DIAGNOSIS — D801 Nonfamilial hypogammaglobulinemia: Secondary | ICD-10-CM

## 2016-01-22 DIAGNOSIS — N183 Chronic kidney disease, stage 3 (moderate): Secondary | ICD-10-CM | POA: Diagnosis not present

## 2016-01-22 DIAGNOSIS — Z85528 Personal history of other malignant neoplasm of kidney: Secondary | ICD-10-CM | POA: Diagnosis not present

## 2016-01-22 DIAGNOSIS — C9110 Chronic lymphocytic leukemia of B-cell type not having achieved remission: Secondary | ICD-10-CM | POA: Diagnosis not present

## 2016-01-22 NOTE — Progress Notes (Signed)
[facility] Note  Patient Care Team: Alycia Rossetti, MD as PCP - General (Family Medicine) Fay Records, MD as Consulting Physician (Cardiology)  CHIEF COMPLAINTS:  CLL Flow cytometry performed on 11/07/15 by Dr. Buelah Manis shows findings consistent with CLL/SLL CBC from 11/04/15 shows a total WBC of 25,000. Normal hemoglobin, HCT, PLT Stage III chronic kidney disease Jehovah's Witness  HISTORY OF PRESENTING ILLNESS:  [name] Ball 65 y.o. male is here for follow-up of CLL.  Mr. Stupak is accompanied by his wife. Mr. Wieber is mostly deaf so his wife signed to him most of our visit to help him understand everything.  He gets tired a lot and often eats when he is tired. His wife reports that he often deals with pain by going to sleep. She is concerned about "how much tired is too tired" and if he should limit napping throughout the day. He has been taking more benadryl because of allergies, as well. He has ongoing trouble with sinus infections.  He had an infection from his denture plate while they recently traveled to [place]. A day and a half after his last dose of clindamycin prescribed by Dr. Buelah Manis, his neck became swollen and hurt again. Dr. Buelah Manis performed CT scans and said that it was a reactionary lymph node. His wife remarks that he pokes this lymph node often. He continues to experience discomfort in this right-sided neck lymph node.  While on vacation in [place], his wife reports that he lost weight. She does note that they were active and tried to eat less while in [place]. Marland Kitchen   He has a chronic skin problem, where his skin breaks open easily.   Reports that over the past couple of weeks he has been experiencing loss of concentration. His wife has not noticed a lack of concentration, however if he is it is possibly because of his fatigue. 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    MEDICAL HISTORY:  Past Medical History  Diagnosis Date  . Allergy   . COPD (chronic obstructive pulmonary disease) ([place])   . Hypertension   . Thyroid disease     hypothryoid  . Migraine   . Ulcer   . Diverticulitis   . Solar purpura ([place])   . Asthma   . Low testosterone   . GERD (gastroesophageal reflux disease)   . Arthritis   . Ventral hernia FEB 2015 CT     Large infra umbilical ventral hernia containing loops of small  . Diverticulosis     colonoscopy 3/15  . Cancer ([place])     kidney  . CLL (chronic lymphocytic leukemia) ([place]) 2016    SURGICAL HISTORY: Past Surgical History  Procedure Laterality Date  . Kidney cyst removal  jan 2005    renal cell carcinoma  . Abdominal mesh inserted  june 2006, may 2008,dec 2010  . Knee minescus repair Left feb 2007  . Cholecystectomy  june 2007  . Colon surgery  sept 2007  . Colonoscopy    . Hernia repair  05/2005 5/20108, 11/2009    with mesh  . Colonoscopy N/A 02/20/2014    Procedure: COLONOSCOPY;  Surgeon: Danie Binder, MD;  Location: [facility];  Service: Endoscopy;  Laterality: N/A;  . Nephrectomy      SOCIAL HISTORY: Social History   Social History  . Marital Status: Married  Spouse Name: N/A  . Number of Children: N/A  . Years of Education: N/A   Occupational History  . Not on file.   Social History Main Topics  . Smoking status: Former Smoker    Quit date: 12/06/1996  . Smokeless tobacco: Never Used  . Alcohol Use: Yes     Comment: a few shots a few times a week  . Drug Use: No  . Sexual Activity: Not on file   Other Topics Concern  . 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   He says he's been married 98 years, almost 81. He has 4 children and 6 grandchildren. No great-grandchildren yet.  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 He also cooks. He also worked in Public relations account executive.  He used to smoke. He quit about 17 years ago. He says he has a shot of whisky one or two nights a week, so he doesn't have problems with alcohol. He may, however, have a problem with soda -- Solicitor.  In terms of his hobbies, he jokes that he "likes to do nothing." He cooks. He says he's the "second best cook on this side of the [place], but I'm not bragging." [place] food is his specialty, he says. He says he makes an excellent leg of lamb, and a great Moussaka. His wife makes a good Baklava. He also has a good [place] chicken recipe.  Why [place] food? When he was younger, he cooked and worked in [place], [place] with a family that was all [place].  They had a [place] festival coming up. So grandma came in and they cooked for the [place] -- maybe 2500 people.  She showed him each recipe and she was really tough. He says it really stuck with him ever since. He says when he gets his recipes, he gets them from families; family recipes. He says he's got about 3,000 of them. He says he doesn't give his recipes to anybody, but he'll make them. He says "I make the best pizza in the [place]." He says he makes a good soup, too. It's clear that he loves to cook very much.  FAMILY HISTORY: Family History  Problem Relation Age of Onset  . Cancer Mother     lung  . Colon cancer Neg Hx    indicated that his mother is deceased. He indicated that his father is deceased.    His biological father died of a stroke in bed; his mother died of brain cancer, metastasized breast cancer. They both died at 15. He says jokingly, "That's making me a little apprehensive, but that's okay." His mother died 41 years ago. He has a half brother and a half sister. His sister had juvenile diabetes; his brother's fine. No family history of CLL on his side that he knows of. They are familiar with CLL in  general thanks to his wife's mother having it.  ALLERGIES:  is allergic to contrast media; other; strawberry extract; and ether.  MEDICATIONS:  Current Outpatient Prescriptions  Medication Sig Dispense Refill  . Glucosamine-Chondroit-Vit C-Mn (GLUCOSAMINE 1500 COMPLEX) CAPS Take 1,200 capsules by mouth 2 (two) times daily.    Marland Kitchen acetaminophen (TYLENOL) 325 MG tablet Take 650 mg by mouth every 6 (six) hours as needed.    Marland Kitchen  albuterol (PROVENTIL) (2.5 MG/3ML) 0.083% nebulizer solution TAKE 3MLS(1 VIAL) VIA NEBULIZER EVERY 6 HOURS AS NEEDED FOR WHEEZING OR SHORTNESS OF BREATH 150 mL 0  . allopurinol (ZYLOPRIM) 100 MG tablet Take 1 tablet (100 mg total) by mouth at bedtime. 90 tablet 1  . amLODipine (NORVASC) 5 MG tablet Take 1 tablet (5 mg total) by mouth daily. 90 tablet 3  . ATROVENT HFA 17 MCG/ACT inhaler INHALE 2 PUFFS BY MOUTH INTO THE LUNGS EVERY 6 HOURS 12.9 g 11  . calcium carbonate (TUMS - DOSED IN MG ELEMENTAL CALCIUM) 500 MG chewable tablet Chew 2-3 tablets by mouth once as needed for indigestion or heartburn. Reported on 11/21/2015    . cholecalciferol (VITAMIN D) 1000 UNITS tablet Take 1,000 Units by mouth every morning.     . citalopram (CELEXA) 10 MG tablet Take 1 tablet (10 mg total) by mouth daily. 90 tablet 2  . dexlansoprazole (DEXILANT) 60 MG capsule Take 1 capsule (60 mg total) by mouth daily. 180 capsule 1  . diphenhydrAMINE (BENADRYL) 25 MG tablet Take 25 mg by mouth every evening.     . eletriptan (RELPAX) 20 MG tablet Take 1 tablet (20 mg total) by mouth as needed for migraine. One tablet by mouth at onset of headache. May repeat in 2 hours if headache persists or recurs. 10 tablet 3  . HYDROcodone-acetaminophen (NORCO) 10-325 MG tablet Take 1 tablet by mouth every 6 (six) hours as needed. 120 tablet 0  . levalbuterol (XOPENEX HFA) 45 MCG/ACT inhaler Inhale 1-2 puffs into the lungs every 4 (four) hours as needed for wheezing. 1 Inhaler 6  . levothyroxine (SYNTHROID,  LEVOTHROID) 50 MCG tablet Take 1 tablet (50 mcg total) by mouth daily before breakfast. 90 tablet 3  . loratadine (CLARITIN) 10 MG tablet Take 10 mg by mouth every morning.     . metoprolol (LOPRESSOR) 50 MG tablet Take 1 tablet (50 mg total) by mouth 2 (two) times daily. 180 tablet 1  . mometasone (ASMANEX 60 METERED DOSES) 220 MCG/INH inhaler Inhale 2 puffs into the lungs 2 (two) times daily. 1 Inhaler 11  . testosterone cypionate (DEPOTESTOSTERONE CYPIONATE) 200 MG/ML injection INJECT 1.5ML INTO THE MUSCLE EVERY 14 DAYS 10 mL 0  . vitamin B-12 (CYANOCOBALAMIN) 1000 MCG tablet Take 1,000 mcg by mouth every morning.     . vitamin E 400 UNIT capsule Take 400 Units by mouth every morning.      No current facility-administered medications for this visit.    Review of Systems  Constitutional: Positive for malaise/fatigue. Negative for fever, chills and weight loss.       Tired all the time.  HENT: Positive for hearing loss. Negative for congestion, nosebleeds, sore throat and tinnitus.        Swollen, painful right-sided neck lymph node.  Eyes: Negative.  Negative for blurred vision, double vision, pain and discharge.  Respiratory: Negative.  Negative for cough, hemoptysis, sputum production, shortness of breath and wheezing.   Cardiovascular: Negative.  Negative for chest pain, palpitations, claudication, leg swelling and PND.  Gastrointestinal: Negative.  Negative for heartburn, nausea, vomiting, abdominal pain, diarrhea, constipation, blood in stool and melena.  Genitourinary: Negative.  Negative for dysuria, urgency, frequency and hematuria.  Musculoskeletal: Negative.  Negative for myalgias, joint pain and falls.  Skin: Negative.  Negative for itching and rash.  Neurological: Negative.  Negative for dizziness, tingling, tremors, sensory change, speech change, focal weakness, seizures, loss of consciousness, weakness and headaches.  Endo/Heme/Allergies: Negative.  Does  not bruise/bleed  easily.  Psychiatric/Behavioral: Negative.  Negative for depression, suicidal ideas, memory loss and substance abuse. The patient is not nervous/anxious and does not have insomnia.   All other systems reviewed and are negative.  14 point ROS was done and is otherwise as detailed above or in HPI   PHYSICAL EXAMINATION: ECOG PERFORMANCE STATUS: 1 - Symptomatic but completely ambulatory  Filed Vitals:   01/22/16 1249  BP: 175/84  Pulse: 69  Temp: 97.7 F (36.5 C)  Resp: 20   Filed Weights   01/22/16 1249  Weight: 270 lb 9.6 oz (122.743 kg)    Physical Exam  Constitutional: He is oriented to person, place, and time and well-developed, well-nourished, and in no distress.  HENT:  Head: Normocephalic and atraumatic.  Nose: Nose normal.  Mouth/Throat: Oropharynx is clear and moist. No oropharyngeal exudate.  Eyes: Conjunctivae and EOM are normal. Pupils are equal, round, and reactive to light. Right eye exhibits no discharge. Left eye exhibits no discharge. No scleral icterus.  Neck: Normal range of motion. Neck supple. No tracheal deviation present. No thyromegaly present.  Complains of pain with palpation of the right neck  Cardiovascular: Normal rate, regular rhythm and normal heart sounds.  Exam reveals no gallop and no friction rub.   No murmur heard. Pulmonary/Chest: Effort normal and breath sounds normal. He has no wheezes. He has no rales.  Abdominal: Soft. Bowel sounds are normal. He exhibits no distension and no mass. There is no tenderness. There is no rebound and no guarding.  Pain when palpated due to old broken rib injury.  Musculoskeletal: Normal range of motion. He exhibits no edema.  Lymphadenopathy:    He has no cervical adenopathy.  Neurological: He is alert and oriented to person, place, and time. He has normal reflexes. No cranial nerve deficit. Gait normal. Coordination normal.  Skin: Skin is warm and dry. No rash noted.  Psychiatric: Mood, memory, affect and  judgment normal.  Nursing note and vitals reviewed.   LABORATORY DATA:  I have reviewed the data as listed Lab Results  Component Value Date   WBC 26.7* 11/21/2015   HGB 13.9 11/21/2015   HCT 43.7 11/21/2015   MCV 83.4 11/21/2015   PLT 177 11/21/2015   CMP     Component Value Date/Time   NA 138 11/07/2015 1621   NA 138 01/31/2013   K 5.2 11/07/2015 1621   CL 104 11/07/2015 1621   CO2 20 11/07/2015 1621   GLUCOSE 82 11/07/2015 1621   BUN 19 11/07/2015 1621   BUN 19 01/31/2013   CREATININE 1.58* 11/07/2015 1621   CREATININE 1.68* 05/21/2014 2130   CREATININE 1.6* 01/31/2013   CALCIUM 9.2 11/07/2015 1621   PROT 6.4 11/07/2015 1621   ALBUMIN 4.5 11/07/2015 1621   AST 13 11/07/2015 1621   ALT 11 11/07/2015 1621   ALKPHOS 40 11/07/2015 1621   BILITOT 0.6 11/07/2015 1621   GFRNONAA 46* 04/03/2015 1550   GFRNONAA 41* 05/21/2014 2130   GFRAA 53* 04/03/2015 1550   GFRAA 48* 05/21/2014 2130    Flow cytometry performed on 11/07/15 by Dr. Buelah Manis shows findings consistent with CLL/SLL CBC from 11/04/15 shows a total WBC of 25,000. Normal hemoglobin, HCT, PLT     Results for MATTEW, MCCULLA (MRN [id number])   Ref. Range 11/21/2015 16:35  IgG (Immunoglobin G), Serum Latest Ref Range: 8042975165 mg/dL 517 (L)  IgA Latest Ref Range: 61-437 mg/dL 60 (L)  IgM, Serum Latest Ref Range: 20-172 mg/dL 5 (  L)     RADIOLOGY: CLINICAL DATA: Right-sided lymphadenopathy. Palpable knot on the right neck. Recent diagnosis of lymphocytic leukemia.  EXAM: CT NECK WITHOUT CONTRAST  TECHNIQUE: Multidetector CT imaging of the neck was performed following the standard protocol without intravenous contrast.  COMPARISON: None.  FINDINGS: Pharynx and larynx: No focal mucosal or submucosal lesions are present. Vocal cords are midline and symmetric. There is mild prominence of the adenoid tissue. Fullness is noted in the lingual tonsils. No discrete lesion is evident.  Salivary  glands: Intra parotid lymph nodes are present bilaterally. The parotid and submandibular glands are otherwise normal.  Thyroid: Negative  Lymph nodes: Multiple bilateral ovoid lymph nodes are present in both sides the neck. These are not pathologically enlarged but the number is significantly increased over expectation.  Vascular: Atherosclerotic changes are present at the aorta and branch vessels. Minimal calcifications are present at the carotid bifurcations bilaterally without significant stenosis.  Limited intracranial: Unremarkable.  Visualized orbits: Within normal limits  Mastoids and visualized paranasal sinuses: The paranasal sinuses are clear. A left mastoid and middle ear effusion is present. No obstructing nasopharyngeal lesion is present.  Skeleton: Bone windows demonstrate reversal of the normal cervical lordosis. There is chronic loss of disc height at C4-5 and C5-6. Uncovertebral spurring is most noted at these 2 levels. The patient is edentulous. No focal lytic or blastic lesions are present.  Upper chest: A 12 x 11 mm right upper lobe pulmonary nodule is present. This is well-defined without spiculation. No other focal nodule, mass, or airspace disease is present. Sub cm right peritracheal lymph nodes are noted.  IMPRESSION: 1. Multiple sub cm ovoid lymph nodes within the neck bilaterally. Although no focally enlarged nodes are present, these could be related to the recent diagnosis of leukemia. 2. No focal mass lesion subjacent to the area marked. This is near the right submandibular gland. 3. Advanced spondylosis in the cervical spine. 4. Atherosclerosis.   Electronically Signed  By: San Morelle M.D.  On: 01/02/2016 16:49   ASSESSMENT & PLAN:  CLL Hypogammaglobulenemia Normal hemoglobin, HCT, PLT count History of RCC status post S/P nephrectomy, no medical records available CKD, Stage III  I spent a significant portion of  the appointment discussing the nature of CLL with Mr. Kulczyk and his wife again, and answering their questions about the disease, indication for treatment. We discussed fatigue, the subjective nature of fatigue in making decisions regarding treatment of CLL.  We reviewed his CT scans of the neck.   We discussed IVIG and its role in hypogammaglobulinemia from CLL and recurrent infections.  When given in the setting of hypogammaglobulinemia, antibody deficiency disorders, and/or other immunodeficiency states, IVIG acts by providing adequate concentrations of antibodies against a broad range of pathogens. I advised the patient that he would qualify for IVIG therapy given his low antibody levels, his ongoing difficulties with recurrent sinusitis. I suspect his fatigue may be improved somewhat over time as well. I have recommended trying IVIG therapy.   He will begin IV IgG next week and return in 3 months for reassessment. If the right-sided neck lymph node in his neck continues to bother him, he will contact us and we will refer him to Dr. Benjamine Mola.   Orders Placed This Encounter  Procedures  . CBC with Differential    Standing Status: Future     Number of Occurrences:      Standing Expiration Date: 01/21/2017  . Comprehensive metabolic panel    Standing Status: Future  Number of Occurrences:      Standing Expiration Date: 01/21/2017  . IgG, IgA, IgM    Standing Status: Future     Number of Occurrences:      Standing Expiration Date: 01/21/2017   All questions were answered. The patient knows to call the clinic with any problems, questions or concerns.  This document serves as a record of services personally performed by Ancil Linsey, MD. It was created on her behalf by Arlyce Harman, a trained medical scribe. The creation of this record is based on the scribe's personal observations and the provider's statements to them. This document has been checked and approved by the attending  provider.  I have reviewed the above documentation for accuracy and completeness, and I agree with the above.  This note was electronically signed.    Molli Hazard, MD

## 2016-01-22 NOTE — Patient Instructions (Addendum)
[place] at [facility] Discharge Instructions  RECOMMENDATIONS MADE BY THE CONSULTANT AND ANY TEST RESULTS WILL BE SENT TO YOUR REFERRING PHYSICIAN.   Exam and discussion by Dr Whitney Muse today  Its normal to have lymph nodes that are increased in size.  Your IGG antibody level was low, people with CLL dont make normal antibody levels and can get infections easily.  If you keep getting re-current infections we can give you IVIG therapy. Its once a month and its IV form.  Next treatment would be imbruvica.  This would treat the CLL.  It will take about 3 months before you will start to feel a difference.  Fatigue is a reason to treat to CLL but fatigue is subjective.  But if you decide your fatigue is from the CLL then we can treat.  If it keeps being painful then let us know then we could send you to a ENT and see what they think about your lymph node.  We are going to get you scheduled to IVIG next week   Return to see the doctor with 2nd treatment of IVIG with lab work Please call the clinic if you have any questions or concerns       Thank you for choosing [place] at [facility] to provide your oncology and hematology care.  To afford each patient quality time with our provider, please arrive at least 15 minutes before your scheduled appointment time.   Beginning January 23rd 2017 lab work for the [facility] will be done in the  Main lab at [facility] on 1st floor. If you have a lab appointment with the [facility] please come in thru the  Main Entrance and check in at the main information desk  You need to re-schedule your appointment should you arrive 10 or more minutes late.  We strive to give you quality time with our providers, and arriving late affects you and other patients whose appointments are after yours.  Also, if you no show three or more times for appointments you may be dismissed from the clinic at the  providers discretion.     Again, thank you for choosing [facility].  Our hope is that these requests will decrease the amount of time that you wait before being seen by our physicians.       _____________________________________________________________  Should you have questions after your visit to [facility], please contact our office at [phone] between the hours of 8:30 a.m. and 4:30 p.m.  Voicemails left after 4:30 p.m. will not be returned until the following business day.  For prescription refill requests, have your pharmacy contact our office.       Immune Globulin Injection What is this medicine? IMMUNE GLOBULIN (im MUNE GLOB yoo lin) helps to prevent or reduce the severity of certain infections in patients who are at risk. This medicine is collected from the pooled blood of many donors. It is used to treat immune system problems, thrombocytopenia, and Kawasaki syndrome. This medicine may be used for other purposes; ask your health care provider or pharmacist if you have questions. What should I tell my health care provider before I take this medicine? They need to know if you have any of these conditions: - diabetes - extremely low or no immune antibodies in the blood - heart disease - history of blood clots - hyperprolinemia - infection in the blood, sepsis - kidney disease -  taking medicine that may change kidney function - ask your health care provider about your medicine - an unusual or allergic reaction to human immune globulin, albumin, maltose, sucrose, polysorbate 80, other medicines, foods, dyes, or preservatives - pregnant or trying to get pregnant - breast-feeding How should I use this medicine? This medicine is for injection into a muscle or infusion into a vein or skin. It is usually given by a health care professional in a hospital or clinic setting. In rare cases, some brands of this medicine might be given at home. You  will be taught how to give this medicine. Use exactly as directed. Take your medicine at regular intervals. Do not take your medicine more often than directed. Talk to your pediatrician regarding the use of this medicine in children. Special care may be needed. Overdosage: If you think you have taken too much of this medicine contact a poison control center or emergency room at once. NOTE: This medicine is only for you. Do not share this medicine with others. What if I miss a dose? It is important not to miss your dose. Call your doctor or health care professional if you are unable to keep an appointment. If you give yourself the medicine and you miss a dose, take it as soon as you can. If it is almost time for your next dose, take only that dose. Do not take double or extra doses. What may interact with this medicine? -aspirin and aspirin-like medicines -cisplatin -cyclosporine -medicines for infection like acyclovir, adefovir, amphotericin B, bacitracin, cidofovir, foscarnet, ganciclovir, gentamicin, pentamidine, vancomycin -NSAIDS, medicines for pain and inflammation, like ibuprofen or naproxen -pamidronate -vaccines -zoledronic acid This list may not describe all possible interactions. Give your health care provider a list of all the medicines, herbs, non-prescription drugs, or dietary supplements you use. Also tell them if you smoke, drink alcohol, or use illegal drugs. Some items may interact with your medicine. What should I watch for while using this medicine? Your condition will be monitored carefully while you are receiving this medicine. This medicine is made from pooled blood donations of many different people. It may be possible to pass an infection in this medicine. However, the donors are screened for infections and all products are tested for HIV and hepatitis. The medicine is treated to kill most or all bacteria and viruses. Talk to your doctor about the risks and benefits of this  medicine. Do not have vaccinations for at least 14 days before, or until at least 3 months after receiving this medicine. What side effects may I notice from receiving this medicine? Side effects that you should report to your doctor or health care professional as soon as possible: -allergic reactions like skin rash, itching or hives, swelling of the face, lips, or tongue -breathing problems -chest pain or tightness -fever, chills -headache with nausea, vomiting -neck pain or difficulty moving neck -pain when moving eyes -pain, swelling, warmth in the leg -problems with balance, talking, walking -sudden weight gain -swelling of the ankles, feet, hands -trouble passing urine or change in the amount of urine Side effects that usually do not require medical attention (report to your doctor or health care professional if they continue or are bothersome): -dizzy, drowsy -flushing -increased sweating -leg cramps -muscle aches and pains -pain at site where injected This list may not describe all possible side effects. Call your doctor for medical advice about side effects. You may report side effects to FDA at 1-800-FDA-1088. Where should I keep  my medicine? Keep out of the reach of children. This drug is usually given in a hospital or clinic and will not be stored at home. In rare cases, some brands of this medicine may be given at home. If you are using this medicine at home, you will be instructed on how to store this medicine. Throw away any unused medicine after the expiration date on the label. NOTE: This sheet is a summary. It may not cover all possible information. If you have questions about this medicine, talk to your doctor, pharmacist, or health care provider.    2016, Elsevier/Gold Standard. (2009-02-12 11:44:49)       Ibrutinib capsules What is this medicine? IBRUTINIB (eye BROO ti nib) is a medicine that targets proteins in cancer cells and stops the cancer cells from  growing. It is used to treat mantle cell lymphoma, chronic lymphocytic leukemia, small lymphocytic lymphoma, and Waldenstrom macroglobulinemia. This medicine may be used for other purposes; ask your health care provider or pharmacist if you have questions. What should I tell my health care provider before I take this medicine? They need to know if you have any of these conditions: -bleeding disorders -diabetes -heart disease -high blood pressure -high cholesterol -history of irregular heartbeat -infection -liver disease -recent surgery -smoke tobacco -take medicines that treat or prevent blood clots -an unusual or allergic reaction to ibrutinib, other medicines, foods, dyes, or preservatives -pregnant or trying to get pregnant -breast-feeding How should I use this medicine? Take this medicine by mouth with a glass of water. Follow the directions on the prescription label. Do not cut, crush or chew this medicine. Do not take with grapefruit juice or eat [place] oranges. Take your medicine at regular intervals. Do not take it more often than directed. Do not stop taking except on your doctor's advice. Talk to your pediatrician regarding the use of this medicine in children. Special care may be needed. Overdosage: If you think you have taken too much of this medicine contact a poison control center or emergency room at once. NOTE: This medicine is only for you. Do not share this medicine with others. What if I miss a dose? If you miss a dose, take it as soon as you can. If it is almost time for your next dose, take only that dose. Do not take double or extra doses. What may interact with this medicine? Do not take this medicine with any of the following medications: -boceprevir -bosentan -carbamazepine -certain medicines for fungal infections like ketoconazole, itraconazole, posaconazole, and  voriconazole -chloramphenicol -clarithromycin -conivaptan -delavirdine -efavirenz -enzalutamide -grapefruit juice or [place] oranges -indinavir -isoniazid -lanreotide or octreotide -nefazodone -nelfinavir -nevirapine -nicardipine -phenobarbital -phenytoin -rifampin -ritonavir -saquinavir -se[place] oranges -st. john's wort -telaprevir -telithromycin -tipranavir This medicine may also interact with the following medications: -amiodarone -amitriptyline -amprenavir or fosamprenavir -aprepitant or fosaprepitant -atazanavir -bromocriptine -ciprofloxacin -crizotinib -danazol -darunavir -dasatinib -digoxin -diltiazem -erythromycin -fluconazole -fluvoxamine -imatinib -lapatinib -methotrexate -mifepristone, RU-486 -quinine -verapamil -zafirlukast This list may not describe all possible interactions. Give your health care provider a list of all the medicines, herbs, non-prescription drugs, or dietary supplements you use. Also tell them if you smoke, drink alcohol, or use illegal drugs. Some items may interact with your medicine. What should I watch for while using this medicine? This drug may make you feel generally unwell. This is not uncommon, as chemotherapy can affect healthy cells as well as cancer cells. Report any side effects. Continue your course of treatment even though you feel ill unless your  doctor tells you to stop. Do not become pregnant while taking this medicine or for 1 month after stopping it. Women should inform their doctor if they wish to become pregnant or think they might be pregnant. Men should not father a child while taking this medicine and for 1 month after stopping it. There is a potential for serious side effects to an unborn child. Talk to your health care professional or pharmacist for more information. This medicine may increase your risk to bruise or bleed. Call your doctor or health care professional if you notice any unusual bleeding. If  you are going to have surgery or any other procedures, tell your doctor you are taking this medicine. Tell your dentist and dental surgeon that you are taking this medicine. You should not have major dental surgery while on this medicine. See your dentist to have a dental exam and fix any dental problems before starting this medicine. Call your doctor or health care professional for advice if you get a fever, chills or sore throat, or other symptoms of a cold or flu. Do not treat yourself. This drug decreases your body's ability to fight infections. Try to avoid being around people who are sick. You may need blood work done while you are taking this medicine. Talk to your doctor about your risk of cancer. You may be more at risk for certain types of cancers if you take this medicine. What side effects may I notice from receiving this medicine? Side effects that you should report to your doctor or health care professional as soon as possible: -allergic reactions like skin rash, itching or hives, swelling of the face, lips, or tongue -low blood counts - this medicine may decrease the number of white blood cells, red blood cells and platelets. You may be at increased risk for infections and bleeding -signs or symptoms of bleeding such as bloody or black, tarry stools; red or dark-brown urine; spitting up blood or brown material that looks like coffee grounds; red spots on the skin; unusual bruising or bleeding from the eye, gums, or nose; confusion; trouble speaking or understanding; severe headaches; weakness; or dizziness -signs and symptoms of a dangerous change in heartbeat or heart rhythm like chest pain; dizziness; fast or irregular heartbeat; palpitations; feeling faint or lightheaded, falls; breathing problems -signs and symptoms of infection like fever or chills; cough; sore throat; or pain when urinating -signs and symptoms of kidney injury like trouble passing urine or change in the amount of  urine Side effects that usually do not require medical attention (report to your doctor or health care professional if they continue or are bothersome): -bone pain -diarrhea -muscle pain -nausea -tiredness This list may not describe all possible side effects. Call your doctor for medical advice about side effects. You may report side effects to FDA at 1-800-FDA-1088. Where should I keep my medicine? Keep out of the reach of children. Store between 20 and 25 degrees C (68 and 77 degrees F). Keep this medicine in the original container. Throw away any unused medicine after the expiration date. NOTE: This sheet is a summary. It may not cover all possible information. If you have questions about this medicine, talk to your doctor, pharmacist, or health care provider.    2016, Elsevier/Gold Standard. (2015-04-16 11:40:59)

## 2016-01-27 DIAGNOSIS — D801 Nonfamilial hypogammaglobulinemia: Secondary | ICD-10-CM | POA: Insufficient documentation

## 2016-01-30 ENCOUNTER — Encounter (HOSPITAL_COMMUNITY): Payer: Self-pay

## 2016-01-30 ENCOUNTER — Encounter (HOSPITAL_BASED_OUTPATIENT_CLINIC_OR_DEPARTMENT_OTHER): Payer: Medicare Other

## 2016-01-30 VITALS — BP 155/66 | HR 80 | Temp 98.0°F | Resp 22 | Wt 265.8 lb

## 2016-01-30 DIAGNOSIS — D801 Nonfamilial hypogammaglobulinemia: Secondary | ICD-10-CM

## 2016-01-30 DIAGNOSIS — C9110 Chronic lymphocytic leukemia of B-cell type not having achieved remission: Secondary | ICD-10-CM

## 2016-01-30 DIAGNOSIS — T7840XA Allergy, unspecified, initial encounter: Secondary | ICD-10-CM

## 2016-01-30 MED ORDER — IMMUNE GLOBULIN (HUMAN) 5 GM/100ML IV SOLN
400.0000 mg/kg | Freq: Once | INTRAVENOUS | Status: DC
  Filled 2016-01-30: qty 1000

## 2016-01-30 MED ORDER — METHYLPREDNISOLONE SODIUM SUCC 40 MG IJ SOLR: 40.0000 mg | Freq: Once | INTRAMUSCULAR | Status: DC

## 2016-01-30 MED ORDER — DIPHENHYDRAMINE HCL 50 MG/ML IJ SOLN
25.0000 mg | Freq: Once | INTRAMUSCULAR | Status: AC
  Administered 2016-01-30: 25 mg via INTRAVENOUS

## 2016-01-30 MED ORDER — METHYLPREDNISOLONE SODIUM SUCC 125 MG IJ SOLR
40.0000 mg | Freq: Once | INTRAMUSCULAR | Status: AC
  Administered 2016-01-30: 40 mg via INTRAVENOUS

## 2016-01-30 MED ORDER — DIPHENHYDRAMINE HCL 50 MG/ML IJ SOLN
INTRAMUSCULAR | Status: AC
  Filled 2016-01-30: qty 1

## 2016-01-30 MED ORDER — SODIUM CHLORIDE 0.9 % IV SOLN
Freq: Once | INTRAVENOUS | Status: AC
  Administered 2016-01-30: 12:00:00 via INTRAVENOUS

## 2016-01-30 MED ORDER — DIPHENHYDRAMINE HCL 25 MG PO CAPS: 25.0000 mg | ORAL_CAPSULE | Freq: Once | ORAL | Status: DC

## 2016-01-30 MED ORDER — ACETAMINOPHEN 325 MG PO TABS
650.0000 mg | ORAL_TABLET | Freq: Four times a day (QID) | ORAL | Status: DC | PRN
  Administered 2016-01-30: 325 mg via ORAL
  Filled 2016-01-30: qty 2

## 2016-01-30 MED ORDER — IMMUNE GLOBULIN (HUMAN) 20 GM/200ML IV SOLN
50.0000 g | Freq: Once | INTRAVENOUS | Status: AC
  Administered 2016-01-30: 50 g via INTRAVENOUS
  Filled 2016-01-30: qty 100

## 2016-01-30 NOTE — Patient Instructions (Signed)
..  [place] at [facility] Discharge Instructions  RECOMMENDATIONS MADE BY THE CONSULTANT AND ANY TEST RESULTS WILL BE SENT TO YOUR REFERRING PHYSICIAN.  IVIG today We repeated your benadryl IV and gave you some solumedrol IV due to itching and wheezing. Take your benadryl and hydrocodone and one tylenol right before you come to hospital.  Repeat benadryl this weekend if you have shortness of breath or itching, Come to ED if not relieved by this  Thank you for choosing [place] at [facility] to provide your oncology and hematology care.  To afford each patient quality time with our provider, please arrive at least 15 minutes before your scheduled appointment time.   Beginning January 23rd 2017 lab work for the [facility] will be done in the  Main lab at [facility] on 1st floor. If you have a lab appointment with the [facility] please come in thru the  Main Entrance and check in at the main information desk  You need to re-schedule your appointment should you arrive 10 or more minutes late.  We strive to give you quality time with our providers, and arriving late affects you and other patients whose appointments are after yours.  Also, if you no show three or more times for appointments you may be dismissed from the clinic at the providers discretion.     Again, thank you for choosing [facility].  Our hope is that these requests will decrease the amount of time that you wait before being seen by our physicians.       _____________________________________________________________  Should you have questions after your visit to [facility], please contact our office at [phone] between the hours of 8:30 a.m. and 4:30 p.m.  Voicemails left after 4:30 p.m. will not be returned until the following business day.  For prescription refill requests, have your pharmacy contact our office.

## 2016-01-30 NOTE — Progress Notes (Signed)
..  [name] Ball arrived today for IVIG.  1418 after up to bathroom complains that he just "feels different" and has some itching. Hypersensitivity kit obtained and Dr. Whitney Muse summoned. 25 mg benadryl given IV. Wheezing noted, patient alert and talking without acute distress. 40 mg solumedrol IV given. Relieve of symptoms obtained and infusion restarted. Tolerated remainder of infusion without problems. Ambulatory on d/c home.

## 2016-02-06 DIAGNOSIS — J449 Chronic obstructive pulmonary disease, unspecified: Secondary | ICD-10-CM | POA: Diagnosis not present

## 2016-02-09 ENCOUNTER — Encounter: Payer: Self-pay | Admitting: Family Medicine

## 2016-02-09 MED ORDER — HYDROCODONE-ACETAMINOPHEN 10-325 MG PO TABS: 1.0000 | ORAL_TABLET | Freq: Four times a day (QID) | ORAL | Status: DC | PRN

## 2016-02-26 ENCOUNTER — Encounter (HOSPITAL_COMMUNITY): Payer: Medicare Other

## 2016-02-26 ENCOUNTER — Encounter (HOSPITAL_COMMUNITY): Payer: Self-pay | Admitting: Hematology & Oncology

## 2016-02-26 ENCOUNTER — Encounter (HOSPITAL_COMMUNITY): Payer: Medicare Other | Attending: Hematology & Oncology | Admitting: Hematology & Oncology

## 2016-02-26 ENCOUNTER — Encounter (HOSPITAL_BASED_OUTPATIENT_CLINIC_OR_DEPARTMENT_OTHER): Payer: Medicare Other

## 2016-02-26 VITALS — BP 180/79 | HR 76 | Temp 97.4°F | Resp 18 | Wt 268.0 lb

## 2016-02-26 DIAGNOSIS — Z85528 Personal history of other malignant neoplasm of kidney: Secondary | ICD-10-CM | POA: Diagnosis not present

## 2016-02-26 DIAGNOSIS — R062 Wheezing: Secondary | ICD-10-CM

## 2016-02-26 DIAGNOSIS — N183 Chronic kidney disease, stage 3 (moderate): Secondary | ICD-10-CM

## 2016-02-26 DIAGNOSIS — D801 Nonfamilial hypogammaglobulinemia: Secondary | ICD-10-CM

## 2016-02-26 DIAGNOSIS — C9110 Chronic lymphocytic leukemia of B-cell type not having achieved remission: Secondary | ICD-10-CM | POA: Diagnosis not present

## 2016-02-26 LAB — CBC WITH DIFFERENTIAL/PLATELET
BASOS ABS: 0 10*3/uL (ref 0.0–0.1)
BASOS PCT: 0 %
EOS ABS: 0.6 10*3/uL (ref 0.0–0.7)
Eosinophils Relative: 2 %
HCT: 43.8 % (ref 39.0–52.0)
HEMOGLOBIN: 13.6 g/dL (ref 13.0–17.0)
Lymphocytes Relative: 76 %
Lymphs Abs: 22.3 10*3/uL — ABNORMAL HIGH (ref 0.7–4.0)
MCH: 25.4 pg — AB (ref 26.0–34.0)
MCHC: 31.1 g/dL (ref 30.0–36.0)
MCV: 81.9 fL (ref 78.0–100.0)
Monocytes Absolute: 0.9 10*3/uL (ref 0.1–1.0)
Monocytes Relative: 3 %
NEUTROS ABS: 5.5 10*3/uL (ref 1.7–7.7)
NEUTROS PCT: 19 %
PLATELETS: 195 10*3/uL (ref 150–400)
RBC: 5.35 MIL/uL (ref 4.22–5.81)
RDW: 16.2 % — ABNORMAL HIGH (ref 11.5–15.5)
WBC: 29.3 10*3/uL — ABNORMAL HIGH (ref 4.0–10.5)

## 2016-02-26 LAB — COMPREHENSIVE METABOLIC PANEL
ALT: 18 U/L (ref 17–63)
ANION GAP: 6 (ref 5–15)
AST: 21 U/L (ref 15–41)
Albumin: 4.2 g/dL (ref 3.5–5.0)
Alkaline Phosphatase: 36 U/L — ABNORMAL LOW (ref 38–126)
BUN: 18 mg/dL (ref 6–20)
CHLORIDE: 103 mmol/L (ref 101–111)
CO2: 26 mmol/L (ref 22–32)
Calcium: 8.6 mg/dL — ABNORMAL LOW (ref 8.9–10.3)
Creatinine, Ser: 1.64 mg/dL — ABNORMAL HIGH (ref 0.61–1.24)
GFR calc non Af Amer: 42 mL/min — ABNORMAL LOW (ref >60)
GFR, EST AFRICAN AMERICAN: 49 mL/min — AB (ref >60)
Glucose, Bld: 156 mg/dL — ABNORMAL HIGH (ref 65–99)
Potassium: 4.4 mmol/L (ref 3.5–5.1)
SODIUM: 135 mmol/L (ref 135–145)
Total Bilirubin: 0.7 mg/dL (ref 0.3–1.2)
Total Protein: 6.6 g/dL (ref 6.5–8.1)

## 2016-02-26 MED ORDER — METHYLPREDNISOLONE SODIUM SUCC 40 MG IJ SOLR
INTRAMUSCULAR | Status: AC
  Filled 2016-02-26: qty 1

## 2016-02-26 MED ORDER — IMMUNE GLOBULIN (HUMAN) 20 GM/200ML IV SOLN
50.0000 g | Freq: Once | INTRAVENOUS | Status: AC
  Administered 2016-02-26: 50 g via INTRAVENOUS
  Filled 2016-02-26: qty 100

## 2016-02-26 MED ORDER — IMMUNE GLOBULIN (HUMAN) 5 GM/100ML IV SOLN
400.0000 mg/kg | Freq: Once | INTRAVENOUS | Status: DC
Start: 2016-02-26 — End: 2016-02-26

## 2016-02-26 MED ORDER — ALBUTEROL SULFATE (2.5 MG/3ML) 0.083% IN NEBU
INHALATION_SOLUTION | RESPIRATORY_TRACT | Status: AC
  Filled 2016-02-26: qty 3

## 2016-02-26 MED ORDER — ALBUTEROL SULFATE (2.5 MG/3ML) 0.083% IN NEBU
2.5000 mg | INHALATION_SOLUTION | Freq: Once | RESPIRATORY_TRACT | Status: AC
  Administered 2016-02-26: 2.5 mg via RESPIRATORY_TRACT

## 2016-02-26 MED ORDER — DIPHENHYDRAMINE HCL 25 MG PO CAPS
25.0000 mg | ORAL_CAPSULE | Freq: Once | ORAL | Status: AC
  Administered 2016-02-26: 25 mg via ORAL

## 2016-02-26 MED ORDER — METHYLPREDNISOLONE SODIUM SUCC 40 MG IJ SOLR
40.0000 mg | Freq: Once | INTRAMUSCULAR | Status: AC
  Administered 2016-02-26: 40 mg via INTRAVENOUS

## 2016-02-26 MED ORDER — DIPHENHYDRAMINE HCL 25 MG PO CAPS
ORAL_CAPSULE | ORAL | Status: AC
  Filled 2016-02-26: qty 1

## 2016-02-26 MED ORDER — SODIUM CHLORIDE 0.9 % IV SOLN
Freq: Once | INTRAVENOUS | Status: AC
  Administered 2016-02-26: 10:00:00 via INTRAVENOUS

## 2016-02-26 NOTE — Patient Instructions (Addendum)
[place] at [facility] Discharge Instructions  RECOMMENDATIONS MADE BY THE CONSULTANT AND ANY TEST RESULTS WILL BE SENT TO YOUR REFERRING PHYSICIAN.   Exam and discussion by Dr Whitney Muse today WBC 20.0 blood counts are stable Antibody levels are pending  IVIG today IVIG every 4 weeks  Return to see the doctor in 1 month Please call the clinic if you have any questions or concerns    Thank you for choosing [place] at [facility] to provide your oncology and hematology care.  To afford each patient quality time with our provider, please arrive at least 15 minutes before your scheduled appointment time.   Beginning January 23rd 2017 lab work for the [facility] will be done in the  Main lab at [facility] on 1st floor. If you have a lab appointment with the [facility] please come in thru the  Main Entrance and check in at the main information desk  You need to re-schedule your appointment should you arrive 10 or more minutes late.  We strive to give you quality time with our providers, and arriving late affects you and other patients whose appointments are after yours.  Also, if you no show three or more times for appointments you may be dismissed from the clinic at the providers discretion.     Again, thank you for choosing [facility].  Our hope is that these requests will decrease the amount of time that you wait before being seen by our physicians.       _____________________________________________________________  Should you have questions after your visit to [facility], please contact our office at [phone] between the hours of 8:30 a.m. and 4:30 p.m.  Voicemails left after 4:30 p.m. will not be returned until the following business day.  For prescription refill requests, have your pharmacy contact our office.         Resources For Cancer Patients and their Caregivers ? American Cancer  Society: Can assist with transportation, wigs, general needs, runs Look Good Feel Better.        309 216 3141 ? Cancer Care: Provides financial assistance, online support groups, medication/co-pay assistance.  1-800-813-HOPE (228)240-7079) ? [facility] Assists [place] cancer patients and their families through emotional , educational and financial support.  859-661-2230 ? [place] DSS Where to apply for food stamps, Medicaid and utility assistance. 619-822-2529 ? [facility]: Transportation to medical appointments. (959)779-4909 ? Social Security Administration: May apply for disability if have a Stage IV cancer. 825-089-6925 724-512-9884 ? [employer], Disability and Transit Services: Assists with nutrition, care and transit needs. [phone]

## 2016-02-26 NOTE — Progress Notes (Signed)
..  [name] Ball arrive today for IVIG. Patient took his hydrodocone with one tylenol and 25 mg benadryl. Solumedrol 40 mg given IV prior to IVIG due to reaction with last infusion. At 1100 patient Up to Bathroom, no BM but stomach upset after returned to recliner he   c/o headache, wheezing, and upset stomach. IVIG stopped and vitals obtained and hypertension again noted, this was present on arrival. . T. Kefalas PA_C in and respiratory called for breathing treatment. Relief of respiratory symptoms and head ache down to 5 within 10 minutes. IVIG restarted at 1130.  1219 minimal headache, benadryl repeated, no other complaints He tolerated remainder of infusion without problems. Discharged ambulatory with wife

## 2016-02-26 NOTE — Progress Notes (Signed)
[facility] Note  Patient Care Team: [name] Rossetti, MD as PCP - General (Family Medicine) [name] Records, MD as Consulting Physician (Cardiology)  CHIEF COMPLAINTS:  CLL Flow cytometry performed on 11/07/15 by [name] Ball shows findings consistent with CLL/SLL CBC from 11/04/15 shows a total WBC of 25,000. Normal hemoglobin, HCT, PLT Stage III chronic kidney disease [name] Ball Recurrent sinusitis, IVIG started 01/2016  HISTORY OF PRESENTING ILLNESS:  [name] Ball 65 y.o. male is here for follow-up of CLL.  [name] Ball is accompanied by his wife. [name] Ball is mostly deaf so his wife signed to him most of our visit to help him understand everything. He was receiving treatment today and was in a treatment chair.  He has been feeling better. He has COPD. She said that his close family recently had an "awful intestinal thing" but he did not feel too bad.   He has started to cook and has helped outside a little bit. He has noticed an improvement in his energy.  The night after his last treatment he had no nausea or vomiting. His wife said that he went home and went to sleep.   He said that after he takes his medicine it makes his stomach "crazy" for about an hour or so. This is not new. He denies night sweats, fever or chills.   MEDICAL HISTORY:  Past Medical History  Diagnosis Date  . Allergy   . COPD (chronic obstructive pulmonary disease) ([place])   . Hypertension   . Thyroid disease     hypothryoid  . Migraine   . Ulcer   . Diverticulitis   . Solar purpura ([place])   . Asthma   . Low testosterone   . GERD (gastroesophageal reflux disease)   . Arthritis   . Ventral hernia FEB 2015 CT     Large infra umbilical ventral hernia containing loops of small  . Diverticulosis     colonoscopy 3/15  . Cancer ([place])     kidney  . CLL (chronic lymphocytic leukemia) ([place]) 2016    SURGICAL HISTORY: Past Surgical History  Procedure  Laterality Date  . Kidney cyst removal  jan 2005    renal cell carcinoma  . Abdominal mesh inserted  june 2006, may 2008,dec 2010  . Knee minescus repair Left feb 2007  . Cholecystectomy  june 2007  . Colon surgery  sept 2007  . Colonoscopy    . Hernia repair  05/2005 5/20108, 11/2009    with mesh  . Colonoscopy N/A 02/20/2014    Procedure: COLONOSCOPY;  Surgeon: Danie Binder, MD;  Location: [facility];  Service: Endoscopy;  Laterality: N/A;  . Nephrectomy      SOCIAL HISTORY: Social History   Social History  . Marital Status: Married    Spouse Name: N/A  . Number of Children: N/A  . Years of Education: N/A   Occupational History  . Not on file.   Social History Main Topics  . Smoking status: Former Smoker    Quit date: 12/06/1996  . Smokeless tobacco: Never Used  . Alcohol Use: Yes     Comment: a few shots a few times a week  . Drug Use: No  . Sexual Activity: Not on file   Other Topics Concern  . Not on file   Social History Narrative   PT IS A [name] Ball & DOES NOT WANT BLOOD PRODUCTS.   He says he's been married  45 years, almost 53. He has 4 children and 6 grandchildren. No great-grandchildren yet.  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 He also cooks. He also worked in Public relations account executive.  He used to smoke. He quit about 17 years ago. He says he has a shot of whisky one or two nights a week, so he doesn't have problems with alcohol. He may, however, have a problem with soda -- S[employer].  In terms of his hobbies, he jokes that he "likes to do nothing." He cooks. He says he's the "second best cook on this side of the [place], but I'm not bragging." [place] food is his specialty, he says. He says he makes an excellent leg of lamb, and a great Moussaka. His wife makes a good Baklava. He also has a good [place] chicken recipe.   Why [place] food? When he was younger, he cooked and worked in [place], [place] with a family that was all [place].  They had a [place] festival coming up. So grandma came in and they cooked for the [place] -- maybe 2500 people.  She showed him each recipe and she was really tough. He says it really stuck with him ever since. He says when he gets his recipes, he gets them from families; family recipes. He says he's got about 3,000 of them. He says he doesn't give his recipes to anybody, but he'll make them. He says "I make the best pizza in the [place]." He says he makes a good soup, too. It's clear that he loves to cook very much.  FAMILY HISTORY: Family History  Problem Relation Age of Onset  . Cancer Mother     lung  . Colon cancer Neg Hx    indicated that his mother is deceased. He indicated that his father is deceased.    His biological father died of a stroke in bed; his mother died of brain cancer, metastasized breast cancer. They both died at 46. He says jokingly, "That's making me a little apprehensive, but that's okay." His mother died 9 years ago. He has a half brother and a half sister. His sister had juvenile diabetes; his brother's fine. No family history of CLL on his side that he knows of. They are familiar with CLL in general thanks to his wife's mother having it.  ALLERGIES:  is allergic to contrast media; other; strawberry extract; and ether.  MEDICATIONS:  Current Outpatient Prescriptions  Medication Sig Dispense Refill  . acetaminophen (TYLENOL) 325 MG tablet Take 650 mg by mouth every 6 (six) hours as needed.    Marland Kitchen albuterol (PROVENTIL) (2.5 MG/3ML) 0.083% nebulizer solution TAKE 3MLS(1 VIAL) VIA NEBULIZER EVERY 6 HOURS AS NEEDED FOR WHEEZING OR SHORTNESS OF BREATH 150 mL 0  . allopurinol (ZYLOPRIM) 100 MG tablet Take 1 tablet (100 mg total) by mouth at bedtime. 90 tablet 1  . amLODipine (NORVASC) 5 MG tablet Take 1 tablet (5 mg total) by mouth daily. 90  tablet 3  . ATROVENT HFA 17 MCG/ACT inhaler INHALE 2 PUFFS BY MOUTH INTO THE LUNGS EVERY 6 HOURS 12.9 g 11  . calcium carbonate (TUMS - DOSED IN MG ELEMENTAL CALCIUM) 500 MG chewable tablet Chew 2-3 tablets by mouth once as needed for indigestion or heartburn. Reported on 11/21/2015    . cholecalciferol (VITAMIN D) 1000 UNITS tablet Take 1,000  Units by mouth every morning.     . citalopram (CELEXA) 10 MG tablet Take 1 tablet (10 mg total) by mouth daily. 90 tablet 2  . dexlansoprazole (DEXILANT) 60 MG capsule Take 1 capsule (60 mg total) by mouth daily. 180 capsule 1  . diphenhydrAMINE (BENADRYL) 25 MG tablet Take 25 mg by mouth every evening.     . eletriptan (RELPAX) 20 MG tablet Take 1 tablet (20 mg total) by mouth as needed for migraine. One tablet by mouth at onset of headache. May repeat in 2 hours if headache persists or recurs. 10 tablet 3  . Glucosamine-Chondroit-Vit C-Mn (GLUCOSAMINE 1500 COMPLEX) CAPS Take 1,200 capsules by mouth 2 (two) times daily.    Marland Kitchen HYDROcodone-acetaminophen (NORCO) 10-325 MG tablet Take 1 tablet by mouth every 6 (six) hours as needed. 120 tablet 0  . levalbuterol (XOPENEX HFA) 45 MCG/ACT inhaler Inhale 1-2 puffs into the lungs every 4 (four) hours as needed for wheezing. 1 Inhaler 6  . levothyroxine (SYNTHROID, LEVOTHROID) 50 MCG tablet Take 1 tablet (50 mcg total) by mouth daily before breakfast. 90 tablet 3  . loratadine (CLARITIN) 10 MG tablet Take 10 mg by mouth every morning.     . metoprolol (LOPRESSOR) 50 MG tablet Take 1 tablet (50 mg total) by mouth 2 (two) times daily. 180 tablet 1  . mometasone (ASMANEX 60 METERED DOSES) 220 MCG/INH inhaler Inhale 2 puffs into the lungs 2 (two) times daily. 1 Inhaler 11  . testosterone cypionate (DEPOTESTOSTERONE CYPIONATE) 200 MG/ML injection INJECT 1.5ML INTO THE MUSCLE EVERY 14 DAYS 10 mL 0  . vitamin B-12 (CYANOCOBALAMIN) 1000 MCG tablet Take 1,000 mcg by mouth every morning.     . vitamin E 400 UNIT capsule Take 400  Units by mouth every morning.      No current facility-administered medications for this visit.    Review of Systems  Constitutional: Negative for fever, chills, weight loss and malaise/fatigue.  HENT: Positive for hearing loss. Negative for congestion, nosebleeds, sore throat and tinnitus.   Eyes: Negative.  Negative for blurred vision, double vision, pain and discharge.  Respiratory: Negative.  Negative for cough, hemoptysis, sputum production, shortness of breath and wheezing.        Has COPD  Cardiovascular: Negative.  Negative for chest pain, palpitations, claudication, leg swelling and PND.  Gastrointestinal: Negative.  Negative for heartburn, nausea, vomiting, abdominal pain, diarrhea, constipation, blood in stool and melena.  Genitourinary: Negative.  Negative for dysuria, urgency, frequency and hematuria.  Musculoskeletal: Negative.  Negative for myalgias, joint pain and falls.  Skin: Negative.  Negative for itching and rash.  Neurological: Negative.  Negative for dizziness, tingling, tremors, sensory change, speech change, focal weakness, seizures, loss of consciousness, weakness and headaches.  Endo/Heme/Allergies: Negative.  Does not bruise/bleed easily.  Psychiatric/Behavioral: Negative.  Negative for depression, suicidal ideas, memory loss and substance abuse. The patient is not nervous/anxious and does not have insomnia.   All other systems reviewed and are negative.  14 point ROS was done and is otherwise as detailed above or in HPI   PHYSICAL EXAMINATION: ECOG PERFORMANCE STATUS: 1 - Symptomatic but completely ambulatory Vitals with BMI 02/26/2016  Height   Weight 268 lbs  BMI   Systolic [id number]  Diastolic 93  Pulse 79  Respirations 22   Physical Exam  Constitutional: He is oriented to person, place, and time and well-developed, well-nourished, and in no distress.  HENT:  Head: Normocephalic and atraumatic.  Nose: Nose normal.  Mouth/Throat: Oropharynx  is clear and  moist. No oropharyngeal exudate.  Eyes: Conjunctivae and EOM are normal. Pupils are equal, round, and reactive to light. Right eye exhibits no discharge. Left eye exhibits no discharge. No scleral icterus.  Neck: Normal range of motion. Neck supple. No tracheal deviation present. No thyromegaly present.  Cardiovascular: Normal rate, regular rhythm and normal heart sounds.  Exam reveals no gallop and no friction rub.   No murmur heard. Pulmonary/Chest: Effort normal and breath sounds normal. He has no wheezes. He has no rales.  Abdominal: Soft. Bowel sounds are normal. He exhibits no distension and no mass. There is no tenderness. There is no rebound and no guarding.  Musculoskeletal: Normal range of motion. He exhibits no edema.  Lymphadenopathy:    He has no cervical adenopathy.  Neurological: He is alert and oriented to person, place, and time. He has normal reflexes. No cranial nerve deficit. Gait normal. Coordination normal.  Skin: Skin is warm and dry. No rash noted.  Psychiatric: Mood, memory, affect and judgment normal.  Nursing note and vitals reviewed.  LABORATORY DATA:  I have reviewed the data as listed Lab Results  Component Value Date   WBC 29.3* 02/26/2016   HGB 13.6 02/26/2016   HCT 43.8 02/26/2016   MCV 81.9 02/26/2016   PLT 195 02/26/2016   CMP     Component Value Date/Time   NA 135 02/26/2016 0900   NA 138 01/31/2013   K 4.4 02/26/2016 0900   CL 103 02/26/2016 0900   CO2 26 02/26/2016 0900   GLUCOSE 156* 02/26/2016 0900   BUN 18 02/26/2016 0900   BUN 19 01/31/2013   CREATININE 1.64* 02/26/2016 0900   CREATININE 1.58* 11/07/2015 1621   CREATININE 1.6* 01/31/2013   CALCIUM 8.6* 02/26/2016 0900   PROT 6.6 02/26/2016 0900   ALBUMIN 4.2 02/26/2016 0900   AST 21 02/26/2016 0900   ALT 18 02/26/2016 0900   ALKPHOS 36* 02/26/2016 0900   BILITOT 0.7 02/26/2016 0900   GFRNONAA 42* 02/26/2016 0900   GFRNONAA 46* 04/03/2015 1550   GFRAA 49* 02/26/2016 0900    GFRAA 53* 04/03/2015 1550   Results for LILA, HRABE (MRN [id number])   Ref. Range 11/21/2015 16:35  IgG (Immunoglobin G), Serum Latest Ref Range: [phone] mg/dL 517 (L)  IgA Latest Ref Range: 61-437 mg/dL 60 (L)  IgM, Serum Latest Ref Range: 20-172 mg/dL 5 (L)     RADIOLOGY: I have personally reviewed the radiological images as listed and agreed with the findings in the report.  CLINICAL DATA: Right-sided lymphadenopathy. Palpable knot on the right neck. Recent diagnosis of lymphocytic leukemia.  EXAM: CT NECK WITHOUT CONTRAST  TECHNIQUE: Multidetector CT imaging of the neck was performed following the standard protocol without intravenous contrast.  COMPARISON: None.  FINDINGS: Pharynx and larynx: No focal mucosal or submucosal lesions are present. Vocal cords are midline and symmetric. There is mild prominence of the adenoid tissue. Fullness is noted in the lingual tonsils. No discrete lesion is evident.  Salivary glands: Intra parotid lymph nodes are present bilaterally. The parotid and submandibular glands are otherwise normal.  Thyroid: Negative  Lymph nodes: Multiple bilateral ovoid lymph nodes are present in both sides the neck. These are not pathologically enlarged but the number is significantly increased over expectation.  Vascular: Atherosclerotic changes are present at the aorta and branch vessels. Minimal calcifications are present at the carotid bifurcations bilaterally without significant stenosis.  Limited intracranial: Unremarkable.  Visualized orbits: Within normal limits  Mastoids and  visualized paranasal sinuses: The paranasal sinuses are clear. A left mastoid and middle ear effusion is present. No obstructing nasopharyngeal lesion is present.  Skeleton: Bone windows demonstrate reversal of the normal cervical lordosis. There is chronic loss of disc height at C4-5 and C5-6. Uncovertebral spurring is most noted at these 2  levels. The patient is edentulous. No focal lytic or blastic lesions are present.  Upper chest: A 12 x 11 mm right upper lobe pulmonary nodule is present. This is well-defined without spiculation. No other focal nodule, mass, or airspace disease is present. Sub cm right peritracheal lymph nodes are noted.  IMPRESSION: 1. Multiple sub cm ovoid lymph nodes within the neck bilaterally. Although no focally enlarged nodes are present, these could be related to the recent diagnosis of leukemia. 2. No focal mass lesion subjacent to the area marked. This is near the right submandibular gland. 3. Advanced spondylosis in the cervical spine. 4. Atherosclerosis.   Electronically Signed  By: San Morelle M.D.  On: 01/02/2016 16:49   ASSESSMENT & PLAN:  CLL Hypogammaglobulenemia Normal hemoglobin, HCT, PLT count History of RCC status post S/P nephrectomy, no medical Ball available CKD, Stage III    We discussed IVIG and its role in hypogammaglobulinemia from CLL and recurrent infections.  When given in the setting of hypogammaglobulinemia, antibody deficiency disorders, and/or other immunodeficiency states, IVIG acts by providing adequate concentrations of antibodies against a broad range of pathogens. I advised the patient that he would qualify for IVIG therapy given his low antibody levels, his ongoing difficulties with recurrent sinusitis. I suspect his fatigue may be improved somewhat over time as well. I have recommended trying IVIG therapy.   He is tolerating IVIG therapy. He feels he has already noted an improvement. Nadir check was done today.  He will return in one month to review labs and for ongoing observation.   Orders Placed This Encounter  Procedures  . CBC with Differential    Standing Status: Future     Number of Occurrences:      Standing Expiration Date: 02/25/2017  . Comprehensive metabolic panel    Standing Status: Future     Number of Occurrences:        Standing Expiration Date: 02/25/2017  . IgG, IgA, IgM    Standing Status: Future     Number of Occurrences:      Standing Expiration Date: 02/25/2017    All questions were answered. The patient knows to call the clinic with any problems, questions or concerns.  This document serves as a record of services personally performed by Ancil Linsey, MD. It was created on her behalf by Kandace Blitz, a trained medical scribe. The creation of this record is based on the scribe's personal observations and the provider's statements to them. This document has been checked and approved by the attending provider.  I have reviewed the above documentation for accuracy and completeness, and I agree with the above.  This note was electronically signed.    Molli Hazard, MD

## 2016-02-27 LAB — IGG, IGA, IGM
IgA: 63 mg/dL (ref 61–437)
IgG (Immunoglobin G), Serum: 714 mg/dL (ref 700–1600)
IgM, Serum: <5 mg/dL — ABNORMAL LOW (ref 20–172)

## 2016-03-08 DIAGNOSIS — J449 Chronic obstructive pulmonary disease, unspecified: Secondary | ICD-10-CM | POA: Diagnosis not present

## 2016-03-09 ENCOUNTER — Telehealth (HOSPITAL_COMMUNITY): Payer: Self-pay | Admitting: Hematology & Oncology

## 2016-03-09 NOTE — Telephone Encounter (Signed)
Pt's wife came to speak with me about $760 bal left over from IVIG tx. I advised her that there is no copay assistance programs for IVIG and she could talk to dr Whitney Muse about other tx options or set up Payment arragements.

## 2016-03-11 ENCOUNTER — Encounter: Payer: Self-pay | Admitting: Family Medicine

## 2016-03-12 ENCOUNTER — Other Ambulatory Visit: Payer: Self-pay | Admitting: Family Medicine

## 2016-03-12 ENCOUNTER — Encounter: Payer: Self-pay | Admitting: Family Medicine

## 2016-03-15 MED ORDER — HYDROCODONE-ACETAMINOPHEN 10-325 MG PO TABS: 1.0000 | ORAL_TABLET | Freq: Four times a day (QID) | ORAL | Status: DC | PRN

## 2016-03-22 ENCOUNTER — Other Ambulatory Visit: Payer: Self-pay | Admitting: Family Medicine

## 2016-03-22 NOTE — Telephone Encounter (Signed)
Refill appropriate and filled per protocol. 

## 2016-03-23 DIAGNOSIS — G4733 Obstructive sleep apnea (adult) (pediatric): Secondary | ICD-10-CM | POA: Diagnosis not present

## 2016-03-25 ENCOUNTER — Ambulatory Visit (HOSPITAL_COMMUNITY): Payer: 59 | Admitting: Hematology & Oncology

## 2016-03-25 ENCOUNTER — Ambulatory Visit (HOSPITAL_COMMUNITY): Payer: 59

## 2016-03-31 ENCOUNTER — Encounter (HOSPITAL_COMMUNITY): Payer: Self-pay | Admitting: *Deleted

## 2016-03-31 ENCOUNTER — Telehealth (HOSPITAL_COMMUNITY): Payer: Self-pay | Admitting: *Deleted

## 2016-03-31 ENCOUNTER — Emergency Department (HOSPITAL_COMMUNITY)
Admission: EM | Admit: 2016-03-31 | Discharge: 2016-03-31 | Disposition: A | Discharge status: Home / Self Care | Payer: Medicare Other | Attending: Emergency Medicine | Admitting: Emergency Medicine

## 2016-03-31 DIAGNOSIS — Z87891 Personal history of nicotine dependence: Secondary | ICD-10-CM | POA: Insufficient documentation

## 2016-03-31 DIAGNOSIS — I10 Essential (primary) hypertension: Secondary | ICD-10-CM | POA: Diagnosis not present

## 2016-03-31 DIAGNOSIS — B029 Zoster without complications: Secondary | ICD-10-CM

## 2016-03-31 DIAGNOSIS — R21 Rash and other nonspecific skin eruption: Secondary | ICD-10-CM | POA: Diagnosis present

## 2016-03-31 DIAGNOSIS — M1990 Unspecified osteoarthritis, unspecified site: Secondary | ICD-10-CM | POA: Insufficient documentation

## 2016-03-31 DIAGNOSIS — J449 Chronic obstructive pulmonary disease, unspecified: Secondary | ICD-10-CM | POA: Insufficient documentation

## 2016-03-31 DIAGNOSIS — J45909 Unspecified asthma, uncomplicated: Secondary | ICD-10-CM | POA: Insufficient documentation

## 2016-03-31 HISTORY — DX: Zoster without complications: B02.9

## 2016-03-31 MED ORDER — PREDNISONE 20 MG PO TABS: 40.0000 mg | ORAL_TABLET | Freq: Every day | ORAL | Status: DC

## 2016-03-31 MED ORDER — HYDROCODONE-ACETAMINOPHEN 10-325 MG PO TABS: 1.0000 | ORAL_TABLET | Freq: Three times a day (TID) | ORAL | Status: DC | PRN

## 2016-03-31 MED ORDER — HYDROMORPHONE HCL 1 MG/ML IJ SOLN
1.0000 mg | Freq: Once | INTRAMUSCULAR | Status: AC
  Administered 2016-03-31: 1 mg via INTRAMUSCULAR
  Filled 2016-03-31: qty 1

## 2016-03-31 MED ORDER — ACYCLOVIR 800 MG PO TABS: 800.0000 mg | ORAL_TABLET | Freq: Every day | ORAL | Status: DC

## 2016-03-31 NOTE — Discharge Instructions (Signed)
Shingles °Shingles, which is also known as herpes zoster, is an infection that causes a painful skin rash and fluid-filled blisters. Shingles is not related to genital herpes, which is a sexually transmitted infection.  ° °Shingles only develops in people who: °· Have had chickenpox. °· Have received the chickenpox vaccine. (This is rare.) °CAUSES °Shingles is caused by varicella-zoster virus (VZV). This is the same virus that causes chickenpox. After exposure to VZV, the virus stays in the body in an inactive (dormant) state. Shingles develops if the virus reactivates. This can happen many years after the initial exposure to VZV. It is not known what causes this virus to reactivate. °RISK FACTORS °People who have had chickenpox or received the chickenpox vaccine are at risk for shingles. Infection is more common in people who: °· Are older than age 50. °· Have a weakened defense (immune) system, such as those with HIV, AIDS, or cancer. °· Are taking medicines that weaken the immune system, such as transplant medicines. °· Are under great stress. °SYMPTOMS °Early symptoms of this condition include itching, tingling, and pain in an area on your skin. Pain may be described as burning, stabbing, or throbbing. °A few days or weeks after symptoms start, a painful red rash appears, usually on one side of the body in a bandlike or beltlike pattern. The rash eventually turns into fluid-filled blisters that break open, scab over, and dry up in about 2-3 weeks. °At any time during the infection, you may also develop: °· A fever. °· Chills. °· A headache. °· An upset stomach. °DIAGNOSIS °This condition is diagnosed with a skin exam. Sometimes, skin or fluid samples are taken from the blisters before a diagnosis is made. These samples are examined under a microscope or sent to a lab for testing. °TREATMENT °There is no specific cure for this condition. Your health care provider will probably prescribe medicines to help you  manage pain, recover more quickly, and avoid long-term problems. Medicines may include: °· Antiviral drugs. °· Anti-inflammatory drugs. °· Pain medicines. °If the area involved is on your face, you may be referred to a specialist, such as an eye doctor (ophthalmologist) or an ear, nose, and throat (ENT) doctor to help you avoid eye problems, chronic pain, or disability. °HOME CARE INSTRUCTIONS °Medicines °· Take medicines only as directed by your health care provider. °· Apply an anti-itch or numbing cream to the affected area as directed by your health care provider. °Blister and Rash Care °· Take a cool bath or apply cool compresses to the area of the rash or blisters as directed by your health care provider. This may help with pain and itching. °· Keep your rash covered with a loose bandage (dressing). Wear loose-fitting clothing to help ease the pain of material rubbing against the rash. °· Keep your rash and blisters clean with mild soap and cool water or as directed by your health care provider. °· Check your rash every day for signs of infection. These include redness, swelling, and pain that lasts or increases. °· Do not pick your blisters. °· Do not scratch your rash. °General Instructions °· Rest as directed by your health care provider. °· Keep all follow-up visits as directed by your health care provider. This is important. °· Until your blisters scab over, your infection can cause chickenpox in people who have never had it or been vaccinated against it. To prevent this from happening, avoid contact with other people, especially: °· Babies. °· Pregnant women. °· Children   who have eczema. °· Elderly people who have transplants. °· People who have chronic illnesses, such as leukemia or AIDS. °SEEK MEDICAL CARE IF: °· Your pain is not relieved with prescribed medicines. °· Your pain does not get better after the rash heals. °· Your rash looks infected. Signs of infection include redness, swelling, and pain  that lasts or increases. °SEEK IMMEDIATE MEDICAL CARE IF: °· The rash is on your face or nose. °· You have facial pain, pain around your eye area, or loss of feeling on one side of your face. °· You have ear pain or you have ringing in your ear. °· You have loss of taste. °· Your condition gets worse. °  °This information is not intended to replace advice given to you by your health care provider. Make sure you discuss any questions you have with your health care provider. °  °Document Released: 11/22/2005 Document Revised: 12/13/2014 Document Reviewed: 10/03/2014 °Elsevier Interactive Patient Education ©2016 [employer] ° °Neuropathic Pain °Neuropathic pain is pain caused by damage to the nerves that are responsible for certain sensations in your body (sensory nerves). The pain can be caused by damage to:  °· The sensory nerves that send signals to your spinal cord and brain (peripheral nervous system). °· The sensory nerves in your brain or spinal cord (central nervous system). °Neuropathic pain can make you more sensitive to pain. What would be a minor sensation for most people may feel very painful if you have neuropathic pain. This is usually a long-term condition that can be difficult to treat. The type of pain can differ from person to person. It may start suddenly (acute), or it may develop slowly and last for a long time (chronic). Neuropathic pain may come and go as damaged nerves heal or may stay at the same level for years. It often causes emotional distress, loss of sleep, and a lower quality of life. °CAUSES  °The most common cause of damage to a sensory nerve is diabetes. Many other diseases and conditions can also cause neuropathic pain. Causes of neuropathic pain can be classified as: °· Toxic. Many drugs and chemicals can cause toxic damage. The most common cause of toxic neuropathic pain is damage from drug treatment for cancer (chemotherapy). °· Metabolic. This type of pain can happen when a  disease causes imbalances that damage nerves. Diabetes is the most common of these diseases. Vitamin B deficiency caused by long-term alcohol abuse is another common cause. °· Traumatic. Any injury that cuts, crushes, or stretches a nerve can cause damage and pain. A common example is feeling pain after losing an arm or leg (phantom limb pain). °· Compression-related. If a sensory nerve gets trapped or compressed for a long period of time, the blood supply to the nerve can be cut off. °· Vascular. Many blood vessel diseases can cause neuropathic pain by decreasing blood supply and oxygen to nerves. °· Autoimmune. This type of pain results from diseases in which the body's defense system mistakenly attacks sensory nerves. Examples of autoimmune diseases that can cause neuropathic pain include lupus and multiple sclerosis. °· Infectious. Many types of viral infections can damage sensory nerves and cause pain. Shingles infection is a common cause of this type of pain. °· Inherited. Neuropathic pain can be a symptom of many diseases that are passed down through families (genetic). °SIGNS AND SYMPTOMS  °The main symptom is pain. Neuropathic pain is often described as: °· Burning. °· Shock-like. °· Stinging. °· Hot or cold. °·   Itching. °DIAGNOSIS  °No single test can diagnose neuropathic pain. Your health care provider will do a physical exam and ask you about your pain. You may use a pain scale to describe how bad your pain is. You may also have tests to see if you have a high sensitivity to pain and to help find the cause and location of any sensory nerve damage. These tests may include: °· Imaging studies, such as: °¨ X-rays. °¨ CT scan. °¨ MRI. °· Nerve conduction studies to test how well nerve signals travel through your sensory nerves (electrodiagnostic testing). °· Stimulating your sensory nerves through electrodes on your skin and measuring the response in your spinal cord and brain (somatosensory evoked  potentials). °TREATMENT  °Treatment for neuropathic pain may change over time. You may need to try different treatment options or a combination of treatments. Some options include: °· Over-the-counter pain relievers. °· Prescription medicines. Some medicines used to treat other conditions may also help neuropathic pain. These include medicines to: °¨ Control seizures (anticonvulsants). °¨ Relieve depression (antidepressants). °· Prescription-strength pain relievers (narcotics). These are usually used when other pain relievers do not help. °· Transcutaneous nerve stimulation (TENS). This uses electrical currents to block painful nerve signals. The treatment is painless. °· Topical and local anesthetics. These are medicines that numb the nerves. They can be injected as a nerve block or applied to the skin. °· Alternative treatments, such as: °¨ Acupuncture. °¨ Meditation. °¨ Massage. °¨ Physical therapy. °¨ Pain management programs. °¨ Counseling. °HOME CARE INSTRUCTIONS °· Learn as much as you can about your condition. °· Take medicines only as directed by your health care provider. °· Work closely with all your health care providers to find what works best for you. °· Have a good support system at home. °· Consider joining a chronic pain support group. °SEEK MEDICAL CARE IF: °· Your pain treatments are not helping. °· You are having side effects from your medicines. °· You are struggling with fatigue, mood changes, depression, or anxiety. °  °This information is not intended to replace advice given to you by your health care provider. Make sure you discuss any questions you have with your health care provider. °  °Document Released: 08/19/2004 Document Revised: 12/13/2014 Document Reviewed: 05/02/2014 °Elsevier Interactive Patient Education ©2016 [employer] ° °

## 2016-03-31 NOTE — Telephone Encounter (Signed)
Patient has shingles. He is on prednisone and zovirax. They are leaving going out of town next week. They requested to r/s MD visit til the end of May.  They are not going to do further IVIG due to cost. Lab and MD visit r/s for 05/04/16 at patient's request.

## 2016-03-31 NOTE — ED Notes (Signed)
Pt. C/o painful rash from upper right chest that wraps around to upper right back. Pt. Has CLL, and is a patient of Dr. Whitney Muse.

## 2016-04-01 ENCOUNTER — Other Ambulatory Visit (HOSPITAL_COMMUNITY): Payer: Medicare Other

## 2016-04-01 ENCOUNTER — Ambulatory Visit (HOSPITAL_COMMUNITY): Payer: Medicare Other

## 2016-04-01 ENCOUNTER — Ambulatory Visit (HOSPITAL_COMMUNITY): Payer: Medicare Other | Admitting: Hematology & Oncology

## 2016-04-07 NOTE — ED Provider Notes (Signed)
CSN: [id number]     Arrival date & time 03/31/16  1325 History   First MD Initiated Contact with Patient 03/31/16 1339     Chief Complaint  Patient presents with  . Rash     (Consider location/radiation/quality/duration/timing/severity/associated sxs/prior Treatment) HPI   64yM with pain to R chest/back. Onset 2d ago. Worsening since. Today with red rash. Initially itching now more burning and intense pain at times. Worse when clothes touching this area.no fever or chills. No new exposures. No intervention prior to arrival.   Past Medical History  Diagnosis Date  . Allergy   . COPD (chronic obstructive pulmonary disease) ([place])   . Hypertension   . Thyroid disease     hypothryoid  . Migraine   . Ulcer   . Diverticulitis   . Solar purpura ([place])   . Asthma   . Low testosterone   . GERD (gastroesophageal reflux disease)   . Arthritis   . Ventral hernia FEB 2015 CT     Large infra umbilical ventral hernia containing loops of small  . Diverticulosis     colonoscopy 3/15  . Cancer ([place])     kidney  . CLL (chronic lymphocytic leukemia) ([place]) 2016   Past Surgical History  Procedure Laterality Date  . Kidney cyst removal  jan 2005    renal cell carcinoma  . Abdominal mesh inserted  june 2006, may 2008,dec 2010  . Knee minescus repair Left feb 2007  . Cholecystectomy  june 2007  . Colon surgery  sept 2007  . Colonoscopy    . Hernia repair  05/2005 5/20108, 11/2009    with mesh  . Colonoscopy N/A 02/20/2014    Procedure: COLONOSCOPY;  Surgeon: Danie Binder, MD;  Location: [facility];  Service: Endoscopy;  Laterality: N/A;  . Nephrectomy     Family History  Problem Relation Age of Onset  . Cancer Mother     lung  . Colon cancer Neg Hx    Social History  Substance Use Topics  . Smoking status: Former Smoker    Quit date: 12/06/1996  . Smokeless tobacco: Never Used  . Alcohol Use: Yes     Comment: a few shots a few times a week    Review of Systems  All  systems reviewed and negative, other than as noted in HPI.   Allergies  Contrast media; Other; Strawberry extract; and Ether  Home Medications   Prior to Admission medications   Medication Sig Start Date End Date Taking? Authorizing Provider  acetaminophen (TYLENOL) 325 MG tablet Take 650 mg by mouth every 6 (six) hours as needed.    Historical Provider, MD  acyclovir (ZOVIRAX) 800 MG tablet Take 1 tablet (800 mg total) by mouth 5 (five) times daily. 03/31/16   Virgel Manifold, MD  albuterol (PROVENTIL) (2.5 MG/3ML) 0.083% nebulizer solution TAKE 3MLS(1 VIAL) VIA NEBULIZER EVERY 6 HOURS AS NEEDED FOR WHEEZING OR SHORTNESS OF BREATH 11/25/15   Alycia Rossetti, MD  allopurinol (ZYLOPRIM) 100 MG tablet Take 1 tablet (100 mg total) by mouth at bedtime. 11/09/15   Alycia Rossetti, MD  amLODipine (NORVASC) 5 MG tablet Take 1 tablet (5 mg total) by mouth daily. 11/09/15   Alycia Rossetti, MD  amLODipine (NORVASC) 5 MG tablet TAKE 1 TABLET(5 MG) BY MOUTH DAILY 03/22/16   Alycia Rossetti, MD  ATROVENT [facility] 17 MCG/ACT inhaler INHALE 2 PUFFS BY MOUTH INTO THE LUNGS EVERY 6 HOURS 10/27/15   Alycia Rossetti, MD  calcium carbonate (TUMS - DOSED IN MG ELEMENTAL CALCIUM) 500 MG chewable tablet Chew 2-3 tablets by mouth once as needed for indigestion or heartburn. Reported on 11/21/2015    Historical Provider, MD  cholecalciferol (VITAMIN D) 1000 UNITS tablet Take 1,000 Units by mouth every morning.     Historical Provider, MD  citalopram (CELEXA) 10 MG tablet Take 1 tablet (10 mg total) by mouth daily. 11/09/15   Alycia Rossetti, MD  dexlansoprazole (DEXILANT) 60 MG capsule Take 1 capsule (60 mg total) by mouth daily. 11/07/15   Alycia Rossetti, MD  diphenhydrAMINE (BENADRYL) 25 MG tablet Take 25 mg by mouth every evening.     Historical Provider, MD  eletriptan (RELPAX) 20 MG tablet Take 1 tablet (20 mg total) by mouth as needed for migraine. One tablet by mouth at onset of headache. May repeat in 2 hours if  headache persists or recurs. 01/16/14   Alycia Rossetti, MD  Glucosamine-Chondroit-Vit C-Mn (GLUCOSAMINE 1500 COMPLEX) CAPS Take 1,200 capsules by mouth 2 (two) times daily.    Historical Provider, MD  HYDROcodone-acetaminophen (NORCO) 10-325 MG tablet Take 1-2 tablets by mouth every 8 (eight) hours as needed. 03/31/16   Virgel Manifold, MD  levalbuterol [facility] HFA) 45 MCG/ACT inhaler Inhale 1-2 puffs into the lungs every 4 (four) hours as needed for wheezing. 07/22/14   Alycia Rossetti, MD  levothyroxine (SYNTHROID, LEVOTHROID) 50 MCG tablet Take 1 tablet (50 mcg total) by mouth daily before breakfast. 10/27/15   Alycia Rossetti, MD  loratadine (CLARITIN) 10 MG tablet Take 10 mg by mouth every morning.     Historical Provider, MD  metoprolol (LOPRESSOR) 50 MG tablet Take 1 tablet (50 mg total) by mouth 2 (two) times daily. 11/07/15   Alycia Rossetti, MD  mometasone (ASMANEX 60 METERED DOSES) 220 MCG/INH inhaler Inhale 2 puffs into the lungs 2 (two) times daily. 08/08/15   Alycia Rossetti, MD  predniSONE (DELTASONE) 20 MG tablet Take 2 tablets (40 mg total) by mouth daily. 03/31/16   Virgel Manifold, MD  testosterone cypionate (DEPOTESTOSTERONE CYPIONATE) 200 MG/ML injection INJECT 1.5ML INTO THE MUSCLE EVERY 14 DAYS 11/25/15   Alycia Rossetti, MD  vitamin B-12 (CYANOCOBALAMIN) 1000 MCG tablet Take 1,000 mcg by mouth every morning.     Historical Provider, MD  vitamin E 400 UNIT capsule Take 400 Units by mouth every morning.     Historical Provider, MD   BP 161/82 mmHg  Pulse 67  Temp(Src) 98.8 F (37.1 C) (Oral)  Resp 22  Ht 5\' 6"  (1.676 m)  Wt 268 lb (121.564 kg)  BMI 43.28 kg/m2  SpO2 96% Physical Exam  Constitutional: He appears well-developed and well-nourished. No distress.  Sitting in bed. Hard of hearing. NAD.   HENT:  Head: Normocephalic and atraumatic.  Eyes: Conjunctivae are normal. Right eye exhibits no discharge. Left eye exhibits no discharge.  Neck: Neck supple.   Cardiovascular: Normal rate, regular rhythm and normal heart sounds.  Exam reveals no gallop and no friction rub.   No murmur heard. Pulmonary/Chest: Effort normal and breath sounds normal. No respiratory distress.  Abdominal: Soft. He exhibits no distension. There is no tenderness.  Musculoskeletal: He exhibits no edema or tenderness.  Neurological: He is alert.  Skin: Skin is warm and dry.  Faint erythema R ~T6 distribution  Psychiatric: He has a normal mood and affect. His behavior is normal. Thought content normal.  Nursing note and vitals reviewed.   ED Course  Procedures (  including critical care time) Labs Review Labs Reviewed - No data to display  Imaging Review No results found. I have personally reviewed and evaluated these images and lab results as part of my medical decision-making.   EKG Interpretation None      MDM   Final diagnoses:  Shingles    64yM with what i suspect is shingles. Consistent symptoms. dermatomal distribution. Symptoms only going on a couple days. Rash only faint erythema at this point. No clear vesicles. Will tx presumptively.    Virgel Manifold, MD 04/07/16 [phone]

## 2016-04-23 ENCOUNTER — Encounter: Payer: Self-pay | Admitting: Family Medicine

## 2016-04-23 MED ORDER — HYDROCODONE-ACETAMINOPHEN 10-325 MG PO TABS: 1.0000 | ORAL_TABLET | Freq: Three times a day (TID) | ORAL | Status: DC | PRN

## 2016-05-04 ENCOUNTER — Encounter (HOSPITAL_COMMUNITY): Payer: Self-pay | Admitting: Hematology & Oncology

## 2016-05-04 ENCOUNTER — Encounter (HOSPITAL_COMMUNITY): Payer: Medicare Other

## 2016-05-04 ENCOUNTER — Encounter (HOSPITAL_COMMUNITY): Payer: Medicare Other | Attending: Hematology & Oncology | Admitting: Hematology & Oncology

## 2016-05-04 VITALS — BP 150/95 | HR 59 | Temp 97.9°F | Resp 24 | Wt 264.8 lb

## 2016-05-04 DIAGNOSIS — B029 Zoster without complications: Secondary | ICD-10-CM | POA: Diagnosis not present

## 2016-05-04 DIAGNOSIS — C9110 Chronic lymphocytic leukemia of B-cell type not having achieved remission: Secondary | ICD-10-CM

## 2016-05-04 DIAGNOSIS — N183 Chronic kidney disease, stage 3 (moderate): Secondary | ICD-10-CM

## 2016-05-04 DIAGNOSIS — D801 Nonfamilial hypogammaglobulinemia: Secondary | ICD-10-CM | POA: Diagnosis not present

## 2016-05-04 LAB — CBC WITH DIFFERENTIAL/PLATELET
BASOS PCT: 0 %
Basophils Absolute: 0 10*3/uL (ref 0.0–0.1)
Eosinophils Absolute: 0.3 10*3/uL (ref 0.0–0.7)
Eosinophils Relative: 1 %
HEMATOCRIT: 45.4 % (ref 39.0–52.0)
HEMOGLOBIN: 13.7 g/dL (ref 13.0–17.0)
LYMPHS PCT: 76 %
Lymphs Abs: 20.3 10*3/uL — ABNORMAL HIGH (ref 0.7–4.0)
MCH: 25.8 pg — AB (ref 26.0–34.0)
MCHC: 30.2 g/dL (ref 30.0–36.0)
MCV: 85.7 fL (ref 78.0–100.0)
MONOS PCT: 3 %
Monocytes Absolute: 0.8 10*3/uL (ref 0.1–1.0)
NEUTROS ABS: 5.4 10*3/uL (ref 1.7–7.7)
Neutrophils Relative %: 20 %
Platelets: 180 10*3/uL (ref 150–400)
RBC: 5.3 MIL/uL (ref 4.22–5.81)
RDW: 17.3 % — ABNORMAL HIGH (ref 11.5–15.5)
WBC: 26.8 10*3/uL — ABNORMAL HIGH (ref 4.0–10.5)

## 2016-05-04 LAB — COMPREHENSIVE METABOLIC PANEL
ALBUMIN: 4.1 g/dL (ref 3.5–5.0)
ALK PHOS: 29 U/L — AB (ref 38–126)
ALT: 18 U/L (ref 17–63)
ANION GAP: 4 — AB (ref 5–15)
AST: 16 U/L (ref 15–41)
BILIRUBIN TOTAL: 0.5 mg/dL (ref 0.3–1.2)
BUN: 27 mg/dL — ABNORMAL HIGH (ref 6–20)
CO2: 27 mmol/L (ref 22–32)
CREATININE: 1.78 mg/dL — AB (ref 0.61–1.24)
Calcium: 8.9 mg/dL (ref 8.9–10.3)
Chloride: 105 mmol/L (ref 101–111)
GFR calc Af Amer: 44 mL/min — ABNORMAL LOW (ref >60)
GFR calc non Af Amer: 38 mL/min — ABNORMAL LOW (ref >60)
GLUCOSE: 104 mg/dL — AB (ref 65–99)
Potassium: 5.2 mmol/L — ABNORMAL HIGH (ref 3.5–5.1)
Sodium: 136 mmol/L (ref 135–145)
Total Protein: 6.3 g/dL — ABNORMAL LOW (ref 6.5–8.1)

## 2016-05-04 NOTE — Progress Notes (Signed)
[place] [facility] Note  Patient Care Team: Alycia Rossetti, MD as PCP - General (Family Medicine) Fay Records, MD as Consulting Physician (Cardiology)  CHIEF COMPLAINTS:  CLL Flow cytometry performed on 11/07/15 by Dr. Buelah Manis shows findings consistent with CLL/SLL CBC from 11/04/15 shows a total WBC of 25,000. Normal hemoglobin, HCT, PLT Stage III chronic kidney disease Jehovah's Witness Recurrent sinusitis, IVIG started 01/2016 Shingles  HISTORY OF PRESENTING ILLNESS:  [name] Ball 65 y.o. male is here for follow-up of CLL.  Mr. Rogacki is accompanied by his wife. Mr. Mangels is mostly deaf so his wife signed to him most of our visit to help him understand everything. I personally reviewed and went over laboratory studies with the patient and his wife.  The patient has returned from a recent trip to [place]. Before he went to [place] he began to break out with a rash spreading around his body. His wife noticed it and brought him to the ER where they found out it was the beginning stages of shingles. He still has itching and burning but he notes the rash is gone. It is much improved but not completely resolved. The shingles is the reason he missed his last scheduled appointment.  He is feeling okay. He has been eating well. He has not been sleeping well. When his pain pills wear off, he is immediately woken up.  His wife is requesting a list of supplements to help the patient's immune system. She notes that they could not afford IVIG.  He denies night sweats, fever or chills. No weight loss.    MEDICAL HISTORY:  Past Medical History  Diagnosis Date  . Allergy   . COPD (chronic obstructive pulmonary disease) ([place])   . Hypertension   . Thyroid disease     hypothryoid  . Migraine   . Ulcer   . Diverticulitis   . Solar purpura ([place])   . Asthma   . Low testosterone   . GERD (gastroesophageal reflux disease)   . Arthritis   . Ventral  hernia FEB 2015 CT     Large infra umbilical ventral hernia containing loops of small  . Diverticulosis     colonoscopy 3/15  . Cancer ([place])     kidney  . CLL (chronic lymphocytic leukemia) ([place]) 2016  . Shingles 03/31/2016    SURGICAL HISTORY: Past Surgical History  Procedure Laterality Date  . Kidney cyst removal  jan 2005    renal cell carcinoma  . Abdominal mesh inserted  june 2006, may 2008,dec 2010  . Knee minescus repair Left feb 2007  . Cholecystectomy  june 2007  . Colon surgery  sept 2007  . Colonoscopy    . Hernia repair  05/2005 5/20108, 11/2009    with mesh  . Colonoscopy N/A 02/20/2014    Procedure: COLONOSCOPY;  Surgeon: Danie Binder, MD;  Location: [facility];  Service: Endoscopy;  Laterality: N/A;  . Nephrectomy      SOCIAL HISTORY: Social History   Social History  . Marital Status: Married    Spouse Name: N/A  . Number of Children: N/A  . Years of Education: N/A   Occupational History  . Not on file.   Social History Main Topics  . Smoking status: Former Smoker    Quit date: 12/06/1996  . Smokeless tobacco: Never Used  . Alcohol Use: Yes     Comment: a few shots a few times a week  .  Drug Use: No  . Sexual Activity: Not on file   Other Topics Concern  . 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   He says he's been married 49 years, almost 21. He has 4 children and 6 grandchildren. No great-grandchildren yet.  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 He also cooks. He also worked in Public relations account executive.  He used to smoke. He quit about 17 years ago. He says he has a shot of whisky one or two nights a week, so he doesn't have problems with alcohol. He may, however, have a problem with soda -- Solicitor.  In terms of his hobbies, he jokes that he "likes  to do nothing." He cooks. He says he's the "second best cook on this side of the [place], but I'm not bragging." [place] food is his specialty, he says. He says he makes an excellent leg of lamb, and a great Moussaka. His wife makes a good Baklava. He also has a good [place] chicken recipe.  Why [place] food? When he was younger, he cooked and worked in [place], [place] with a family that was all [place].  They had a [place] festival coming up. So grandma came in and they cooked for the [place] -- maybe 2500 people.  She showed him each recipe and she was really tough. He says it really stuck with him ever since. He says when he gets his recipes, he gets them from families; family recipes. He says he's got about 3,000 of them. He says he doesn't give his recipes to anybody, but he'll make them. He says "I make the best pizza in the [place]." He says he makes a good soup, too. It's clear that he loves to cook very much.  FAMILY HISTORY: Family History  Problem Relation Age of Onset  . Cancer Mother     lung  . Colon cancer Neg Hx    indicated that his mother is deceased. He indicated that his father is deceased.    His biological father died of a stroke in bed; his mother died of brain cancer, metastasized breast cancer. They both died at 66. He says jokingly, "That's making me a little apprehensive, but that's okay." His mother died 83 years ago. He has a half brother and a half sister. His sister had juvenile diabetes; his brother's fine. No family history of CLL on his side that he knows of. They are familiar with CLL in general thanks to his wife's mother having it.  ALLERGIES:  is allergic to contrast media; other; strawberry extract; and ether.  MEDICATIONS:  Current Outpatient Prescriptions  Medication Sig Dispense Refill  . acetaminophen (TYLENOL) 325 MG tablet Take 650 mg by mouth every 6 (six) hours as needed.    Marland Kitchen albuterol (PROVENTIL) (2.5 MG/3ML) 0.083% nebulizer  solution TAKE 3MLS(1 VIAL) VIA NEBULIZER EVERY 6 HOURS AS NEEDED FOR WHEEZING OR SHORTNESS OF BREATH 150 mL 0  . allopurinol (ZYLOPRIM) 100 MG tablet Take 1 tablet (100 mg total) by mouth at bedtime. 90 tablet 1  . amLODipine (NORVASC) 5 MG tablet Take 1 tablet (5 mg total) by mouth daily. 90 tablet 3  . ATROVENT HFA 17 MCG/ACT inhaler INHALE 2 PUFFS BY MOUTH INTO THE LUNGS EVERY 6 HOURS  12.9 g 11  . calcium carbonate (TUMS - DOSED IN MG ELEMENTAL CALCIUM) 500 MG chewable tablet Chew 2-3 tablets by mouth once as needed for indigestion or heartburn. Reported on 11/21/2015    . cholecalciferol (VITAMIN D) 1000 UNITS tablet Take 1,000 Units by mouth every morning.     . citalopram (CELEXA) 10 MG tablet Take 1 tablet (10 mg total) by mouth daily. 90 tablet 2  . dexlansoprazole (DEXILANT) 60 MG capsule Take 1 capsule (60 mg total) by mouth daily. 180 capsule 1  . diphenhydrAMINE (BENADRYL) 25 MG tablet Take 25 mg by mouth every evening.     . Glucosamine-Chondroit-Vit C-Mn (GLUCOSAMINE 1500 COMPLEX) CAPS Take 1,200 capsules by mouth 2 (two) times daily.    Marland Kitchen HYDROcodone-acetaminophen (NORCO) 10-325 MG tablet Take 1-2 tablets by mouth every 8 (eight) hours as needed. 120 tablet 0  . levalbuterol (XOPENEX HFA) 45 MCG/ACT inhaler Inhale 1-2 puffs into the lungs every 4 (four) hours as needed for wheezing. 1 Inhaler 6  . levothyroxine (SYNTHROID, LEVOTHROID) 50 MCG tablet Take 1 tablet (50 mcg total) by mouth daily before breakfast. 90 tablet 3  . loratadine (CLARITIN) 10 MG tablet Take 10 mg by mouth every morning.     . metoprolol (LOPRESSOR) 50 MG tablet Take 1 tablet (50 mg total) by mouth 2 (two) times daily. 180 tablet 1  . mometasone (ASMANEX 60 METERED DOSES) 220 MCG/INH inhaler Inhale 2 puffs into the lungs 2 (two) times daily. 1 Inhaler 11  . testosterone cypionate (DEPOTESTOSTERONE CYPIONATE) 200 MG/ML injection INJECT 1.5ML INTO THE MUSCLE EVERY 14 DAYS 10 mL 0  . vitamin B-12 (CYANOCOBALAMIN)  1000 MCG tablet Take 1,000 mcg by mouth every morning.     . vitamin E 400 UNIT capsule Take 400 Units by mouth every morning.     . eletriptan (RELPAX) 20 MG tablet Take 1 tablet (20 mg total) by mouth as needed for migraine. One tablet by mouth at onset of headache. May repeat in 2 hours if headache persists or recurs. (Patient not taking: Reported on 05/04/2016) 10 tablet 3  . predniSONE (DELTASONE) 20 MG tablet Take 2 tablets (40 mg total) by mouth daily. (Patient not taking: Reported on 05/04/2016) 10 tablet 0   No current facility-administered medications for this visit.    Review of Systems  Constitutional: Negative for fever, chills, weight loss and malaise/fatigue.  HENT: Positive for hearing loss. Negative for congestion, nosebleeds, sore throat and tinnitus.   Eyes: Negative.  Negative for blurred vision, double vision, pain and discharge.  Respiratory: Negative.  Negative for cough, hemoptysis, sputum production, shortness of breath and wheezing.        Has COPD  Cardiovascular: Negative.  Negative for chest pain, palpitations, claudication, leg swelling and PND.  Gastrointestinal: Negative.  Negative for heartburn, nausea, vomiting, abdominal pain, diarrhea, constipation, blood in stool and melena.  Genitourinary: Negative.  Negative for dysuria, urgency, frequency and hematuria.  Musculoskeletal: Negative.  Negative for myalgias, joint pain and falls.  Skin: Positive for itching and rash.       Resolving shingles.  Neurological: Negative.  Negative for dizziness, tingling, tremors, sensory change, speech change, focal weakness, seizures, loss of consciousness, weakness and headaches.  Endo/Heme/Allergies: Negative.  Does not bruise/bleed easily.  Psychiatric/Behavioral: Negative.  Negative for depression, suicidal ideas, memory loss and substance abuse. The patient is not nervous/anxious and does not have insomnia.   All other systems reviewed and are negative. 14 point ROS was  done and  is otherwise as detailed above or in HPI   PHYSICAL EXAMINATION: ECOG PERFORMANCE STATUS: 1 - Symptomatic but completely ambulatory   Vitals with BMI 05/04/2016  Height   Weight 264 lbs 13 oz  BMI   Systolic Q000111Q  Diastolic 95  Pulse 59  Respirations 24    Physical Exam  Constitutional: He is oriented to person, place, and time and well-developed, well-nourished, and in no distress.  HENT:  Head: Normocephalic and atraumatic.  Nose: Nose normal.  Mouth/Throat: Oropharynx is clear and moist. No oropharyngeal exudate.  Eyes: Conjunctivae and EOM are normal. Pupils are equal, round, and reactive to light. Right eye exhibits no discharge. Left eye exhibits no discharge. No scleral icterus.  Neck: Normal range of motion. Neck supple. No tracheal deviation present. No thyromegaly present.  Cardiovascular: Normal rate, regular rhythm and normal heart sounds.  Exam reveals no gallop and no friction rub.   No murmur heard. Pulmonary/Chest: Effort normal and breath sounds normal. He has no wheezes. He has no rales.  Abdominal: Soft. Bowel sounds are normal. He exhibits no distension and no mass. There is no tenderness. There is no rebound and no guarding.  Musculoskeletal: Normal range of motion. He exhibits no edema.  Lymphadenopathy:    He has no cervical adenopathy.  Neurological: He is alert and oriented to person, place, and time. He has normal reflexes. No cranial nerve deficit. Gait normal. Coordination normal.  Skin: Skin is warm and dry.  Psychiatric: Mood, memory, affect and judgment normal.  Nursing note and vitals reviewed.  LABORATORY DATA:  I have reviewed the data as listed Lab Results  Component Value Date   WBC 26.8* 05/04/2016   HGB 13.7 05/04/2016   HCT 45.4 05/04/2016   MCV 85.7 05/04/2016   PLT 180 05/04/2016   CMP     Component Value Date/Time   NA 136 05/04/2016 1305   NA 138 01/31/2013   K 5.2* 05/04/2016 1305   CL 105 05/04/2016 1305   CO2 27  05/04/2016 1305   GLUCOSE 104* 05/04/2016 1305   BUN 27* 05/04/2016 1305   BUN 19 01/31/2013   CREATININE 1.78* 05/04/2016 1305   CREATININE 1.58* 11/07/2015 1621   CREATININE 1.6* 01/31/2013   CALCIUM 8.9 05/04/2016 1305   PROT 6.3* 05/04/2016 1305   ALBUMIN 4.1 05/04/2016 1305   AST 16 05/04/2016 1305   ALT 18 05/04/2016 1305   ALKPHOS 29* 05/04/2016 1305   BILITOT 0.5 05/04/2016 1305   GFRNONAA 38* 05/04/2016 1305   GFRNONAA 46* 04/03/2015 1550   GFRAA 44* 05/04/2016 1305   GFRAA 53* 04/03/2015 1550   Results for MALIQ, INCLAN (MRN [id number]) as of 05/04/2016 14:00  Ref. Range 02/26/2016 09:00  IgG (Immunoglobin G), Serum Latest Ref Range: 380-295-2851 mg/dL 714  IgA Latest Ref Range: 61-437 mg/dL 63  IgM, Serum Latest Ref Range: 20-172 mg/dL <5 (L)      RADIOLOGY: I have personally reviewed the radiological images as listed and agreed with the findings in the report.  CLINICAL DATA: Right-sided lymphadenopathy. Palpable knot on the right neck. Recent diagnosis of lymphocytic leukemia.  EXAM: CT NECK WITHOUT CONTRAST  TECHNIQUE: Multidetector CT imaging of the neck was performed following the standard protocol without intravenous contrast.  COMPARISON: None.  FINDINGS: Pharynx and larynx: No focal mucosal or submucosal lesions are present. Vocal cords are midline and symmetric. There is mild prominence of the adenoid tissue. Fullness is noted in the lingual tonsils. No discrete lesion is evident.  Salivary glands: Intra parotid lymph nodes are present bilaterally. The parotid and submandibular glands are otherwise normal.  Thyroid: Negative  Lymph nodes: Multiple bilateral ovoid lymph nodes are present in both sides the neck. These are not pathologically enlarged but the number is significantly increased over expectation.  Vascular: Atherosclerotic changes are present at the aorta and branch vessels. Minimal calcifications are present at the  carotid bifurcations bilaterally without significant stenosis.  Limited intracranial: Unremarkable.  Visualized orbits: Within normal limits  Mastoids and visualized paranasal sinuses: The paranasal sinuses are clear. A left mastoid and middle ear effusion is present. No obstructing nasopharyngeal lesion is present.  Skeleton: Bone windows demonstrate reversal of the normal cervical lordosis. There is chronic loss of disc height at C4-5 and C5-6. Uncovertebral spurring is most noted at these 2 levels. The patient is edentulous. No focal lytic or blastic lesions are present.  Upper chest: A 12 x 11 mm right upper lobe pulmonary nodule is present. This is well-defined without spiculation. No other focal nodule, mass, or airspace disease is present. Sub cm right peritracheal lymph nodes are noted.  IMPRESSION: 1. Multiple sub cm ovoid lymph nodes within the neck bilaterally. Although no focally enlarged nodes are present, these could be related to the recent diagnosis of leukemia. 2. No focal mass lesion subjacent to the area marked. This is near the right submandibular gland. 3. Advanced spondylosis in the cervical spine. 4. Atherosclerosis.   Electronically Signed  By: San Morelle M.D.  On: 01/02/2016 16:49   ASSESSMENT & PLAN:  CLL Hypogammaglobulenemia Normal hemoglobin, HCT, PLT count History of RCC status post S/P nephrectomy, no medical records available CKD, Stage III Shingles   He received several doses of IVIG but it was discontinued secondary to cost. He is currently doing well. He has had a recent shingles outbreak but had this promptly treated.   I personally reviewed and went over laboratory studies with the patient and his wife. His counts are stable.   The patient will discontinue IVIG therapy due to financial concerns.   He will return for follow up in 3 months with repeat labs. If his labs are stable at this next visit, we will space  our follow up schedule to 4 months.  Orders Placed This Encounter  Procedures  . CBC with Differential    Standing Status: Future     Number of Occurrences:      Standing Expiration Date: 05/04/2017  . Comprehensive metabolic panel    Standing Status: Future     Number of Occurrences:      Standing Expiration Date: 05/04/2017  . Lactate dehydrogenase    Standing Status: Future     Number of Occurrences:      Standing Expiration Date: 05/04/2017    All questions were answered. The patient knows to call the clinic with any problems, questions or concerns.  This document serves as a record of services personally performed by Ancil Linsey, MD. It was created on her behalf by Arlyce Harman, a trained medical scribe. The creation of this record is based on the scribe's personal observations and the provider's statements to them. This document has been checked and approved by the attending provider.  I have reviewed the above documentation for accuracy and completeness, and I agree with the above.  This note was electronically signed.    Molli Hazard, MD

## 2016-05-04 NOTE — Patient Instructions (Signed)
[place] at [facility] Discharge Instructions  RECOMMENDATIONS MADE BY THE CONSULTANT AND ANY TEST RESULTS WILL BE SENT TO YOUR REFERRING PHYSICIAN.  Return in 3 months to see Dr. Whitney Muse  Labs in 3 months also  Thank you for choosing [facility] at [facility] to provide your oncology and hematology care.  To afford each patient quality time with our provider, please arrive at least 15 minutes before your scheduled appointment time.   Beginning January 23rd 2017 lab work for the [facility] will be done in the  Main lab at [facility] on 1st floor. If you have a lab appointment with the [facility] please come in thru the  Main Entrance and check in at the main information desk  You need to re-schedule your appointment should you arrive 10 or more minutes late.  We strive to give you quality time with our providers, and arriving late affects you and other patients whose appointments are after yours.  Also, if you no show three or more times for appointments you may be dismissed from the clinic at the providers discretion.     Again, thank you for choosing [facility].  Our hope is that these requests will decrease the amount of time that you wait before being seen by our physicians.       _____________________________________________________________  Should you have questions after your visit to [facility], please contact our office at [phone] between the hours of 8:30 a.m. and 4:30 p.m.  Voicemails left after 4:30 p.m. will not be returned until the following business day.  For prescription refill requests, have your pharmacy contact our office.         Resources For Cancer Patients and their Caregivers ? American Cancer Society: Can assist with transportation, wigs, general needs, runs Look Good Feel Better.        4027817862 ? Cancer Care: Provides financial assistance, online support groups,  medication/co-pay assistance.  1-800-813-HOPE 409 177 2558) ? [place] Assists [place] cancer patients and their families through emotional , educational and financial support.  210-883-3772 ? [place] DSS Where to apply for food stamps, Medicaid and utility assistance. 7318376509 ? [facility]: Transportation to medical appointments. 601-118-5956 ? Social Security Administration: May apply for disability if have a Stage IV cancer. 302-108-3374 (712)604-5253 ? [employer], Disability and Transit Services: Assists with nutrition, care and transit needs. [place] Support Programs: @10RELATIVEDAYS @ > Cancer Support Group  2nd Tuesday of the month 1pm-2pm, Journey Room  > Creative Journey  3rd Tuesday of the month 1130am-1pm, Journey Room  > Look Good Feel Better  1st Wednesday of the month 10am-12 noon, Journey Room (Call [place] to register [phone])

## 2016-05-07 LAB — IGG, IGA, IGM
IgA: 54 mg/dL — ABNORMAL LOW (ref 61–437)
IgG (Immunoglobin G), Serum: 574 mg/dL — ABNORMAL LOW (ref 700–1600)
IgM, Serum: <5 mg/dL — ABNORMAL LOW (ref 20–172)

## 2016-05-25 ENCOUNTER — Encounter: Payer: Self-pay | Admitting: Family Medicine

## 2016-05-25 ENCOUNTER — Ambulatory Visit (INDEPENDENT_AMBULATORY_CARE_PROVIDER_SITE_OTHER): Payer: Medicare Other | Admitting: Family Medicine

## 2016-05-25 VITALS — BP 138/82 | HR 66 | Temp 97.7°F | Resp 22 | Ht 66.5 in | Wt 268.0 lb

## 2016-05-25 DIAGNOSIS — J069 Acute upper respiratory infection, unspecified: Secondary | ICD-10-CM | POA: Diagnosis not present

## 2016-05-25 DIAGNOSIS — R7303 Prediabetes: Secondary | ICD-10-CM

## 2016-05-25 DIAGNOSIS — C9110 Chronic lymphocytic leukemia of B-cell type not having achieved remission: Secondary | ICD-10-CM

## 2016-05-25 LAB — HEMOGLOBIN A1C, FINGERSTICK: Hgb A1C (fingerstick): 6 % — ABNORMAL HIGH (ref <5.7)

## 2016-05-25 MED ORDER — HYDROCODONE-ACETAMINOPHEN 10-325 MG PO TABS: 1.0000 | ORAL_TABLET | Freq: Three times a day (TID) | ORAL | Status: DC | PRN

## 2016-05-25 MED ORDER — DOXYCYCLINE HYCLATE 100 MG PO TABS: 100.0000 mg | ORAL_TABLET | Freq: Two times a day (BID) | ORAL | Status: DC

## 2016-05-25 NOTE — Assessment & Plan Note (Signed)
Recheck A1C, monitor diet

## 2016-05-25 NOTE — Assessment & Plan Note (Signed)
Reviewed oncology note,due to cost and SE stopped IVIG

## 2016-05-25 NOTE — Progress Notes (Signed)
Patient ID: [name] Ball, male   DOB: February 28, 1951, 65 y.o.   MRN: [id number]   Subjective:    Patient ID: [name] Ball, male    DOB: 09-Nov-1951, 65 y.o.   MRN: [id number]  Patient presents for Illness     Review Of Systems:  GEN- denies fatigue, fever, weight loss,weakness, recent illness HEENT- denies eye drainage, change in vision, nasal discharge, CVS- denies chest pain, palpitations RESP- denies SOB, cough, wheeze ABD- denies N/V, change in stools, abd pain GU- denies dysuria, hematuria, dribbling, incontinence MSK- denies joint pain, muscle aches, injury Neuro- denies headache, dizziness, syncope, seizure activity       Objective:    BP 138/82 mmHg  Pulse 66  Temp(Src) 97.7 F (36.5 C) (Oral)  Resp 22  Ht 5' 6.5" (1.689 m)  Wt 268 lb (121.564 kg)  BMI 42.61 kg/m2  SpO2 97% GEN- NAD, alert and oriented x3 HEENT- PERRL, EOMI, non injected sclera, pink conjunctiva, MMM, oropharynx mild injection, TM clear bilat no effusion,  + maxillary sinus tenderness, inflammed turbinates,  Nasal drainage  Neck- Supple, shotty  LAD CVS- RRR, no murmur RESP-CTAB EXT- No edema Pulses- Radial 2+          Assessment & Plan:      Problem List Items Addressed This Visit    Prediabetes    Recheck A1C, monitor diet      Relevant Orders   Hemoglobin A1C, fingerstick   Chronic lymphocytic leukemia (CLL), B-cell (HCC)    Reviewed oncology note,due to cost and SE stopped IVIG      Relevant Medications   HYDROcodone-acetaminophen (NORCO) 10-325 MG tablet    Other Visit Diagnoses    Acute URI    -  Primary    early URI symptoms, not quite bronchitis yet or sinusitis, due to his CLL and immunocompromised state and history with bronchitis/sinusitis,will give doxycyclin       Note: This dictation was prepared with Dragon dictation along with smaller phrase technology. Any transcriptional errors that result from this process are unintentional.

## 2016-05-25 NOTE — Patient Instructions (Addendum)
Take antibiotics as prescribed Take the prednisone if you get worse  F/U 4 Months

## 2016-05-28 ENCOUNTER — Other Ambulatory Visit: Payer: Self-pay | Admitting: Family Medicine

## 2016-05-28 NOTE — Telephone Encounter (Signed)
Refill appropriate and filled per protocol. 

## 2016-06-06 ENCOUNTER — Encounter: Payer: Self-pay | Admitting: Family Medicine

## 2016-06-10 ENCOUNTER — Encounter: Payer: Self-pay | Admitting: Family Medicine

## 2016-06-11 ENCOUNTER — Other Ambulatory Visit (HOSPITAL_COMMUNITY): Payer: Self-pay | Admitting: Emergency Medicine

## 2016-06-11 ENCOUNTER — Encounter (HOSPITAL_COMMUNITY): Payer: Self-pay | Admitting: Hematology & Oncology

## 2016-06-11 ENCOUNTER — Telehealth (HOSPITAL_COMMUNITY): Payer: Self-pay | Admitting: *Deleted

## 2016-06-11 DIAGNOSIS — G4733 Obstructive sleep apnea (adult) (pediatric): Secondary | ICD-10-CM | POA: Diagnosis not present

## 2016-06-11 MED ORDER — DOXYCYCLINE HYCLATE 100 MG PO TABS: 100.0000 mg | ORAL_TABLET | Freq: Two times a day (BID) | ORAL | Status: DC

## 2016-06-11 NOTE — Telephone Encounter (Signed)
Refilled doxycycline.

## 2016-06-16 ENCOUNTER — Encounter: Payer: Self-pay | Admitting: Family Medicine

## 2016-06-16 ENCOUNTER — Ambulatory Visit (HOSPITAL_COMMUNITY)
Admission: RE | Admit: 2016-06-16 | Discharge: 2016-06-16 | Disposition: A | Discharge status: Home / Self Care | Payer: Medicare Other | Source: Ambulatory Visit | Attending: Family Medicine | Admitting: Family Medicine

## 2016-06-16 ENCOUNTER — Ambulatory Visit (INDEPENDENT_AMBULATORY_CARE_PROVIDER_SITE_OTHER): Payer: Medicare Other | Admitting: Family Medicine

## 2016-06-16 VITALS — BP 152/78 | HR 66 | Temp 98.4°F | Resp 24 | Ht 66.5 in | Wt 264.0 lb

## 2016-06-16 DIAGNOSIS — J029 Acute pharyngitis, unspecified: Secondary | ICD-10-CM | POA: Diagnosis not present

## 2016-06-16 DIAGNOSIS — J441 Chronic obstructive pulmonary disease with (acute) exacerbation: Secondary | ICD-10-CM

## 2016-06-16 DIAGNOSIS — R079 Chest pain, unspecified: Secondary | ICD-10-CM

## 2016-06-16 DIAGNOSIS — C9110 Chronic lymphocytic leukemia of B-cell type not having achieved remission: Secondary | ICD-10-CM

## 2016-06-16 DIAGNOSIS — R0602 Shortness of breath: Secondary | ICD-10-CM | POA: Diagnosis not present

## 2016-06-16 DIAGNOSIS — H6123 Impacted cerumen, bilateral: Secondary | ICD-10-CM

## 2016-06-16 LAB — CBC WITH DIFFERENTIAL/PLATELET
BASOS ABS: 0 {cells}/uL (ref 0–200)
Basophils Relative: 0 %
Eosinophils Absolute: 518 cells/uL — ABNORMAL HIGH (ref 15–500)
Eosinophils Relative: 1 %
HEMATOCRIT: 46.8 % (ref 38.5–50.0)
HEMOGLOBIN: 15 g/dL (ref 13.0–17.0)
LYMPHS ABS: 37814 {cells}/uL — AB (ref 850–3900)
Lymphocytes Relative: 73 %
MCH: 26.8 pg — AB (ref 27.0–33.0)
MCHC: 32.1 g/dL (ref 32.0–36.0)
MCV: 83.7 fL (ref 80.0–100.0)
MONO ABS: 3108 {cells}/uL — AB (ref 200–950)
MPV: 10.1 fL (ref 7.5–12.5)
Monocytes Relative: 6 %
NEUTROS PCT: 20 %
Neutro Abs: 10360 cells/uL — ABNORMAL HIGH (ref 1500–7800)
Platelets: 193 10*3/uL (ref 140–400)
RBC: 5.59 MIL/uL (ref 4.20–5.80)
RDW: 17.2 % — ABNORMAL HIGH (ref 11.0–15.0)
WBC: 51.8 10*3/uL — ABNORMAL HIGH (ref 3.8–10.8)

## 2016-06-16 LAB — BASIC METABOLIC PANEL
BUN: 21 mg/dL (ref 7–25)
CALCIUM: 8.9 mg/dL (ref 8.6–10.3)
CO2: 24 mmol/L (ref 20–31)
Chloride: 103 mmol/L (ref 98–110)
Creat: 1.68 mg/dL — ABNORMAL HIGH (ref 0.70–1.25)
GLUCOSE: 92 mg/dL (ref 70–99)
POTASSIUM: 4.5 mmol/L (ref 3.5–5.3)
SODIUM: 137 mmol/L (ref 135–146)

## 2016-06-16 LAB — STREP GROUP A AG, W/REFLEX TO CULT: STREGTOCOCCUS GROUP A AG SCREEN: NOT DETECTED

## 2016-06-16 MED ORDER — IPRATROPIUM-ALBUTEROL 0.5-2.5 (3) MG/3ML IN SOLN
3.0000 mL | Freq: Once | RESPIRATORY_TRACT | Status: AC
  Administered 2016-06-16: 3 mL via RESPIRATORY_TRACT

## 2016-06-16 MED ORDER — LEVOFLOXACIN 500 MG PO TABS: 500.0000 mg | ORAL_TABLET | Freq: Every day | ORAL | Status: DC

## 2016-06-16 NOTE — Progress Notes (Signed)
Patient ID: [name] Ball, male   DOB: December 06, 1951, 65 y.o.   MRN: [id number]    Subjective:    Patient ID: [name] Ball, male    DOB: 02-25-1951, 65 y.o.   MRN: [id number]  Patient presents for URI Patient here for ongoing illness. He has underlying CLL B cell type, is treated for upper respiratory infection on June 20 with a relative doxycycline and prednisone ( completed yesterday). He completed this and improved some but then the symptoms came back his oncologist called in another round of doxycycline on July 7 which she should complete on Friday but he still complaining of sore throat and ear pain and chest tightness though they have not heard any audible wheezing. He is using his nebulizer and other inhalers as prescribed.    Review Of Systems:  GEN- + fatigue, fever, weight loss,weakness, recent illness HEENT- denies eye drainage, change in vision, nasal discharge, CVS- denies chest pain, palpitations RESP-+SOB, +cough, +wheeze ABD- denies N/V, change in stools, abd pain GU- denies dysuria, hematuria, dribbling, incontinence MSK- denies joint pain, muscle aches, injury Neuro- denies headache, dizziness, syncope, seizure activity       Objective:    BP 152/78 mmHg  Pulse 66  Temp(Src) 98.4 F (36.9 C) (Oral)  Resp 24  Ht 5' 6.5" (1.689 m)  Wt 264 lb (119.75 kg)  BMI 41.98 kg/m2  SpO2 93% GEN- NAD, alert and oriented x3 HEENT- PERRL, EOMI, non injected sclera, pink conjunctiva, MMM, oropharynx injected, TM obscurred by wax impaction, s/p removal clear TM, nares clear  Neck- Supple, shotty LAD  CVS- RRR, no murmur RESP- congestion bilat, decreased BS right base, normal WOB, barky cough  EXT- No edema Pulses- Radial, DP- 2+  Improved WOB s/p DUONEB  EKG- NSR, no ST change       Assessment & Plan:      Problem List Items Addressed This Visit    Chronic lymphocytic leukemia (CLL), B-cell (HCC)   Relevant Orders   CBC with Differential    Other Visit  Diagnoses    COPD exacerbation ([place])    -  Primary    Current exacerbation, with some sinusitis like symptoms- with his CLL difficult to treat him, he seems to be declining despite previous efforts, CXR to be done, labs Continue albuterol q 4 hours Change to Levaquin  Mucinex DM    Relevant Medications    ipratropium-albuterol (DUONEB) 0.5-2.5 (3) MG/3ML nebulizer solution 3 mL (Completed)    Other Relevant Orders    DG Chest 2 View (Completed)    Basic metabolic panel    Chest pain, unspecified chest pain type        atypical , I think secondary to COPD, EKG reassuring, check labs    Relevant Orders    EKG 12-Lead (Completed)    Sore throat        Strep neg, may be more post nasal drip    Relevant Orders    STREP GROUP A AG, W/REFLEX TO CULT (Completed)    Cerumen impaction, bilateral        s/p irrigation       Note: This dictation was prepared with Dragon dictation along with smaller phrase technology. Any transcriptional errors that result from this process are unintentional.

## 2016-06-16 NOTE — Patient Instructions (Addendum)
Get chest xray We will call with lab results  Use nebulizer every 4 hours as needed for next 2 days  Mucinex DM Take Levaquin Use Asamanex daily F/U pending results

## 2016-06-17 ENCOUNTER — Telehealth: Payer: Self-pay | Admitting: *Deleted

## 2016-06-17 NOTE — Telephone Encounter (Signed)
Received call from lab with critical value of WBC 51.8 (H).   PA reviewed labs and CXR from 01-14-202017. Recommended reporting values to Oncology/ Hematology.   Call placed to Dr. Whitney Muse with [facility]. Spoke with Amy. Advised of lab results and CXR results. Amy will make Dr. Whitney Muse aware and contact patient with further recommendations.   MD to be made aware.

## 2016-06-17 NOTE — Telephone Encounter (Signed)
See lab note.  

## 2016-06-18 ENCOUNTER — Encounter: Payer: Self-pay | Admitting: Family Medicine

## 2016-06-18 DIAGNOSIS — D72829 Elevated white blood cell count, unspecified: Secondary | ICD-10-CM

## 2016-06-23 ENCOUNTER — Encounter: Payer: Self-pay | Admitting: Family Medicine

## 2016-06-23 ENCOUNTER — Other Ambulatory Visit: Payer: Medicare Other

## 2016-06-23 DIAGNOSIS — D72829 Elevated white blood cell count, unspecified: Secondary | ICD-10-CM | POA: Diagnosis not present

## 2016-06-23 MED ORDER — HYDROCODONE-ACETAMINOPHEN 10-325 MG PO TABS: 1.0000 | ORAL_TABLET | Freq: Three times a day (TID) | ORAL | Status: DC | PRN

## 2016-06-23 NOTE — Telephone Encounter (Signed)
RX printed, left up front and patient aware to pick up in the morning via mychart message

## 2016-06-24 LAB — CBC WITH DIFFERENTIAL/PLATELET
Basophils Absolute: 0 cells/uL (ref 0–200)
Basophils Relative: 0 %
Eosinophils Absolute: 307 cells/uL (ref 15–500)
Eosinophils Relative: 1 %
HCT: 46.4 % (ref 38.5–50.0)
HEMOGLOBIN: 14.1 g/dL (ref 13.0–17.0)
LYMPHS PCT: 70 %
Lymphs Abs: 21490 cells/uL — ABNORMAL HIGH (ref 850–3900)
MCH: 26.3 pg — AB (ref 27.0–33.0)
MCHC: 30.4 g/dL — AB (ref 32.0–36.0)
MCV: 86.4 fL (ref 80.0–100.0)
MONO ABS: 921 {cells}/uL (ref 200–950)
MONOS PCT: 3 %
MPV: 10.3 fL (ref 7.5–12.5)
NEUTROS ABS: 7982 {cells}/uL — AB (ref 1500–7800)
Neutrophils Relative %: 26 %
PLATELETS: 159 10*3/uL (ref 140–400)
RBC: 5.37 MIL/uL (ref 4.20–5.80)
RDW: 16.9 % — AB (ref 11.0–15.0)
WBC: 30.7 10*3/uL — AB (ref 3.8–10.8)

## 2016-07-19 ENCOUNTER — Other Ambulatory Visit: Payer: Self-pay | Admitting: Family Medicine

## 2016-07-20 NOTE — Telephone Encounter (Signed)
Ok to refill 

## 2016-07-20 NOTE — Telephone Encounter (Signed)
okay

## 2016-07-21 NOTE — Telephone Encounter (Signed)
RX faxed to [facility].

## 2016-07-28 ENCOUNTER — Encounter: Payer: Self-pay | Admitting: Family Medicine

## 2016-07-28 MED ORDER — HYDROCODONE-ACETAMINOPHEN 10-325 MG PO TABS: 1.0000 | ORAL_TABLET | Freq: Three times a day (TID) | ORAL | 0 refills | Status: DC | PRN

## 2016-08-05 ENCOUNTER — Encounter (HOSPITAL_COMMUNITY): Payer: Medicare Other

## 2016-08-05 ENCOUNTER — Encounter (HOSPITAL_COMMUNITY): Payer: Medicare Other | Attending: Hematology & Oncology | Admitting: Hematology & Oncology

## 2016-08-05 ENCOUNTER — Encounter (HOSPITAL_COMMUNITY): Payer: Self-pay | Admitting: Hematology & Oncology

## 2016-08-05 VITALS — BP 148/78 | HR 71 | Temp 98.0°F | Resp 20 | Wt 266.8 lb

## 2016-08-05 DIAGNOSIS — D801 Nonfamilial hypogammaglobulinemia: Secondary | ICD-10-CM | POA: Diagnosis not present

## 2016-08-05 DIAGNOSIS — B029 Zoster without complications: Secondary | ICD-10-CM | POA: Diagnosis not present

## 2016-08-05 DIAGNOSIS — C9110 Chronic lymphocytic leukemia of B-cell type not having achieved remission: Secondary | ICD-10-CM | POA: Insufficient documentation

## 2016-08-05 DIAGNOSIS — Z85528 Personal history of other malignant neoplasm of kidney: Secondary | ICD-10-CM

## 2016-08-05 DIAGNOSIS — N183 Chronic kidney disease, stage 3 (moderate): Secondary | ICD-10-CM | POA: Diagnosis not present

## 2016-08-05 LAB — CBC WITH DIFFERENTIAL/PLATELET
BAND NEUTROPHILS: 0 %
BASOS ABS: 0 10*3/uL (ref 0.0–0.1)
BASOS PCT: 0 %
Blasts: 0 %
EOS ABS: 0.5 10*3/uL (ref 0.0–0.7)
Eosinophils Relative: 1 %
HCT: 50 % (ref 39.0–52.0)
Hemoglobin: 15.6 g/dL (ref 13.0–17.0)
LYMPHS ABS: 40.4 10*3/uL — AB (ref 0.7–4.0)
Lymphocytes Relative: 84 %
MCH: 27 pg (ref 26.0–34.0)
MCHC: 31.2 g/dL (ref 30.0–36.0)
MCV: 86.7 fL (ref 78.0–100.0)
METAMYELOCYTES PCT: 0 %
MONO ABS: 1 10*3/uL (ref 0.1–1.0)
MYELOCYTES: 0 %
Monocytes Relative: 2 %
NEUTROS PCT: 13 %
NRBC: 0 /100{WBCs}
Neutro Abs: 6.3 10*3/uL (ref 1.7–7.7)
Other: 0 %
PLATELETS: 185 10*3/uL (ref 150–400)
PROMYELOCYTES ABS: 0 %
RBC: 5.77 MIL/uL (ref 4.22–5.81)
RDW: 16.5 % — AB (ref 11.5–15.5)
WBC: 48.2 10*3/uL — ABNORMAL HIGH (ref 4.0–10.5)

## 2016-08-05 LAB — COMPREHENSIVE METABOLIC PANEL
ALT: 22 U/L (ref 17–63)
AST: 24 U/L (ref 15–41)
Albumin: 4.7 g/dL (ref 3.5–5.0)
Alkaline Phosphatase: 40 U/L (ref 38–126)
Anion gap: 9 (ref 5–15)
BUN: 14 mg/dL (ref 6–20)
CHLORIDE: 105 mmol/L (ref 101–111)
CO2: 25 mmol/L (ref 22–32)
CREATININE: 1.61 mg/dL — AB (ref 0.61–1.24)
Calcium: 9 mg/dL (ref 8.9–10.3)
GFR calc Af Amer: 50 mL/min — ABNORMAL LOW (ref >60)
GFR calc non Af Amer: 43 mL/min — ABNORMAL LOW (ref >60)
Glucose, Bld: 117 mg/dL — ABNORMAL HIGH (ref 65–99)
POTASSIUM: 4.5 mmol/L (ref 3.5–5.1)
SODIUM: 139 mmol/L (ref 135–145)
Total Bilirubin: 0.9 mg/dL (ref 0.3–1.2)
Total Protein: 6.8 g/dL (ref 6.5–8.1)

## 2016-08-05 LAB — LACTATE DEHYDROGENASE: LDH: 152 U/L (ref 98–192)

## 2016-08-05 NOTE — Patient Instructions (Addendum)
[place] at [facility] Discharge Instructions  RECOMMENDATIONS MADE BY THE CONSULTANT AND ANY TEST RESULTS WILL BE SENT TO YOUR REFERRING PHYSICIAN.  You saw Dr. Whitney Muse today. Follow up ion 4 months with lab work.  Thank you for choosing [place] at [facility] to provide your oncology and hematology care.  To afford each patient quality time with our provider, please arrive at least 15 minutes before your scheduled appointment time.   Beginning January 23rd 2017 lab work for the [facility] will be done in the  Main lab at [facility] on 1st floor. If you have a lab appointment with the [facility] please come in thru the  Main Entrance and check in at the main information desk  You need to re-schedule your appointment should you arrive 10 or more minutes late.  We strive to give you quality time with our providers, and arriving late affects you and other patients whose appointments are after yours.  Also, if you no show three or more times for appointments you may be dismissed from the clinic at the providers discretion.     Again, thank you for choosing [facility].  Our hope is that these requests will decrease the amount of time that you wait before being seen by our physicians.       _____________________________________________________________  Should you have questions after your visit to [facility], please contact our office at [phone] between the hours of 8:30 a.m. and 4:30 p.m.  Voicemails left after 4:30 p.m. will not be returned until the following business day.  For prescription refill requests, have your pharmacy contact our office.         Resources For Cancer Patients and their Caregivers ? American Cancer Society: Can assist with transportation, wigs, general needs, runs Look Good Feel Better.        715-263-2814 ? Cancer Care: Provides financial assistance, online support  groups, medication/co-pay assistance.  1-800-813-HOPE 7196065338) ? [place] Assists [place] cancer patients and their families through emotional , educational and financial support.  541-650-0498 ? [place] DSS Where to apply for food stamps, Medicaid and utility assistance. (838)128-5961 ? [facility]: Transportation to medical appointments. 8205771916 ? Social Security Administration: May apply for disability if have a Stage IV cancer. 725-575-4479 726-483-8830 ? [employer], Disability and Transit Services: Assists with nutrition, care and transit needs. [place] Support Programs: @10RELATIVEDAYS @ > Cancer Support Group  2nd Tuesday of the month 1pm-2pm, Journey Room  > Creative Journey  3rd Tuesday of the month 1130am-1pm, Journey Room  > Look Good Feel Better  1st Wednesday of the month 10am-12 noon, Journey Room (Call [place] to register [phone])

## 2016-08-05 NOTE — Progress Notes (Signed)
[name] at [place] Note  Patient Care Team: Alycia Rossetti, MD as PCP - General (Family Medicine) Fay Records, MD as Consulting Physician (Cardiology)  CHIEF COMPLAINTS:  CLL Flow cytometry performed on 11/07/15 by Dr. Buelah Manis shows findings consistent with CLL/SLL CBC from 11/04/15 shows a total WBC of 25,000. Normal hemoglobin, HCT, PLT Stage III chronic kidney disease Jehovah's Witness Recurrent sinusitis, IVIG started 01/2016, discontinued secondary to cost Shingles  HISTORY OF PRESENTING ILLNESS:  [name] Ball 65 y.o. male is here for follow-up of CLL.  [name] Ball is accompanied by his wife. [name] Ball is mostly deaf so his wife signed to him most of our visit to help him understand everything. I personally reviewed and went over laboratory studies with the patient and his wife.  Labs reviewed and discussed with patient  Patient does not have any new pain.   Patient has stopped doxycycline as advised by his other physician and was put on new antibiotic.  He has been doing a lot of walking and cooking. Patient's wife says the weather makes a difference in how he feels and how he breathes. When temperature is cooler and humidity is down he feels better.   His biggest issue is COPD. Patient's wife says Dr. Buelah Manis has said to contact her as soon as patients starts to develop any cold symptoms.   He plans to have a flu shot later this year.  His appetite is good. Denies night sweats, fever or chills. No weight loss.    MEDICAL HISTORY:  Past Medical History:  Diagnosis Date  . Allergy   . Arthritis   . Asthma   . Cancer ([place])    kidney  . CLL (chronic lymphocytic leukemia) ([place]) 2016  . COPD (chronic obstructive pulmonary disease) ([place])   . Diverticulitis   . Diverticulosis    colonoscopy 3/15  . GERD (gastroesophageal reflux disease)   . Hypertension   . Low testosterone   . Migraine   . Shingles 03/31/2016  . Solar  purpura ([place])   . Thyroid disease    hypothryoid  . Ulcer   . Ventral hernia FEB 2015 CT    Large infra umbilical ventral hernia containing loops of small    SURGICAL HISTORY: Past Surgical History:  Procedure Laterality Date  . abdominal mesh inserted  june 2006, may 2008,dec 2010  . CHOLECYSTECTOMY  june 2007  . COLON SURGERY  sept 2007  . COLONOSCOPY    . COLONOSCOPY N/A 02/20/2014   Procedure: COLONOSCOPY;  Surgeon: Danie Binder, MD;  Location: [facility]ITE;  Service: Endoscopy;  Laterality: N/A;  . HERNIA REPAIR  05/2005 5/20108, 11/2009   with mesh  . KIDNEY CYST REMOVAL  jan 2005   renal cell carcinoma  . knee minescus repair Left feb 2007  . NEPHRECTOMY      SOCIAL HISTORY: Social History   Social History  . Marital status: Married    Spouse name: N/A  . Number of children: N/A  . Years of education: N/A   Occupational History  . Not on file.   Social History Main Topics  . Smoking status: Former Smoker    Quit date: 12/06/1996  . Smokeless tobacco: Never Used  . Alcohol use Yes     Comment: a few shots a few times a week  . Drug use: No  . Sexual activity: Not on file   Other Topics Concern  . 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   He says he's been married 60 years, almost 58. He has 4 children and 6 grandchildren. No great-grandchildren yet.  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 He also cooks. He also worked in Public relations account executive.  He used to smoke. He quit about 17 years ago. He says he has a shot of whisky one or two nights a week, so he doesn't have problems with alcohol. He may, however, have a problem with soda -- Solic[place].  In terms of his hobbies, he jokes that he "likes to do nothing." He cooks. He says he's the "second best cook on this side  of the [place], but I'm not bragging." [place] food is his specialty, he says. He says he makes an excellent leg of lamb, and a great Moussaka. His wife makes a good Baklava. He also has a good [place] chicken recipe.  Why [place] food? When he was younger, he cooked and worked in [place], [place] with a family that was all [place].  They had a [place] festival coming up. So grandma came in and they cooked for the [place] -- maybe 2500 people.  She showed him each recipe and she was really tough. He says it really stuck with him ever since. He says when he gets his recipes, he gets them from families; family recipes. He says he's got about 3,000 of them. He says he doesn't give his recipes to anybody, but he'll make them. He says "I make the best pizza in the [place]." He says he makes a good soup, too. It's clear that he loves to cook very much.  FAMILY HISTORY: Family History  Problem Relation Age of Onset  . Cancer Mother     lung  . Colon cancer Neg Hx    indicated that his mother is deceased. He indicated that his father is deceased. He indicated that the status of his neg hx is unknown.     His biological father died of a stroke in bed; his mother died of brain cancer, metastasized breast cancer. They both died at 36. He says jokingly, "That's making me a little apprehensive, but that's okay." His mother died 17 years ago. He has a half brother and a half sister. His sister had juvenile diabetes; his brother's fine. No family history of CLL on his side that he knows of. They are familiar with CLL in general thanks to his wife's mother having it.  ALLERGIES:  is allergic to contrast media [iodinated diagnostic agents]; other; and ether.  MEDICATIONS:  Current Outpatient Prescriptions  Medication Sig Dispense Refill  . acetaminophen (TYLENOL) 325 MG tablet Take 650 mg by mouth every 6 (six) hours as needed.    Marland Kitchen albuterol (PROVENTIL) (2.5 MG/3ML) 0.083% nebulizer solution TAKE  3MLS(1 VIAL) VIA NEBULIZER EVERY 6 HOURS AS NEEDED FOR WHEEZING OR SHORTNESS OF BREATH 150 mL 0  . allopurinol (ZYLOPRIM) 100 MG tablet TAKE 1 TABLET(100 MG) BY MOUTH AT BEDTIME 90 tablet 0  . amLODipine (NORVASC) 5 MG tablet Take 1 tablet (5 mg total) by mouth daily. 90 tablet 3  . ATROVENT HFA 17 MCG/ACT inhaler INHALE 2 PUFFS BY MOUTH INTO THE LUNGS EVERY 6 HOURS 12.9 g 11  . calcium carbonate (TUMS - DOSED IN  MG ELEMENTAL CALCIUM) 500 MG chewable tablet Chew 2-3 tablets by mouth once as needed for indigestion or heartburn. Reported on 11/21/2015    . cholecalciferol (VITAMIN D) 1000 UNITS tablet Take 1,000 Units by mouth every morning.     . citalopram (CELEXA) 10 MG tablet Take 1 tablet (10 mg total) by mouth daily. 90 tablet 2  . dexlansoprazole (DEXILANT) 60 MG capsule Take 1 capsule (60 mg total) by mouth daily. 180 capsule 1  . diphenhydrAMINE (BENADRYL) 25 MG tablet Take 25 mg by mouth every evening.     . eletriptan (RELPAX) 20 MG tablet Take 1 tablet (20 mg total) by mouth as needed for migraine. One tablet by mouth at onset of headache. May repeat in 2 hours if headache persists or recurs. 10 tablet 3  . Glucosamine-Chondroit-Vit C-Mn (GLUCOSAMINE 1500 COMPLEX) CAPS Take 1,200 capsules by mouth 2 (two) times daily.    Marland Kitchen HYDROcodone-acetaminophen (NORCO) 10-325 MG tablet Take 1-2 tablets by mouth every 8 (eight) hours as needed. 120 tablet 0  . levalbuterol (XOPENEX HFA) 45 MCG/ACT inhaler Inhale 1-2 puffs into the lungs every 4 (four) hours as needed for wheezing. 1 Inhaler 6  . levofloxacin (LEVAQUIN) 500 MG tablet Take 1 tablet (500 mg total) by mouth daily. 7 tablet 0  . levothyroxine (SYNTHROID, LEVOTHROID) 50 MCG tablet Take 1 tablet (50 mcg total) by mouth daily before breakfast. 90 tablet 3  . loratadine (CLARITIN) 10 MG tablet Take 10 mg by mouth every morning.     . metoprolol (LOPRESSOR) 50 MG tablet TAKE 1 TABLET(50 MG) BY MOUTH TWICE DAILY 180 tablet 0  . mometasone (ASMANEX  60 METERED DOSES) 220 MCG/INH inhaler Inhale 2 puffs into the lungs 2 (two) times daily. 1 Inhaler 11  . Multiple Vitamin (MULTIVITAMIN WITH MINERALS) TABS tablet Take 1 tablet by mouth daily.    Marland Kitchen testosterone cypionate (DEPOTESTOSTERONE CYPIONATE) 200 MG/ML injection INJECT 1.5 MLS INTO THE MUSCLE EVERY 14 DAYS 10 mL 0  . vitamin B-12 (CYANOCOBALAMIN) 1000 MCG tablet Take 1,000 mcg by mouth every morning.     . vitamin C (ASCORBIC ACID) 500 MG tablet Take 500 mg by mouth 2 (two) times daily.    . vitamin E 400 UNIT capsule Take 400 Units by mouth every morning.      No current facility-administered medications for this visit.     Review of Systems  Constitutional: Negative for chills, fever, malaise/fatigue and weight loss.  HENT: Positive for hearing loss. Negative for congestion, nosebleeds, sore throat and tinnitus.   Eyes: Negative.  Negative for blurred vision, double vision, pain and discharge.  Respiratory: Negative.  Negative for cough, hemoptysis, sputum production, shortness of breath and wheezing.        Has COPD  Cardiovascular: Negative.  Negative for chest pain, palpitations, claudication, leg swelling and PND.  Gastrointestinal: Negative.  Negative for abdominal pain, blood in stool, constipation, diarrhea, heartburn, melena, nausea and vomiting.  Genitourinary: Negative.  Negative for dysuria, frequency, hematuria and urgency.  Musculoskeletal: Negative.  Negative for falls, joint pain and myalgias.  Neurological: Negative.  Negative for dizziness, tingling, tremors, sensory change, speech change, focal weakness, seizures, loss of consciousness, weakness and headaches.  Endo/Heme/Allergies: Negative.  Does not bruise/bleed easily.  Psychiatric/Behavioral: Negative.  Negative for depression, memory loss, substance abuse and suicidal ideas. The patient is not nervous/anxious and does not have insomnia.   All other systems reviewed and are negative. 14 point ROS was done and is  otherwise as  detailed above or in HPI   PHYSICAL EXAMINATION: ECOG PERFORMANCE STATUS: 1 - Symptomatic but completely ambulatory  Vitals with BMI 08/05/2016  Height   Weight 266 lbs 13 oz  BMI   Systolic 123456  Diastolic 78  Pulse 71  Respirations 20    Physical Exam  Constitutional: He is oriented to person, place, and time and well-developed, well-nourished, and in no distress.  HENT:  Head: Normocephalic and atraumatic.  Nose: Nose normal.  Mouth/Throat: Oropharynx is clear and moist. No oropharyngeal exudate.  Eyes: Conjunctivae and EOM are normal. Pupils are equal, round, and reactive to light. Right eye exhibits no discharge. Left eye exhibits no discharge. No scleral icterus.  Neck: Normal range of motion. Neck supple. No tracheal deviation present. No thyromegaly present.  Cardiovascular: Normal rate, regular rhythm and normal heart sounds.  Exam reveals no gallop and no friction rub.   No murmur heard. Pulmonary/Chest: Effort normal and breath sounds normal. He has no wheezes. He has no rales.  Abdominal: Soft. Bowel sounds are normal. He exhibits no distension and no mass. There is no tenderness. There is no rebound and no guarding.  Musculoskeletal: Normal range of motion. He exhibits no edema.  Lymphadenopathy:    He has no cervical adenopathy.  Neurological: He is alert and oriented to person, place, and time. He has normal reflexes. No cranial nerve deficit. Gait normal. Coordination normal.  Skin: Skin is warm and dry.  Psychiatric: Mood, memory, affect and judgment normal.  Nursing note and vitals reviewed.  LABORATORY DATA:  I have reviewed the data as listed Lab Results  Component Value Date   WBC 48.2 (H) 08/05/2016   HGB 15.6 08/05/2016   HCT 50.0 08/05/2016   MCV 86.7 08/05/2016   PLT 185 08/05/2016   CMP     Component Value Date/Time   NA 139 08/05/2016 1312   NA 138 01/31/2013   K 4.5 08/05/2016 1312   CL 105 08/05/2016 1312   CO2 25 08/05/2016  1312   GLUCOSE 117 (H) 08/05/2016 1312   BUN 14 08/05/2016 1312   BUN 19 01/31/2013   CREATININE 1.61 (H) 08/05/2016 1312   CREATININE 1.68 (H) September 02, 202017 1116   CALCIUM 9.0 08/05/2016 1312   PROT 6.8 08/05/2016 1312   ALBUMIN 4.7 08/05/2016 1312   AST 24 08/05/2016 1312   ALT 22 08/05/2016 1312   ALKPHOS 40 08/05/2016 1312   BILITOT 0.9 08/05/2016 1312   GFRNONAA 43 (L) 08/05/2016 1312   GFRNONAA 46 (L) 04/03/2015 1550   GFRAA 50 (L) 08/05/2016 1312   GFRAA 53 (L) 04/03/2015 1550   Results for [name] Ball, [name] Ball (MRN [id number]) as of 08/15/2016 20:05  Ref. Range 02/26/2016 09:00 05/04/2016 13:05 05/09/2016 11:16 06/23/2016 11:26 08/05/2016 13:12  WBC Latest Ref Range: 4.0 - 10.5 K/uL 29.3 (H) 26.8 (H) 51.8 (H) 30.7 (H) 48.2 (H)       RADIOLOGY: I have personally reviewed the radiological images as listed and agreed with the findings in the report.  CLINICAL DATA: Right-sided lymphadenopathy. Palpable knot on the right neck. Recent diagnosis of lymphocytic leukemia.  EXAM: CT NECK WITHOUT CONTRAST  TECHNIQUE: Multidetector CT imaging of the neck was performed following the standard protocol without intravenous contrast.  COMPARISON: None.  FINDINGS: Pharynx and larynx: No focal mucosal or submucosal lesions are present. Vocal cords are midline and symmetric. There is mild prominence of the adenoid tissue. Fullness is noted in the lingual tonsils. No discrete lesion is evident.  Salivary glands:  Intra parotid lymph nodes are present bilaterally. The parotid and submandibular glands are otherwise normal.  Thyroid: Negative  Lymph nodes: Multiple bilateral ovoid lymph nodes are present in both sides the neck. These are not pathologically enlarged but the number is significantly increased over expectation.  Vascular: Atherosclerotic changes are present at the aorta and branch vessels. Minimal calcifications are present at the carotid bifurcations bilaterally  without significant stenosis.  Limited intracranial: Unremarkable.  Visualized orbits: Within normal limits  Mastoids and visualized paranasal sinuses: The paranasal sinuses are clear. A left mastoid and middle ear effusion is present. No obstructing nasopharyngeal lesion is present.  Skeleton: Bone windows demonstrate reversal of the normal cervical lordosis. There is chronic loss of disc height at C4-5 and C5-6. Uncovertebral spurring is most noted at these 2 levels. The patient is edentulous. No focal lytic or blastic lesions are present.  Upper chest: A 12 x 11 mm right upper lobe pulmonary nodule is present. This is well-defined without spiculation. No other focal nodule, mass, or airspace disease is present. Sub cm right peritracheal lymph nodes are noted.  IMPRESSION: 1. Multiple sub cm ovoid lymph nodes within the neck bilaterally. Although no focally enlarged nodes are present, these could be related to the recent diagnosis of leukemia. 2. No focal mass lesion subjacent to the area marked. This is near the right submandibular gland. 3. Advanced spondylosis in the cervical spine. 4. Atherosclerosis.   Electronically Signed  By: San Morelle M.D.  On: 01/02/2016 16:49   ASSESSMENT & PLAN:  CLL Hypogammaglobulenemia Normal hemoglobin, HCT, PLT count History of RCC status post S/P nephrectomy, no medical records available CKD, Stage III Shingles   He received several doses of IVIG but it was discontinued secondary to cost. He is currently doing well. He has had a recent shingles outbreak but had this promptly treated.   I personally reviewed and went over laboratory studies with the patient and his wife. His counts are essentially stable, H/H and platelet count are WNL. We will continue to monitor WBC count. No indications for treatment.   I recommended he get the flu shot.  He follows regularly with his PCP Dr. Buelah Manis.  He will return for  follow up in 4 months with repeat labs.   Orders Placed This Encounter  Procedures  . CBC with Differential    Standing Status:   Future    Standing Expiration Date:   08/05/2017  . Comprehensive metabolic panel    Standing Status:   Future    Standing Expiration Date:   08/05/2017  . Lactate dehydrogenase    Standing Status:   Future    Standing Expiration Date:   08/05/2017  . IgG, IgA, IgM    Standing Status:   Future    Standing Expiration Date:   08/05/2017   All questions were answered. The patient knows to call the clinic with any problems, questions or concerns.  This document serves as a record of services personally performed by Ancil Linsey, MD. It was created on her behalf by Elmyra Ricks, a trained medical scribe. The creation of this record is based on the scribe's personal observations and the provider's statements to them. This document has been checked and approved by the attending provider.  I have reviewed the above documentation for accuracy and completeness, and I agree with the above.  This note was electronically signed.    Molli Hazard, MD

## 2016-08-06 DIAGNOSIS — J449 Chronic obstructive pulmonary disease, unspecified: Secondary | ICD-10-CM | POA: Diagnosis not present

## 2016-08-10 ENCOUNTER — Encounter (HOSPITAL_COMMUNITY): Payer: Self-pay | Admitting: Hematology & Oncology

## 2016-08-11 ENCOUNTER — Encounter (HOSPITAL_COMMUNITY): Payer: Self-pay | Admitting: Hematology & Oncology

## 2016-08-13 ENCOUNTER — Other Ambulatory Visit (HOSPITAL_COMMUNITY): Payer: Self-pay | Admitting: *Deleted

## 2016-08-13 DIAGNOSIS — C9110 Chronic lymphocytic leukemia of B-cell type not having achieved remission: Secondary | ICD-10-CM

## 2016-08-15 ENCOUNTER — Encounter (HOSPITAL_COMMUNITY): Payer: Self-pay | Admitting: Hematology & Oncology

## 2016-08-27 ENCOUNTER — Telehealth: Payer: Self-pay | Admitting: *Deleted

## 2016-08-27 MED ORDER — HYDROCODONE-ACETAMINOPHEN 10-325 MG PO TABS: 1.0000 | ORAL_TABLET | Freq: Three times a day (TID) | ORAL | 0 refills | Status: DC | PRN

## 2016-08-27 NOTE — Telephone Encounter (Signed)
Prescription printed.   Patient and wife will be in office on 08/31/2016 to get flu shots and will pick up then.

## 2016-08-27 NOTE — Telephone Encounter (Signed)
Patient wife in office requesting refill on Hydrocodone.   Ok to refill??  Last office visit 2020-06-3016.  Last refill 07/28/2016.

## 2016-08-27 NOTE — Telephone Encounter (Signed)
okay

## 2016-08-30 ENCOUNTER — Other Ambulatory Visit: Payer: Self-pay | Admitting: Family Medicine

## 2016-08-31 ENCOUNTER — Ambulatory Visit (INDEPENDENT_AMBULATORY_CARE_PROVIDER_SITE_OTHER): Payer: Medicare Other

## 2016-08-31 DIAGNOSIS — Z23 Encounter for immunization: Secondary | ICD-10-CM | POA: Diagnosis not present

## 2016-09-08 DIAGNOSIS — H52223 Regular astigmatism, bilateral: Secondary | ICD-10-CM | POA: Diagnosis not present

## 2016-09-08 DIAGNOSIS — H5203 Hypermetropia, bilateral: Secondary | ICD-10-CM | POA: Diagnosis not present

## 2016-09-08 DIAGNOSIS — H524 Presbyopia: Secondary | ICD-10-CM | POA: Diagnosis not present

## 2016-10-04 ENCOUNTER — Encounter: Payer: Self-pay | Admitting: Family Medicine

## 2016-10-04 MED ORDER — HYDROCODONE-ACETAMINOPHEN 10-325 MG PO TABS: 1.0000 | ORAL_TABLET | Freq: Three times a day (TID) | ORAL | 0 refills | Status: DC | PRN

## 2016-10-05 ENCOUNTER — Encounter (HOSPITAL_COMMUNITY): Payer: Medicare Other | Attending: Hematology & Oncology

## 2016-10-05 DIAGNOSIS — C9110 Chronic lymphocytic leukemia of B-cell type not having achieved remission: Secondary | ICD-10-CM | POA: Diagnosis not present

## 2016-10-05 LAB — CBC WITH DIFFERENTIAL/PLATELET
BAND NEUTROPHILS: 0 %
BASOS PCT: 0 %
Basophils Absolute: 0 10*3/uL (ref 0.0–0.1)
Blasts: 0 %
EOS ABS: 0.4 10*3/uL (ref 0.0–0.7)
EOS PCT: 1 %
HCT: 49.3 % (ref 39.0–52.0)
Hemoglobin: 15.9 g/dL (ref 13.0–17.0)
LYMPHS ABS: 34.8 10*3/uL — AB (ref 0.7–4.0)
Lymphocytes Relative: 78 %
MCH: 28.5 pg (ref 26.0–34.0)
MCHC: 32.3 g/dL (ref 30.0–36.0)
MCV: 88.5 fL (ref 78.0–100.0)
METAMYELOCYTES PCT: 0 %
MONO ABS: 4.5 10*3/uL — AB (ref 0.1–1.0)
MONOS PCT: 10 %
MYELOCYTES: 0 %
Neutro Abs: 4.9 10*3/uL (ref 1.7–7.7)
Neutrophils Relative %: 11 %
PLATELETS: 149 10*3/uL — AB (ref 150–400)
Promyelocytes Absolute: 0 %
RBC: 5.57 MIL/uL (ref 4.22–5.81)
RDW: 15.8 % — ABNORMAL HIGH (ref 11.5–15.5)
WBC: 44.6 10*3/uL — ABNORMAL HIGH (ref 4.0–10.5)
nRBC: 0 /100 WBC

## 2016-10-13 ENCOUNTER — Other Ambulatory Visit: Payer: Self-pay | Admitting: Family Medicine

## 2016-10-21 ENCOUNTER — Other Ambulatory Visit: Payer: Self-pay | Admitting: Family Medicine

## 2016-11-01 ENCOUNTER — Encounter: Payer: Self-pay | Admitting: Family Medicine

## 2016-11-01 MED ORDER — HYDROCODONE-ACETAMINOPHEN 10-325 MG PO TABS: 1.0000 | ORAL_TABLET | Freq: Three times a day (TID) | ORAL | 0 refills | Status: DC | PRN

## 2016-11-02 ENCOUNTER — Telehealth: Payer: Self-pay | Admitting: *Deleted

## 2016-11-02 MED ORDER — PREDNISONE 20 MG PO TABS: ORAL_TABLET | ORAL | 0 refills | Status: DC

## 2016-11-02 MED ORDER — LEVOFLOXACIN 500 MG PO TABS: 500.0000 mg | ORAL_TABLET | Freq: Every day | ORAL | 0 refills | Status: DC

## 2016-11-02 NOTE — Telephone Encounter (Signed)
Send over levaquin 500mg  x 5 days  and prednisone taper, have them hold office, on antibiotics if he gets fever then start. If he has wheezing then go ahead and start prednisone

## 2016-11-02 NOTE — Telephone Encounter (Signed)
Call placed to patient and patient wife made aware.   Prescription sent to pharmacy.  

## 2016-11-02 NOTE — Telephone Encounter (Signed)
Received call from patient wife, [name] Ball.   Reports that patient has had cold x2 days. States that he woke up today voicing c/o cough and chest congestion. Denies fever at this time.   Reports that PCP did not want to wait for severe S/Sx of illness before treating.   MD please advise.

## 2016-11-05 ENCOUNTER — Other Ambulatory Visit: Payer: Self-pay | Admitting: Family Medicine

## 2016-11-05 NOTE — Telephone Encounter (Signed)
Noted.  Refill denied.

## 2016-11-05 NOTE — Telephone Encounter (Signed)
Ok to refill 

## 2016-11-05 NOTE — Telephone Encounter (Signed)
Last lab to monitor this was 11/04/15. Needs OV and lab.

## 2016-11-06 DIAGNOSIS — G4733 Obstructive sleep apnea (adult) (pediatric): Secondary | ICD-10-CM | POA: Diagnosis not present

## 2016-11-06 DIAGNOSIS — J449 Chronic obstructive pulmonary disease, unspecified: Secondary | ICD-10-CM | POA: Diagnosis not present

## 2016-11-07 ENCOUNTER — Encounter: Payer: Self-pay | Admitting: Family Medicine

## 2016-11-07 ENCOUNTER — Other Ambulatory Visit: Payer: Self-pay | Admitting: Family Medicine

## 2016-11-10 ENCOUNTER — Ambulatory Visit (INDEPENDENT_AMBULATORY_CARE_PROVIDER_SITE_OTHER): Payer: Medicare Other | Admitting: Family Medicine

## 2016-11-10 ENCOUNTER — Encounter: Payer: Self-pay | Admitting: Family Medicine

## 2016-11-10 VITALS — BP 142/78 | HR 72 | Temp 98.0°F | Resp 20 | Ht 66.5 in | Wt 266.0 lb

## 2016-11-10 DIAGNOSIS — E349 Endocrine disorder, unspecified: Secondary | ICD-10-CM

## 2016-11-10 DIAGNOSIS — E038 Other specified hypothyroidism: Secondary | ICD-10-CM | POA: Diagnosis not present

## 2016-11-10 DIAGNOSIS — J4489 Other specified chronic obstructive pulmonary disease: Secondary | ICD-10-CM

## 2016-11-10 DIAGNOSIS — N183 Chronic kidney disease, stage 3 unspecified: Secondary | ICD-10-CM

## 2016-11-10 DIAGNOSIS — G894 Chronic pain syndrome: Secondary | ICD-10-CM | POA: Diagnosis not present

## 2016-11-10 DIAGNOSIS — Z125 Encounter for screening for malignant neoplasm of prostate: Secondary | ICD-10-CM | POA: Diagnosis not present

## 2016-11-10 DIAGNOSIS — J449 Chronic obstructive pulmonary disease, unspecified: Secondary | ICD-10-CM | POA: Diagnosis not present

## 2016-11-10 DIAGNOSIS — R7303 Prediabetes: Secondary | ICD-10-CM

## 2016-11-10 DIAGNOSIS — G4733 Obstructive sleep apnea (adult) (pediatric): Secondary | ICD-10-CM | POA: Diagnosis not present

## 2016-11-10 DIAGNOSIS — R7989 Other specified abnormal findings of blood chemistry: Secondary | ICD-10-CM

## 2016-11-10 DIAGNOSIS — C9110 Chronic lymphocytic leukemia of B-cell type not having achieved remission: Secondary | ICD-10-CM

## 2016-11-10 DIAGNOSIS — R079 Chest pain, unspecified: Secondary | ICD-10-CM | POA: Diagnosis not present

## 2016-11-10 LAB — COMPREHENSIVE METABOLIC PANEL
ALK PHOS: 33 U/L — AB (ref 40–115)
ALT: 12 U/L (ref 9–46)
AST: 12 U/L (ref 10–35)
Albumin: 3.9 g/dL (ref 3.6–5.1)
BUN: 20 mg/dL (ref 7–25)
CALCIUM: 8.7 mg/dL (ref 8.6–10.3)
CO2: 23 mmol/L (ref 20–31)
Chloride: 104 mmol/L (ref 98–110)
Creat: 1.54 mg/dL — ABNORMAL HIGH (ref 0.70–1.25)
GLUCOSE: 98 mg/dL (ref 70–99)
POTASSIUM: 4.1 mmol/L (ref 3.5–5.3)
Sodium: 138 mmol/L (ref 135–146)
Total Bilirubin: 0.6 mg/dL (ref 0.2–1.2)
Total Protein: 5.7 g/dL — ABNORMAL LOW (ref 6.1–8.1)

## 2016-11-10 LAB — LIPID PANEL
CHOLESTEROL: 138 mg/dL (ref <200)
HDL: 23 mg/dL — AB (ref >40)
LDL CALC: 56 mg/dL (ref <100)
TRIGLYCERIDES: 293 mg/dL — AB (ref <150)
Total CHOL/HDL Ratio: 6 Ratio — ABNORMAL HIGH (ref <5.0)
VLDL: 59 mg/dL — ABNORMAL HIGH (ref <30)

## 2016-11-10 LAB — TSH: TSH: 2.71 mIU/L (ref 0.40–4.50)

## 2016-11-10 LAB — PSA: PSA: 1.6 ng/mL (ref <=4.0)

## 2016-11-10 MED ORDER — FLUTICASONE FUROATE-VILANTEROL 100-25 MCG/INH IN AEPB: 1.0000 | INHALATION_SPRAY | Freq: Every day | RESPIRATORY_TRACT | 0 refills | Status: DC

## 2016-11-10 MED ORDER — LEVOFLOXACIN 500 MG PO TABS: 500.0000 mg | ORAL_TABLET | Freq: Every day | ORAL | 0 refills | Status: DC

## 2016-11-10 NOTE — Progress Notes (Signed)
Subjective:    Patient ID: [name] Ball, male    DOB: 1951-11-06, 65 y.o.   MRN: [id number]  Patient presents for Illness (chest pain, sore throat, ear pain, swollen glands, wheezing, cough, fatigue)   Patient here with cough congestion wheezing. He was actually treated with Levaquin for 5 days  and prednisone taper  (6 days)  when his wife sent a message on the 27th. He has underlying COPD as well as CLL. He has had frequent exacerbations. His inhalers including Atrovent nebs and Asmanex-Only uses once a day he uses BiPAP for his sleep apnea as well. He has tried advair and Symbicort. His cough and shortness of breath has improved he was doing well on Sunday but then yesterday started complaining of sore throat and left ear pain he felt lymph node pop up which he did have significant lymphadenopathy last year that we had to treat. He has not had any further fever eating and drinking well.    Review Of Systems:  GEN- + fatigue, fever, weight loss,weakness, recent illness HEENT- denies eye drainage, change in vision, +nasal discharge, CVS- denies chest pain, palpitations RESP- denies SOB,+ cough, wheeze ABD- denies N/V, change in stools, abd pain GU- denies dysuria, hematuria, dribbling, incontinence MSK- denies joint pain, muscle aches, injury Neuro- denies headache, dizziness, syncope, seizure activity       Objective:    BP (!) 142/78 (BP Location: Left Arm, Patient Position: Sitting, Cuff Size: Large)   Pulse 72   Temp 98 F (36.7 C) (Oral)   Resp 20   Ht 5' 6.5" (1.689 m)   Wt 266 lb (120.7 kg)   SpO2 96%   BMI 42.29 kg/m  GEN- NAD, alert and oriented x3 HEENT- PERRL, EOMI, non injected sclera, pink conjunctiva, MMM, oropharynx clear, nares clear rhinorrhea, Left canal with impaction, Right canal clear, TM clear  Neck- Supple, small shotty left LAD, no fluctance  CVS- RRR, few irregular beats, no murmur RESP-CTAB EXT- No edema Pulses- Radial 2+   EKG- NSR, FEW  PAC,baseline wandering      Assessment & Plan:      Problem List Items Addressed This Visit    Prediabetes    Check A1C      Relevant Orders   Hemoglobin A1c   Lipid panel   Low testosterone - Primary    On replacement last shot on 12/1 Check PSA as well for year      Relevant Orders   Testosterone   PSA   Hypothyroidism    Recheck TFT       Relevant Orders   TSH   COPD with asthma ([place])    COPD with asthma in setting of CLL Recent exacerbation. He is actually improved from the standpoint of the COPD still has a lymph node that is a little swollen. With his recent illness this is not surprising as well as he also has CLL. I have given a refill on the Levaquin for some reason the lymph node actually enlarges and he has fever he is to start taking this he has had this happen in the past. I will try him on BREO to see if this helps with compliance as well as his exacerbations.      Relevant Medications   fluticasone furoate-vilanterol (BREO ELLIPTA) 100-25 MCG/INH AEPB   CKD (chronic kidney disease), stage III   Relevant Orders   Comprehensive metabolic panel   CBC with Differential/Platelet   Chronic pain syndrome  Continues with Norco as needed for chronic pain also helps with chronic cough with his COPD       Chronic lymphocytic leukemia (CLL), B-cell ([place])    Other Visit Diagnoses    Chest pain, unspecified type       Relevant Orders   EKG 12-Lead (Completed)      Note: This dictation was prepared with Dragon dictation along with smaller phrase technology. Any transcriptional errors that result from this process are unintentional.

## 2016-11-10 NOTE — Patient Instructions (Signed)
Try the Breo Take antibiotics if lymph node enlarges We will call with lab results F/U 3 months

## 2016-11-10 NOTE — Assessment & Plan Note (Signed)
Recheck TFT 

## 2016-11-10 NOTE — Assessment & Plan Note (Signed)
Continues with Norco as needed for chronic pain also helps with chronic cough with his COPD

## 2016-11-10 NOTE — Assessment & Plan Note (Signed)
Check A1C 

## 2016-11-10 NOTE — Assessment & Plan Note (Signed)
COPD with asthma in setting of CLL Recent exacerbation. He is actually improved from the standpoint of the COPD still has a lymph node that is a little swollen. With his recent illness this is not surprising as well as he also has CLL. I have given a refill on the Levaquin for some reason the lymph node actually enlarges and he has fever he is to start taking this he has had this happen in the past. I will try him on BREO to see if this helps with compliance as well as his exacerbations.

## 2016-11-10 NOTE — Assessment & Plan Note (Signed)
On replacement last shot on 12/1 Check PSA as well for year

## 2016-11-11 LAB — CBC WITH DIFFERENTIAL/PLATELET
BASOS ABS: 0 {cells}/uL (ref 0–200)
BASOS PCT: 0 %
EOS ABS: 1410 {cells}/uL — AB (ref 15–500)
EOS PCT: 3 %
HCT: 46.9 % (ref 38.5–50.0)
HEMOGLOBIN: 15.2 g/dL (ref 13.0–17.0)
LYMPHS ABS: 28200 {cells}/uL — AB (ref 850–3900)
Lymphocytes Relative: 60 %
MCH: 28.4 pg (ref 27.0–33.0)
MCHC: 32.4 g/dL (ref 32.0–36.0)
MCV: 87.7 fL (ref 80.0–100.0)
MPV: 10.1 fL (ref 7.5–12.5)
Monocytes Absolute: 1880 cells/uL — ABNORMAL HIGH (ref 200–950)
Monocytes Relative: 4 %
NEUTROS ABS: 15510 {cells}/uL — AB (ref 1500–7800)
Neutrophils Relative %: 33 %
Platelets: 154 10*3/uL (ref 140–400)
RBC: 5.35 MIL/uL (ref 4.20–5.80)
RDW: 15.9 % — ABNORMAL HIGH (ref 11.0–15.0)
WBC: 47 10*3/uL — ABNORMAL HIGH (ref 3.8–10.8)

## 2016-11-11 LAB — TESTOSTERONE: Testosterone: 468 ng/dL (ref 250–827)

## 2016-11-11 LAB — HEMOGLOBIN A1C
HEMOGLOBIN A1C: 5.4 % (ref <5.7)
MEAN PLASMA GLUCOSE: 108 mg/dL

## 2016-11-11 LAB — PATHOLOGIST SMEAR REVIEW

## 2016-11-16 ENCOUNTER — Encounter: Payer: Self-pay | Admitting: Family Medicine

## 2016-11-17 MED ORDER — HYDROCODONE-ACETAMINOPHEN 10-325 MG PO TABS: 1.0000 | ORAL_TABLET | Freq: Three times a day (TID) | ORAL | 0 refills | Status: DC | PRN

## 2016-11-17 MED ORDER — TESTOSTERONE CYPIONATE 200 MG/ML IM SOLN: INTRAMUSCULAR | 0 refills | Status: DC

## 2016-11-30 ENCOUNTER — Other Ambulatory Visit: Payer: Self-pay | Admitting: Family Medicine

## 2016-12-07 ENCOUNTER — Encounter: Payer: Self-pay | Admitting: Family Medicine

## 2016-12-07 ENCOUNTER — Encounter: Payer: Self-pay | Admitting: *Deleted

## 2016-12-08 ENCOUNTER — Encounter (HOSPITAL_COMMUNITY): Payer: Medicare Other

## 2016-12-08 ENCOUNTER — Encounter (HOSPITAL_COMMUNITY): Payer: Medicare Other | Attending: Hematology & Oncology | Admitting: Hematology & Oncology

## 2016-12-08 ENCOUNTER — Encounter (HOSPITAL_COMMUNITY): Payer: Self-pay | Admitting: Hematology & Oncology

## 2016-12-08 VITALS — BP 162/72 | HR 71 | Temp 97.9°F | Resp 18 | Wt 255.3 lb

## 2016-12-08 DIAGNOSIS — C9110 Chronic lymphocytic leukemia of B-cell type not having achieved remission: Secondary | ICD-10-CM

## 2016-12-08 DIAGNOSIS — N183 Chronic kidney disease, stage 3 unspecified: Secondary | ICD-10-CM

## 2016-12-08 DIAGNOSIS — Z905 Acquired absence of kidney: Secondary | ICD-10-CM

## 2016-12-08 DIAGNOSIS — Z85528 Personal history of other malignant neoplasm of kidney: Secondary | ICD-10-CM | POA: Diagnosis not present

## 2016-12-08 DIAGNOSIS — D801 Nonfamilial hypogammaglobulinemia: Secondary | ICD-10-CM | POA: Diagnosis not present

## 2016-12-08 LAB — CBC WITH DIFFERENTIAL/PLATELET
BASOS PCT: 0 %
Band Neutrophils: 0 %
Basophils Absolute: 0 10*3/uL (ref 0.0–0.1)
Blasts: 0 %
EOS PCT: 0 %
Eosinophils Absolute: 0 10*3/uL (ref 0.0–0.7)
HCT: 50.8 % (ref 39.0–52.0)
Hemoglobin: 16 g/dL (ref 13.0–17.0)
LYMPHS ABS: 44.3 10*3/uL — AB (ref 0.7–4.0)
Lymphocytes Relative: 88 %
MCH: 28.2 pg (ref 26.0–34.0)
MCHC: 31.5 g/dL (ref 30.0–36.0)
MCV: 89.4 fL (ref 78.0–100.0)
METAMYELOCYTES PCT: 0 %
MONOS PCT: 1 %
Monocytes Absolute: 0.5 10*3/uL (ref 0.1–1.0)
Myelocytes: 0 %
NEUTROS ABS: 5.5 10*3/uL (ref 1.7–7.7)
NEUTROS PCT: 11 %
NRBC: 0 /100{WBCs}
Other: 0 %
PLATELETS: 252 10*3/uL (ref 150–400)
Promyelocytes Absolute: 0 %
RBC: 5.68 MIL/uL (ref 4.22–5.81)
RDW: 14.9 % (ref 11.5–15.5)
WBC: 50.3 10*3/uL — AB (ref 4.0–10.5)

## 2016-12-08 LAB — COMPREHENSIVE METABOLIC PANEL
ALT: 15 U/L — ABNORMAL LOW (ref 17–63)
ANION GAP: 4 — AB (ref 5–15)
AST: 16 U/L (ref 15–41)
Albumin: 4.3 g/dL (ref 3.5–5.0)
Alkaline Phosphatase: 44 U/L (ref 38–126)
BUN: 18 mg/dL (ref 6–20)
CHLORIDE: 105 mmol/L (ref 101–111)
CO2: 25 mmol/L (ref 22–32)
Calcium: 8.9 mg/dL (ref 8.9–10.3)
Creatinine, Ser: 1.56 mg/dL — ABNORMAL HIGH (ref 0.61–1.24)
GFR, EST AFRICAN AMERICAN: 52 mL/min — AB (ref >60)
GFR, EST NON AFRICAN AMERICAN: 45 mL/min — AB (ref >60)
Glucose, Bld: 114 mg/dL — ABNORMAL HIGH (ref 65–99)
POTASSIUM: 4.5 mmol/L (ref 3.5–5.1)
Sodium: 134 mmol/L — ABNORMAL LOW (ref 135–145)
Total Bilirubin: 0.8 mg/dL (ref 0.3–1.2)
Total Protein: 6.9 g/dL (ref 6.5–8.1)

## 2016-12-08 LAB — LACTATE DEHYDROGENASE: LDH: 127 U/L (ref 98–192)

## 2016-12-08 NOTE — Patient Instructions (Addendum)
[place] at [facility] Discharge Instructions  RECOMMENDATIONS MADE BY THE CONSULTANT AND ANY TEST RESULTS WILL BE SENT TO YOUR REFERRING PHYSICIAN.  You were seen today by Dr. Whitney Muse Follow up in 4 months with labs    Thank you for choosing [place] at [facility] to provide your oncology and hematology care.  To afford each patient quality time with our provider, please arrive at least 15 minutes before your scheduled appointment time.    If you have a lab appointment with the [facility] please come in thru the  Main Entrance and check in at the main information desk  You need to re-schedule your appointment should you arrive 10 or more minutes late.  We strive to give you quality time with our providers, and arriving late affects you and other patients whose appointments are after yours.  Also, if you no show three or more times for appointments you may be dismissed from the clinic at the providers discretion.     Again, thank you for choosing [facility].  Our hope is that these requests will decrease the amount of time that you wait before being seen by our physicians.       _____________________________________________________________  Should you have questions after your visit to [facility], please contact our office at [phone] between the hours of 8:30 a.m. and 4:30 p.m.  Voicemails left after 4:30 p.m. will not be returned until the following business day.  For prescription refill requests, have your pharmacy contact our office.       Resources For Cancer Patients and their Caregivers ? American Cancer Society: Can assist with transportation, wigs, general needs, runs Look Good Feel Better.        ([phone] ? Cancer Care: Provides financial assistance, online support groups, medication/co-pay assistance.  1-800-813-HOPE 713-026-9075) ? [place] Assists [place] cancer patients and their families through emotional , educational and financial support.  7801816976 ? [place] DSS Where to apply for food stamps, Medicaid and utility assistance. (628) 016-3954 ? [facility]: Transportation to medical appointments. 773-486-1477 ? Social Security Administration: May apply for disability if have a Stage IV cancer. 226-757-3424 (605)011-4663 ? [employer], Disability and Transit Services: Assists with nutrition, care and transit needs. [place] Support Programs: @10RELATIVEDAYS @ > Cancer Support Group  2nd Tuesday of the month 1pm-2pm, Journey Room  > Creative Journey  3rd Tuesday of the month 1130am-1pm, Journey Room  > Look Good Feel Better  1st Wednesday of the month 10am-12 noon, Journey Room (Call [place] to register [phone])

## 2016-12-08 NOTE — Progress Notes (Signed)
[place] at [place] Note  Patient Care Team: [name] Rossetti, MD as PCP - General (Family Medicine) [name] Records, MD as Consulting Physician (Cardiology)  CHIEF COMPLAINTS:  CLL Flow cytometry performed on 11/07/15 by Dr. Buelah Ball shows findings consistent with CLL/SLL CBC from 11/04/15 shows a total WBC of 25,000. Normal hemoglobin, HCT, PLT Stage III chronic kidney disease Jehovah's Witness Recurrent sinusitis, IVIG started 01/2016, discontinued secondary to cost Shingles  HISTORY OF PRESENTING ILLNESS:  [name] Ball 66 y.o. male is here for follow-up of CLL and hypogammaglobulinemia.   [name] Ball is accompanied by his wife.  I personally reviewed and went over laboratory studies with the patient and his wife. He is doing well and is now wearing new glasses. He also notes that he got new teeth.   Denies night sweats or any other complaints.   He is thinking of moving into a bigger house with his wife. He is staying active and feeling good. He still cooks a lot. He is eating well.   He was recently on an antibiotic for an infection, but he is doing well now.   His kidney cancer was removed in 2005 and they haven't done any scans osince. He avoids NSAIDs to prevent kidney damage since he only has one kidney left. No new pain.   MEDICAL HISTORY:  Past Medical History:  Diagnosis Date  . Allergy   . Arthritis   . Asthma   . Cancer ([place])    kidney  . CLL (chronic lymphocytic leukemia) ([place]) 2016  . COPD (chronic obstructive pulmonary disease) ([place])   . Diverticulitis   . Diverticulosis    colonoscopy 3/15  . GERD (gastroesophageal reflux disease)   . Hypertension   . Low testosterone   . Migraine   . Shingles 03/31/2016  . Solar purpura ([place])   . Thyroid disease    hypothryoid  . Ulcer ([place])   . Ventral hernia FEB 2015 CT    Large infra umbilical ventral hernia containing loops of small    SURGICAL HISTORY: Past Surgical History:   Procedure Laterality Date  . abdominal mesh inserted  june 2006, may 2008,dec 2010  . CHOLECYSTECTOMY  june 2007  . COLON SURGERY  sept 2007  . COLONOSCOPY    . COLONOSCOPY N/A 02/20/2014   Procedure: COLONOSCOPY;  Surgeon: [name] Binder, MD;  Location: [facility] ENDO SUITE;  Service: Endoscopy;  Laterality: N/A;  . HERNIA REPAIR  05/2005 5/20108, 11/2009   with mesh  . KIDNEY CYST REMOVAL  jan 2005   renal cell carcinoma  . knee minescus repair Left feb 2007  . NEPHRECTOMY      SOCIAL HISTORY: Social History   Social History  . Marital status: Married    Spouse name: N/A  . Number of children: N/A  . Years of education: N/A   Occupational History  . Not on file.   Social History Main Topics  . Smoking status: Former Smoker    Quit date: 12/06/1996  . Smokeless tobacco: Never Used  . Alcohol use Yes     Comment: a few shots a few times a week  . Drug use: No  . Sexual activity: Not on file   Other Topics Concern  . 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   He says he's been married 49 years, almost 77. He has 4 children and  6 grandchildren. No great-grandchildren yet.  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 He also cooks. He also worked in Public relations account executive.  He used to smoke. He quit about 17 years ago. He says he has a shot of whisky one or two nights a week, so he doesn't have problems with alcohol. He may, however, have a problem with soda -- Solicitor.  In terms of his hobbies, he jokes that he "likes to do nothing." He cooks. He says he's the "second best cook on this side of the [place], but I'm not bragging." [place] food is his specialty, he says. He says he makes an excellent leg of lamb, and a great Moussaka. His wife makes a good Baklava. He also has a good [place] chicken  recipe.  Why [place] food? When he was younger, he cooked and worked in [place], [place] with a family that was all [place].  They had a [place] festival coming up. So grandma came in and they cooked for the [place] -- maybe 2500 people.  She showed him each recipe and she was really tough. He says it really stuck with him ever since. He says when he gets his recipes, he gets them from families; family recipes. He says he's got about 3,000 of them. He says he doesn't give his recipes to anybody, but he'll make them. He says "I make the best pizza in the [place]." He says he makes a good soup, too. It's clear that he loves to cook very much.  FAMILY HISTORY: Family History  Problem Relation Age of Onset  . Cancer Mother     lung  . Colon cancer Neg Hx    indicated that his mother is deceased. He indicated that his father is deceased. He indicated that the status of his neg hx is unknown.     His biological father died of a stroke in bed; his mother died of brain cancer, metastasized breast cancer. They both died at 48. He says jokingly, "That's making me a little apprehensive, but that's okay." His mother died 36 years ago. He has a half brother and a half sister. His sister had juvenile diabetes; his brother's fine. No family history of CLL on his side that he knows of. They are familiar with CLL in general thanks to his wife's mother having it.  ALLERGIES:  is allergic to contrast media [iodinated diagnostic agents]; other; and ether.  MEDICATIONS:  Current Outpatient Prescriptions  Medication Sig Dispense Refill  . acetaminophen (TYLENOL) 325 MG tablet Take 650 mg by mouth every 6 (six) hours as needed.    Marland Kitchen albuterol (PROVENTIL) (2.5 MG/3ML) 0.083% nebulizer solution TAKE 3MLS(1 VIAL) VIA NEBULIZER EVERY 6 HOURS AS NEEDED FOR WHEEZING OR SHORTNESS OF BREATH 150 mL 0  . allopurinol (ZYLOPRIM) 100 MG tablet TAKE 1 TABLET(100 MG) BY MOUTH AT BEDTIME 90 tablet 0  . amLODipine  (NORVASC) 5 MG tablet Take 1 tablet (5 mg total) by mouth daily. 90 tablet 3  . ATROVENT HFA 17 MCG/ACT inhaler INHALE 2 PUFFS BY MOUTH INTO THE LUNGS EVERY 6 HOURS 12.9 g 0  . calcium carbonate (TUMS - DOSED IN MG ELEMENTAL CALCIUM) 500 MG chewable tablet Chew 2-3 tablets by mouth once as needed for indigestion or heartburn. Reported on 11/21/2015    . cholecalciferol (VITAMIN D) 1000 UNITS tablet  Take 1,000 Units by mouth every morning.     . citalopram (CELEXA) 10 MG tablet TAKE 1 TABLET(10 MG) BY MOUTH DAILY 90 tablet 0  . dexlansoprazole (DEXILANT) 60 MG capsule Take 1 capsule (60 mg total) by mouth daily. 180 capsule 1  . diphenhydrAMINE (BENADRYL) 25 MG tablet Take 25 mg by mouth every evening.     . eletriptan (RELPAX) 20 MG tablet Take 1 tablet (20 mg total) by mouth as needed for migraine. One tablet by mouth at onset of headache. May repeat in 2 hours if headache persists or recurs. 10 tablet 3  . fluticasone furoate-vilanterol (BREO ELLIPTA) 100-25 MCG/INH AEPB Inhale 1 puff into the lungs daily. 30 each 0  . Glucosamine-Chondroit-Vit C-Mn (GLUCOSAMINE 1500 COMPLEX) CAPS Take 1,200 capsules by mouth 2 (two) times daily.    Marland Kitchen HYDROcodone-acetaminophen (NORCO) 10-325 MG tablet Take 1-2 tablets by mouth every 8 (eight) hours as needed. 120 tablet 0  . levalbuterol (XOPENEX HFA) 45 MCG/ACT inhaler Inhale 1-2 puffs into the lungs every 4 (four) hours as needed for wheezing. 1 Inhaler 6  . levofloxacin (LEVAQUIN) 500 MG tablet Take 1 tablet (500 mg total) by mouth daily. 5 tablet 0  . loratadine (CLARITIN) 10 MG tablet Take 10 mg by mouth every morning.     . metoprolol (LOPRESSOR) 50 MG tablet TAKE 1 TABLET(50 MG) BY MOUTH TWICE DAILY 180 tablet 0  . mometasone (ASMANEX 60 METERED DOSES) 220 MCG/INH inhaler Inhale 2 puffs into the lungs 2 (two) times daily. 1 Inhaler 11  . Multiple Vitamin (MULTIVITAMIN WITH MINERALS) TABS tablet Take 1 tablet by mouth daily.    Marland Kitchen testosterone cypionate  (DEPOTESTOSTERONE CYPIONATE) 200 MG/ML injection INJECT 1.5 MLS INTO THE MUSCLE EVERY 14 DAYS 10 mL 0  . vitamin B-12 (CYANOCOBALAMIN) 1000 MCG tablet Take 1,000 mcg by mouth every morning.     . vitamin C (ASCORBIC ACID) 500 MG tablet Take 500 mg by mouth 2 (two) times daily.    . vitamin E 400 UNIT capsule Take 400 Units by mouth every morning.      No current facility-administered medications for this visit.     Review of Systems  Constitutional: Negative.        Neg night sweats Neg loss of appetite.   HENT: Negative.        Mostly deaf.   Eyes: Negative.   Respiratory:       Has COPD  Cardiovascular: Negative.   Gastrointestinal: Negative.   Genitourinary: Negative.   Musculoskeletal: Negative.   Skin: Negative.   Neurological: Negative.   Endo/Heme/Allergies: Negative.   Psychiatric/Behavioral: Negative.   All other systems reviewed and are negative. 14 point ROS was done and is otherwise as detailed above or in HPI   PHYSICAL EXAMINATION: ECOG PERFORMANCE STATUS: 1 - Symptomatic but completely ambulatory  Vitals with BMI 12/08/2016  Height   Weight 255 lbs 5 oz  BMI   Systolic 0000000  Diastolic 72  Pulse 71  Respirations 18    Physical Exam  Constitutional: He is oriented to person, place, and time and well-developed, well-nourished, and in no distress.  Pt was able to get on exam table without assistance.   HENT:  Head: Normocephalic and atraumatic.  Nose: Nose normal.  Mouth/Throat: Oropharynx is clear and moist. No oropharyngeal exudate.  Eyes: Conjunctivae and EOM are normal. Pupils are equal, round, and reactive to light. Right eye exhibits no discharge. Left eye exhibits no discharge. No scleral icterus.  Neck:  Normal range of motion. Neck supple. No tracheal deviation present. No thyromegaly present.  Cardiovascular: Normal rate, regular rhythm and normal heart sounds.  Exam reveals no gallop and no friction rub.   No murmur heard. Pulmonary/Chest: Effort  normal and breath sounds normal. He has no wheezes. He has no rales.  Abdominal: Soft. Bowel sounds are normal. He exhibits no distension and no mass. There is no tenderness. There is no rebound and no guarding.  Musculoskeletal: Normal range of motion. He exhibits no edema.  Lymphadenopathy:    He has no cervical adenopathy.  Neurological: He is alert and oriented to person, place, and time. He has normal reflexes. No cranial nerve deficit. Gait normal. Coordination normal.  Skin: Skin is warm and dry.  Psychiatric: Mood, memory, affect and judgment normal.  Nursing note and vitals reviewed.  LABORATORY DATA:  I have reviewed the data as listed Lab Results  Component Value Date   WBC 50.3 (HH) 12/08/2016   HGB 16.0 12/08/2016   HCT 50.8 12/08/2016   MCV 89.4 12/08/2016   PLT 252 12/08/2016   CMP     Component Value Date/Time   NA 134 (L) 12/08/2016 1313   NA 138 01/31/2013   K 4.5 12/08/2016 1313   CL 105 12/08/2016 1313   CO2 25 12/08/2016 1313   GLUCOSE 114 (H) 12/08/2016 1313   BUN 18 12/08/2016 1313   BUN 19 01/31/2013   CREATININE 1.56 (H) 12/08/2016 1313   CREATININE 1.54 (H) 11/10/2016 1057   CALCIUM 8.9 12/08/2016 1313   PROT 6.9 12/08/2016 1313   ALBUMIN 4.3 12/08/2016 1313   AST 16 12/08/2016 1313   ALT 15 (L) 12/08/2016 1313   ALKPHOS 44 12/08/2016 1313   BILITOT 0.8 12/08/2016 1313   GFRNONAA 45 (L) 12/08/2016 1313   GFRNONAA 46 (L) 04/03/2015 1550   GFRAA 52 (L) 12/08/2016 1313   GFRAA 53 (L) 04/03/2015 1550     RADIOLOGY: I have personally reviewed the radiological images as listed and agreed with the findings in the report.  Study Result   CLINICAL DATA:  Shortness of breath.  EXAM: CHEST  2 VIEW  COMPARISON:  Radiographs of February 18, 2014.  FINDINGS: The heart size and mediastinal contours are within normal limits. Both lungs are clear. No pneumothorax or significant pleural effusion is noted. The visualized skeletal structures  are unremarkable.  IMPRESSION: No active cardiopulmonary disease.   Electronically Signed   By: Marijo Conception, M.D.   On: 11-16-202017 13:57     ASSESSMENT & PLAN:  CLL Hypogammaglobulenemia Normal hemoglobin, HCT, PLT count History of RCC status post S/P nephrectomy, no medical Ball available CKD, Stage III Shingles   He received several doses of IVIG but it was discontinued secondary to cost. He is currently doing well.  I personally reviewed and went over laboratory studies with the patient and his wife. His counts are essentially stable, H/H and platelet count are WNL. We will continue to monitor WBC count. No indications for treatment.   He follows regularly with his PCP Dr. Buelah Ball.  He will return for follow up in 4 months with repeat labs.   Orders Placed This Encounter  Procedures  . Lactate dehydrogenase    Standing Status:   Future    Standing Expiration Date:   12/08/2017  . Comprehensive metabolic panel    Standing Status:   Future    Standing Expiration Date:   12/08/2017  . CBC with Differential    Standing  Status:   Future    Standing Expiration Date:   12/08/2017   All questions were answered. The patient knows to call the clinic with any problems, questions or concerns.  This document serves as a record of services personally performed by Ancil Linsey, MD. It was created on her behalf by Martinique Casey, a trained medical scribe. The creation of this record is based on the scribe's personal observations and the provider's statements to them. This document has been checked and approved by the attending provider.  I have reviewed the above documentation for accuracy and completeness, and I agree with the above.  This note was electronically signed.    Molli Hazard, MD

## 2016-12-08 NOTE — Progress Notes (Signed)
CRITICAL VALUE ALERT Critical value received:  WBC 50.3 Date of notification:  12/08/16 Time of notification: 1338 Critical value read back:  Yes.   Nurse who received alert:  Isidoro Donning RN Dr. Whitney Muse notified

## 2016-12-09 LAB — IGG, IGA, IGM
IGA: 162 mg/dL (ref 61–437)
IgG (Immunoglobin G), Serum: 462 mg/dL — ABNORMAL LOW (ref 700–1600)

## 2016-12-10 ENCOUNTER — Encounter (HOSPITAL_COMMUNITY): Payer: Self-pay | Admitting: Hematology & Oncology

## 2016-12-12 ENCOUNTER — Encounter (HOSPITAL_COMMUNITY): Payer: Self-pay | Admitting: Hematology & Oncology

## 2016-12-22 ENCOUNTER — Other Ambulatory Visit: Payer: Self-pay | Admitting: Family Medicine

## 2016-12-24 NOTE — Telephone Encounter (Signed)
Rx refilled per protocol 

## 2016-12-29 ENCOUNTER — Encounter: Payer: Self-pay | Admitting: Family Medicine

## 2016-12-29 MED ORDER — HYDROCODONE-ACETAMINOPHEN 10-325 MG PO TABS: 1.0000 | ORAL_TABLET | Freq: Three times a day (TID) | ORAL | 0 refills | Status: DC | PRN

## 2017-01-05 ENCOUNTER — Encounter: Payer: Self-pay | Admitting: Family Medicine

## 2017-01-06 ENCOUNTER — Other Ambulatory Visit: Payer: Self-pay | Admitting: Family Medicine

## 2017-01-07 ENCOUNTER — Encounter: Payer: Self-pay | Admitting: Family Medicine

## 2017-01-21 ENCOUNTER — Encounter: Payer: Self-pay | Admitting: Family Medicine

## 2017-01-24 MED ORDER — HYDROCODONE-ACETAMINOPHEN 10-325 MG PO TABS: 1.0000 | ORAL_TABLET | Freq: Three times a day (TID) | ORAL | 0 refills | Status: DC | PRN

## 2017-01-26 ENCOUNTER — Other Ambulatory Visit: Payer: Self-pay | Admitting: Family Medicine

## 2017-01-28 ENCOUNTER — Other Ambulatory Visit: Payer: Self-pay | Admitting: Family Medicine

## 2017-02-24 ENCOUNTER — Encounter: Payer: Self-pay | Admitting: Family Medicine

## 2017-02-25 MED ORDER — HYDROCODONE-ACETAMINOPHEN 10-325 MG PO TABS: 1.0000 | ORAL_TABLET | Freq: Three times a day (TID) | ORAL | 0 refills | Status: DC | PRN

## 2017-03-02 ENCOUNTER — Other Ambulatory Visit: Payer: Self-pay | Admitting: Family Medicine

## 2017-03-08 ENCOUNTER — Inpatient Hospital Stay (HOSPITAL_COMMUNITY)
Admission: EM | Admit: 2017-03-08 | Discharge: 2017-03-10 | DRG: 065 | Disposition: A | Discharge status: Home / Self Care | Payer: Medicare Other | Attending: Internal Medicine | Admitting: Internal Medicine

## 2017-03-08 ENCOUNTER — Emergency Department (HOSPITAL_COMMUNITY): Payer: Medicare Other

## 2017-03-08 ENCOUNTER — Encounter (HOSPITAL_COMMUNITY): Payer: Self-pay | Admitting: *Deleted

## 2017-03-08 DIAGNOSIS — Z905 Acquired absence of kidney: Secondary | ICD-10-CM | POA: Diagnosis not present

## 2017-03-08 DIAGNOSIS — Z7951 Long term (current) use of inhaled steroids: Secondary | ICD-10-CM | POA: Diagnosis not present

## 2017-03-08 DIAGNOSIS — J45909 Unspecified asthma, uncomplicated: Secondary | ICD-10-CM | POA: Diagnosis present

## 2017-03-08 DIAGNOSIS — N184 Chronic kidney disease, stage 4 (severe): Secondary | ICD-10-CM | POA: Diagnosis present

## 2017-03-08 DIAGNOSIS — R4781 Slurred speech: Secondary | ICD-10-CM | POA: Diagnosis not present

## 2017-03-08 DIAGNOSIS — Z884 Allergy status to anesthetic agent status: Secondary | ICD-10-CM | POA: Diagnosis not present

## 2017-03-08 DIAGNOSIS — Z79899 Other long term (current) drug therapy: Secondary | ICD-10-CM | POA: Diagnosis not present

## 2017-03-08 DIAGNOSIS — Z85528 Personal history of other malignant neoplasm of kidney: Secondary | ICD-10-CM | POA: Diagnosis not present

## 2017-03-08 DIAGNOSIS — Z888 Allergy status to other drugs, medicaments and biological substances status: Secondary | ICD-10-CM

## 2017-03-08 DIAGNOSIS — Z809 Family history of malignant neoplasm, unspecified: Secondary | ICD-10-CM

## 2017-03-08 DIAGNOSIS — Z801 Family history of malignant neoplasm of trachea, bronchus and lung: Secondary | ICD-10-CM | POA: Diagnosis not present

## 2017-03-08 DIAGNOSIS — C9110 Chronic lymphocytic leukemia of B-cell type not having achieved remission: Secondary | ICD-10-CM | POA: Diagnosis present

## 2017-03-08 DIAGNOSIS — R29818 Other symptoms and signs involving the nervous system: Secondary | ICD-10-CM | POA: Diagnosis not present

## 2017-03-08 DIAGNOSIS — M109 Gout, unspecified: Secondary | ICD-10-CM | POA: Diagnosis present

## 2017-03-08 DIAGNOSIS — Z91041 Radiographic dye allergy status: Secondary | ICD-10-CM | POA: Diagnosis not present

## 2017-03-08 DIAGNOSIS — I129 Hypertensive chronic kidney disease with stage 1 through stage 4 chronic kidney disease, or unspecified chronic kidney disease: Secondary | ICD-10-CM | POA: Diagnosis not present

## 2017-03-08 DIAGNOSIS — E039 Hypothyroidism, unspecified: Secondary | ICD-10-CM | POA: Diagnosis present

## 2017-03-08 DIAGNOSIS — K219 Gastro-esophageal reflux disease without esophagitis: Secondary | ICD-10-CM | POA: Diagnosis present

## 2017-03-08 DIAGNOSIS — H9190 Unspecified hearing loss, unspecified ear: Secondary | ICD-10-CM | POA: Diagnosis not present

## 2017-03-08 DIAGNOSIS — E669 Obesity, unspecified: Secondary | ICD-10-CM | POA: Diagnosis present

## 2017-03-08 DIAGNOSIS — F329 Major depressive disorder, single episode, unspecified: Secondary | ICD-10-CM | POA: Diagnosis present

## 2017-03-08 DIAGNOSIS — I10 Essential (primary) hypertension: Secondary | ICD-10-CM | POA: Diagnosis not present

## 2017-03-08 DIAGNOSIS — Z9049 Acquired absence of other specified parts of digestive tract: Secondary | ICD-10-CM | POA: Diagnosis not present

## 2017-03-08 DIAGNOSIS — R531 Weakness: Secondary | ICD-10-CM | POA: Diagnosis not present

## 2017-03-08 DIAGNOSIS — J449 Chronic obstructive pulmonary disease, unspecified: Secondary | ICD-10-CM | POA: Diagnosis not present

## 2017-03-08 DIAGNOSIS — R42 Dizziness and giddiness: Secondary | ICD-10-CM

## 2017-03-08 DIAGNOSIS — G43109 Migraine with aura, not intractable, without status migrainosus: Secondary | ICD-10-CM | POA: Diagnosis not present

## 2017-03-08 DIAGNOSIS — G4733 Obstructive sleep apnea (adult) (pediatric): Secondary | ICD-10-CM | POA: Diagnosis not present

## 2017-03-08 DIAGNOSIS — I639 Cerebral infarction, unspecified: Principal | ICD-10-CM | POA: Diagnosis present

## 2017-03-08 DIAGNOSIS — Z87891 Personal history of nicotine dependence: Secondary | ICD-10-CM

## 2017-03-08 DIAGNOSIS — G459 Transient cerebral ischemic attack, unspecified: Secondary | ICD-10-CM

## 2017-03-08 DIAGNOSIS — Z6841 Body Mass Index (BMI) 40.0 and over, adult: Secondary | ICD-10-CM

## 2017-03-08 LAB — I-STAT CHEM 8, ED
BUN: 16 mg/dL (ref 6–20)
CHLORIDE: 104 mmol/L (ref 101–111)
Calcium, Ion: 1.14 mmol/L — ABNORMAL LOW (ref 1.15–1.40)
Creatinine, Ser: 1.6 mg/dL — ABNORMAL HIGH (ref 0.61–1.24)
Glucose, Bld: 133 mg/dL — ABNORMAL HIGH (ref 65–99)
HEMATOCRIT: 52 % (ref 39.0–52.0)
Hemoglobin: 17.7 g/dL — ABNORMAL HIGH (ref 13.0–17.0)
POTASSIUM: 4.3 mmol/L (ref 3.5–5.1)
SODIUM: 139 mmol/L (ref 135–145)
TCO2: 25 mmol/L (ref 0–100)

## 2017-03-08 NOTE — ED Triage Notes (Signed)
Pt c/o pain and numbness to right side of head, face and arm with some nausea; pt states he laid down today and when he woke up around 1pm that is when he noticed the numbness and headache

## 2017-03-08 NOTE — ED Provider Notes (Signed)
[facility] DEPT Provider Note   CSN: [id number] Arrival date & time: 03/08/17  2326   By signing my name below, I, Hilbert Odor, attest that this documentation has been prepared under the direction and in the presence of Ezequiel Essex, MD. Electronically Signed: Hilbert Odor, Scribe. 03/09/17. 12:09 AM. History   Chief Complaint Chief Complaint  Patient presents with  . Numbness    LEVEL 5 CAVEAT: Acuity of Condition The history is provided by the patient and the spouse. No language interpreter was used.  HPI Comments: [name] Ball is a 66 y.o. male with a hx of COPD and hearing loss, who presents to the Emergency Department complaining of right sided numbness and weakness since 1 pm today. The patient states that he has been having episodes of right sided face and arm numbness for the past 4 to 5 weeks. The numbness usually goes away after a short period of time. Today the numbness has been constant and he also states that the numbness began to worsen around 3 pm. He also reports a headache and right-sided chest pain since the onset of his numbness. The chest pain has currently resolved. He states that he has had migraines in the past but this headache feels different. The patient had his left kidney removed in 2005 due to cancer. His right kidney has been functioning okay recently with his creatine noted to be slightly elevated per wife. He uses Bipap nightly or whenever he takes a nap. He is not on any blood thinners. The patient has never had a stent placed. He denies abdominal pain, back pain, nausea, vomiting, and any new visual changes.  Past Medical History:  Diagnosis Date  . Allergy   . Arthritis   . Asthma   . Cancer ([place])    kidney  . CLL (chronic lymphocytic leukemia) ([place]) 2016  . COPD (chronic obstructive pulmonary disease) ([place])   . Diverticulitis   . Diverticulosis    colonoscopy 3/15  . GERD (gastroesophageal reflux disease)   . Hypertension   .  Low testosterone   . Migraine   . Shingles 03/31/2016  . Solar purpura ([place])   . Thyroid disease    hypothryoid  . Ulcer ([place])   . Ventral hernia FEB 2015 CT    Large infra umbilical ventral hernia containing loops of small    Patient Active Problem List   Diagnosis Date Noted  . Hypogammaglobulinemia ([place]) 01/27/2016  . Chronic lymphocytic leukemia (CLL), B-cell ([place]) 11/10/2015  . Prediabetes 04/08/2015  . Depression 12/10/2014  . Medial epicondylitis of right elbow 12/10/2014  . Low testosterone 12/10/2014  . Essential hypertension, benign 08/09/2014  . Sleep apnea 08/09/2014  . Onychomycosis 08/09/2014  . Diverticulosis of colon with hemorrhage 02/20/2014  . GI bleed 02/18/2014  . Recurrent ventral incisional hernia 01/21/2014  . Abdominal wall hernia 11/19/2013  . Intertrigo 11/19/2013  . Hand dermatitis 09/18/2013  . Hypothyroidism 07/17/2013  . CKD (chronic kidney disease), stage III 07/17/2013  . Obesity, unspecified 07/17/2013  . Chronic pain syndrome 07/17/2013  . COPD with asthma ([place]) 07/17/2013  . GERD (gastroesophageal reflux disease) 07/17/2013    Past Surgical History:  Procedure Laterality Date  . abdominal mesh inserted  june 2006, may 2008,dec 2010  . CHOLECYSTECTOMY  june 2007  . COLON SURGERY  sept 2007  . COLONOSCOPY    . COLONOSCOPY N/A 02/20/2014   Procedure: COLONOSCOPY;  Surgeon: Danie Binder, MD;  Location: [facility];  Service: Endoscopy;  Laterality:  N/A;  . HERNIA REPAIR  05/2005 5/20108, 11/2009   with mesh  . KIDNEY CYST REMOVAL  jan 2005   renal cell carcinoma  . knee minescus repair Left feb 2007  . NEPHRECTOMY         Home Medications    Prior to Admission medications   Medication Sig Start Date End Date Taking? Authorizing Provider  acetaminophen (TYLENOL) 325 MG tablet Take 650 mg by mouth every 6 (six) hours as needed.    Historical Provider, MD  albuterol (PROVENTIL) (2.5 MG/3ML) 0.083% nebulizer solution TAKE  3MLS(1 VIAL) VIA NEBULIZER EVERY 6 HOURS AS NEEDED FOR WHEEZING OR SHORTNESS OF BREATH 11/08/16   Alycia Rossetti, MD  allopurinol (ZYLOPRIM) 100 MG tablet TAKE 1 TABLET(100 MG) BY MOUTH AT BEDTIME 03/02/17   Alycia Rossetti, MD  amLODipine (NORVASC) 5 MG tablet TAKE 1 TABLET(5 MG) BY MOUTH DAILY 12/24/16   Alycia Rossetti, MD  ATROVENT HFA 17 MCG/ACT inhaler INHALE 2 PUFFS BY MOUTH INTO THE LUNGS EVERY 6 HOURS 11/08/16   Alycia Rossetti, MD  calcium carbonate (TUMS - DOSED IN MG ELEMENTAL CALCIUM) 500 MG chewable tablet Chew 2-3 tablets by mouth once as needed for indigestion or heartburn. Reported on 11/21/2015    Historical Provider, MD  cholecalciferol (VITAMIN D) 1000 UNITS tablet Take 1,000 Units by mouth every morning.     Historical Provider, MD  citalopram (CELEXA) 10 MG tablet TAKE 1 TABLET(10 MG) BY MOUTH DAILY 01/26/17   Alycia Rossetti, MD  DEXILANT 60 MG capsule TAKE 1 CAPSULE BY MOUTH DAILY 01/28/17   Alycia Rossetti, MD  diphenhydrAMINE (BENADRYL) 25 MG tablet Take 25 mg by mouth every evening.     Historical Provider, MD  eletriptan (RELPAX) 20 MG tablet Take 1 tablet (20 mg total) by mouth as needed for migraine. One tablet by mouth at onset of headache. May repeat in 2 hours if headache persists or recurs. 01/16/14   Alycia Rossetti, MD  fluticasone furoate-vilanterol (BREO ELLIPTA) 100-25 MCG/INH AEPB Inhale 1 puff into the lungs daily. 11/10/16   Alycia Rossetti, MD  Glucosamine-Chondroit-Vit C-Mn (GLUCOSAMINE 1500 COMPLEX) CAPS Take 1,200 capsules by mouth 2 (two) times daily.    Historical Provider, MD  HYDROcodone-acetaminophen (NORCO) 10-325 MG tablet Take 1-2 tablets by mouth every 8 (eight) hours as needed. 02/25/17   Alycia Rossetti, MD  levalbuterol [facility] HFA) 45 MCG/ACT inhaler Inhale 1-2 puffs into the lungs every 4 (four) hours as needed for wheezing. 07/22/14   Alycia Rossetti, MD  levofloxacin (LEVAQUIN) 500 MG tablet Take 1 tablet (500 mg total) by mouth daily.  11/10/16   Alycia Rossetti, MD  levothyroxine (SYNTHROID, LEVOTHROID) 50 MCG tablet TAKE 1 TABLET(50 MCG) BY MOUTH DAILY BEFORE BREAKFAST 01/06/17   Alycia Rossetti, MD  loratadine (CLARITIN) 10 MG tablet Take 10 mg by mouth every morning.     Historical Provider, MD  metoprolol (LOPRESSOR) 50 MG tablet TAKE 1 TABLET(50 MG) BY MOUTH TWICE DAILY 03/02/17   Alycia Rossetti, MD  mometasone [facility] 60 METERED DOSES) 220 MCG/INH inhaler Inhale 2 puffs into the lungs 2 (two) times daily. 08/08/15   Alycia Rossetti, MD  Multiple Vitamin (MULTIVITAMIN WITH MINERALS) TABS tablet Take 1 tablet by mouth daily.    Historical Provider, MD  testosterone cypionate (DEPOTESTOSTERONE CYPIONATE) 200 MG/ML injection INJECT 1.5 MLS INTO THE MUSCLE EVERY 14 DAYS 11/17/16   Alycia Rossetti, MD  vitamin B-12 (CYANOCOBALAMIN) 1000  MCG tablet Take 1,000 mcg by mouth every morning.     Historical Provider, MD  vitamin C (ASCORBIC ACID) 500 MG tablet Take 500 mg by mouth 2 (two) times daily.    Historical Provider, MD  vitamin E 400 UNIT capsule Take 400 Units by mouth every morning.     Historical Provider, MD    Family History Family History  Problem Relation Age of Onset  . Cancer Mother     lung  . Colon cancer Neg Hx     Social History Social History  Substance Use Topics  . Smoking status: Former Smoker    Quit date: 12/06/1996  . Smokeless tobacco: Never Used  . Alcohol use Yes     Comment: a few shots a few times a week     Allergies   Contrast media [iodinated diagnostic agents]; Other; and Ether   Review of Systems Review of Systems  Unable to perform ROS: Acuity of condition   Physical Exam Updated Vital Signs BP (!) 173/77   Pulse 72   Temp 97.5 F (36.4 C) (Oral)   Resp (!) 21   Ht 5\' 6"  (1.676 m)   Wt 250 lb (113.4 kg)   SpO2 94%   BMI 40.35 kg/m   Physical Exam  Constitutional: He is oriented to person, place, and time. He appears well-developed and well-nourished. No distress.   Obese.  HENT:  Head: Normocephalic and atraumatic.  Mouth/Throat: Oropharynx is clear and moist. No oropharyngeal exudate.  Eyes: Conjunctivae and EOM are normal. Pupils are equal, round, and reactive to light.  Neck: Normal range of motion. Neck supple.  No meningismus.  Cardiovascular: Normal rate, regular rhythm, normal heart sounds and intact distal pulses.   No murmur heard. Pulmonary/Chest: Effort normal and breath sounds normal. No respiratory distress.  Lungs clear.  Abdominal: Soft. There is no tenderness. There is no rebound and no guarding.  Musculoskeletal: Normal range of motion. He exhibits no edema or tenderness.  Neurological: He is alert and oriented to person, place, and time. No cranial nerve deficit. He exhibits normal muscle tone. Coordination normal.  Cranial nerves 2 - 12 intact. Decreased strength on right with pronator drift. Decreased sensation to right face and arm. 5/5 strength to left bilateral arm and legs. No ataxia with finger to nose.  Skin: Skin is warm.  Psychiatric: He has a normal mood and affect. His behavior is normal.  Nursing note and vitals reviewed.   ED Treatments / Results  DIAGNOSTIC STUDIES: Oxygen Saturation is 95% on RA, adequate by my interpretation.    COORDINATION OF CARE: 11:49 PM Discussed treatment plan with pt at bedside and pt agreed to plan. I will check the patient's CT scan.  12:44 AM Reassessed. Patient is not experiencing any CP currently. I discussed with the patient that it would be best to place him in observation over night and consult neurology in the morning.  Labs (all labs ordered are listed, but only abnormal results are displayed) Labs Reviewed  CBC - Abnormal; Notable for the following:       Result Value   WBC 55.1 (*)    RDW 16.1 (*)    Platelets 146 (*)    All other components within normal limits  DIFFERENTIAL - Abnormal; Notable for the following:    Neutro Abs 9.4 (*)    Lymphs Abs 45.1 (*)     All other components within normal limits  COMPREHENSIVE METABOLIC PANEL - Abnormal; Notable for the  following:    Glucose, Bld 133 (*)    Creatinine, Ser 1.52 (*)    GFR calc non Af Amer 46 (*)    GFR calc Af Amer 53 (*)    All other components within normal limits  RAPID URINE DRUG SCREEN, HOSP PERFORMED - Abnormal; Notable for the following:    Opiates POSITIVE (*)    All other components within normal limits  CBC - Abnormal; Notable for the following:    WBC 45.2 (*)    RDW 16.1 (*)    Platelets 136 (*)    All other components within normal limits  I-STAT CHEM 8, ED - Abnormal; Notable for the following:    Creatinine, Ser 1.60 (*)    Glucose, Bld 133 (*)    Calcium, Ion 1.14 (*)    Hemoglobin 17.7 (*)    All other components within normal limits  MRSA PCR SCREENING  ETHANOL  PROTIME-INR  APTT  URINALYSIS, ROUTINE W REFLEX MICROSCOPIC  TSH  I-STAT TROPOININ, ED  CBG MONITORING, ED  I-STAT CHEM 8, ED    EKG  EKG Interpretation  Date/Time:  Tuesday March 08 2017 23:39:41 EDT Ventricular Rate:  75 PR Interval:    QRS Duration: 99 QT Interval:  362 QTC Calculation: 405 R Axis:   33 Text Interpretation:  Sinus rhythm No previous ECGs available Confirmed by Wyvonnia Dusky  MD, Tyshun Tuckerman [phone]) on 03/08/2017 11:51:03 PM       Radiology Ct Head Code Stroke W/o Cm  Result Date: 03/09/2017 CLINICAL DATA:  Code stroke.  66 y/o  M; right-sided weakness. EXAM: CT HEAD WITHOUT CONTRAST TECHNIQUE: Contiguous axial images were obtained from the base of the skull through the vertex without intravenous contrast. COMPARISON:  None. FINDINGS: Brain: No evidence of acute infarction, hemorrhage, hydrocephalus, extra-axial collection or mass lesion/mass effect. Vascular: Moderate calcific atherosclerosis of cavernous and paraclinoid internal carotid arteries. Skull: Well-circumscribed right occipital lucent lesion probably representing an arachnoid granulation. No displaced calvarial fracture.  Sinuses/Orbits: Mild right-sided maxillary sinus mucosal thickening. Partial opacification of the left mastoid air cells with sclerosis probably representing sequelae of chronic otomastoiditis. Other: 6 x 18 mm right occipital scalp lipoma. ASPECTS [facility] Stroke Program Early CT Score) - Ganglionic level infarction (caudate, lentiform nuclei, internal capsule, insula, M1-M3 cortex): 7 - Supraganglionic infarction (M4-M6 cortex): 3 Total score (0-10 with 10 being normal): 10 IMPRESSION: 1. No acute intracranial abnormality identified. If symptoms persist or if clinically indicated MRI is more sensitive for acute stroke. 2. ASPECTS is 10 These results were called by telephone at the time of interpretation on 03/09/2017 at 12:03 am to Dr. Ezequiel Essex , who verbally acknowledged these results. Electronically Signed   By: Kristine Garbe M.D.   On: 03/09/2017 00:03    Procedures Procedures (including critical care time)  Medications Ordered in ED Medications - No data to display   Initial Impression / Assessment and Plan / ED Course  I have reviewed the triage vital signs and the nursing notes.  Pertinent labs & imaging results that were available during my care of the patient were reviewed by me and considered in my medical decision making (see chart for details).     Patient presents with right arm numbness and right facial numbness onset at 1 PM. Has had progressively worsening weakness in his right arm. Associated with headache. Denies any difficulty speaking or swallowing. Denies any weakness in his leg.  Code stroke on arrival. Last seen normal was 1 PM. Patient not  TPA candidate due to delayed presentation. CT head is negative.  Patient does have right-sided numbness and weakness in right arm drift. NIH scale was 3.  Discussed with neurology Dr. Kathrin Penner who feels that patient has had a small cortical infarct Recommends admission for MRI. Does not feel patient has a large vessel  occlusion.  Given migraine is also considered. Patient has chronic leukocytosis from his CLL which is unchanged.  Admission for stroke workup discussed with Dr. Marin Comment.  CRITICAL CARE Performed by: Ezequiel Essex Total critical care time: 35 minutes Critical care time was exclusive of separately billable procedures and treating other patients. Critical care was necessary to treat or prevent imminent or life-threatening deterioration. Critical care was time spent personally by me on the following activities: development of treatment plan with patient and/or surrogate as well as nursing, discussions with consultants, evaluation of patient's response to treatment, examination of patient, obtaining history from patient or surrogate, ordering and performing treatments and interventions, ordering and review of laboratory studies, ordering and review of radiographic studies, pulse oximetry and re-evaluation of patient's condition.   Final Clinical Impressions(s) / ED Diagnoses   Final diagnoses:  Right sided weakness    New Prescriptions New Prescriptions   No medications on file   I personally performed the services described in this documentation, which was scribed in my presence. The recorded information has been reviewed and is accurate.    Ezequiel Essex, MD 03/09/17 [phone]

## 2017-03-09 ENCOUNTER — Observation Stay (HOSPITAL_COMMUNITY): Payer: Medicare Other

## 2017-03-09 DIAGNOSIS — I6523 Occlusion and stenosis of bilateral carotid arteries: Secondary | ICD-10-CM | POA: Diagnosis not present

## 2017-03-09 DIAGNOSIS — Z79899 Other long term (current) drug therapy: Secondary | ICD-10-CM | POA: Diagnosis not present

## 2017-03-09 DIAGNOSIS — I129 Hypertensive chronic kidney disease with stage 1 through stage 4 chronic kidney disease, or unspecified chronic kidney disease: Secondary | ICD-10-CM | POA: Diagnosis present

## 2017-03-09 DIAGNOSIS — G43109 Migraine with aura, not intractable, without status migrainosus: Secondary | ICD-10-CM | POA: Diagnosis not present

## 2017-03-09 DIAGNOSIS — Z884 Allergy status to anesthetic agent status: Secondary | ICD-10-CM | POA: Diagnosis not present

## 2017-03-09 DIAGNOSIS — R51 Headache: Secondary | ICD-10-CM | POA: Diagnosis not present

## 2017-03-09 DIAGNOSIS — J45909 Unspecified asthma, uncomplicated: Secondary | ICD-10-CM | POA: Diagnosis present

## 2017-03-09 DIAGNOSIS — H9190 Unspecified hearing loss, unspecified ear: Secondary | ICD-10-CM | POA: Diagnosis present

## 2017-03-09 DIAGNOSIS — R569 Unspecified convulsions: Secondary | ICD-10-CM | POA: Diagnosis not present

## 2017-03-09 DIAGNOSIS — J449 Chronic obstructive pulmonary disease, unspecified: Secondary | ICD-10-CM | POA: Diagnosis present

## 2017-03-09 DIAGNOSIS — Z905 Acquired absence of kidney: Secondary | ICD-10-CM | POA: Diagnosis not present

## 2017-03-09 DIAGNOSIS — C9110 Chronic lymphocytic leukemia of B-cell type not having achieved remission: Secondary | ICD-10-CM | POA: Diagnosis present

## 2017-03-09 DIAGNOSIS — R42 Dizziness and giddiness: Secondary | ICD-10-CM | POA: Diagnosis not present

## 2017-03-09 DIAGNOSIS — Z9049 Acquired absence of other specified parts of digestive tract: Secondary | ICD-10-CM | POA: Diagnosis not present

## 2017-03-09 DIAGNOSIS — F329 Major depressive disorder, single episode, unspecified: Secondary | ICD-10-CM | POA: Diagnosis present

## 2017-03-09 DIAGNOSIS — I509 Heart failure, unspecified: Secondary | ICD-10-CM | POA: Diagnosis not present

## 2017-03-09 DIAGNOSIS — Z6841 Body Mass Index (BMI) 40.0 and over, adult: Secondary | ICD-10-CM | POA: Diagnosis not present

## 2017-03-09 DIAGNOSIS — K219 Gastro-esophageal reflux disease without esophagitis: Secondary | ICD-10-CM | POA: Diagnosis present

## 2017-03-09 DIAGNOSIS — R531 Weakness: Secondary | ICD-10-CM | POA: Diagnosis present

## 2017-03-09 DIAGNOSIS — R4781 Slurred speech: Secondary | ICD-10-CM | POA: Diagnosis present

## 2017-03-09 DIAGNOSIS — I639 Cerebral infarction, unspecified: Secondary | ICD-10-CM | POA: Diagnosis not present

## 2017-03-09 DIAGNOSIS — Z809 Family history of malignant neoplasm, unspecified: Secondary | ICD-10-CM | POA: Diagnosis not present

## 2017-03-09 DIAGNOSIS — E039 Hypothyroidism, unspecified: Secondary | ICD-10-CM | POA: Diagnosis present

## 2017-03-09 DIAGNOSIS — I10 Essential (primary) hypertension: Secondary | ICD-10-CM | POA: Diagnosis not present

## 2017-03-09 DIAGNOSIS — Z7951 Long term (current) use of inhaled steroids: Secondary | ICD-10-CM | POA: Diagnosis not present

## 2017-03-09 DIAGNOSIS — Z85528 Personal history of other malignant neoplasm of kidney: Secondary | ICD-10-CM | POA: Diagnosis not present

## 2017-03-09 DIAGNOSIS — E669 Obesity, unspecified: Secondary | ICD-10-CM | POA: Diagnosis present

## 2017-03-09 DIAGNOSIS — Z801 Family history of malignant neoplasm of trachea, bronchus and lung: Secondary | ICD-10-CM | POA: Diagnosis not present

## 2017-03-09 DIAGNOSIS — Z91041 Radiographic dye allergy status: Secondary | ICD-10-CM | POA: Diagnosis not present

## 2017-03-09 DIAGNOSIS — M109 Gout, unspecified: Secondary | ICD-10-CM | POA: Diagnosis present

## 2017-03-09 DIAGNOSIS — N184 Chronic kidney disease, stage 4 (severe): Secondary | ICD-10-CM | POA: Diagnosis present

## 2017-03-09 DIAGNOSIS — Z87891 Personal history of nicotine dependence: Secondary | ICD-10-CM | POA: Diagnosis not present

## 2017-03-09 DIAGNOSIS — R200 Anesthesia of skin: Secondary | ICD-10-CM | POA: Diagnosis not present

## 2017-03-09 LAB — COMPREHENSIVE METABOLIC PANEL
ALBUMIN: 4.2 g/dL (ref 3.5–5.0)
ALK PHOS: 41 U/L (ref 38–126)
ALT: 22 U/L (ref 17–63)
ANION GAP: 7 (ref 5–15)
AST: 20 U/L (ref 15–41)
BUN: 15 mg/dL (ref 6–20)
CALCIUM: 8.9 mg/dL (ref 8.9–10.3)
CO2: 24 mmol/L (ref 22–32)
Chloride: 105 mmol/L (ref 101–111)
Creatinine, Ser: 1.52 mg/dL — ABNORMAL HIGH (ref 0.61–1.24)
GFR calc Af Amer: 53 mL/min — ABNORMAL LOW (ref >60)
GFR calc non Af Amer: 46 mL/min — ABNORMAL LOW (ref >60)
GLUCOSE: 133 mg/dL — AB (ref 65–99)
POTASSIUM: 4.2 mmol/L (ref 3.5–5.1)
SODIUM: 136 mmol/L (ref 135–145)
Total Bilirubin: 0.5 mg/dL (ref 0.3–1.2)
Total Protein: 6.5 g/dL (ref 6.5–8.1)

## 2017-03-09 LAB — CBC
HCT: 50.8 % (ref 39.0–52.0)
HEMATOCRIT: 48.2 % (ref 39.0–52.0)
HEMOGLOBIN: 16.6 g/dL (ref 13.0–17.0)
Hemoglobin: 15.6 g/dL (ref 13.0–17.0)
MCH: 28.3 pg (ref 26.0–34.0)
MCH: 28.6 pg (ref 26.0–34.0)
MCHC: 32.4 g/dL (ref 30.0–36.0)
MCHC: 32.7 g/dL (ref 30.0–36.0)
MCV: 87.3 fL (ref 78.0–100.0)
MCV: 87.6 fL (ref 78.0–100.0)
PLATELETS: 136 10*3/uL — AB (ref 150–400)
PLATELETS: 146 10*3/uL — AB (ref 150–400)
RBC: 5.52 MIL/uL (ref 4.22–5.81)
RBC: 5.8 MIL/uL (ref 4.22–5.81)
RDW: 16.1 % — AB (ref 11.5–15.5)
RDW: 16.1 % — ABNORMAL HIGH (ref 11.5–15.5)
WBC: 45.2 10*3/uL — AB (ref 4.0–10.5)
WBC: 55.1 10*3/uL (ref 4.0–10.5)

## 2017-03-09 LAB — DIFFERENTIAL
BASOS ABS: 0 10*3/uL (ref 0.0–0.1)
BLASTS: 0 %
Band Neutrophils: 0 %
Basophils Relative: 0 %
EOS ABS: 0 10*3/uL (ref 0.0–0.7)
Eosinophils Relative: 0 %
Lymphocytes Relative: 82 %
Lymphs Abs: 45.1 10*3/uL — ABNORMAL HIGH (ref 0.7–4.0)
METAMYELOCYTES PCT: 0 %
MONOS PCT: 1 %
Monocytes Absolute: 0.6 10*3/uL (ref 0.1–1.0)
Myelocytes: 0 %
NEUTROS ABS: 9.4 10*3/uL — AB (ref 1.7–7.7)
NEUTROS PCT: 17 %
Other: 0 %
Promyelocytes Absolute: 0 %
nRBC: 0 /100 WBC

## 2017-03-09 LAB — URINALYSIS, ROUTINE W REFLEX MICROSCOPIC
BILIRUBIN URINE: NEGATIVE
GLUCOSE, UA: NEGATIVE mg/dL
Hgb urine dipstick: NEGATIVE
KETONES UR: NEGATIVE mg/dL
Leukocytes, UA: NEGATIVE
Nitrite: NEGATIVE
PH: 5 (ref 5.0–8.0)
Protein, ur: NEGATIVE mg/dL
Specific Gravity, Urine: 1.016 (ref 1.005–1.030)

## 2017-03-09 LAB — PROTIME-INR
INR: 1.04
Prothrombin Time: 13.6 seconds (ref 11.4–15.2)

## 2017-03-09 LAB — APTT: APTT: 26 s (ref 24–36)

## 2017-03-09 LAB — RAPID URINE DRUG SCREEN, HOSP PERFORMED
AMPHETAMINES: NOT DETECTED
Barbiturates: NOT DETECTED
Benzodiazepines: NOT DETECTED
COCAINE: NOT DETECTED
OPIATES: POSITIVE — AB
TETRAHYDROCANNABINOL: NOT DETECTED

## 2017-03-09 LAB — ETHANOL

## 2017-03-09 LAB — MRSA PCR SCREENING: MRSA by PCR: NEGATIVE

## 2017-03-09 LAB — TSH: TSH: 1.632 u[IU]/mL (ref 0.350–4.500)

## 2017-03-09 LAB — I-STAT TROPONIN, ED: Troponin i, poc: 0 ng/mL (ref 0.00–0.08)

## 2017-03-09 MED ORDER — IPRATROPIUM BROMIDE 0.02 % IN SOLN
2.5000 mL | RESPIRATORY_TRACT | Status: DC
  Administered 2017-03-09: 0.5 mg via RESPIRATORY_TRACT
  Filled 2017-03-09: qty 2.5

## 2017-03-09 MED ORDER — HYDROCODONE-ACETAMINOPHEN 10-325 MG PO TABS
1.0000 | ORAL_TABLET | Freq: Four times a day (QID) | ORAL | Status: DC | PRN
  Administered 2017-03-09 – 2017-03-10 (×5): 1 via ORAL
  Filled 2017-03-09 (×5): qty 1

## 2017-03-09 MED ORDER — CITALOPRAM HYDROBROMIDE 20 MG PO TABS
10.0000 mg | ORAL_TABLET | Freq: Every day | ORAL | Status: DC
  Administered 2017-03-09 – 2017-03-10 (×2): 10 mg via ORAL
  Filled 2017-03-09 (×2): qty 1

## 2017-03-09 MED ORDER — SODIUM CHLORIDE 0.9 % IV SOLN
1000.0000 mg | Freq: Once | INTRAVENOUS | Status: DC
  Filled 2017-03-09: qty 10

## 2017-03-09 MED ORDER — CLOPIDOGREL BISULFATE 75 MG PO TABS
75.0000 mg | ORAL_TABLET | Freq: Every day | ORAL | Status: DC
Start: 2017-03-09 — End: 2017-03-10
  Administered 2017-03-09 – 2017-03-10 (×2): 75 mg via ORAL
  Filled 2017-03-09 (×2): qty 1

## 2017-03-09 MED ORDER — VITAMIN C 500 MG PO TABS
500.0000 mg | ORAL_TABLET | Freq: Two times a day (BID) | ORAL | Status: DC
  Administered 2017-03-09 – 2017-03-10 (×3): 500 mg via ORAL
  Filled 2017-03-09 (×5): qty 1

## 2017-03-09 MED ORDER — LORAZEPAM 2 MG/ML IJ SOLN: 1.0000 mg | Freq: Once | INTRAMUSCULAR | Status: DC

## 2017-03-09 MED ORDER — ALBUTEROL SULFATE (2.5 MG/3ML) 0.083% IN NEBU: 2.5000 mg | INHALATION_SOLUTION | RESPIRATORY_TRACT | Status: DC | PRN

## 2017-03-09 MED ORDER — ATENOLOL 25 MG PO TABS
50.0000 mg | ORAL_TABLET | Freq: Every day | ORAL | Status: DC
  Administered 2017-03-09: 50 mg via ORAL
  Filled 2017-03-09 (×2): qty 2

## 2017-03-09 MED ORDER — PANTOPRAZOLE SODIUM 40 MG PO TBEC
40.0000 mg | DELAYED_RELEASE_TABLET | Freq: Every day | ORAL | Status: DC
  Administered 2017-03-09 – 2017-03-10 (×2): 40 mg via ORAL
  Filled 2017-03-09 (×2): qty 1

## 2017-03-09 MED ORDER — LEVOTHYROXINE SODIUM 25 MCG PO TABS
50.0000 ug | ORAL_TABLET | Freq: Every day | ORAL | Status: DC
  Administered 2017-03-09 – 2017-03-10 (×2): 50 ug via ORAL
  Filled 2017-03-09 (×2): qty 2

## 2017-03-09 MED ORDER — GLUCOSAMINE 1500 COMPLEX PO CAPS: 1200.0000 | ORAL_CAPSULE | Freq: Two times a day (BID) | ORAL | Status: DC

## 2017-03-09 MED ORDER — FLUTICASONE FUROATE-VILANTEROL 100-25 MCG/INH IN AEPB
1.0000 | INHALATION_SPRAY | Freq: Every day | RESPIRATORY_TRACT | Status: DC
  Administered 2017-03-10: 08:00:00 1 via RESPIRATORY_TRACT
  Filled 2017-03-09: qty 28

## 2017-03-09 MED ORDER — ACETAMINOPHEN 325 MG PO TABS
650.0000 mg | ORAL_TABLET | Freq: Four times a day (QID) | ORAL | Status: DC | PRN
  Administered 2017-03-09: 650 mg via ORAL
  Filled 2017-03-09: qty 2

## 2017-03-09 MED ORDER — ALLOPURINOL 100 MG PO TABS
100.0000 mg | ORAL_TABLET | Freq: Every day | ORAL | Status: DC
  Administered 2017-03-09 – 2017-03-10 (×2): 100 mg via ORAL
  Filled 2017-03-09 (×2): qty 1

## 2017-03-09 MED ORDER — ONDANSETRON HCL 4 MG/2ML IJ SOLN
4.0000 mg | Freq: Four times a day (QID) | INTRAMUSCULAR | Status: DC | PRN
  Administered 2017-03-09 – 2017-03-10 (×6): 4 mg via INTRAVENOUS
  Filled 2017-03-09 (×6): qty 2

## 2017-03-09 MED ORDER — AMLODIPINE BESYLATE 5 MG PO TABS
5.0000 mg | ORAL_TABLET | Freq: Every day | ORAL | Status: DC
  Administered 2017-03-09 – 2017-03-10 (×2): 5 mg via ORAL
  Filled 2017-03-09 (×2): qty 1

## 2017-03-09 MED ORDER — HEPARIN SODIUM (PORCINE) 5000 UNIT/ML IJ SOLN
5000.0000 [IU] | Freq: Three times a day (TID) | INTRAMUSCULAR | Status: DC
  Administered 2017-03-09 – 2017-03-10 (×5): 5000 [IU] via SUBCUTANEOUS
  Filled 2017-03-09 (×5): qty 1

## 2017-03-09 MED ORDER — SODIUM CHLORIDE 0.9% FLUSH
3.0000 mL | Freq: Two times a day (BID) | INTRAVENOUS | Status: DC
  Administered 2017-03-09 – 2017-03-10 (×3): 3 mL via INTRAVENOUS

## 2017-03-09 MED ORDER — LEVALBUTEROL TARTRATE 45 MCG/ACT IN AERO: 1.0000 | INHALATION_SPRAY | RESPIRATORY_TRACT | Status: DC | PRN

## 2017-03-09 NOTE — Care Management Note (Signed)
Case Management Note  Patient Details  Name: [name] Ball MRN: [id number] Date of Birth: Oct 11, 1951  Subjective/Objective:  Adm with HA and right sided numbness. From home with family. Walks with cane. Has PCP, transportation to appointments and has no issues affording medications.           Action/Plan: No known CM needs. PT eval pending. Will follow .   Expected Discharge Date:      03/10/2017            Expected Discharge Plan:     In-House Referral:     Discharge planning Services  CM Consult  Post Acute Care Choice:    Choice offered to:     DME Arranged:    DME Agency:     HH Arranged:    HH Agency:     Status of Service:  In process, will continue to follow  If discussed at Long Length of Stay Meetings, dates discussed:    Additional Comments:  Torian Thoennes, Chauncey Reading, RN 03/09/2017, 1:45 PM

## 2017-03-09 NOTE — ED Notes (Signed)
Code stroke cancelled at [facility]. Tele neurology wanted mri in am. No interventions at this time, EDP aware.

## 2017-03-09 NOTE — ED Notes (Signed)
CRITICAL VALUE ALERT  Critical value received:  Wbc 55.1  Date of notification:  03/09/17  Time of notification:  2345  Critical value read back:Yes.    Nurse who received alert:  Joellyn Rued, RN  MD notified (1st page):  Dr Wyvonnia Dusky  Time of first page:  2345  MD notified (2nd page):  Time of second page:  Responding MD:  Dr Wyvonnia Dusky  Time MD responded:  [phone]

## 2017-03-09 NOTE — Care Management Obs Status (Signed)
[facility] NOTIFICATION   Patient Details  Name: [name] Ball MRN: [id number] Date of Birth: Jan 14, 1951   Medicare Observation Status Notification Given:  Yes    Tanijah Morais, Chauncey Reading, RN 03/09/2017, 1:27 PM

## 2017-03-09 NOTE — Progress Notes (Signed)
Code stroke  Order placed  [id number], [place]   1144 In ct   1150 Out of ct  0379  [facility]   6190 VQQ   2411

## 2017-03-09 NOTE — H&P (Signed)
History and Physical    [name] Ball UMP:[id number] DOB: 04-Oct-1951 DOA: 03/08/2017  PCP: Vic Blackbird, MD  Patient coming from: Home.    Chief Complaint:   HA with right face and right hand numbness.   HPI: [name] Ball is an 66 y.o. male with hx of migaine HA, allergy, HTN, COPD, CLL, acquired one kidney with CKD3, hx of kidney cancer, GERD, hypothyrodism, presented to the ER with rightsided HA and right facial numbness with right arm numbness and weakness.  He arrived as code stroke, but not a candidate for TPA as he was improving, and ictus was outside of 4 hours TPA window.  He denied lower extrmity weakness, but had some slurred speech.  He improved completely, except for slight numbness of his face.  Wife said he has had frequent HA of late.  Work up in the ER included a head CT which was negative.  EKG showed NSR and serology was unremarkable.    ED Course:  See above.  Rewiew of Systems:  Constitutional: Negative for malaise, fever and chills. No significant weight loss or weight gain Eyes: Negative for eye pain, redness and discharge, diplopia, visual changes, or flashes of light. ENMT: Negative for ear pain, hoarseness, nasal congestion, sinus pressure and sore throat. No headaches; tinnitus, drooling, or problem swallowing. Cardiovascular: Negative for chest pain, palpitations, diaphoresis, dyspnea and peripheral edema. ; No orthopnea, PND Respiratory: Negative for cough, hemoptysis, wheezing and stridor. No pleuritic chestpain. Gastrointestinal: Negative for diarrhea, constipation,  melena, blood in stool, hematemesis, jaundice and rectal bleeding.    Genitourinary: Negative for frequency, dysuria, incontinence,flank pain and hematuria; Musculoskeletal: Negative for back pain and neck pain. Negative for swelling and trauma.;  Skin: . Negative for pruritus, rash, abrasions, bruising and skin lesion.; ulcerations Neuro: Negative for lightheadedness and neck stiffness.  Negative for  altered level of consciousness , altered mental status,  burning feet, involuntary movement, seizure and syncope.  Psych: negative for anxiety, depression, insomnia, tearfulness, panic attacks, hallucinations, paranoia, suicidal or homicidal ideation    Past Medical History:  Diagnosis Date  . Allergy   . Arthritis   . Asthma   . Cancer ([place])    kidney  . CLL (chronic lymphocytic leukemia) ([place]) 2016  . COPD (chronic obstructive pulmonary disease) ([place])   . Diverticulitis   . Diverticulosis    colonoscopy 3/15  . GERD (gastroesophageal reflux disease)   . Hypertension   . Low testosterone   . Migraine   . Shingles 03/31/2016  . Solar purpura ([place])   . Thyroid disease    hypothryoid  . Ulcer ([place])   . Ventral hernia FEB 2015 CT    Large infra umbilical ventral hernia containing loops of small    Past Surgical History:  Procedure Laterality Date  . abdominal mesh inserted  june 2006, may 2008,dec 2010  . CHOLECYSTECTOMY  june 2007  . COLON SURGERY  sept 2007  . COLONOSCOPY    . COLONOSCOPY N/A 02/20/2014   Procedure: COLONOSCOPY;  Surgeon: Danie Binder, MD;  Location: [facility];  Service: Endoscopy;  Laterality: N/A;  . HERNIA REPAIR  05/2005 5/20108, 11/2009   with mesh  . KIDNEY CYST REMOVAL  jan 2005   renal cell carcinoma  . knee minescus repair Left feb 2007  . NEPHRECTOMY       reports that he quit smoking about 20 years ago. He has never used smokeless tobacco. He reports that he drinks alcohol. He reports that  he does not use drugs.  Allergies  Allergen Reactions  . Contrast Media [Iodinated Diagnostic Agents]     Single functioning kidney  . Other Hives    Neoprene fabric Strawberry   . Ether Rash    Took a long time to wake up Doesn't wake up from it    Family History  Problem Relation Age of Onset  . Cancer Mother     lung  . Colon cancer Neg Hx      Prior to Admission medications   Medication Sig Start Date End Date  Taking? Authorizing Provider  acetaminophen (TYLENOL) 325 MG tablet Take 650 mg by mouth every 6 (six) hours as needed.    Historical Provider, MD  albuterol (PROVENTIL) (2.5 MG/3ML) 0.083% nebulizer solution TAKE 3MLS(1 VIAL) VIA NEBULIZER EVERY 6 HOURS AS NEEDED FOR WHEEZING OR SHORTNESS OF BREATH 11/08/16   Alycia Rossetti, MD  allopurinol (ZYLOPRIM) 100 MG tablet TAKE 1 TABLET(100 MG) BY MOUTH AT BEDTIME 03/02/17   Alycia Rossetti, MD  amLODipine (NORVASC) 5 MG tablet TAKE 1 TABLET(5 MG) BY MOUTH DAILY 12/24/16   Alycia Rossetti, MD  ATROVENT HFA 17 MCG/ACT inhaler INHALE 2 PUFFS BY MOUTH INTO THE LUNGS EVERY 6 HOURS 11/08/16   Alycia Rossetti, MD  calcium carbonate (TUMS - DOSED IN MG ELEMENTAL CALCIUM) 500 MG chewable tablet Chew 2-3 tablets by mouth once as needed for indigestion or heartburn. Reported on 11/21/2015    Historical Provider, MD  cholecalciferol (VITAMIN D) 1000 UNITS tablet Take 1,000 Units by mouth every morning.     Historical Provider, MD  citalopram (CELEXA) 10 MG tablet TAKE 1 TABLET(10 MG) BY MOUTH DAILY 01/26/17   Alycia Rossetti, MD  DEXILANT 60 MG capsule TAKE 1 CAPSULE BY MOUTH DAILY 01/28/17   Alycia Rossetti, MD  diphenhydrAMINE (BENADRYL) 25 MG tablet Take 25 mg by mouth every evening.     Historical Provider, MD  eletriptan (RELPAX) 20 MG tablet Take 1 tablet (20 mg total) by mouth as needed for migraine. One tablet by mouth at onset of headache. May repeat in 2 hours if headache persists or recurs. 01/16/14   Alycia Rossetti, MD  fluticasone furoate-vilanterol (BREO ELLIPTA) 100-25 MCG/INH AEPB Inhale 1 puff into the lungs daily. 11/10/16   Alycia Rossetti, MD  Glucosamine-Chondroit-Vit C-Mn (GLUCOSAMINE 1500 COMPLEX) CAPS Take 1,200 capsules by mouth 2 (two) times daily.    Historical Provider, MD  HYDROcodone-acetaminophen (NORCO) 10-325 MG tablet Take 1-2 tablets by mouth every 8 (eight) hours as needed. 02/25/17   Alycia Rossetti, MD  levalbuterol [facility] HFA)  45 MCG/ACT inhaler Inhale 1-2 puffs into the lungs every 4 (four) hours as needed for wheezing. 07/22/14   Alycia Rossetti, MD  levofloxacin (LEVAQUIN) 500 MG tablet Take 1 tablet (500 mg total) by mouth daily. 11/10/16   Alycia Rossetti, MD  levothyroxine (SYNTHROID, LEVOTHROID) 50 MCG tablet TAKE 1 TABLET(50 MCG) BY MOUTH DAILY BEFORE BREAKFAST 01/06/17   Alycia Rossetti, MD  loratadine (CLARITIN) 10 MG tablet Take 10 mg by mouth every morning.     Historical Provider, MD  metoprolol (LOPRESSOR) 50 MG tablet TAKE 1 TABLET(50 MG) BY MOUTH TWICE DAILY 03/02/17   Alycia Rossetti, MD  mometasone [facility] 60 METERED DOSES) 220 MCG/INH inhaler Inhale 2 puffs into the lungs 2 (two) times daily. 08/08/15   Alycia Rossetti, MD  Multiple Vitamin (MULTIVITAMIN WITH MINERALS) TABS tablet Take 1 tablet by mouth daily.  Historical Provider, MD  testosterone cypionate (DEPOTESTOSTERONE CYPIONATE) 200 MG/ML injection INJECT 1.5 MLS INTO THE MUSCLE EVERY 14 DAYS 11/17/16   Alycia Rossetti, MD  vitamin B-12 (CYANOCOBALAMIN) 1000 MCG tablet Take 1,000 mcg by mouth every morning.     Historical Provider, MD  vitamin C (ASCORBIC ACID) 500 MG tablet Take 500 mg by mouth 2 (two) times daily.    Historical Provider, MD  vitamin E 400 UNIT capsule Take 400 Units by mouth every morning.     Historical Provider, MD    Constitutional: NAD, calm, comfortable Vitals:   03/09/17 0130 03/09/17 0200 03/09/17 0230 03/09/17 0300  BP: (!) 163/79 (!) 151/73 (!) 150/68 (!) 146/64  Pulse: 67 68 64 69  Resp: 15 20 15 17   Temp:      TempSrc:      SpO2: 94% 94% 93% 95%  Weight:      Height:       Eyes: PERRL, lids and conjunctivae normal ENMT: Mucous membranes are moist. Posterior pharynx clear of any exudate or lesions.Normal dentition.  Neck: normal, supple, no masses, no thyromegaly Respiratory: clear to auscultation bilaterally, no wheezing, no crackles. Normal respiratory effort. No accessory muscle use.  Cardiovascular:  Regular rate and rhythm, no murmurs / rubs / gallops. No extremity edema. 2+ pedal pulses. No carotid bruits.  Abdomen: no tenderness, no masses palpated. No hepatosplenomegaly. Bowel sounds positive.  Musculoskeletal: no clubbing / cyanosis. No joint deformity upper and lower extremities. Good ROM, no contractures. Normal muscle tone.  Skin: no rashes, lesions, ulcers. No induration Neurologic: CN 2-12 grossly intact. Sensation intact, DTR normal. Strength 5/5 in all 4.  Psychiatric: Normal judgment and insight. Alert and oriented x 3. Normal mood.     Labs on Admission: I have personally reviewed following labs and imaging studies  CBC:  Recent Labs Lab 03/08/17 2345 03/08/17 2353  WBC 55.1*  --   NEUTROABS 9.4*  --   HGB 16.6 17.7*  HCT 50.8 52.0  MCV 87.6  --   PLT 146*  --    Basic Metabolic Panel:  Recent Labs Lab 03/08/17 2345 03/08/17 2353  NA 136 139  K 4.2 4.3  CL 105 104  CO2 24  --   GLUCOSE 133* 133*  BUN 15 16  CREATININE 1.52* 1.60*  CALCIUM 8.9  --    Liver Function Tests:  Recent Labs Lab 03/08/17 2345  AST 20  ALT 22  ALKPHOS 41  BILITOT 0.5  PROT 6.5  ALBUMIN 4.2   Coagulation Profile:  Recent Labs Lab 03/08/17 2345  INR 1.04   Urine analysis:    Component Value Date/Time   COLORURINE YELLOW 03/09/2017 0105   APPEARANCEUR CLEAR 03/09/2017 0105   LABSPEC 1.016 03/09/2017 0105   PHURINE 5.0 03/09/2017 0105   GLUCOSEU NEGATIVE 03/09/2017 0105   HGBUR NEGATIVE 03/09/2017 0105   BILIRUBINUR NEGATIVE 03/09/2017 0105   KETONESUR NEGATIVE 03/09/2017 0105   PROTEINUR NEGATIVE 03/09/2017 0105   UROBILINOGEN 0.2 05/21/2014 2340   NITRITE NEGATIVE 03/09/2017 0105   LEUKOCYTESUR NEGATIVE 03/09/2017 0105    Radiological Exams on Admission: Ct Head Code Stroke W/o Cm  Result Date: 03/09/2017 CLINICAL DATA:  Code stroke.  66 y/o  M; right-sided weakness. EXAM: CT HEAD WITHOUT CONTRAST TECHNIQUE: Contiguous axial images were obtained from  the base of the skull through the vertex without intravenous contrast. COMPARISON:  None. FINDINGS: Brain: No evidence of acute infarction, hemorrhage, hydrocephalus, extra-axial collection or mass lesion/mass effect. Vascular: Moderate  calcific atherosclerosis of cavernous and paraclinoid internal carotid arteries. Skull: Well-circumscribed right occipital lucent lesion probably representing an arachnoid granulation. No displaced calvarial fracture. Sinuses/Orbits: Mild right-sided maxillary sinus mucosal thickening. Partial opacification of the left mastoid air cells with sclerosis probably representing sequelae of chronic otomastoiditis. Other: 6 x 18 mm right occipital scalp lipoma. ASPECTS [facility] Stroke Program Early CT Score) - Ganglionic level infarction (caudate, lentiform nuclei, internal capsule, insula, M1-M3 cortex): 7 - Supraganglionic infarction (M4-M6 cortex): 3 Total score (0-10 with 10 being normal): 10 IMPRESSION: 1. No acute intracranial abnormality identified. If symptoms persist or if clinically indicated MRI is more sensitive for acute stroke. 2. ASPECTS is 10 These results were called by telephone at the time of interpretation on 03/09/2017 at 12:03 am to Dr. Ezequiel Essex , who verbally acknowledged these results. Electronically Signed   By: Kristine Garbe M.D.   On: 03/09/2017 00:03    EKG: Independently reviewed.   Assessment/Plan Principal Problem:   Complicated migraine Active Problems:   TIA (transient ischemic attack)   COPD (chronic obstructive pulmonary disease) (HCC)   Hypertension   Asthma    PLAN:   TIA/CVA  Vs complicated migraine.  Will obtain MRI of the brain, but it is suspicious for complicated migraine.  Will change Lopressor to Atenelol 50mg  per day, perhaps it will be better suppressive therapy for his migraine.  Will continue with ASA.  If he had a CVA, will need full TIA work up, and place him on [place].  Currently, I have only continue him on  ASA.    COPD:  Stable.  Continue with meds.   HTN:  Continue with meds.  BP is a little high today.  Will follow and not Tx in the setting of possible CVA.   DVT prophylaxis: SUBQ Hep.  Code Status: FULL CODE.  Family Communication: wife at bedside.  Disposition Plan: home.  Consults called: None. Teleneurology.  Admission status: OBS>    Skyler Dusing MD FACP. [facility]  If 7PM-7AM, please contact night-coverage [email or web address] Password [email or web address]  03/09/2017, 3:09 AM

## 2017-03-09 NOTE — Progress Notes (Addendum)
PROGRESS NOTE                                                                                                                                                                                                             Patient Demographics:    [name] Ball, is a 66 y.o. male, DOB - 1951-10-25, FKC:[id number]  Admit date - 03/08/2017   Admitting Physician Orvan Falconer, MD  Outpatient Primary MD for the patient is Vic Blackbird, MD  LOS - 0  Chief Complaint  Patient presents with  . Numbness       Brief Narrative  [name] Ball is an 66 y.o. male with hx of migaine HA, allergy, HTN, COPD, CLL, acquired one kidney with CKD3, hx of kidney cancer, GERD, hypothyrodism, presented to the ER with rightsided HA and right facial numbness with right arm numbness and weakness, due to acute CVA   Subjective:    [name] Ball today has, No headache, No chest pain, No abdominal pain - No Nausea, No new weakness tingling or numbness, No Cough - SOB.     Assessment  & Plan :     1. CVA - MRI showing Acute left thalamic infarct, minimal sypmtoms, Placed on Plavix (no ASA as 1 kidney as told by Nephrologist not to take) , ordered echocardiogram, carotid duplex along with A1c and lipid panel, will be seen by PT, OT and speech. Neurology consulted.  Lab Results  Component Value Date   HGBA1C 5.4 11/10/2016   Lab Results  Component Value Date   CHOL 138 11/10/2016   HDL 23 (L) 11/10/2016   LDLCALC 56 11/10/2016   TRIG 293 (H) 11/10/2016   CHOLHDL 6.0 (H) 11/10/2016    2. CLL - stable, no acute issues, follow with PCP and Primary Oncologist post DC.  3. HTN - blood pressure stable on Norvasc and beta blocker.  4. Hypothyroidism. Continue home dose Synthroid.  5. GERD. On PPI continue.  6. Gout. Stable on allopurinol.  7.Depression - Celexa continued.  8. CKD 4 with Only one kidney, one kidney removed due to history of  cancer. Creatinine at baseline.    Diet : Diet Heart Room service appropriate? Yes; Fluid consistency: Thin    Family Communication  : wife  Code Status :  Full  Disposition Plan  :  Home 1-2  days  Consults  :  Neuro  Procedures  :    MRI- A Head -    DVT Prophylaxis  :   Heparin    Lab Results  Component Value Date   PLT 136 (L) 03/09/2017    Inpatient Medications  Scheduled Meds: . allopurinol  100 mg Oral Daily  . amLODipine  5 mg Oral Daily  . atenolol  50 mg Oral Daily  . citalopram  10 mg Oral Daily  . clopidogrel  75 mg Oral Daily  . fluticasone furoate-vilanterol  1 puff Inhalation Daily  . heparin  5,000 Units Subcutaneous Q8H  . levothyroxine  50 mcg Oral QAC breakfast  . pantoprazole  40 mg Oral Daily  . sodium chloride flush  3 mL Intravenous Q12H  . vitamin C  500 mg Oral BID   Continuous Infusions: PRN Meds:.acetaminophen, albuterol, HYDROcodone-acetaminophen, ondansetron (ZOFRAN) IV  Antibiotics  :    Anti-infectives    None         Objective:   Vitals:   03/09/17 0600 03/09/17 0735 03/09/17 0945 03/09/17 1144  BP: (!) 154/85  (!) 147/110   Pulse: 65  71 (!) 59  Resp: 16   19  Temp:    97.5 F (36.4 C)  TempSrc:    Oral  SpO2: 94% 94%  94%  Weight:      Height:        Wt Readings from Last 3 Encounters:  03/09/17 115.1 kg (253 lb 12 oz)  12/08/16 115.8 kg (255 lb 4.8 oz)  11/10/16 120.7 kg (266 lb)    No intake or output data in the 24 hours ending 03/09/17 1153   Physical Exam  Awake Alert, Oriented X 3, No new F.N deficits, hard of hearing, L arm and facial numbness, Normal affect [place].AT,PERRAL Supple Neck,No JVD, No cervical lymphadenopathy appriciated.  Symmetrical Chest wall movement, Good air movement bilaterally, CTAB RRR,No Gallops,Rubs or new Murmurs, No Parasternal Heave +ve B.Sounds, Abd Soft, No tenderness, No organomegaly appriciated, No rebound - guarding or rigidity. No Cyanosis, Clubbing or edema, No new  Rash or bruise       Data Review:    CBC  Recent Labs Lab 03/08/17 2345 03/08/17 2353 03/09/17 0656  WBC 55.1*  --  45.2*  HGB 16.6 17.7* 15.6  HCT 50.8 52.0 48.2  PLT 146*  --  136*  MCV 87.6  --  87.3  MCH 28.6  --  28.3  MCHC 32.7  --  32.4  RDW 16.1*  --  16.1*  LYMPHSABS 45.1*  --   --   MONOABS 0.6  --   --   EOSABS 0.0  --   --   BASOSABS 0.0  --   --     Chemistries   Recent Labs Lab 03/08/17 2345 03/08/17 2353  NA 136 139  K 4.2 4.3  CL 105 104  CO2 24  --   GLUCOSE 133* 133*  BUN 15 16  CREATININE 1.52* 1.60*  CALCIUM 8.9  --   AST 20  --   ALT 22  --   ALKPHOS 41  --   BILITOT 0.5  --    ------------------------------------------------------------------------------------------------------------------ No results for input(s): CHOL, HDL, LDLCALC, TRIG, CHOLHDL, LDLDIRECT in the last 72 hours.  Lab Results  Component Value Date   HGBA1C 5.4 11/10/2016   ------------------------------------------------------------------------------------------------------------------  Recent Labs  03/08/17 2345  TSH 1.632   ------------------------------------------------------------------------------------------------------------------ No results for input(s): VITAMINB12, FOLATE, FERRITIN, TIBC,  IRON, RETICCTPCT in the last 72 hours.  Coagulation profile  Recent Labs Lab 03/08/17 2345  INR 1.04    No results for input(s): DDIMER in the last 72 hours.  Cardiac Enzymes No results for input(s): CKMB, TROPONINI, MYOGLOBIN in the last 168 hours.  Invalid input(s): CK ------------------------------------------------------------------------------------------------------------------ No results found for: BNP  Micro Results Recent Results (from the past 240 hour(s))  MRSA PCR Screening     Status: None   Collection Time: 03/09/17  4:07 AM  Result Value Ref Range Status   MRSA by PCR NEGATIVE NEGATIVE Final    Comment:        The GeneXpert MRSA  Assay (FDA approved for NASAL specimens only), is one component of a comprehensive MRSA colonization surveillance program. It is not intended to diagnose MRSA infection nor to guide or monitor treatment for MRSA infections.     Radiology Reports Mr Virgel Paling QV Contrast  Result Date: 03/09/2017 CLINICAL DATA:  Headache with right facial and right upper extremity numbness. EXAM: MRI HEAD WITHOUT CONTRAST MRA HEAD WITHOUT CONTRAST TECHNIQUE: Multiplanar, multiecho pulse sequences of the brain and surrounding structures were obtained without intravenous contrast. Angiographic images of the head were obtained using MRA technique without contrast. COMPARISON:  Head CT 03/08/2017 FINDINGS: MRI HEAD FINDINGS Brain: There is a 1.1 cm acute infarct in the left thalamus. There is no evidence of intracranial hemorrhage, mass, midline shift, or extra-axial fluid collection. The ventricles and sulci are within normal limits for age. Periventricular white matter T2 hyperintensities are nonspecific but compatible with mild chronic small vessel ischemic disease. Vascular: Major intracranial vascular flow voids are preserved. Skull and upper cervical spine: No suspicious marrow lesion. Incidental arachnoid granulation in the occipital bone. Sinuses/Orbits: Unremarkable orbits. Clear paranasal sinuses. Left mastoid effusion. Other: None. MRA HEAD FINDINGS The visualized distal vertebral arteries are patent to the basilar with the right being dominant. There is mild narrowing of the left V4 segment distal to the PICA origin. Patent PICA and SCA origins are identified bilaterally. The basilar artery is widely patent. There is a patent right posterior communicating artery. PCAs are patent without evidence of significant proximal stenosis. The internal carotid arteries are widely patent from skullbase to carotid termini. ACAs and MCAs are patent without evidence of major branch occlusion or significant proximal stenosis. MCA  branch vessel evaluation is mildly limited by motion artifact. No intracranial aneurysm is identified. IMPRESSION: 1. Acute left thalamic infarct. 2. Mild chronic small vessel ischemic disease. 3. No large vessel occlusion. Mild stenosis of the nondominant distal left vertebral artery. Electronically Signed   By: Logan Bores M.D.   On: 03/09/2017 08:37   Mr Brain Wo Contrast  Result Date: 03/09/2017 CLINICAL DATA:  Headache with right facial and right upper extremity numbness. EXAM: MRI HEAD WITHOUT CONTRAST MRA HEAD WITHOUT CONTRAST TECHNIQUE: Multiplanar, multiecho pulse sequences of the brain and surrounding structures were obtained without intravenous contrast. Angiographic images of the head were obtained using MRA technique without contrast. COMPARISON:  Head CT 03/08/2017 FINDINGS: MRI HEAD FINDINGS Brain: There is a 1.1 cm acute infarct in the left thalamus. There is no evidence of intracranial hemorrhage, mass, midline shift, or extra-axial fluid collection. The ventricles and sulci are within normal limits for age. Periventricular white matter T2 hyperintensities are nonspecific but compatible with mild chronic small vessel ischemic disease. Vascular: Major intracranial vascular flow voids are preserved. Skull and upper cervical spine: No suspicious marrow lesion. Incidental arachnoid granulation in the occipital bone. Sinuses/Orbits:  Unremarkable orbits. Clear paranasal sinuses. Left mastoid effusion. Other: None. MRA HEAD FINDINGS The visualized distal vertebral arteries are patent to the basilar with the right being dominant. There is mild narrowing of the left V4 segment distal to the PICA origin. Patent PICA and SCA origins are identified bilaterally. The basilar artery is widely patent. There is a patent right posterior communicating artery. PCAs are patent without evidence of significant proximal stenosis. The internal carotid arteries are widely patent from skullbase to carotid termini. ACAs  and MCAs are patent without evidence of major branch occlusion or significant proximal stenosis. MCA branch vessel evaluation is mildly limited by motion artifact. No intracranial aneurysm is identified. IMPRESSION: 1. Acute left thalamic infarct. 2. Mild chronic small vessel ischemic disease. 3. No large vessel occlusion. Mild stenosis of the nondominant distal left vertebral artery. Electronically Signed   By: Logan Bores M.D.   On: 03/09/2017 08:37   Ct Head Code Stroke W/o Cm  Result Date: 03/09/2017 CLINICAL DATA:  Code stroke.  66 y/o  M; right-sided weakness. EXAM: CT HEAD WITHOUT CONTRAST TECHNIQUE: Contiguous axial images were obtained from the base of the skull through the vertex without intravenous contrast. COMPARISON:  None. FINDINGS: Brain: No evidence of acute infarction, hemorrhage, hydrocephalus, extra-axial collection or mass lesion/mass effect. Vascular: Moderate calcific atherosclerosis of cavernous and paraclinoid internal carotid arteries. Skull: Well-circumscribed right occipital lucent lesion probably representing an arachnoid granulation. No displaced calvarial fracture. Sinuses/Orbits: Mild right-sided maxillary sinus mucosal thickening. Partial opacification of the left mastoid air cells with sclerosis probably representing sequelae of chronic otomastoiditis. Other: 6 x 18 mm right occipital scalp lipoma. ASPECTS [facility] Stroke Program Early CT Score) - Ganglionic level infarction (caudate, lentiform nuclei, internal capsule, insula, M1-M3 cortex): 7 - Supraganglionic infarction (M4-M6 cortex): 3 Total score (0-10 with 10 being normal): 10 IMPRESSION: 1. No acute intracranial abnormality identified. If symptoms persist or if clinically indicated MRI is more sensitive for acute stroke. 2. ASPECTS is 10 These results were called by telephone at the time of interpretation on 03/09/2017 at 12:03 am to Dr. Ezequiel Essex , who verbally acknowledged these results. Electronically Signed   By:  Kristine Garbe M.D.   On: 03/09/2017 00:03    Time Spent in minutes  30   SINGH,PRASHANT K M.D on 03/09/2017 at 11:53 AM  Between 7am to 7pm - Pager - [phone] ( page via [facility], text pages only, please mention full 10 digit call back number).  After 7pm go to [email or web address] - password [facility]  [facility] -  Office  [phone]

## 2017-03-09 NOTE — Care Management (Addendum)
PT recommends OP PT. Patient agreeable. Electronic referral placed to [facility] and OP OT.

## 2017-03-09 NOTE — Evaluation (Signed)
Physical Therapy Evaluation Patient Details Name: [name] Ball MRN: [id number] DOB: Oct 15, 1951 Today's Date: 03/09/2017   History of Present Illness  66 y.o. male with hx of migaine HA, allergy, HTN, COPD, CLL, acquired one kidney with CKD3, hx of kidney cancer, GERD, hypothyrodism, presented to the ER with rightsided HA and right facial numbness with right arm numbness and weakness.  He arrived as code stroke, but not a candidate for TPA as he was improving, and ictus was outside of 4 hours TPA window.  He denied lower extrmity weakness, but had some slurred speech.  He improved completely, except for slight numbness of his face.  Wife said he has had frequent HA of late.  Work up in the ER included a head CT which was negative.  EKG showed NSR and serology was unremarkable.  MRI: Acute left thalamic infarct.    Clinical Impression  Pt received in bed, wife present, and pt is agreeable to PT evaluation.  Pt expressed that he his normally unlimited with community ambulation and occasionally uses a cane.  He is also independent with dressing and bathing, and he still drives.  During PT evaluation, he is noted to be somewhat impulsive, but responds well to education.  He was able to perform sit<>stand and ambulate 269ft with cane and Min guard. He does demonstrate unsteady gait, as well as SOB with gait.  SpO2 at times drops into the 80's, however improves with seated rest.  At this point, pt is recommended for OPPT to optimize his strength, balance, and endurance.      Follow Up Recommendations Outpatient PT    Equipment Recommendations  None recommended by PT    Recommendations for Other Services       Precautions / Restrictions Precautions Precautions: Fall Precaution Comments: Pt reports 1-2 falls in the past 6 months where his LE's give out due to his knees being bone on bone.  Restrictions Weight Bearing Restrictions: No      Mobility  Bed Mobility Overal bed mobility: Modified  Independent                Transfers Overall transfer level: Needs assistance Equipment used: Straight cane Transfers: Sit to/from Stand Sit to Stand: Min guard         General transfer comment: Noted impulsivity with transfers, cues for breathing.   Ambulation/Gait Ambulation/Gait assistance: Min guard Ambulation Distance (Feet): 200 Feet Assistive device: Straight cane Gait Pattern/deviations: Antalgic;Wide base of support;Drifts right/left   Gait velocity interpretation: <1.8 ft/sec, indicative of risk for recurrent falls General Gait Details: Pt is also notably SOB during ambulation - however he states that is not completely uncommon for him due to his COPD, however it is worse than normal.  SpO2 at times drops into the 80% during mobility while pt is on RA.  Stairs            Wheelchair Mobility    Modified Rankin (Stroke Patients Only)       Balance Overall balance assessment: History of Falls;Needs assistance Sitting-balance support: Bilateral upper extremity supported;Feet supported Sitting balance-Leahy Scale: Good     Standing balance support: Single extremity supported Standing balance-Leahy Scale: Fair                               Pertinent Vitals/Pain Pain Assessment: 0-10 Pain Score: 7  Pain Location: HA Pain Descriptors / Indicators: Constant Pain Intervention(s): Limited activity within patient's tolerance;Monitored  during session;Repositioned    Home Living   Living Arrangements: Spouse/significant other Available Help at Discharge: Available 24 hours/day Type of Home: House Home Access: Stairs to enter   [employer] of Steps: 3 with HR Home Layout: One level Home Equipment: Grab bars - tub/shower;Cane - single point;Shower seat      Prior Function Level of Independence: Independent with assistive device(s)   Gait / Transfers Assistance Needed: Mod Independent with unlimited community ambulation  distances - uses a cane when his knees bother him.  Wife states they have a very active lifestyle and travel often as [employer].   ADL's / Homemaking Assistance Needed: independent  Comments: Pt still drives     Hand Dominance        Extremity/Trunk Assessment   Upper Extremity Assessment Upper Extremity Assessment: Overall WFL for tasks assessed    Lower Extremity Assessment Lower Extremity Assessment: Generalized weakness    Cervical / Trunk Assessment Cervical / Trunk Assessment: Normal  Communication   Communication: HOH (Wife uses American Sign Language frequently to communicate with him when he does not understand or hear something. )  Cognition Arousal/Alertness: Awake/alert Behavior During Therapy: WFL for tasks assessed/performed Overall Cognitive Status: Within Functional Limits for tasks assessed                                        General Comments      Exercises     Assessment/Plan    PT Assessment Patient needs continued PT services  PT Problem List Decreased strength;Decreased activity tolerance;Decreased balance;Decreased mobility;Obesity       PT Treatment Interventions DME instruction;Gait training;Functional mobility training;Therapeutic activities;Therapeutic exercise;Balance training;Neuromuscular re-education;Patient/family education    PT Goals (Current goals can be found in the Care Plan section)  Acute Rehab PT Goals Patient Stated Goal: To go home.  PT Goal Formulation: With patient/family Time For Goal Achievement: 03/16/17 Potential to Achieve Goals: Good    Frequency Min 5X/week   Barriers to discharge        Co-evaluation               End of Session Equipment Utilized During Treatment: Gait belt Activity Tolerance: Patient limited by fatigue Patient left: in chair;with call bell/phone within reach;with family/visitor present Nurse Communication: Mobility status [place], RN notifie of pt's  mobility status, and mobility sheet left in the room. ) PT Visit Diagnosis: History of falling (Z91.81);Muscle weakness (generalized) (M62.81);Other abnormalities of gait and mobility (R26.89)    Time: 9030-0923 PT Time Calculation (min) (ACUTE ONLY): 29 min   Charges:   PT Evaluation $PT Eval Low Complexity: 1 Procedure PT Treatments $Gait Training: 8-22 mins   PT G Codes:   PT G-Codes **NOT FOR INPATIENT CLASS** Functional Assessment Tool Used: AM-PAC 6 Clicks Basic Mobility;Clinical judgement Functional Limitation: Mobility: Walking and moving around Mobility: Walking and Moving Around Current Status (R0076): At least 20 percent but less than 40 percent impaired, limited or restricted Mobility: Walking and Moving Around Goal Status [phone]): At least 1 percent but less than 20 percent impaired, limited or restricted    Beth Kaysen Sefcik, PT, DPT X: [phone]

## 2017-03-09 NOTE — Consult Note (Signed)
Gage A. Merlene Laughter, MD     [email or web address]          [name] Ball is an 66 y.o. male.   ASSESSMENT/PLAN: 1. Lacunar infarct involving the left thalamic region: Risk factors hypertension and age. The patient has been started on Plavix appropriately. Echocardiography is pending.  2. CLL  3. Status post nephrectomy for renal carcinoma.  4. COPD    The patient is a 66 year old white male who presents with the acute onset of facial numbness on the right side and right hand numbness. The patient also had heaviness and weakness of the right hand. He denies any dysarthria or dizziness. No loss of consciousness, chest pain or new shortness of breath is reported. He does have severe reduced hearing per the patient. The review of systems otherwise negative.  GENERAL: This is a pleasant obese male in no acute distress.  HEENT: Supple. Atraumatic normocephalic. Severely impaired hearing is observed.  ABDOMEN: soft  EXTREMITIES: No edema   BACK: Normal.  SKIN: Normal by inspection.    MENTAL STATUS: Alert and oriented. Speech, language and cognition are generally intact. Judgment and insight normal.   CRANIAL NERVES: Pupils are equal, round and reactive to light and accommodation; extra ocular movements are full, there is no significant nystagmus; visual fields are full; upper and lower facial muscles are normal in strength and symmetric, there is no flattening of the nasolabial folds; tongue is midline; uvula is midline; shoulder elevation is normal.  MOTOR: Normal tone, bulk and strength; no pronator drift.  COORDINATION: Left finger to nose is normal, right finger to nose is normal, No rest tremor; no intention tremor; no postural tremor; no bradykinesia.  REFLEXES: Deep tendon reflexes are symmetrical and normal. Babinski reflexes are flexor bilaterally.   SENSATION: There is slightly reduced sensation right perioral area.   Blood pressure (!)  156/140, pulse 62, temperature 97.9 F (36.6 C), temperature source Oral, resp. rate 15, height _0  (1.676 m), weight 253 lb 12 oz (115.1 kg), SpO2 91 %.  Past Medical History:  Diagnosis Date  . Allergy   . Arthritis   . Asthma   . Cancer ([place])    kidney  . CLL (chronic lymphocytic leukemia) ([place]) 2016  . COPD (chronic obstructive pulmonary disease) ([place])   . Diverticulitis   . Diverticulosis    colonoscopy 3/15  . GERD (gastroesophageal reflux disease)   . Hypertension   . Low testosterone   . Migraine   . Shingles 03/31/2016  . Solar purpura ([place])   . Thyroid disease    hypothryoid  . Ulcer ([place])   . Ventral hernia FEB 2015 CT    Large infra umbilical ventral hernia containing loops of small    Past Surgical History:  Procedure Laterality Date  . abdominal mesh inserted  june 2006, may 2008,dec 2010  . CHOLECYSTECTOMY  june 2007  . COLON SURGERY  sept 2007  . COLONOSCOPY    . COLONOSCOPY N/A 02/20/2014   Procedure: COLONOSCOPY;  Surgeon: Danie Binder, MD;  Location: [facility];  Service: Endoscopy;  Laterality: N/A;  . HERNIA REPAIR  05/2005 5/20108, 11/2009   with mesh  . KIDNEY CYST REMOVAL  jan 2005   renal cell carcinoma  . knee minescus repair Left feb 2007  . NEPHRECTOMY      Family History  Problem Relation Age of Onset  . Cancer Mother     lung  . Colon cancer Neg Hx  Social History:  reports that he quit smoking about 20 years ago. He has never used smokeless tobacco. He reports that he drinks alcohol. He reports that he does not use drugs.  Allergies:  Allergies  Allergen Reactions  . Contrast Media [Iodinated Diagnostic Agents]     Single functioning kidney  . Other Hives    Neoprene fabric Strawberry   . Ether Rash    Took a long time to wake up Doesn't wake up from it    Medications: Prior to Admission medications   Medication Sig Start Date End Date Taking? Authorizing Provider  acetaminophen (TYLENOL) 325 MG tablet Take  650 mg by mouth every 6 (six) hours as needed.   Yes Historical Provider, MD  albuterol (PROVENTIL) (2.5 MG/3ML) 0.083% nebulizer solution TAKE 3MLS(1 VIAL) VIA NEBULIZER EVERY 6 HOURS AS NEEDED FOR WHEEZING OR SHORTNESS OF BREATH 11/08/16  Yes Alycia Rossetti, MD  allopurinol (ZYLOPRIM) 100 MG tablet TAKE 1 TABLET(100 MG) BY MOUTH AT BEDTIME 03/02/17  Yes Alycia Rossetti, MD  amLODipine (NORVASC) 5 MG tablet TAKE 1 TABLET(5 MG) BY MOUTH DAILY 12/24/16  Yes Alycia Rossetti, MD  ATROVENT HFA 17 MCG/ACT inhaler INHALE 2 PUFFS BY MOUTH INTO THE LUNGS EVERY 6 HOURS 11/08/16  Yes Alycia Rossetti, MD  calcium carbonate (TUMS - DOSED IN MG ELEMENTAL CALCIUM) 500 MG chewable tablet Chew 2-3 tablets by mouth once as needed for indigestion or heartburn. Reported on 11/21/2015   Yes Historical Provider, MD  citalopram (CELEXA) 10 MG tablet TAKE 1 TABLET(10 MG) BY MOUTH DAILY 01/26/17  Yes Alycia Rossetti, MD  diphenhydrAMINE (BENADRYL) 25 MG tablet Take 25 mg by mouth every evening.    Yes Historical Provider, MD  Glucosamine-Chondroit-Vit C-Mn (GLUCOSAMINE 1500 COMPLEX) CAPS Take 1,200 capsules by mouth 2 (two) times daily.   Yes Historical Provider, MD  HYDROcodone-acetaminophen (NORCO) 10-325 MG tablet Take 1-2 tablets by mouth every 8 (eight) hours as needed. 02/25/17  Yes Alycia Rossetti, MD  levalbuterol [facility] HFA) 45 MCG/ACT inhaler Inhale 1-2 puffs into the lungs every 4 (four) hours as needed for wheezing. 07/22/14  Yes Alycia Rossetti, MD  levothyroxine (SYNTHROID, LEVOTHROID) 50 MCG tablet TAKE 1 TABLET(50 MCG) BY MOUTH DAILY BEFORE BREAKFAST 01/06/17  Yes Alycia Rossetti, MD  loratadine (CLARITIN) 10 MG tablet Take 10 mg by mouth every morning.    Yes Historical Provider, MD  metoprolol (LOPRESSOR) 50 MG tablet TAKE 1 TABLET(50 MG) BY MOUTH TWICE DAILY 03/02/17  Yes Alycia Rossetti, MD  mometasone [facility] 60 METERED DOSES) 220 MCG/INH inhaler Inhale 2 puffs into the lungs 2 (two) times daily.  08/08/15  Yes Alycia Rossetti, MD  Multiple Vitamin (MULTIVITAMIN WITH MINERALS) TABS tablet Take 1 tablet by mouth daily.   Yes Historical Provider, MD  ranitidine (ZANTAC) 75 MG tablet Take 75 mg by mouth 2 (two) times daily.   Yes Historical Provider, MD  testosterone cypionate (DEPOTESTOSTERONE CYPIONATE) 200 MG/ML injection INJECT 1.5 MLS INTO THE MUSCLE EVERY 14 DAYS 11/17/16  Yes Alycia Rossetti, MD  vitamin B-12 (CYANOCOBALAMIN) 1000 MCG tablet Take 1,000 mcg by mouth every morning.    Yes Historical Provider, MD  vitamin C (ASCORBIC ACID) 500 MG tablet Take 500 mg by mouth 2 (two) times daily.   Yes Historical Provider, MD  vitamin E 400 UNIT capsule Take 400 Units by mouth every morning.    Yes Historical Provider, MD  DEXILANT 60 MG capsule TAKE 1 CAPSULE BY  MOUTH DAILY Patient not taking: Reported on 03/09/2017 01/28/17   Alycia Rossetti, MD  eletriptan (RELPAX) 20 MG tablet Take 1 tablet (20 mg total) by mouth as needed for migraine. One tablet by mouth at onset of headache. May repeat in 2 hours if headache persists or recurs. Patient not taking: Reported on 03/09/2017 01/16/14   Alycia Rossetti, MD  fluticasone furoate-vilanterol (BREO ELLIPTA) 100-25 MCG/INH AEPB Inhale 1 puff into the lungs daily. 11/10/16   Alycia Rossetti, MD  levofloxacin (LEVAQUIN) 500 MG tablet Take 1 tablet (500 mg total) by mouth daily. Patient not taking: Reported on 03/09/2017 11/10/16   Alycia Rossetti, MD    Scheduled Meds: . allopurinol  100 mg Oral Daily  . amLODipine  5 mg Oral Daily  . atenolol  50 mg Oral Daily  . citalopram  10 mg Oral Daily  . clopidogrel  75 mg Oral Daily  . fluticasone furoate-vilanterol  1 puff Inhalation Daily  . heparin  5,000 Units Subcutaneous Q8H  . levothyroxine  50 mcg Oral QAC breakfast  . pantoprazole  40 mg Oral Daily  . sodium chloride flush  3 mL Intravenous Q12H  . vitamin C  500 mg Oral BID   Continuous Infusions: PRN Meds:.acetaminophen, albuterol,  HYDROcodone-acetaminophen, ondansetron (ZOFRAN) IV     Results for orders placed or performed during the hospital encounter of 03/08/17 (from the past 48 hour(s))  Ethanol     Status: None   Collection Time: 03/08/17 11:45 PM  Result Value Ref Range   Alcohol, Ethyl (B) <5 <5 mg/dL    Comment:        LOWEST DETECTABLE LIMIT FOR SERUM ALCOHOL IS 5 mg/dL FOR MEDICAL PURPOSES ONLY   Protime-INR     Status: None   Collection Time: 03/08/17 11:45 PM  Result Value Ref Range   Prothrombin Time 13.6 11.4 - 15.2 seconds   INR 1.04   APTT     Status: None   Collection Time: 03/08/17 11:45 PM  Result Value Ref Range   aPTT 26 24 - 36 seconds  CBC     Status: Abnormal   Collection Time: 03/08/17 11:45 PM  Result Value Ref Range   WBC 55.1 (HH) 4.0 - 10.5 K/uL    Comment: CRITICAL RESULT CALLED TO, READ BACK BY AND VERIFIED WITH:  BELTON,K @ 0009 ON 03/09/17 BY JUW    RBC 5.80 4.22 - 5.81 MIL/uL   Hemoglobin 16.6 13.0 - 17.0 g/dL   HCT 50.8 39.0 - 52.0 %   MCV 87.6 78.0 - 100.0 fL   MCH 28.6 26.0 - 34.0 pg   MCHC 32.7 30.0 - 36.0 g/dL   RDW 16.1 (H) 11.5 - 15.5 %   Platelets 146 (L) 150 - 400 K/uL  Differential     Status: Abnormal   Collection Time: 03/08/17 11:45 PM  Result Value Ref Range   Neutrophils Relative % 17 %   Lymphocytes Relative 82 %   Monocytes Relative 1 %   Eosinophils Relative 0 %   Basophils Relative 0 %   Band Neutrophils 0 %   Metamyelocytes Relative 0 %   Myelocytes 0 %   Promyelocytes Absolute 0 %   Blasts 0 %   nRBC 0 0 /100 WBC   Other 0 %   Neutro Abs 9.4 (H) 1.7 - 7.7 K/uL   Lymphs Abs 45.1 (H) 0.7 - 4.0 K/uL   Monocytes Absolute 0.6 0.1 - 1.0 K/uL   Eosinophils  Absolute 0.0 0.0 - 0.7 K/uL   Basophils Absolute 0.0 0.0 - 0.1 K/uL   WBC Morphology ATYPICAL LYMPHOCYTES   Comprehensive metabolic panel     Status: Abnormal   Collection Time: 03/08/17 11:45 PM  Result Value Ref Range   Sodium 136 135 - 145 mmol/L   Potassium 4.2 3.5 - 5.1 mmol/L     Chloride 105 101 - 111 mmol/L   CO2 24 22 - 32 mmol/L   Glucose, Bld 133 (H) 65 - 99 mg/dL   BUN 15 6 - 20 mg/dL   Creatinine, Ser 1.52 (H) 0.61 - 1.24 mg/dL   Calcium 8.9 8.9 - 10.3 mg/dL   Total Protein 6.5 6.5 - 8.1 g/dL   Albumin 4.2 3.5 - 5.0 g/dL   AST 20 15 - 41 U/L   ALT 22 17 - 63 U/L   Alkaline Phosphatase 41 38 - 126 U/L   Total Bilirubin 0.5 0.3 - 1.2 mg/dL   GFR calc non Af Amer 46 (L) >60 mL/min   GFR calc Af Amer 53 (L) >60 mL/min    Comment: (NOTE) The eGFR has been calculated using the CKD EPI equation. This calculation has not been validated in all clinical situations. eGFR's persistently <60 mL/min signify possible Chronic Kidney Disease.    Anion gap 7 5 - 15  TSH     Status: None   Collection Time: 03/08/17 11:45 PM  Result Value Ref Range   TSH 1.632 0.350 - 4.500 uIU/mL    Comment: Performed by a 3rd Generation assay with a functional sensitivity of <=0.01 uIU/mL.  I-stat troponin, ED (not at [facility], [facility])     Status: None   Collection Time: 03/08/17 11:51 PM  Result Value Ref Range   Troponin i, poc 0.00 0.00 - 0.08 ng/mL   Comment 3            Comment: Due to the release kinetics of cTnI, a negative result within the first hours of the onset of symptoms does not rule out myocardial infarction with certainty. If myocardial infarction is still suspected, repeat the test at appropriate intervals.   I-Stat Chem 8, ED  (not at [facility], [facility])     Status: Abnormal   Collection Time: 03/08/17 11:53 PM  Result Value Ref Range   Sodium 139 135 - 145 mmol/L   Potassium 4.3 3.5 - 5.1 mmol/L   Chloride 104 101 - 111 mmol/L   BUN 16 6 - 20 mg/dL   Creatinine, Ser 1.60 (H) 0.61 - 1.24 mg/dL   Glucose, Bld 133 (H) 65 - 99 mg/dL   Calcium, Ion 1.14 (L) 1.15 - 1.40 mmol/L   TCO2 25 0 - 100 mmol/L   Hemoglobin 17.7 (H) 13.0 - 17.0 g/dL   HCT 52.0 39.0 - 52.0 %  Urine rapid drug screen (hosp performed)not at [facility]     Status: Abnormal   Collection Time: 03/09/17   1:05 AM  Result Value Ref Range   Opiates POSITIVE (A) NONE DETECTED   Cocaine NONE DETECTED NONE DETECTED   Benzodiazepines NONE DETECTED NONE DETECTED   Amphetamines NONE DETECTED NONE DETECTED   Tetrahydrocannabinol NONE DETECTED NONE DETECTED   Barbiturates NONE DETECTED NONE DETECTED    Comment:        DRUG SCREEN FOR MEDICAL PURPOSES ONLY.  IF CONFIRMATION IS NEEDED FOR ANY PURPOSE, NOTIFY LAB WITHIN 5 DAYS.        LOWEST DETECTABLE LIMITS FOR URINE DRUG SCREEN Drug Class  Cutoff (ng/mL) Amphetamine      1000 Barbiturate      200 Benzodiazepine   625 Tricyclics       638 Opiates          300 Cocaine          300 THC              50   Urinalysis, Routine w reflex microscopic     Status: None   Collection Time: 03/09/17  1:05 AM  Result Value Ref Range   Color, Urine YELLOW YELLOW   APPearance CLEAR CLEAR   Specific Gravity, Urine 1.016 1.005 - 1.030   pH 5.0 5.0 - 8.0   Glucose, UA NEGATIVE NEGATIVE mg/dL   Hgb urine dipstick NEGATIVE NEGATIVE   Bilirubin Urine NEGATIVE NEGATIVE   Ketones, ur NEGATIVE NEGATIVE mg/dL   Protein, ur NEGATIVE NEGATIVE mg/dL   Nitrite NEGATIVE NEGATIVE   Leukocytes, UA NEGATIVE NEGATIVE  MRSA PCR Screening     Status: None   Collection Time: 03/09/17  4:07 AM  Result Value Ref Range   MRSA by PCR NEGATIVE NEGATIVE    Comment:        The GeneXpert MRSA Assay (FDA approved for NASAL specimens only), is one component of a comprehensive MRSA colonization surveillance program. It is not intended to diagnose MRSA infection nor to guide or monitor treatment for MRSA infections.   CBC     Status: Abnormal   Collection Time: 03/09/17  6:56 AM  Result Value Ref Range   WBC 45.2 (H) 4.0 - 10.5 K/uL   RBC 5.52 4.22 - 5.81 MIL/uL   Hemoglobin 15.6 13.0 - 17.0 g/dL   HCT 48.2 39.0 - 52.0 %   MCV 87.3 78.0 - 100.0 fL   MCH 28.3 26.0 - 34.0 pg   MCHC 32.4 30.0 - 36.0 g/dL   RDW 16.1 (H) 11.5 - 15.5 %   Platelets 136 (L) 150 - 400  K/uL    Studies/Results:  BRAIN MRI MRA IMPRESSION: 1. Acute left thalamic infarct. 2. Mild chronic small vessel ischemic disease. 3. No large vessel occlusion. Mild stenosis of the nondominant distal left vertebral artery.   CAROTID DOPPLERS FINE     Damacio Weisgerber A. Merlene Laughter, M.D.  Diplomate, Tax adviser of Psychiatry and Neurology ( Neurology). 03/09/2017, 8:19 PM

## 2017-03-10 ENCOUNTER — Inpatient Hospital Stay (HOSPITAL_COMMUNITY): Payer: Medicare Other

## 2017-03-10 DIAGNOSIS — I509 Heart failure, unspecified: Secondary | ICD-10-CM

## 2017-03-10 LAB — HEMOGLOBIN A1C
Hgb A1c MFr Bld: 5.8 % — ABNORMAL HIGH (ref 4.8–5.6)
Mean Plasma Glucose: 120 mg/dL

## 2017-03-10 LAB — ECHOCARDIOGRAM COMPLETE
HEIGHTINCHES: 66 in
WEIGHTICAEL: 4059.99 [oz_av]

## 2017-03-10 LAB — LIPID PANEL
CHOLESTEROL: 130 mg/dL (ref 0–200)
HDL: 21 mg/dL — ABNORMAL LOW (ref >40)
LDL Cholesterol: 70 mg/dL (ref 0–99)
Total CHOL/HDL Ratio: 6.2 RATIO
Triglycerides: 196 mg/dL — ABNORMAL HIGH (ref <150)
VLDL: 39 mg/dL (ref 0–40)

## 2017-03-10 MED ORDER — CLOPIDOGREL BISULFATE 75 MG PO TABS: 75.0000 mg | ORAL_TABLET | Freq: Every day | ORAL | 0 refills | Status: DC

## 2017-03-10 NOTE — Progress Notes (Signed)
Pt & wife given &discussed AVS 7 Questions answered. Pt transported to family vehicle via Wheelchair

## 2017-03-10 NOTE — Progress Notes (Signed)
*  PRELIMINARY RESULTS* Echocardiogram 2D Echocardiogram has been performed.  [name] Ball 03/10/2017, 4:21 PM

## 2017-03-10 NOTE — Evaluation (Signed)
Occupational Therapy Evaluation Patient Details Name: [name] Ball MRN: [id number] DOB: 1951-06-13 Today's Date: 03/10/2017    History of Present Illness 66 y.o. male with hx of migaine HA, allergy, HTN, COPD, CLL, acquired one Ball with CKD3, hx of Ball cancer, GERD, hypothyrodism, presented to the ER with rightsided HA and right facial numbness with right arm numbness and weakness.  He arrived as code stroke, but not a candidate for TPA as he was improving, and ictus was outside of 4 hours TPA window.  He denied lower extrmity weakness, but had some slurred speech.  He improved completely, except for slight numbness of his face.  Wife said he has had frequent HA of late.  Work up in the ER included a head CT which was negative.  EKG showed NSR and serology was unremarkable.  MRI: Acute left thalamic infarct.   Clinical Impression   Pt and wife present during OT evaluation. Patient reports being tired from previous day of many tests. Patient does have some right side weakness present and OP OT is recommended. Wife and patient report that they will wait a little bit to see if his weakness resides and if it doesn't they will be open to OP rehab to improve his right side weakness. In the mean time, patient was encouraged to use his right side for all daily tasks as much as possible and work on strengthening it at home with weights.     Follow Up Recommendations  Outpatient OT    Equipment Recommendations  None recommended by OT    Recommendations for Other Services Speech consult (Facial numbness is still present)     Precautions / Restrictions Precautions Precautions: Fall Precaution Comments: Pt reports 1-2 falls in the past 6 months where his LE's give out due to his knees being bone on bone.  Restrictions Weight Bearing Restrictions: No             ADL either performed or assessed with clinical judgement   ADL Overall ADL's : At baseline;Modified independent                                              Vision Baseline Vision/History: Wears glasses Wears Glasses: Reading only Patient Visual Report: Diplopia (right eye only) Vision Assessment?: Yes Eye Alignment: Within Functional Limits Ocular Range of Motion: Within Functional Limits Alignment/Gaze Preference: Within Defined Limits Convergence: Within functional limits Visual Fields: Other (comment) Diplopia Assessment: Present all the time/all directions (right eye) Depth Perception: Overshoots Additional Comments: Mild decrease in right side peripheral visual field. Functional.      Perception Perception Spatial deficits: None noted.    Praxis Praxis Praxis tested?: Within functional limits    Pertinent Vitals/Pain Pain Assessment: No/denies pain     Hand Dominance Left   Extremity/Trunk Assessment Upper Extremity Assessment Upper Extremity Assessment: RUE deficits/detail RUE Deficits / Details: Shoulder strength (flexion, abduction, and external rotation): 4/5 Internal rotation: 5/5. A/ROM is WNL. Decreased gross grip strength. Fine and gross motor coordination appear to be intact.    Lower Extremity Assessment Lower Extremity Assessment: Defer to PT evaluation       Communication Communication Communication: HOH (Wife uses American Sign Language to communicate when he does not understand or can hear what you say.)   Cognition Arousal/Alertness: Awake/alert Behavior During Therapy: WFL for tasks assessed/performed Overall Cognitive Status: Within Functional Limits  for tasks assessed                                                Home Living Family/patient expects to be discharged to:: Private residence Living Arrangements: Spouse/significant other Available Help at Discharge: Available 24 hours/day Type of Home: House Home Access: Stairs to enter [employer] of Steps: 3 with HR   Home Layout: One level     Bathroom  Shower/Tub: Occupational psychologist: Handicapped height     Home Equipment: Grab bars - tub/shower;Cane - single point;Shower seat          Prior Functioning/Environment Level of Independence: Independent with assistive device(s)  Gait / Transfers Assistance Needed: Mod Independent with unlimited community ambulation distances - uses a cane when his knees bother him.  Wife states they have a very active lifestyle and travel often as [employer].  ADL's / Homemaking Assistance Needed: independent   Comments: Pt still drives                 OT Goals(Current goals can be found in the care plan section) Acute Rehab OT Goals Patient Stated Goal: To go home.       End of Session    Activity Tolerance: Patient tolerated treatment well Patient left: in bed;with call bell/phone within reach;with family/visitor present  OT Visit Diagnosis: Muscle weakness (generalized) (M62.81);Hemiplegia and hemiparesis Hemiplegia - Right/Left: Right Hemiplegia - dominant/non-dominant: Non-Dominant Hemiplegia - caused by: Cerebral infarction                Time: 6073-7106 OT Time Calculation (min): 17 min Charges:  OT General Charges $OT Visit: 1 Procedure OT Evaluation $OT Eval Low Complexity: 1 Procedure G-Codes: OT G-codes **NOT FOR INPATIENT CLASS** Functional Assessment Tool Used: AM-PAC 6 Clicks Daily Activity Functional Limitation: Self care Self Care Current Status (Y6948): 0 percent impaired, limited or restricted Self Care Goal Status (N4627): 0 percent impaired, limited or restricted Self Care Discharge Status (O3500): 0 percent impaired, limited or restricted   Ailene Ravel, OTR/L,CBIS  [phone]   Emya Picado, Clarene Duke 03/10/2017, 8:58 AM

## 2017-03-10 NOTE — Discharge Instructions (Signed)
Follow with Primary MD Vic Blackbird, MD in 7 days   Get CBC, CMP, 2 view Chest X ray checked  by Primary MD or SNF MD in 5-7 days ( we routinely change or add medications that can affect your baseline labs and fluid status, therefore we recommend that you get the mentioned basic workup next visit with your PCP, your PCP may decide not to get them or add new tests based on their clinical decision)  Activity: As tolerated with Full fall precautions use walker/cane & assistance as needed  Disposition Home    Diet: Heart Healthy    For Heart failure patients - Check your Weight same time everyday, if you gain over 2 pounds, or you develop in leg swelling, experience more shortness of breath or chest pain, call your Primary MD immediately. Follow Cardiac Low Salt Diet and 1.5 lit/day fluid restriction.  On your next visit with your primary care physician please Get Medicines reviewed and adjusted.  Please request your Prim.MD to go over all Hospital Tests and Procedure/Radiological results at the follow up, please get all Hospital records sent to your Prim MD by signing hospital release before you go home.  If you experience worsening of your admission symptoms, develop shortness of breath, life threatening emergency, suicidal or homicidal thoughts you must seek medical attention immediately by calling 911 or calling your MD immediately  if symptoms less severe.  You Must read complete instructions/literature along with all the possible adverse reactions/side effects for all the Medicines you take and that have been prescribed to you. Take any new Medicines after you have completely understood and accpet all the possible adverse reactions/side effects.   Do not drive, operate heavy machinery, perform activities at heights, swimming or participation in water activities or provide baby sitting services if your were admitted for syncope or siezures until you have seen by Primary MD or a Neurologist  and advised to do so again.  Do not drive when taking Pain medications.    Do not take more than prescribed Pain, Sleep and Anxiety Medications  Special Instructions: If you have smoked or chewed Tobacco  in the last 2 yrs please stop smoking, stop any regular Alcohol  and or any Recreational drug use.  Wear Seat belts while driving.   Please note  You were cared for by a hospitalist during your hospital stay. If you have any questions about your discharge medications or the care you received while you were in the hospital after you are discharged, you can call the unit and asked to speak with the hospitalist on call if the hospitalist that took care of you is not available. Once you are discharged, your primary care physician will handle any further medical issues. Please note that NO REFILLS for any discharge medications will be authorized once you are discharged, as it is imperative that you return to your primary care physician (or establish a relationship with a primary care physician if you do not have one) for your aftercare needs so that they can reassess your need for medications and monitor your lab values.

## 2017-03-10 NOTE — Discharge Summary (Signed)
[name] Ball ZYY:[id number] DOB: 10-30-1951 DOA: 03/08/2017  PCP: Vic Blackbird, MD  Admit date: 03/08/2017  Discharge date: 03/10/2017  Admitted From: Home  Disposition:  Home   Recommendations for Outpatient Follow-up:   Follow up with PCP in 1-2 weeks  PCP Please obtain BMP/CBC, 2 view CXR in 1week  (see Discharge instructions)   PCP Please follow up on the following pending results: Follow final Echo report.     Daisy Lazar Spouse [phone]      Home Health: None   Equipment/Devices: None  Consultations: Neuro Discharge Condition: Stable   CODE STATUS: Full   Diet Recommendation:  Heart Healthy    Chief Complaint  Patient presents with  . Numbness     Brief history of present illness from the day of admission and additional interim summary    [name] Ball an 66 y.o.malewith hx of migaine HA, allergy, HTN, COPD, CLL, acquired one kidney with CKD3, hx of kidney cancer, GERD, hypothyrodism, presented to the ER with rightsided HA and right facial numbness with right arm numbness and weakness, due to acute CVA .                                                                 Hospital Course   1. CVA - MRI showing Acute left thalamic infarct, minimal sypmtoms, Placed on Plavix (no ASA as 1 kidney as told by Nephrologist not to take) , stable echocardiogram (prelim report), carotid duplex along with A1c and lipid panel, was be seen by PT, OT ,  Speech ( not needed) &  Neurology consulted. Will Follow with neurology post discharge.  2. CLL - stable, no acute issues, follow with PCP and Primary Oncologist post DC.  3. HTN - blood pressure stable on Norvasc and beta blocker.  4. Hypothyroidism. Continue home dose Synthroid.  5. GERD. On PPI continue.  6. Gout. Stable on  allopurinol.  7.Depression - Celexa continued.  8. CKD 4 with Only one kidney, one kidney removed due to history of cancer. Creatinine at baseline.   Discharge diagnosis     Principal Problem:   Complicated migraine Active Problems:   TIA (transient ischemic attack)   COPD (chronic obstructive pulmonary disease) ([place])   Hypertension   Asthma    Discharge instructions    Discharge Instructions    Ambulatory referral to Occupational Therapy    Complete by:  As directed    Ambulatory referral to Physical Therapy    Complete by:  As directed    Diet - low sodium heart healthy    Complete by:  As directed    Discharge instructions    Complete by:  As directed    Follow with Primary MD Vic Blackbird, MD in 7 days   Get CBC, CMP, 2 view  Chest X ray checked  by Primary MD or SNF MD in 5-7 days ( we routinely change or add medications that can affect your baseline labs and fluid status, therefore we recommend that you get the mentioned basic workup next visit with your PCP, your PCP may decide not to get them or add new tests based on their clinical decision)  Activity: As tolerated with Full fall precautions use walker/cane & assistance as needed  Disposition Home    Diet: Heart Healthy    For Heart failure patients - Check your Weight same time everyday, if you gain over 2 pounds, or you develop in leg swelling, experience more shortness of breath or chest pain, call your Primary MD immediately. Follow Cardiac Low Salt Diet and 1.5 lit/day fluid restriction.  On your next visit with your primary care physician please Get Medicines reviewed and adjusted.  Please request your Prim.MD to go over all Hospital Tests and Procedure/Radiological results at the follow up, please get all Hospital records sent to your Prim MD by signing hospital release before you go home.  If you experience worsening of your admission symptoms, develop shortness of breath, life threatening  emergency, suicidal or homicidal thoughts you must seek medical attention immediately by calling 911 or calling your MD immediately  if symptoms less severe.  You Must read complete instructions/literature along with all the possible adverse reactions/side effects for all the Medicines you take and that have been prescribed to you. Take any new Medicines after you have completely understood and accpet all the possible adverse reactions/side effects.   Do not drive, operate heavy machinery, perform activities at heights, swimming or participation in water activities or provide baby sitting services if your were admitted for syncope or siezures until you have seen by Primary MD or a Neurologist and advised to do so again.  Do not drive when taking Pain medications.    Do not take more than prescribed Pain, Sleep and Anxiety Medications  Special Instructions: If you have smoked or chewed Tobacco  in the last 2 yrs please stop smoking, stop any regular Alcohol  and or any Recreational drug use.  Wear Seat belts while driving.   Please note  You were cared for by a hospitalist during your hospital stay. If you have any questions about your discharge medications or the care you received while you were in the hospital after you are discharged, you can call the unit and asked to speak with the hospitalist on call if the hospitalist that took care of you is not available. Once you are discharged, your primary care physician will handle any further medical issues. Please note that NO REFILLS for any discharge medications will be authorized once you are discharged, as it is imperative that you return to your primary care physician (or establish a relationship with a primary care physician if you do not have one) for your aftercare needs so that they can reassess your need for medications and monitor your lab values.   Increase activity slowly    Complete by:  As directed       Discharge Medications    Allergies as of 03/10/2017      Reactions   Contrast Media [iodinated Diagnostic Agents]    Single functioning kidney   Other Hives   Neoprene fabric Strawberry   Ether Rash   Took a long time to wake up Doesn't wake up from it      Medication List    TAKE  these medications   acetaminophen 325 MG tablet Commonly known as:  TYLENOL Take 650 mg by mouth every 6 (six) hours as needed.   albuterol (2.5 MG/3ML) 0.083% nebulizer solution Commonly known as:  PROVENTIL TAKE 3MLS(1 VIAL) VIA NEBULIZER EVERY 6 HOURS AS NEEDED FOR WHEEZING OR SHORTNESS OF BREATH   allopurinol 100 MG tablet Commonly known as:  ZYLOPRIM TAKE 1 TABLET(100 MG) BY MOUTH AT BEDTIME   amLODipine 5 MG tablet Commonly known as:  NORVASC TAKE 1 TABLET(5 MG) BY MOUTH DAILY   ATROVENT HFA 17 MCG/ACT inhaler Generic drug:  ipratropium INHALE 2 PUFFS BY MOUTH INTO THE LUNGS EVERY 6 HOURS   calcium carbonate 500 MG chewable tablet Commonly known as:  TUMS - dosed in mg elemental calcium Chew 2-3 tablets by mouth once as needed for indigestion or heartburn. Reported on 11/21/2015   citalopram 10 MG tablet Commonly known as:  CELEXA TAKE 1 TABLET(10 MG) BY MOUTH DAILY   clopidogrel 75 MG tablet Commonly known as:  PLAVIX Take 1 tablet (75 mg total) by mouth daily. Start taking on:  03/11/2017   DEXILANT 60 MG capsule Generic drug:  dexlansoprazole TAKE 1 CAPSULE BY MOUTH DAILY   diphenhydrAMINE 25 MG tablet Commonly known as:  BENADRYL Take 25 mg by mouth every evening.   eletriptan 20 MG tablet Commonly known as:  RELPAX Take 1 tablet (20 mg total) by mouth as needed for migraine. One tablet by mouth at onset of headache. May repeat in 2 hours if headache persists or recurs.   fluticasone furoate-vilanterol 100-25 MCG/INH Aepb Commonly known as:  BREO ELLIPTA Inhale 1 puff into the lungs daily.   GLUCOSAMINE 1500 COMPLEX Caps Take 1,200 capsules by mouth 2 (two) times daily.    HYDROcodone-acetaminophen 10-325 MG tablet Commonly known as:  NORCO Take 1-2 tablets by mouth every 8 (eight) hours as needed.   levalbuterol 45 MCG/ACT inhaler Commonly known as:  XOPENEX HFA Inhale 1-2 puffs into the lungs every 4 (four) hours as needed for wheezing.   levofloxacin 500 MG tablet Commonly known as:  LEVAQUIN Take 1 tablet (500 mg total) by mouth daily.   levothyroxine 50 MCG tablet Commonly known as:  SYNTHROID, LEVOTHROID TAKE 1 TABLET(50 MCG) BY MOUTH DAILY BEFORE BREAKFAST   loratadine 10 MG tablet Commonly known as:  CLARITIN Take 10 mg by mouth every morning.   metoprolol 50 MG tablet Commonly known as:  LOPRESSOR TAKE 1 TABLET(50 MG) BY MOUTH TWICE DAILY   mometasone 220 MCG/INH inhaler Commonly known as:  ASMANEX 60 METERED DOSES Inhale 2 puffs into the lungs 2 (two) times daily.   multivitamin with minerals Tabs tablet Take 1 tablet by mouth daily.   ranitidine 75 MG tablet Commonly known as:  ZANTAC Take 75 mg by mouth 2 (two) times daily.   testosterone cypionate 200 MG/ML injection Commonly known as:  DEPOTESTOSTERONE CYPIONATE INJECT 1.5 MLS INTO THE MUSCLE EVERY 14 DAYS   vitamin B-12 1000 MCG tablet Commonly known as:  CYANOCOBALAMIN Take 1,000 mcg by mouth every morning.   vitamin C 500 MG tablet Commonly known as:  ASCORBIC ACID Take 500 mg by mouth 2 (two) times daily.   vitamin E 400 UNIT capsule Take 400 Units by mouth every morning.       Follow-up Information    Vic Blackbird, MD. Schedule an appointment as soon as possible for a visit in 1 week(s).   Specialty:  Family Medicine Contact information: [address]  [place] [address] [phone]        SETHI,PRAMOD, MD. Schedule an appointment as soon as possible for a visit in 2 week(s).   Specialties:  Neurology, Radiology Why:  [facility] Contact information: [address] [place] [address] [phone]           Major  procedures and Radiology Reports - PLEASE review detailed and final reports thoroughly  -     TTE  -  Preliminary report EF 55-60%, no PFO or blood clots.   [name] Ball OI Contrast  Result Date: 03/09/2017 CLINICAL DATA:  Headache with right facial and right upper extremity numbness. EXAM: MRI HEAD WITHOUT CONTRAST MRA HEAD WITHOUT CONTRAST TECHNIQUE: Multiplanar, multiecho pulse sequences of the brain and surrounding structures were obtained without intravenous contrast. Angiographic images of the head were obtained using MRA technique without contrast. COMPARISON:  Head CT 03/08/2017 FINDINGS: MRI HEAD FINDINGS Brain: There is a 1.1 cm acute infarct in the left thalamus. There is no evidence of intracranial hemorrhage, mass, midline shift, or extra-axial fluid collection. The ventricles and sulci are within normal limits for age. Periventricular white matter T2 hyperintensities are nonspecific but compatible with mild chronic small vessel ischemic disease. Vascular: Major intracranial vascular flow voids are preserved. Skull and upper cervical spine: No suspicious marrow lesion. Incidental arachnoid granulation in the occipital bone. Sinuses/Orbits: Unremarkable orbits. Clear paranasal sinuses. Left mastoid effusion. Other: None. MRA HEAD FINDINGS The visualized distal vertebral arteries are patent to the basilar with the right being dominant. There is mild narrowing of the left V4 segment distal to the PICA origin. Patent PICA and SCA origins are identified bilaterally. The basilar artery is widely patent. There is a patent right posterior communicating artery. PCAs are patent without evidence of significant proximal stenosis. The internal carotid arteries are widely patent from skullbase to carotid termini. ACAs and MCAs are patent without evidence of major branch occlusion or significant proximal stenosis. MCA branch vessel evaluation is mildly limited by motion artifact. No intracranial aneurysm is  identified. IMPRESSION: 1. Acute left thalamic infarct. 2. Mild chronic small vessel ischemic disease. 3. No large vessel occlusion. Mild stenosis of the nondominant distal left vertebral artery. Electronically Signed   By: Logan Bores M.D.   On: 03/09/2017 08:37   [name] Brain Wo Contrast  Result Date: 03/09/2017 CLINICAL DATA:  Headache with right facial and right upper extremity numbness. EXAM: MRI HEAD WITHOUT CONTRAST MRA HEAD WITHOUT CONTRAST TECHNIQUE: Multiplanar, multiecho pulse sequences of the brain and surrounding structures were obtained without intravenous contrast. Angiographic images of the head were obtained using MRA technique without contrast. COMPARISON:  Head CT 03/08/2017 FINDINGS: MRI HEAD FINDINGS Brain: There is a 1.1 cm acute infarct in the left thalamus. There is no evidence of intracranial hemorrhage, mass, midline shift, or extra-axial fluid collection. The ventricles and sulci are within normal limits for age. Periventricular white matter T2 hyperintensities are nonspecific but compatible with mild chronic small vessel ischemic disease. Vascular: Major intracranial vascular flow voids are preserved. Skull and upper cervical spine: No suspicious marrow lesion. Incidental arachnoid granulation in the occipital bone. Sinuses/Orbits: Unremarkable orbits. Clear paranasal sinuses. Left mastoid effusion. Other: None. MRA HEAD FINDINGS The visualized distal vertebral arteries are patent to the basilar with the right being dominant. There is mild narrowing of the left V4 segment distal to the PICA origin. Patent PICA and SCA origins are identified bilaterally. The basilar artery is widely patent. There is a patent right posterior  communicating artery. PCAs are patent without evidence of significant proximal stenosis. The internal carotid arteries are widely patent from skullbase to carotid termini. ACAs and MCAs are patent without evidence of major branch occlusion or significant proximal  stenosis. MCA branch vessel evaluation is mildly limited by motion artifact. No intracranial aneurysm is identified. IMPRESSION: 1. Acute left thalamic infarct. 2. Mild chronic small vessel ischemic disease. 3. No large vessel occlusion. Mild stenosis of the nondominant distal left vertebral artery. Electronically Signed   By: Logan Bores M.D.   On: 03/09/2017 08:37   US Carotid Bilateral  Result Date: 03/09/2017 CLINICAL DATA:  Vertigo. EXAM: BILATERAL CAROTID DUPLEX ULTRASOUND TECHNIQUE: Pearline Cables scale imaging, color Doppler and duplex ultrasound were performed of bilateral carotid and vertebral arteries in the neck. COMPARISON:  MRI 03/09/2017. FINDINGS: Criteria: Quantification of carotid stenosis is based on velocity parameters that correlate the residual internal carotid diameter with NASCET-based stenosis levels, using the diameter of the distal internal carotid lumen as the denominator for stenosis measurement. The following velocity measurements were obtained: RIGHT ICA:  101/26 cm/sec CCA:  962/95 cm/sec SYSTOLIC ICA/CCA RATIO:  1.0 DIASTOLIC ICA/CCA RATIO:  1.9 ECA:  144 cm/sec LEFT ICA:  97/29 cm/sec CCA:  284/13 cm/sec SYSTOLIC ICA/CCA RATIO:  0.9 DIASTOLIC ICA/CCA RATIO:  1.5 ECA:  224 cm/sec RIGHT CAROTID ARTERY: Mild right carotid bifurcation atherosclerotic vascular disease. No flow limiting stenosis. RIGHT VERTEBRAL ARTERY:  Patent with antegrade flow. LEFT CAROTID ARTERY: Mild left carotid bifurcation atherosclerotic vascular disease. No flow limiting stenosis. LEFT VERTEBRAL ARTERY:  Patent with antegrade flow. IMPRESSION: 1. Mild bilateral carotid bifurcation atherosclerotic vascular disease. No flow limiting stenosis. Degree of stenosis less than 50% bilaterally. 2. Vertebrals are patent with antegrade flow . Electronically Signed   By: Marcello Moores  Register   On: 03/09/2017 15:28   Ct Head Code Stroke W/o Cm  Result Date: 03/09/2017 CLINICAL DATA:  Code stroke.  66 y/o  M; right-sided weakness.  EXAM: CT HEAD WITHOUT CONTRAST TECHNIQUE: Contiguous axial images were obtained from the base of the skull through the vertex without intravenous contrast. COMPARISON:  None. FINDINGS: Brain: No evidence of acute infarction, hemorrhage, hydrocephalus, extra-axial collection or mass lesion/mass effect. Vascular: Moderate calcific atherosclerosis of cavernous and paraclinoid internal carotid arteries. Skull: Well-circumscribed right occipital lucent lesion probably representing an arachnoid granulation. No displaced calvarial fracture. Sinuses/Orbits: Mild right-sided maxillary sinus mucosal thickening. Partial opacification of the left mastoid air cells with sclerosis probably representing sequelae of chronic otomastoiditis. Other: 6 x 18 mm right occipital scalp lipoma. ASPECTS [facility] Stroke Program Early CT Score) - Ganglionic level infarction (caudate, lentiform nuclei, internal capsule, insula, M1-M3 cortex): 7 - Supraganglionic infarction (M4-M6 cortex): 3 Total score (0-10 with 10 being normal): 10 IMPRESSION: 1. No acute intracranial abnormality identified. If symptoms persist or if clinically indicated MRI is more sensitive for acute stroke. 2. ASPECTS is 10 These results were called by telephone at the time of interpretation on 03/09/2017 at 12:03 am to Dr. Ezequiel Essex , who verbally acknowledged these results. Electronically Signed   By: Kristine Garbe M.D.   On: 03/09/2017 00:03    Micro Results     Recent Results (from the past 240 hour(s))  MRSA PCR Screening     Status: None   Collection Time: 03/09/17  4:07 AM  Result Value Ref Range Status   MRSA by PCR NEGATIVE NEGATIVE Final    Comment:        The GeneXpert MRSA Assay (FDA approved for  NASAL specimens only), is one component of a comprehensive MRSA colonization surveillance program. It is not intended to diagnose MRSA infection nor to guide or monitor treatment for MRSA infections.     Today   Subjective     [name] Ball today has no headache,no chest abdominal pain,no new weakness tingling or numbness, feels much better wants to go home today.     Objective   Blood pressure (!) 157/72, pulse 62, temperature 97.7 F (36.5 C), temperature source Oral, resp. rate 15, height 5\' 6"  (1.676 m), weight 115.1 kg (253 lb 12 oz), SpO2 94 %.   Intake/Output Summary (Last 24 hours) at 03/10/17 1051 Last data filed at 03/10/17 0832  Gross per 24 hour  Intake             1440 ml  Output             1525 ml  Net              -85 ml    Exam Awake Alert, Oriented x 3, No new F.N deficits, Normal affect [place].AT,PERRAL Supple Neck,No JVD, No cervical lymphadenopathy appriciated.  Symmetrical Chest wall movement, Good air movement bilaterally, CTAB RRR,No Gallops,Rubs or new Murmurs, No Parasternal Heave +ve B.Sounds, Abd Soft, Non tender, No organomegaly appriciated, No rebound -guarding or rigidity. No Cyanosis, Clubbing or edema, No new Rash or bruise   Data Review   CBC w Diff: Lab Results  Component Value Date   WBC 45.2 (H) 03/09/2017   HGB 15.6 03/09/2017   HCT 48.2 03/09/2017   PLT 136 (L) 03/09/2017   LYMPHOPCT 82 03/08/2017   BANDSPCT 0 03/08/2017   MONOPCT 1 03/08/2017   EOSPCT 0 03/08/2017   BASOPCT 0 03/08/2017    CMP: Lab Results  Component Value Date   NA 139 03/08/2017   NA 138 01/31/2013   K 4.3 03/08/2017   CL 104 03/08/2017   CO2 24 03/08/2017   BUN 16 03/08/2017   BUN 19 01/31/2013   CREATININE 1.60 (H) 03/08/2017   CREATININE 1.54 (H) 11/10/2016   GLU 95 01/31/2013   PROT 6.5 03/08/2017   ALBUMIN 4.2 03/08/2017   BILITOT 0.5 03/08/2017   ALKPHOS 41 03/08/2017   AST 20 03/08/2017   ALT 22 03/08/2017  . Lab Results  Component Value Date   HGBA1C 5.8 (H) 03/09/2017   Lab Results  Component Value Date   CHOL 130 03/10/2017   HDL 21 (L) 03/10/2017   LDLCALC 70 03/10/2017   TRIG 196 (H) 03/10/2017   CHOLHDL 6.2 03/10/2017     Total Time in  preparing paper work, data evaluation and todays exam - 46 minutes  Lala Lund M.D on 03/10/2017 at 10:51 AM  [facility]   Office  [phone]

## 2017-03-18 ENCOUNTER — Encounter: Payer: Self-pay | Admitting: Family Medicine

## 2017-03-18 ENCOUNTER — Ambulatory Visit (INDEPENDENT_AMBULATORY_CARE_PROVIDER_SITE_OTHER): Payer: Medicare Other | Admitting: Family Medicine

## 2017-03-18 VITALS — BP 130/72 | HR 68 | Temp 97.7°F | Resp 18 | Ht 66.5 in | Wt 252.0 lb

## 2017-03-18 DIAGNOSIS — E6609 Other obesity due to excess calories: Secondary | ICD-10-CM | POA: Diagnosis not present

## 2017-03-18 DIAGNOSIS — Z8673 Personal history of transient ischemic attack (TIA), and cerebral infarction without residual deficits: Secondary | ICD-10-CM | POA: Diagnosis not present

## 2017-03-18 DIAGNOSIS — C9110 Chronic lymphocytic leukemia of B-cell type not having achieved remission: Secondary | ICD-10-CM

## 2017-03-18 DIAGNOSIS — I10 Essential (primary) hypertension: Secondary | ICD-10-CM

## 2017-03-18 DIAGNOSIS — Z6841 Body Mass Index (BMI) 40.0 and over, adult: Secondary | ICD-10-CM | POA: Diagnosis not present

## 2017-03-18 DIAGNOSIS — N183 Chronic kidney disease, stage 3 unspecified: Secondary | ICD-10-CM

## 2017-03-18 DIAGNOSIS — IMO0001 Reserved for inherently not codable concepts without codable children: Secondary | ICD-10-CM

## 2017-03-18 DIAGNOSIS — R7303 Prediabetes: Secondary | ICD-10-CM | POA: Diagnosis not present

## 2017-03-18 IMAGING — DX DG CHEST 2V
2 series · 2 of 2 positions shown · non-contrast
Comparison: Radiographs February 18, 2014.

CLINICAL DATA: Shortness of breath.

EXAM:
CHEST  2 VIEW

[chest pa]
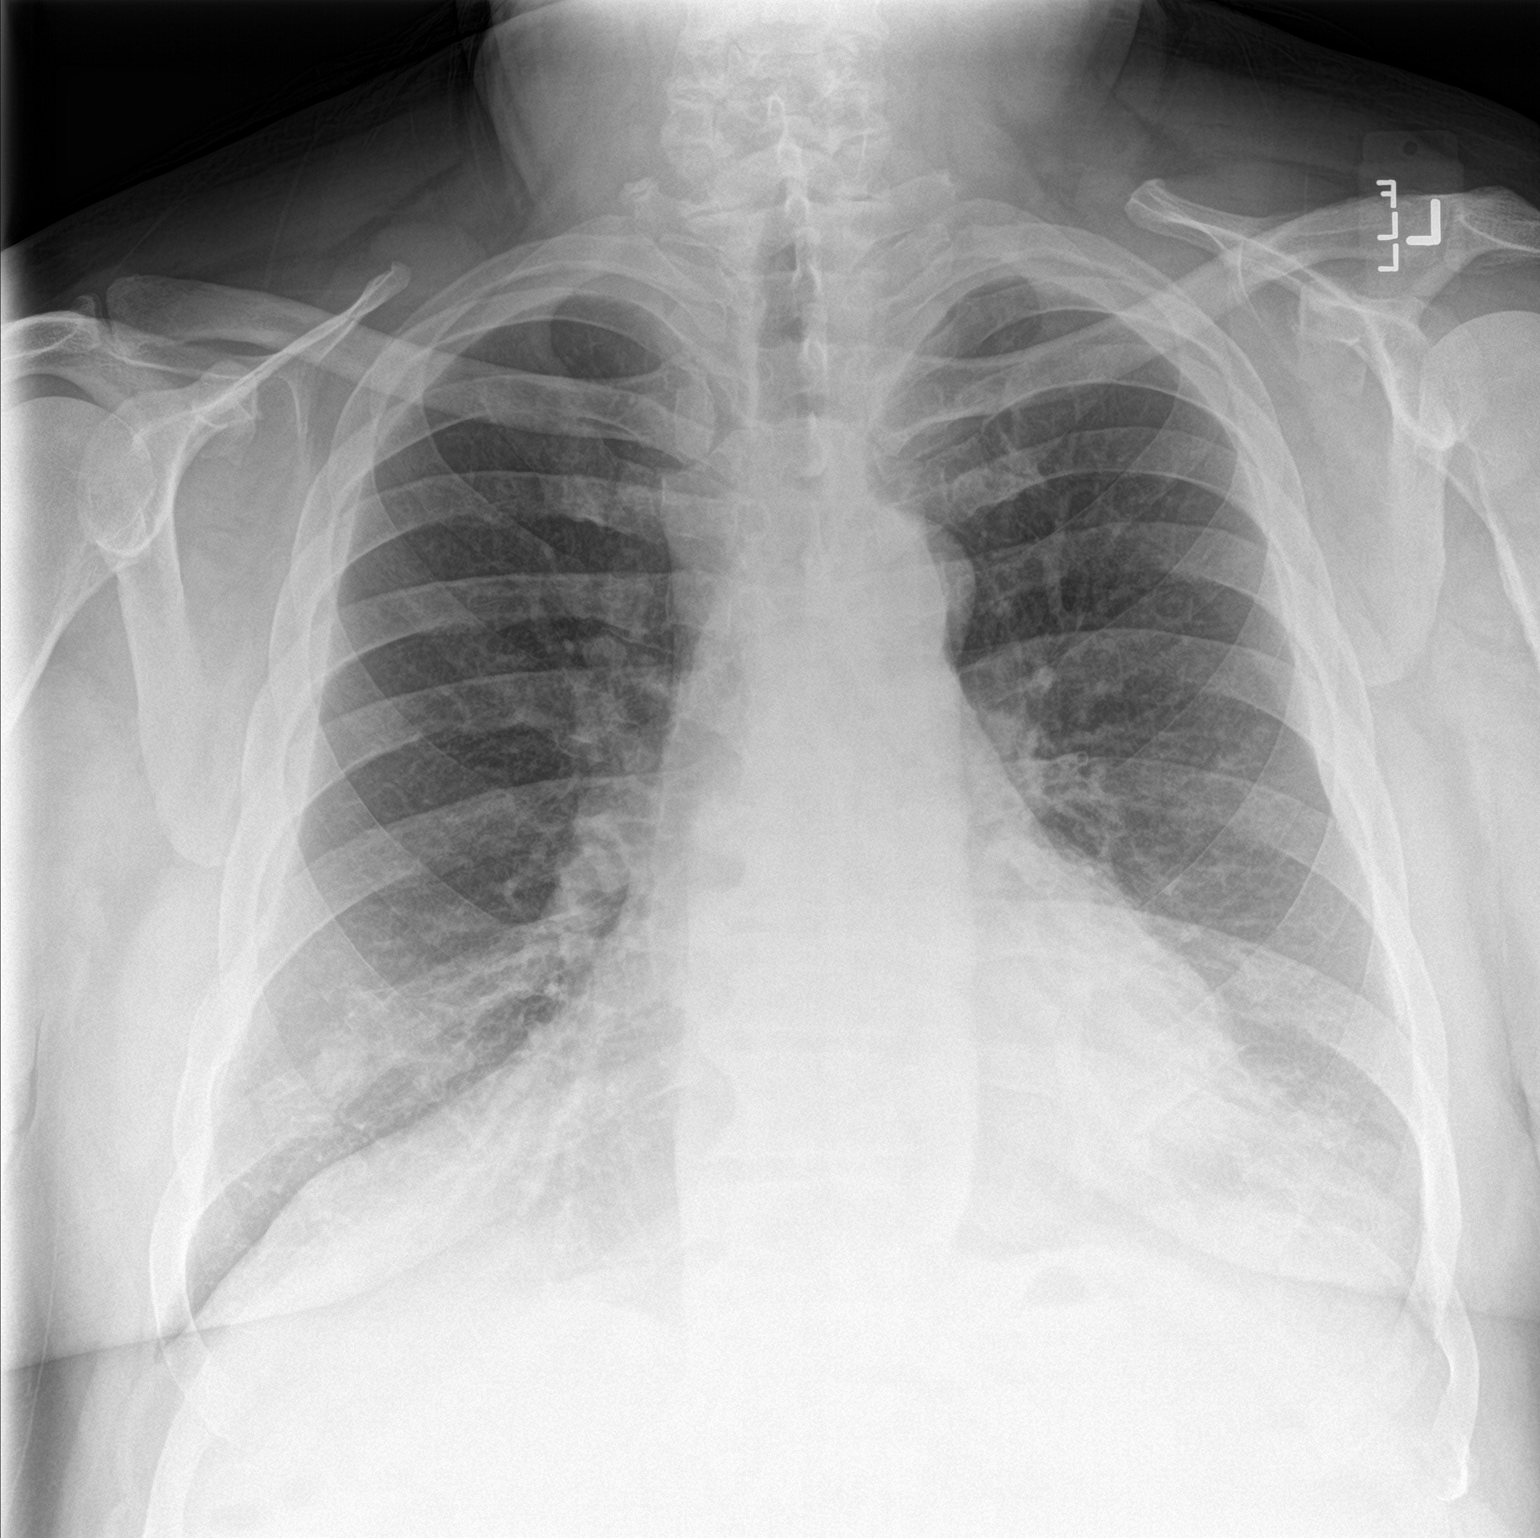

[chest lat]
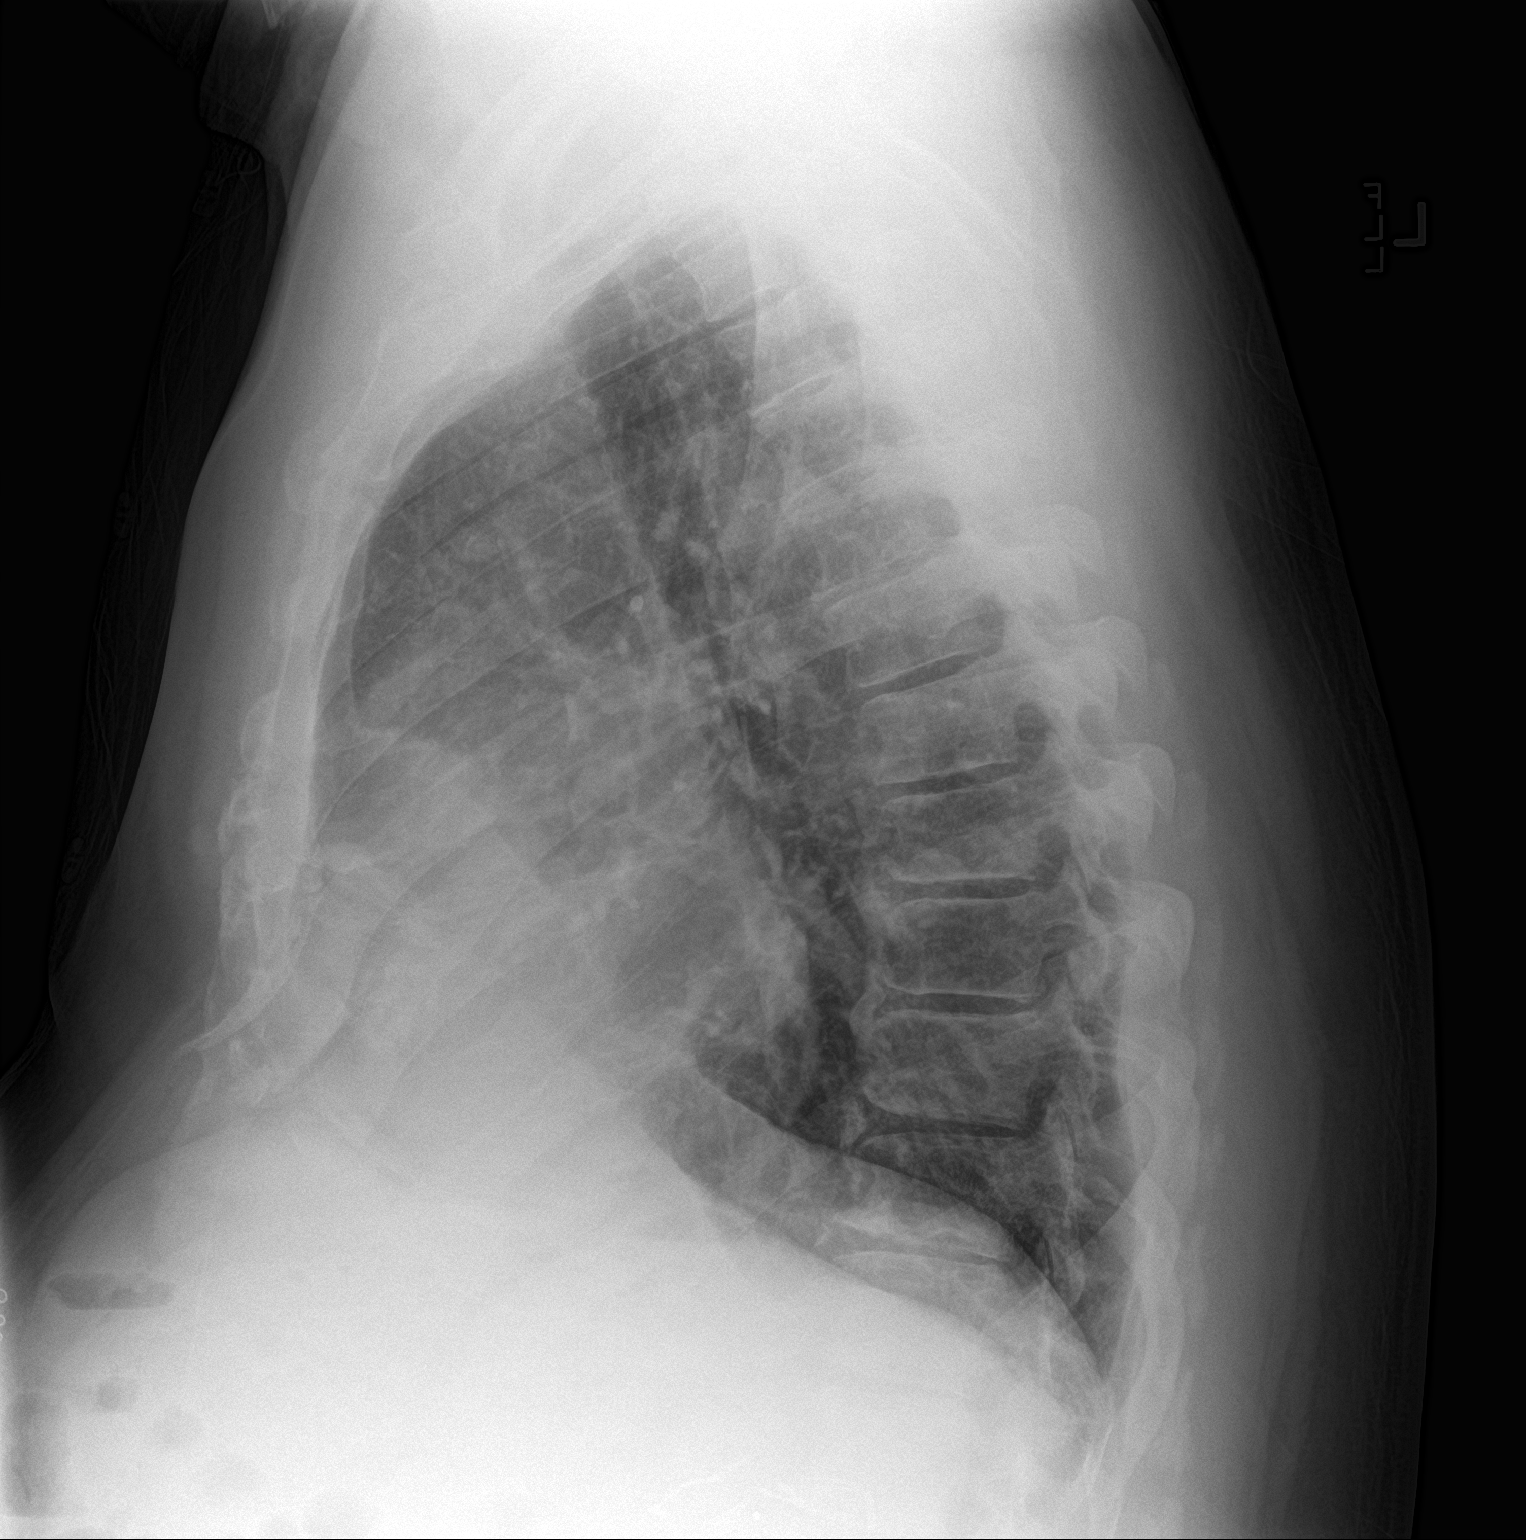

[2 of 2 positions shown; findings below may reference images not displayed]

FINDINGS: The heart size and mediastinal contours are within normal limits.
Both lungs are clear. No pneumothorax or significant pleural
effusion is noted. The visualized skeletal structures are
unremarkable.
IMPRESSION: No active cardiopulmonary disease.

## 2017-03-18 MED ORDER — HYDROCODONE-ACETAMINOPHEN 10-325 MG PO TABS: 1.0000 | ORAL_TABLET | Freq: Three times a day (TID) | ORAL | 0 refills | Status: DC | PRN

## 2017-03-18 NOTE — Progress Notes (Signed)
   Subjective:    Patient ID: [name] Ball, male    DOB: 1951/11/13, 66 y.o.   MRN: [id number]  Patient presents for Hospital F/U ([facility]- R sided weakness)   Pt here for hospital follow up.He was admitted secondary to right-sided headache and facial numbness weakness found to have acute CVA. MRI showed acute left thalamic infarct. He was placed on Plavix he had an echocardiogram done which not show anything acute. He had carotid ultrasound done as well- this showed mild atherosclerotic disease with less than 50% stenosis. Neurology was consult at   His blood pressure remained controlled there were no changes in his medications. He is also continued on his home dose of levothyroxine. His chronic kidney disease his creatinine was at baseline. A1c obtained which was 5.8% Lipid panel showed LDL of 70 triglycerides were mildly elevated at 196 but this was an improvement from previous his VLDL was normal at 39  History COPD was stable/ CLL stable during admission   Review Of Systems:  GEN- denies fatigue, fever, weight loss,weakness, recent illness HEENT- denies eye drainage, change in vision, nasal discharge, CVS- denies chest pain, palpitations RESP- denies SOB, cough, wheeze ABD- denies N/V, change in stools, abd pain GU- denies dysuria, hematuria, dribbling, incontinence MSK- denies joint pain, muscle aches, injury Neuro- denies headache, dizziness, syncope, seizure activity       Objective:    BP 130/72   Pulse 68   Temp 97.7 F (36.5 C) (Oral)   Resp 18   Ht 5' 6.5" (1.689 m)   Wt 252 lb (114.3 kg)   SpO2 98%   BMI 40.06 kg/m  GEN- NAD, alert and oriented x3 HEENT- PERRL, EOMI, non injected sclera, pink conjunctiva, MMM, oropharynx clear Neck- Supple, no thyromegaly CVS- RRR, no murmur RESP-CTAB ABD-NABS,soft,NT,ND EXT- No edema Pulses- Radial, DP- 2+        Assessment & Plan:      Problem List Items Addressed This Visit    Prediabetes    A1C looks okay, no  changes       Obesity   History of CVA (cerebrovascular accident)    He still has some residual numbness in his face and arm. With regards to strength and UC is doing quite well. He is to hold off on occupational therapy. He is able to do his regular activities without difficulties. His carotid ultrasound did show some mild atherosclerosis thank you benefit from being on a statin drug but this was not started by his neurologist therefore been deferred see if there is any other concerns they may have before starting the statin drug. He has appointment on May 22.      Essential hypertension, benign    Well controlled      CLL (chronic lymphocytic leukemia) ([place])    Chronic elevation of white blood cells secondary to his CLL. He has appointment with oncology in 3 weeks there for I'm not rechecking labs today. He feels well      Relevant Medications   HYDROcodone-acetaminophen (NORCO) 10-325 MG tablet   CKD (chronic kidney disease), stage III - Primary    Recheck creatine, urinating well Blood pressure controlled          Note: This dictation was prepared with Dragon dictation along with smaller phrase technology. Any transcriptional errors that result from this process are unintentional.

## 2017-03-18 NOTE — Assessment & Plan Note (Signed)
A1C looks okay, no changes

## 2017-03-18 NOTE — Assessment & Plan Note (Signed)
Well controlled 

## 2017-03-18 NOTE — Assessment & Plan Note (Signed)
Chronic elevation of white blood cells secondary to his CLL. He has appointment with oncology in 3 weeks there for I'm not rechecking labs today. He feels well

## 2017-03-18 NOTE — Patient Instructions (Signed)
We will hold on starting statin until seen by neurology F/U 4 months

## 2017-03-18 NOTE — Assessment & Plan Note (Signed)
He still has some residual numbness in his face and arm. With regards to strength and UC is doing quite well. He is to hold off on occupational therapy. He is able to do his regular activities without difficulties. His carotid ultrasound did show some mild atherosclerosis thank you benefit from being on a statin drug but this was not started by his neurologist therefore been deferred see if there is any other concerns they may have before starting the statin drug. He has appointment on May 22.

## 2017-03-18 NOTE — Assessment & Plan Note (Signed)
Recheck creatine, urinating well Blood pressure controlled

## 2017-03-22 ENCOUNTER — Other Ambulatory Visit: Payer: Self-pay | Admitting: Family Medicine

## 2017-03-22 NOTE — Telephone Encounter (Signed)
Ok to refill 

## 2017-03-22 NOTE — Telephone Encounter (Signed)
Medication called to pharmacy. 

## 2017-03-22 NOTE — Telephone Encounter (Signed)
okay

## 2017-04-04 ENCOUNTER — Encounter: Payer: Self-pay | Admitting: Family Medicine

## 2017-04-05 MED ORDER — CLOPIDOGREL BISULFATE 75 MG PO TABS: 75.0000 mg | ORAL_TABLET | Freq: Every day | ORAL | 1 refills | Status: DC

## 2017-04-07 ENCOUNTER — Encounter (HOSPITAL_COMMUNITY): Payer: Medicare Other

## 2017-04-07 ENCOUNTER — Encounter (HOSPITAL_COMMUNITY): Payer: Self-pay | Admitting: Adult Health

## 2017-04-07 ENCOUNTER — Encounter (HOSPITAL_COMMUNITY): Payer: Medicare Other | Attending: Adult Health | Admitting: Adult Health

## 2017-04-07 VITALS — BP 155/58 | HR 56 | Temp 98.2°F | Resp 18 | Ht 66.0 in | Wt 249.9 lb

## 2017-04-07 DIAGNOSIS — C9110 Chronic lymphocytic leukemia of B-cell type not having achieved remission: Secondary | ICD-10-CM | POA: Diagnosis not present

## 2017-04-07 DIAGNOSIS — Z85528 Personal history of other malignant neoplasm of kidney: Secondary | ICD-10-CM | POA: Insufficient documentation

## 2017-04-07 DIAGNOSIS — D801 Nonfamilial hypogammaglobulinemia: Secondary | ICD-10-CM | POA: Diagnosis not present

## 2017-04-07 DIAGNOSIS — N183 Chronic kidney disease, stage 3 unspecified: Secondary | ICD-10-CM

## 2017-04-07 LAB — CBC WITH DIFFERENTIAL/PLATELET
BASOS ABS: 0.3 10*3/uL — AB (ref 0.0–0.1)
BASOS PCT: 1 %
EOS PCT: 4 %
Eosinophils Absolute: 1.4 10*3/uL — ABNORMAL HIGH (ref 0.0–0.7)
HEMATOCRIT: 48.6 % (ref 39.0–52.0)
Hemoglobin: 16.1 g/dL (ref 13.0–17.0)
LYMPHS PCT: 76 %
Lymphs Abs: 25.9 10*3/uL — ABNORMAL HIGH (ref 0.7–4.0)
MCH: 28.8 pg (ref 26.0–34.0)
MCHC: 33.1 g/dL (ref 30.0–36.0)
MCV: 86.8 fL (ref 78.0–100.0)
MONOS PCT: 3 %
Monocytes Absolute: 1 10*3/uL (ref 0.1–1.0)
NEUTROS ABS: 5.4 10*3/uL (ref 1.7–7.7)
Neutrophils Relative %: 16 %
Platelets: 126 10*3/uL — ABNORMAL LOW (ref 150–400)
RBC: 5.6 MIL/uL (ref 4.22–5.81)
RDW: 15.2 % (ref 11.5–15.5)
WBC: 34 10*3/uL — ABNORMAL HIGH (ref 4.0–10.5)

## 2017-04-07 LAB — COMPREHENSIVE METABOLIC PANEL WITH GFR
ALT: 23 U/L (ref 17–63)
AST: 23 U/L (ref 15–41)
Albumin: 4.3 g/dL (ref 3.5–5.0)
Alkaline Phosphatase: 38 U/L (ref 38–126)
Anion gap: 7 (ref 5–15)
BUN: 21 mg/dL — ABNORMAL HIGH (ref 6–20)
CO2: 23 mmol/L (ref 22–32)
Calcium: 9.1 mg/dL (ref 8.9–10.3)
Chloride: 106 mmol/L (ref 101–111)
Creatinine, Ser: 1.51 mg/dL — ABNORMAL HIGH (ref 0.61–1.24)
GFR calc Af Amer: 54 mL/min — ABNORMAL LOW (ref >60)
GFR calc non Af Amer: 46 mL/min — ABNORMAL LOW (ref >60)
Glucose, Bld: 132 mg/dL — ABNORMAL HIGH (ref 65–99)
Potassium: 4.5 mmol/L (ref 3.5–5.1)
Sodium: 136 mmol/L (ref 135–145)
Total Bilirubin: 0.6 mg/dL (ref 0.3–1.2)
Total Protein: 6.4 g/dL — ABNORMAL LOW (ref 6.5–8.1)

## 2017-04-07 LAB — LACTATE DEHYDROGENASE: LDH: 118 U/L (ref 98–192)

## 2017-04-07 NOTE — Patient Instructions (Addendum)
[place] at [facility] Discharge Instructions  RECOMMENDATIONS MADE BY THE CONSULTANT AND ANY TEST RESULTS WILL BE SENT TO YOUR REFERRING PHYSICIAN.  You were seen today by Mike Craze NP Lab work every 6 weeks Follow up in 3 months with lab work   Thank you for choosing [place] at [facility] to provide your oncology and hematology care.  To afford each patient quality time with our provider, please arrive at least 15 minutes before your scheduled appointment time.    If you have a lab appointment with the [facility] please come in thru the  Main Entrance and check in at the main information desk  You need to re-schedule your appointment should you arrive 10 or more minutes late.  We strive to give you quality time with our providers, and arriving late affects you and other patients whose appointments are after yours.  Also, if you no show three or more times for appointments you may be dismissed from the clinic at the providers discretion.     Again, thank you for choosing [facility].  Our hope is that these requests will decrease the amount of time that you wait before being seen by our physicians.       _____________________________________________________________  Should you have questions after your visit to [facility], please contact our office at [phone] between the hours of 8:30 a.m. and 4:30 p.m.  Voicemails left after 4:30 p.m. will not be returned until the following business day.  For prescription refill requests, have your pharmacy contact our office.       Resources For Cancer Patients and their Caregivers ? American Cancer Society: Can assist with transportation, wigs, general needs, runs Look Good Feel Better.        305-033-8352 ? Cancer Care: Provides financial assistance, online support groups, medication/co-pay assistance.  1-800-813-HOPE (351) 262-2604) ? [place] Assists [place] cancer patients and their families through emotional , educational and financial support.  (435) 628-2724 ? [place] DSS Where to apply for food stamps, Medicaid and utility assistance. (207)644-4724 ? [facility]: Transportation to medical appointments. (425)315-4940 ? Social Security Administration: May apply for disability if have a Stage IV cancer. (818)115-3365 (854)628-9307 ? [employer], Disability and Transit Services: Assists with nutrition, care and transit needs. [place] Support Programs: @10RELATIVEDAYS @ > Cancer Support Group  2nd Tuesday of the month 1pm-2pm, Journey Room  > Creative Journey  3rd Tuesday of the month 1130am-1pm, Journey Room  > Look Good Feel Better  1st Wednesday of the month 10am-12 noon, Journey Room (Call [place] to register [phone])

## 2017-04-07 NOTE — Progress Notes (Signed)
[address] [place], [place] [address]   CLINIC:  Medical Oncology/Hematology  PCP:  Vic Blackbird, [place] [address] [phone]   REASON FOR VISIT:  Follow-up for CLL & hypogammaglobulinemia   CURRENT THERAPY: Observation    HISTORY OF PRESENT ILLNESS:  (From Dr. Donald Pore last note on 12/08/16)     INTERVAL HISTORY:  [name] Ball 66 y.o. male returns for follow-up for CLL and hypogammaglobulinemia.   Since his last visit at the cancer center, he was hospitalized from 03/08/17-03/10/17 for right-sided weakness; was found to have acute left thalamic infarct and was started on Plavix. Aspirin therapy is contraindicated given his previous nephrectomy.    He is recovering well from stroke. He continues to have right sided headache, right arm & right facial numbness, and occasional dizziness as well.  He reports periodic nausea; denies vomiting. His energy levels are 25%.  The fatigue is worse only since the stroke. Prior to 1 month ago, they traveled to [place] for spring baseball and had a great time.  His wife is concerned about continued weight loss; he has lost about 6 lbs since 12/2016. Chart reviewed; total weight loss since 11/2016 ~17 lbs. Appetite is 100%; he tells me that he certainly has not been trying to lose weight. He feels like he is eating well and has been a bit less active since his stroke.   Of note, the patient's wife speaks/translates with sign language for the patient during much of the visit given the patient's hearing impairment.     REVIEW OF SYSTEMS:  Review of Systems  Constitutional: Positive for fatigue. Negative for chills and fever.  HENT:  Negative.  Negative for lump/mass and nosebleeds.   Eyes: Negative.   Respiratory: Negative.  Negative for cough and shortness of breath.   Cardiovascular: Negative.  Negative for chest pain and leg swelling.  Gastrointestinal: Negative.  Negative for  abdominal pain, blood in stool, constipation, diarrhea, nausea and vomiting.  Endocrine: Negative.   Genitourinary: Negative.  Negative for dysuria and hematuria.   Musculoskeletal: Positive for arthralgias ("arthritis from my old injuries" ).       Walks with cane   Skin: Negative.  Negative for rash.  Neurological: Positive for dizziness, headaches and numbness.  Hematological: Negative.  Negative for adenopathy. Does not bruise/bleed easily.  Psychiatric/Behavioral: Negative.  Negative for depression and sleep disturbance. The patient is not nervous/anxious.      PAST MEDICAL/SURGICAL HISTORY:  Past Medical History:  Diagnosis Date  . Allergy   . Arthritis   . Asthma   . Cancer ([place])    kidney  . CLL (chronic lymphocytic leukemia) ([place]) 2016  . COPD (chronic obstructive pulmonary disease) ([place])   . Diverticulitis   . Diverticulosis    colonoscopy 3/15  . GERD (gastroesophageal reflux disease)   . Hypertension   . Low testosterone   . Migraine   . Shingles 03/31/2016  . Solar purpura ([place])   . Thyroid disease    hypothryoid  . Ulcer   . Ventral hernia FEB 2015 CT    Large infra umbilical ventral hernia containing loops of small   Past Surgical History:  Procedure Laterality Date  . abdominal mesh inserted  june 2006, may 2008,dec 2010  . CHOLECYSTECTOMY  june 2007  . COLON SURGERY  sept 2007  . COLONOSCOPY    . COLONOSCOPY N/A 02/20/2014   Procedure: COLONOSCOPY;  Surgeon: Danie Binder,  MD;  Location: [facility]ITE;  Service: Endoscopy;  Laterality: N/A;  . HERNIA REPAIR  05/2005 5/20108, 11/2009   with mesh  . KIDNEY CYST REMOVAL  jan 2005   renal cell carcinoma  . knee minescus repair Left feb 2007  . NEPHRECTOMY       SOCIAL HISTORY:  He says he's been married 57 years. He has 4 children and 6 grandchildren.  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 He also cooks. He also worked in Public relations account executive.  He used to smoke. He quit about 17 years ago. He says he has a shot of whisky one or two nights a week, so he doesn't have problems with alcohol. He may, however, have a problem with soda -- Solicitor.  In terms of his hobbies, he jokes that he "likes to do nothing." He cooks. He says he's the "second best cook on this side of the [place], but I'm not bragging." [place] food is his specialty, he says. He says he makes an excellent leg of lamb, and a great Moussaka. His wife makes a good Baklava. He also has a good [place] chicken recipe.  Why [place] food? When he was younger, he cooked and worked in [place], [place] with a family that was all [place].  They had a [place] festival coming up. So grandma came in and they cooked for the [place] -- maybe 2500 people.  She showed him each recipe and she was really tough. He says it really stuck with him ever since. He says when he gets his recipes, he gets them from families; family recipes. He says he's got about 3,000 of them. He says he doesn't give his recipes to anybody, but he'll make them. He says "I make the best pizza in the [place]." He says he makes a good soup, too. It's clear that he loves to cook very much. Social History   Social History  . Marital status: Married    Spouse name: N/A  . Number of children: N/A  . Years of education: N/A   Occupational History  . Not on file.   Social History Main Topics  . Smoking status: Former Smoker    Quit date: 12/06/1996  . Smokeless tobacco: Never Used  . Alcohol use Yes     Comment: a few shots a few times a week  . Drug use: No  . Sexual activity: Not on file   Other Topics Concern  . 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    FAMILY HISTORY:  Family History  Problem Relation Age of Onset  . Cancer Mother     lung  . Colon  cancer Neg Hx     CURRENT MEDICATIONS:  Outpatient Encounter Prescriptions as of 04/07/2017  Medication Sig  . acetaminophen (TYLENOL) 325 MG tablet Take 650 mg by mouth every 6 (six) hours as needed.  Marland Kitchen albuterol (PROVENTIL) (2.5 MG/3ML) 0.083% nebulizer solution TAKE 3MLS(1 VIAL) VIA NEBULIZER EVERY 6 HOURS AS NEEDED FOR WHEEZING OR SHORTNESS OF BREATH  . allopurinol (ZYLOPRIM) 100 MG tablet TAKE 1 TABLET(100 MG) BY MOUTH AT BEDTIME  . amLODipine (NORVASC) 5 MG tablet TAKE 1 TABLET(5 MG) BY MOUTH DAILY  . ATROVENT HFA 17 MCG/ACT inhaler INHALE 2  PUFFS BY MOUTH INTO THE LUNGS EVERY 6 HOURS  . calcium carbonate (TUMS - DOSED IN MG ELEMENTAL CALCIUM) 500 MG chewable tablet Chew 2-3 tablets by mouth once as needed for indigestion or heartburn. Reported on 11/21/2015  . citalopram (CELEXA) 10 MG tablet TAKE 1 TABLET(10 MG) BY MOUTH DAILY  . clopidogrel (PLAVIX) 75 MG tablet Take 1 tablet (75 mg total) by mouth daily.  . diphenhydrAMINE (BENADRYL) 25 MG tablet Take 25 mg by mouth every evening.   . Glucosamine-Chondroit-Vit C-Mn (GLUCOSAMINE 1500 COMPLEX) CAPS Take 1,200 capsules by mouth 2 (two) times daily.  Marland Kitchen HYDROcodone-acetaminophen (NORCO) 10-325 MG tablet Take 1-2 tablets by mouth every 8 (eight) hours as needed.  . levalbuterol (XOPENEX HFA) 45 MCG/ACT inhaler Inhale 1-2 puffs into the lungs every 4 (four) hours as needed for wheezing.  Marland Kitchen levothyroxine (SYNTHROID, LEVOTHROID) 50 MCG tablet TAKE 1 TABLET(50 MCG) BY MOUTH DAILY BEFORE BREAKFAST  . loratadine (CLARITIN) 10 MG tablet Take 10 mg by mouth every morning.   . metoprolol (LOPRESSOR) 50 MG tablet TAKE 1 TABLET(50 MG) BY MOUTH TWICE DAILY  . mometasone (ASMANEX 60 METERED DOSES) 220 MCG/INH inhaler Inhale 2 puffs into the lungs 2 (two) times daily.  . Multiple Vitamin (MULTIVITAMIN WITH MINERALS) TABS tablet Take 1 tablet by mouth daily.  . Omega-3 Fatty Acids (FISH OIL) 1000 MG CPDR Take by mouth 2 (two) times daily.  . ranitidine  (ZANTAC) 75 MG tablet Take 75 mg by mouth 2 (two) times daily.  Marland Kitchen testosterone cypionate (DEPOTESTOSTERONE CYPIONATE) 200 MG/ML injection INJECT 1.5 MLS INTO THE MUSCLE EVERY 14 DAYS  . vitamin B-12 (CYANOCOBALAMIN) 1000 MCG tablet Take 1,000 mcg by mouth every morning.   . vitamin C (ASCORBIC ACID) 500 MG tablet Take 500 mg by mouth 2 (two) times daily.  . vitamin E 400 UNIT capsule Take 400 Units by mouth every morning.    No facility-administered encounter medications on file as of 04/07/2017.     ALLERGIES:  Allergies  Allergen Reactions  . Contrast Media [Iodinated Diagnostic Agents]     Single functioning kidney  . Other Hives    Neoprene fabric Strawberry   . Ether Rash    Took a long time to wake up Doesn't wake up from it     PHYSICAL EXAM:  ECOG Performance status: 1-2 - Symptomatic; requires occasional assistance  Vitals:   04/07/17 1340  BP: (!) 155/58  Pulse: (!) 56  Resp: 18  Temp: 98.2 F (36.8 C)   Filed Weights   04/07/17 1340  Weight: 249 lb 14.4 oz (113.4 kg)    Physical Exam  Constitutional: He is oriented to person, place, and time and well-developed, well-nourished, and in no distress.  Exam performed with pt seated in chair   HENT:  Head: Normocephalic.  Mouth/Throat: Oropharynx is clear and moist. No oropharyngeal exudate.  Hearing deficit; uses sign language periodically to communicate. Is also able to speak clearly.   Eyes: Conjunctivae are normal. Pupils are equal, round, and reactive to light. No scleral icterus.  Neck: Normal range of motion.  Cardiovascular: Normal rate, regular rhythm and normal heart sounds.   Pulmonary/Chest: Effort normal. He has no wheezes.  Diminished breath sounds bilat bases   Abdominal: Soft. Bowel sounds are normal. There is no tenderness. There is no rebound and no guarding.  Musculoskeletal: Normal range of motion. He exhibits no edema.  Ambulates with cane   Lymphadenopathy:    He has no cervical  adenopathy.  Neurological:  He is alert and oriented to person, place, and time.  Numbness noted to trigeminal area of right side of face  Skin: Skin is warm and dry. No rash noted.  Psychiatric: Mood, memory, affect and judgment normal.  Nursing note and vitals reviewed.    LABORATORY DATA:  I have reviewed the labs as listed.  CBC    Component Value Date/Time   WBC 34.0 (H) 04/07/2017 1248   RBC 5.60 04/07/2017 1248   HGB 16.1 04/07/2017 1248   HCT 48.6 04/07/2017 1248   PLT 126 (L) 04/07/2017 1248   MCV 86.8 04/07/2017 1248   MCH 28.8 04/07/2017 1248   MCHC 33.1 04/07/2017 1248   RDW 15.2 04/07/2017 1248   LYMPHSABS 25.9 (H) 04/07/2017 1248   MONOABS 1.0 04/07/2017 1248   EOSABS 1.4 (H) 04/07/2017 1248   BASOSABS 0.3 (H) 04/07/2017 1248   CMP Latest Ref Rng & Units 04/07/2017 03/08/2017 03/08/2017  Glucose 65 - 99 mg/dL 132(H) 133(H) 133(H)  BUN 6 - 20 mg/dL 21(H) 16 15  Creatinine 0.61 - 1.24 mg/dL 1.51(H) 1.60(H) 1.52(H)  Sodium 135 - 145 mmol/L 136 139 136  Potassium 3.5 - 5.1 mmol/L 4.5 4.3 4.2  Chloride 101 - 111 mmol/L 106 104 105  CO2 22 - 32 mmol/L 23 - 24  Calcium 8.9 - 10.3 mg/dL 9.1 - 8.9  Total Protein 6.5 - 8.1 g/dL 6.4(L) - 6.5  Total Bilirubin 0.3 - 1.2 mg/dL 0.6 - 0.5  Alkaline Phos 38 - 126 U/L 38 - 41  AST 15 - 41 U/L 23 - 20  ALT 17 - 63 U/L 23 - 22   Results for KAULIN, CHAVES (MRN [id number])   Ref. Range 04/07/2017 12:48  LDH Latest Ref Range: 98 - 192 U/L 118   PENDING LABS:    DIAGNOSTIC IMAGING:  MRI Brain: 03/09/17     PATHOLOGY:  Path smear review: 11/10/16      ASSESSMENT & PLAN:   Chronic lymphocytic leukemia/SLL:  -WBCs are improved from previous; they are 34.0 today.  -Platelet counts are continuing to trend down; platelets 126,000. No active bleeding.  -He has also had about 17 lb weight loss in the past 5 months or so, which is concerning.  He also has reported fatigue, which is worse in the past month.  However, he  recently had TIA and is continuing to recover.   -We reviewed the NCCN Guidelines for starting treatment for CLL. While he does have complaints of fatigue and weight loss, it is difficult to assess the severity of these symptoms given recent stroke.  His thrombocytopenia is only mildly progressed and could be secondary to initiation of Plavix s/p recent stroke vs progression of CLL.  Hemoglobin remains preserved and is normal today. No palpable adenopathy on exam. No reported fevers or night sweats.  -Discussed that our recommendation would be to increase the frequency that we check labs to ensure stability of WBCs and platelet counts. Then, bring him back for follow-up/evaluation in 3 months. He and his wife agreed.  -Will collect CBC with diff every 6 weeks; standing orders placed today.  -Return to cancer center in 3 months for follow-up visit and labs; orders placed.    NCCN guidelines for indication for treatment of CLL are:  A. Eligible for clinical trial  B. Significant disease-related symptoms   1. Fatigue (severe)   2. Night sweats   3. Weight loss   4. Fever without infection  C. Threatened end-organ function  D. Progressive bulky disease (spleen >6cm below costal margin, lymph nodes >10 cm)  E. Progressive anemia  F. Progressive thrombocytopenia.    Recent TIA:  -Currently on Plavix. He has not seen neurologist yet; they have an appointment scheduled with Dr. Leta Baptist on 04/26/17. We certainly want to get his recommendations for continued anticoagulation before we consider starting any CLL-focused therapy.   -Recommended he continue to improve physically since his recent stroke by continuing to improve his strength and stamina.   Hypogammaglobulinemia:  -Historically treated with several doses of IVIG.  IVIG infusions were stopped d/t cost. No recent infections. -Will add on IgG, IgM, and IgA for labs in 3 months.   Weight loss:  -Could be secondary to CLL vs recent acute  stroke and continued recovery.  Recommended he increase his protein and caloric intake in the coming weeks/months.   -Will consider referral to nutrition, if weight loss continues.   History of kidney cancer:  -History of kidney cancer s/p nephrectomy.  -BUN/CRE chronically elevated; 21/1.51 today. EGFR 46 indicating stage 3 CKD.  -Patient should not receive contrast for scans.       Dispo:  -Labs only every 6 weeks (CBC with diff standing orders placed). -Return to cancer center in 3 months with labs (CBC with diff, CMET, LDH, and IgG/IgA/IgM orders placed).     All questions were answered to patient's stated satisfaction. Encouraged patient to call with any new concerns or questions before his next visit to the cancer center and we can certain see him sooner, if needed.    Plan of care discussed with Dr. Talbert Cage, who agrees with the above aforementioned.     Orders placed this encounter:  Orders Placed This Encounter  Procedures  . CBC with Differential/Platelet  . Comprehensive metabolic panel  . Lactate dehydrogenase  . IgG, IgA, IgM  . CBC with Differential/Platelet      [name] Craze, NP [place] [phone]

## 2017-04-09 ENCOUNTER — Encounter (HOSPITAL_COMMUNITY): Payer: Self-pay | Admitting: Adult Health

## 2017-04-19 ENCOUNTER — Other Ambulatory Visit: Payer: Self-pay | Admitting: Family Medicine

## 2017-04-22 ENCOUNTER — Ambulatory Visit: Payer: Medicare Other | Admitting: Diagnostic Neuroimaging

## 2017-04-26 ENCOUNTER — Ambulatory Visit (INDEPENDENT_AMBULATORY_CARE_PROVIDER_SITE_OTHER): Payer: Medicare Other | Admitting: Diagnostic Neuroimaging

## 2017-04-26 ENCOUNTER — Encounter: Payer: Self-pay | Admitting: Diagnostic Neuroimaging

## 2017-04-26 VITALS — BP 171/85 | HR 63 | Ht 66.0 in | Wt 249.6 lb

## 2017-04-26 DIAGNOSIS — J449 Chronic obstructive pulmonary disease, unspecified: Secondary | ICD-10-CM

## 2017-04-26 DIAGNOSIS — I6930 Unspecified sequelae of cerebral infarction: Secondary | ICD-10-CM | POA: Diagnosis not present

## 2017-04-26 DIAGNOSIS — C9190 Lymphoid leukemia, unspecified not having achieved remission: Secondary | ICD-10-CM

## 2017-04-26 DIAGNOSIS — G4733 Obstructive sleep apnea (adult) (pediatric): Secondary | ICD-10-CM

## 2017-04-26 DIAGNOSIS — C9110 Chronic lymphocytic leukemia of B-cell type not having achieved remission: Secondary | ICD-10-CM

## 2017-04-26 DIAGNOSIS — I10 Essential (primary) hypertension: Secondary | ICD-10-CM | POA: Diagnosis not present

## 2017-04-26 NOTE — Patient Instructions (Signed)
Thank you for coming to see [place] at [facility]. I hope we have been able to provide you high quality care today.  You may receive a patient satisfaction survey over the next few weeks. We would appreciate your feedback and comments so that we may continue to improve ourselves and the health of our patients.   - continue plavix  - improve BP control  - continue BiPAP   ~~~~~~~~~~~~~~~~~~~~~~~~~~~~~~~~~~~~~~~~~~~~~~~~~~~~~~~~~~~~~~~~~  DR. Amado Andal'S GUIDE TO HAPPY AND HEALTHY LIVING These are some of my general health and wellness recommendations. Some of them may apply to you better than others. Please use common sense as you try these suggestions and feel free to ask me any questions.   ACTIVITY/FITNESS Mental, social, emotional and physical stimulation are very important for brain and body health. Try learning a new activity (arts, music, language, sports, games).  Keep moving your body to the best of your abilities. You can do this at home, inside or outside, the park, community center, gym or anywhere you like. Consider a physical therapist or personal trainer to get started. Consider the app Sworkit. Fitness trackers such as smart-watches, smart-phones or Fitbits can help as well.   NUTRITION Eat more plants: colorful vegetables, nuts, seeds and berries.  Eat less sugar, salt, preservatives and processed foods.  Avoid toxins such as cigarettes and alcohol.  Drink water when you are thirsty. Warm water with a slice of lemon is an excellent morning drink to start the day.  Consider these websites for more information The Nutrition Source (https://www.henry-hernandez.biz/) Precision Nutrition (WindowBlog.ch)   RELAXATION Consider practicing mindfulness meditation or other relaxation techniques such as deep breathing, prayer, yoga, tai chi, massage. See website mindful.org or the apps Headspace or Calm to help get  started.   SLEEP Try to get at least 7-8+ hours sleep per day. Regular exercise and reduced caffeine will help you sleep better. Practice good sleep hygeine techniques. See website sleep.org for more information.   PLANNING Prepare estate planning, living will, healthcare POA documents. Sometimes this is best planned with the help of an attorney. Theconversationproject.org and agingwithdignity.org are excellent resources.

## 2017-04-26 NOTE — Progress Notes (Signed)
[facility]  PATIENT: [name] Ball DOB: 09-13-1951  REFERRING CLINICIAN: Ronnie Derby HISTORY FROM: patient and wife  REASON FOR VISIT: new consult    HISTORICAL  CHIEF COMPLAINT:  Chief Complaint  Patient presents with  . Transient Ischemic Attack    rm 6, New Pt,  [facility] hospital FU, wife- Zigmund Daniel    HISTORY OF PRESENT ILLNESS:   66 year old male here for evaluation of left thalamic stroke. Patient has history of hypertension, CLL, COPD, obstructive sleep apnea, presented to the hospital over the right face and right arm numbness and weakness. Symptoms started around 1 PM. Patient presented to the emergency room around 11 PM. Patient was admitted and found to have an acute left thalamic ischemic infarction, likely due to small vessel thrombosis. Patient had stroke workup completed. He was discharged on Plavix.  Since that time symptoms have significantly improved.    REVIEW OF SYSTEMS: Full 14 system review of systems performed and negative with exception of: Weight loss fatigue hearing loss blurred vision shortness of breath snoring joint pain allergies depression anxiety headache numbness dizziness.   ALLERGIES: Allergies  Allergen Reactions  . Contrast Media [Iodinated Diagnostic Agents]     Single functioning kidney  . Other Hives    Neoprene fabric Strawberry   . Ether Rash    Took a long time to wake up Doesn't wake up from it    HOME MEDICATIONS: Outpatient Medications Prior to Visit  Medication Sig Dispense Refill  . acetaminophen (TYLENOL) 325 MG tablet Take 650 mg by mouth every 6 (six) hours as needed.    Marland Kitchen albuterol (PROVENTIL) (2.5 MG/3ML) 0.083% nebulizer solution TAKE 3MLS(1 VIAL) VIA NEBULIZER EVERY 6 HOURS AS NEEDED FOR WHEEZING OR SHORTNESS OF BREATH 150 mL 0  . allopurinol (ZYLOPRIM) 100 MG tablet TAKE 1 TABLET(100 MG) BY MOUTH AT BEDTIME 90 tablet 0  . amLODipine (NORVASC) 5 MG tablet TAKE 1 TABLET(5 MG) BY MOUTH DAILY 90  tablet 3  . ATROVENT HFA 17 MCG/ACT inhaler INHALE 2 PUFFS BY MOUTH INTO THE LUNGS EVERY 6 HOURS 12.9 g 0  . calcium carbonate (TUMS - DOSED IN MG ELEMENTAL CALCIUM) 500 MG chewable tablet Chew 2-3 tablets by mouth once as needed for indigestion or heartburn. Reported on 11/21/2015    . citalopram (CELEXA) 10 MG tablet TAKE 1 TABLET(10 MG) BY MOUTH DAILY 90 tablet 0  . clopidogrel (PLAVIX) 75 MG tablet Take 1 tablet (75 mg total) by mouth daily. 90 tablet 1  . diphenhydrAMINE (BENADRYL) 25 MG tablet Take 25 mg by mouth every evening.     . Glucosamine-Chondroit-Vit C-Mn (GLUCOSAMINE 1500 COMPLEX) CAPS Take 1,200 capsules by mouth 2 (two) times daily.    Marland Kitchen HYDROcodone-acetaminophen (NORCO) 10-325 MG tablet Take 1-2 tablets by mouth every 8 (eight) hours as needed. 120 tablet 0  . levalbuterol (XOPENEX HFA) 45 MCG/ACT inhaler Inhale 1-2 puffs into the lungs every 4 (four) hours as needed for wheezing. 1 Inhaler 6  . levothyroxine (SYNTHROID, LEVOTHROID) 50 MCG tablet TAKE 1 TABLET(50 MCG) BY MOUTH DAILY BEFORE BREAKFAST 90 tablet 0  . loratadine (CLARITIN) 10 MG tablet Take 10 mg by mouth every morning.     . metoprolol (LOPRESSOR) 50 MG tablet TAKE 1 TABLET(50 MG) BY MOUTH TWICE DAILY 180 tablet 0  . mometasone (ASMANEX 60 METERED DOSES) 220 MCG/INH inhaler Inhale 2 puffs into the lungs 2 (two) times daily. 1 Inhaler 11  . Multiple Vitamin (MULTIVITAMIN WITH MINERALS) TABS tablet Take 1  tablet by mouth daily.    . Omega-3 Fatty Acids (FISH OIL) 1000 MG CPDR Take by mouth 2 (two) times daily.    . ranitidine (ZANTAC) 75 MG tablet Take 75 mg by mouth 2 (two) times daily.    Marland Kitchen testosterone cypionate (DEPOTESTOSTERONE CYPIONATE) 200 MG/ML injection INJECT 1.5 MLS INTO THE MUSCLE EVERY 14 DAYS 10 mL 0  . vitamin B-12 (CYANOCOBALAMIN) 1000 MCG tablet Take 1,000 mcg by mouth every morning.     . vitamin C (ASCORBIC ACID) 500 MG tablet Take 500 mg by mouth 2 (two) times daily.    . vitamin E 400 UNIT  capsule Take 400 Units by mouth every morning.      No facility-administered medications prior to visit.     PAST MEDICAL HISTORY: Past Medical History:  Diagnosis Date  . Allergy   . Arthritis   . Asthma   . Cancer ([place])    kidney  . CLL (chronic lymphocytic leukemia) ([place]) 2016  . COPD (chronic obstructive pulmonary disease) ([place])   . Diverticulitis   . Diverticulosis    colonoscopy 3/15  . GERD (gastroesophageal reflux disease)   . Hypertension   . Low testosterone   . Migraine   . Shingles 03/31/2016  . Solar purpura ([place])   . Thyroid disease    hypothryoid  . Ulcer   . Ventral hernia FEB 2015 CT    Large infra umbilical ventral hernia containing loops of small    PAST SURGICAL HISTORY: Past Surgical History:  Procedure Laterality Date  . abdominal mesh inserted  june 2006, may 2008,dec 2010  . CHOLECYSTECTOMY  june 2007  . COLON SURGERY  sept 2007  . COLONOSCOPY    . COLONOSCOPY N/A 02/20/2014   Procedure: COLONOSCOPY;  Surgeon: Danie Binder, MD;  Location: [facility];  Service: Endoscopy;  Laterality: N/A;  . HERNIA REPAIR  05/2005 5/20108, 11/2009   with mesh  . KIDNEY CYST REMOVAL  jan 2005   renal cell carcinoma  . knee minescus repair Left feb 2007  . NEPHRECTOMY Left 12/2003    FAMILY HISTORY: Family History  Problem Relation Age of Onset  . Cancer Mother        lung, met to brain  . Diabetes Mellitus II Sister   . Colon cancer Neg Hx     SOCIAL HISTORY:  Social History   Social History  . Marital status: Married    Spouse name: Zigmund Daniel  . Number of children: 4  . Years of education: 12   Occupational History  .      retired   Social History Main Topics  . Smoking status: Former Smoker    Quit date: 12/06/1996  . Smokeless tobacco: Never Used  . Alcohol use Yes     Comment: a few shots a few times a month  . Drug use: No  . Sexual activity: Not on file   Other Topics Concern  . Not on file   Social History Narrative   PT IS A  JEHOVAH'S WITNESS & DOES NOT WANT BLOOD PRODUCTS.   Lives at home with wife, daughter, family   Caffeine- sodas, 2 daily     PHYSICAL EXAM  GENERAL EXAM/CONSTITUTIONAL: Vitals:  Vitals:   04/26/17 1058  BP: (!) 171/85  Pulse: 63  Weight: 249 lb 9.6 oz (113.2 kg)  Height: _0  (1.676 m)     Body mass index is 40.29 kg/m.  Visual Acuity Screening   Right eye Left  eye Both eyes  Without correction:     With correction: 20/20 20/40   Comments: bifocals    Patient is in no distress; well developed, nourished and groomed; neck is supple  CARDIOVASCULAR:  Examination of carotid arteries is normal; no carotid bruits  Regular rate and rhythm, no murmurs  Examination of peripheral vascular system by observation and palpation is normal  EYES:  Ophthalmoscopic exam of optic discs and posterior segments is normal; no papilledema or hemorrhages  MUSCULOSKELETAL:  Gait, strength, tone, movements noted in Neurologic exam below  NEUROLOGIC: MENTAL STATUS:  No flowsheet data found.  awake, alert, oriented to person, place and time  recent and remote memory intact  normal attention and concentration  language fluent, comprehension intact, naming intact,   fund of knowledge appropriate  CRANIAL NERVE:   2nd - no papilledema on fundoscopic exam  2nd, 3rd, 4th, 6th - pupils equal and reactive to light, visual fields full to confrontation, extraocular muscles intact, no nystagmus  5th - facial sensation --> DECR IN RIGHT FACE  7th - facial strength symmetric  8th - hearing --> SEVERE REDUCTION  9th - palate elevates symmetrically, uvula midline  11th - shoulder shrug symmetric  12th - tongue protrusion midline  MOTOR:   normal bulk and tone, full strength in the BUE, BLE  SENSORY:   normal and symmetric to light touch, temperature, vibration  EXCEPT DECR IN RIGHT FACE AND RIGHT HAND  COORDINATION:   finger-nose-finger, fine finger movements  normal  REFLEXES:   deep tendon reflexes TRACE and symmetric  GAIT/STATION:   narrow based gait    DIAGNOSTIC DATA (LABS, IMAGING, TESTING) - I reviewed patient records, labs, notes, testing and imaging myself where available.  Lab Results  Component Value Date   WBC 34.0 (H) 04/07/2017   HGB 16.1 04/07/2017   HCT 48.6 04/07/2017   MCV 86.8 04/07/2017   PLT 126 (L) 04/07/2017      Component Value Date/Time   NA 136 04/07/2017 1248   NA 138 01/31/2013   K 4.5 04/07/2017 1248   CL 106 04/07/2017 1248   CO2 23 04/07/2017 1248   GLUCOSE 132 (H) 04/07/2017 1248   BUN 21 (H) 04/07/2017 1248   BUN 19 01/31/2013   CREATININE 1.51 (H) 04/07/2017 1248   CREATININE 1.54 (H) 11/10/2016 1057   CALCIUM 9.1 04/07/2017 1248   PROT 6.4 (L) 04/07/2017 1248   ALBUMIN 4.3 04/07/2017 1248   AST 23 04/07/2017 1248   ALT 23 04/07/2017 1248   ALKPHOS 38 04/07/2017 1248   BILITOT 0.6 04/07/2017 1248   GFRNONAA 46 (L) 04/07/2017 1248   GFRNONAA 46 (L) 04/03/2015 1550   GFRAA 54 (L) 04/07/2017 1248   GFRAA 53 (L) 04/03/2015 1550   Lab Results  Component Value Date   CHOL 130 03/10/2017   HDL 21 (L) 03/10/2017   LDLCALC 70 03/10/2017   TRIG 196 (H) 03/10/2017   CHOLHDL 6.2 03/10/2017   Lab Results  Component Value Date   HGBA1C 5.8 (H) 03/09/2017   No results found for: VITAMINB12 Lab Results  Component Value Date   TSH 1.632 03/08/2017    03/09/17 MRI / MRA brain [I reviewed images myself and agree with interpretation. -VRP]  1. Acute left thalamic infarct. 2. Mild chronic small vessel ischemic disease. 3. No large vessel occlusion. Mild stenosis of the nondominant distal left vertebral artery.  03/09/17 carotid u/s  1. Mild bilateral carotid bifurcation atherosclerotic vascular disease. No flow limiting stenosis. Degree  of stenosis less than 50% bilaterally. 2. Vertebrals are patent with antegrade flow.  03/10/17 TTE - Left ventricle: The cavity size was normal. Wall  thickness was   increased in a pattern of mild LVH. Systolic function was normal.   The estimated ejection fraction was in the range of 55% to 60%.   Wall motion was normal; there were no regional wall motion   abnormalities. Left ventricular diastolic function parameters   were normal. - Left atrium: The atrium was moderately dilated. - Atrial septum: No defect or patent foramen ovale was identified.     ASSESSMENT AND PLAN  66 y.o. year old male here with left thalamic ischemic infarction, likely due to small vessel thrombosis related to hypertension. Symptoms are gradually improving.   Dx:  1. Chronic ischemic vertebrobasilar artery thalamic stroke   2. Accelerated hypertension   3. OSA treated with BiPAP   4. COPD with asthma ([place])   5. CLL (chronic lymphocytic leukemia) ([facility])      PLAN: - continue plavix 78m daily - monitor BP --> improve BP control per PCP Blood pressure (!) 171/85 --> goal BP 120/80 - continue BiPAP for OSA - goal LDL < 70; essentially at goal so no need for statin; also stroke more likely related to hypertension - stroke warning symptoms reviewed  Return if symptoms worsen or fail to improve, for return to PCP.    VPenni Bombard MD 52/32/0094 117:91AM Certified in Neurology, Neurophysiology and Neuroimaging  [facility] [address] SM[address] [phone]

## 2017-04-27 ENCOUNTER — Encounter: Payer: Self-pay | Admitting: Family Medicine

## 2017-04-27 MED ORDER — HYDROCODONE-ACETAMINOPHEN 10-325 MG PO TABS: 1.0000 | ORAL_TABLET | Freq: Three times a day (TID) | ORAL | 0 refills | Status: DC | PRN

## 2017-05-04 ENCOUNTER — Other Ambulatory Visit: Payer: Self-pay | Admitting: Family Medicine

## 2017-05-16 ENCOUNTER — Encounter: Payer: Self-pay | Admitting: Family Medicine

## 2017-05-16 ENCOUNTER — Ambulatory Visit (INDEPENDENT_AMBULATORY_CARE_PROVIDER_SITE_OTHER): Payer: Medicare Other | Admitting: Family Medicine

## 2017-05-16 VITALS — BP 138/74 | HR 76 | Temp 98.1°F | Resp 22 | Ht 66.0 in | Wt 250.0 lb

## 2017-05-16 DIAGNOSIS — I10 Essential (primary) hypertension: Secondary | ICD-10-CM | POA: Diagnosis not present

## 2017-05-16 MED ORDER — AMLODIPINE BESYLATE 10 MG PO TABS: 10.0000 mg | ORAL_TABLET | Freq: Every day | ORAL | 2 refills | Status: DC

## 2017-05-16 MED ORDER — HYDROCODONE-ACETAMINOPHEN 10-325 MG PO TABS: 1.0000 | ORAL_TABLET | Freq: Three times a day (TID) | ORAL | 0 refills | Status: DC | PRN

## 2017-05-16 NOTE — Patient Instructions (Addendum)
Continue current medcations F/U 5 months for Physical

## 2017-05-16 NOTE — Assessment & Plan Note (Signed)
She looks okay. I think his home cuff is too small. We did compare it here and it is up 10-15 points compare try manual cuff. He given a prescription for new machine. He will continue his current regimen of amlodipine 10 mg and his metoprolol. I will follow up the labs he will have done by his oncologist this week.  His pain medication was refilled for June

## 2017-05-16 NOTE — Progress Notes (Signed)
   Subjective:    Patient ID: [name] Ball, male    DOB: 1951-10-10, 66 y.o.   MRN: [id number]  Patient presents for Follow-up (is not fasting)   Pt here too f/u chronic medical problems. Last visit in April, seen for hospital f/u due to acute Stroke. He was started on plavix. Blood pressure was controlled at that time . He was not on statin drug. He was seen by neurology on 5/22 bp was elevated to 171/85. Reviewed neurology note, statin was not indcated, LDL is already below 70 We received message from wife that BP has been quite elevated. His norvasc was increased to 10mg . Here today for recheck on blood pressure  Also on Lopressor  Feels well otherwise Has oncology labs this week  Request June script for pain meds    Review Of Systems:  GEN- denies fatigue, fever, weight loss,weakness, recent illness HEENT- denies eye drainage, change in vision, nasal discharge, CVS- denies chest pain, palpitations RESP- denies SOB, cough, wheeze ABD- denies N/V, change in stools, abd pain GU- denies dysuria, hematuria, dribbling, incontinence MSK- + joint pain, muscle aches, injury Neuro- denies headache, dizziness, syncope, seizure activity       Objective:    BP 138/74   Pulse 76   Temp 98.1 F (36.7 C) (Oral)   Resp (!) 22   Ht 5\' 6"  (1.676 m)   Wt 250 lb (113.4 kg)   SpO2 98%   BMI 40.35 kg/m  GEN- NAD, alert and oriented x3  Repeat 140/68 Right arm  HEENT- PERRL, EOMI, non injected sclera, pink conjunctiva, MMM, oropharynx clear CVS- RRR, no murmur RESP-CTAB EXT- No edema Pulses- Radial,  2+        Assessment & Plan:      Problem List Items Addressed This Visit    Essential hypertension, benign - Primary    She looks okay. I think his home cuff is too small. We did compare it here and it is up 10-15 points compare try manual cuff. He given a prescription for new machine. He will continue his current regimen of amlodipine 10 mg and his metoprolol. I will follow up the  labs he will have done by his oncologist this week.  His pain medication was refilled for June      Relevant Medications   amLODipine (NORVASC) 10 MG tablet      Note: This dictation was prepared with Dragon dictation along with smaller phrase technology. Any transcriptional errors that result from this process are unintentional.

## 2017-05-19 ENCOUNTER — Encounter (HOSPITAL_COMMUNITY): Payer: Medicare Other | Attending: Oncology

## 2017-05-19 ENCOUNTER — Other Ambulatory Visit (HOSPITAL_COMMUNITY): Payer: Medicare Other

## 2017-05-19 DIAGNOSIS — C9110 Chronic lymphocytic leukemia of B-cell type not having achieved remission: Secondary | ICD-10-CM

## 2017-05-19 LAB — CBC WITH DIFFERENTIAL/PLATELET
BLASTS: 0 %
Band Neutrophils: 0 %
Basophils Absolute: 0 10*3/uL (ref 0.0–0.1)
Basophils Relative: 0 %
EOS PCT: 3 %
Eosinophils Absolute: 1.7 10*3/uL — ABNORMAL HIGH (ref 0.0–0.7)
HEMATOCRIT: 46.2 % (ref 39.0–52.0)
HEMOGLOBIN: 15.2 g/dL (ref 13.0–17.0)
LYMPHS ABS: 51.5 10*3/uL — AB (ref 0.7–4.0)
Lymphocytes Relative: 90 %
MCH: 29.2 pg (ref 26.0–34.0)
MCHC: 32.9 g/dL (ref 30.0–36.0)
MCV: 88.7 fL (ref 78.0–100.0)
MYELOCYTES: 0 %
Metamyelocytes Relative: 0 %
Monocytes Absolute: 0 10*3/uL — ABNORMAL LOW (ref 0.1–1.0)
Monocytes Relative: 0 %
Neutro Abs: 4 10*3/uL (ref 1.7–7.7)
Neutrophils Relative %: 7 %
Other: 0 %
PLATELETS: 134 10*3/uL — AB (ref 150–400)
Promyelocytes Absolute: 0 %
RBC: 5.21 MIL/uL (ref 4.22–5.81)
RDW: 16.2 % — ABNORMAL HIGH (ref 11.5–15.5)
WBC: 57.2 10*3/uL — AB (ref 4.0–10.5)
nRBC: 0 /100 WBC

## 2017-05-19 NOTE — Progress Notes (Signed)
CRITICAL VALUE ALERT Critical value received:  WBC- 57.2  Date of notification:  05/19/17 Time of notification: 0943 Critical value read back:  Yes.   Nurse who received alert:  M.Dorrie Cocuzza, LPN  MD notified (1st page):  T.Kefalas, PA-C

## 2017-05-24 ENCOUNTER — Other Ambulatory Visit: Payer: Self-pay | Admitting: Family Medicine

## 2017-06-07 ENCOUNTER — Telehealth: Payer: Self-pay | Admitting: *Deleted

## 2017-06-07 DIAGNOSIS — G4733 Obstructive sleep apnea (adult) (pediatric): Secondary | ICD-10-CM | POA: Diagnosis not present

## 2017-06-07 NOTE — Telephone Encounter (Signed)
Records indicate prescription refill appropriate for Hydrocodone.  Ok to refill??  Last office visit/ refill 05/16/2017.

## 2017-06-07 NOTE — Telephone Encounter (Signed)
okay

## 2017-06-09 MED ORDER — HYDROCODONE-ACETAMINOPHEN 10-325 MG PO TABS: 1.0000 | ORAL_TABLET | Freq: Three times a day (TID) | ORAL | 0 refills | Status: DC | PRN

## 2017-06-09 NOTE — Telephone Encounter (Signed)
Prescription printed and patient made aware to come to office to pick up on Monday per MyChart.

## 2017-06-24 ENCOUNTER — Encounter: Payer: Self-pay | Admitting: Family Medicine

## 2017-06-30 ENCOUNTER — Encounter (HOSPITAL_COMMUNITY): Payer: Self-pay | Admitting: Adult Health

## 2017-06-30 ENCOUNTER — Encounter (HOSPITAL_COMMUNITY): Payer: Medicare Other | Attending: Oncology

## 2017-06-30 ENCOUNTER — Ambulatory Visit (HOSPITAL_COMMUNITY): Payer: Medicare Other | Admitting: Adult Health

## 2017-06-30 ENCOUNTER — Encounter (HOSPITAL_BASED_OUTPATIENT_CLINIC_OR_DEPARTMENT_OTHER): Payer: Medicare Other | Admitting: Adult Health

## 2017-06-30 VITALS — BP 125/54 | HR 61 | Resp 18 | Ht 66.0 in | Wt 247.5 lb

## 2017-06-30 DIAGNOSIS — D692 Other nonthrombocytopenic purpura: Secondary | ICD-10-CM | POA: Insufficient documentation

## 2017-06-30 DIAGNOSIS — C9110 Chronic lymphocytic leukemia of B-cell type not having achieved remission: Secondary | ICD-10-CM

## 2017-06-30 DIAGNOSIS — N183 Chronic kidney disease, stage 3 unspecified: Secondary | ICD-10-CM

## 2017-06-30 DIAGNOSIS — Z85528 Personal history of other malignant neoplasm of kidney: Secondary | ICD-10-CM | POA: Insufficient documentation

## 2017-06-30 DIAGNOSIS — M791 Myalgia: Secondary | ICD-10-CM

## 2017-06-30 DIAGNOSIS — Z8673 Personal history of transient ischemic attack (TIA), and cerebral infarction without residual deficits: Secondary | ICD-10-CM | POA: Diagnosis not present

## 2017-06-30 DIAGNOSIS — E079 Disorder of thyroid, unspecified: Secondary | ICD-10-CM | POA: Diagnosis not present

## 2017-06-30 DIAGNOSIS — R634 Abnormal weight loss: Secondary | ICD-10-CM | POA: Insufficient documentation

## 2017-06-30 DIAGNOSIS — Z79899 Other long term (current) drug therapy: Secondary | ICD-10-CM | POA: Insufficient documentation

## 2017-06-30 DIAGNOSIS — Z7902 Long term (current) use of antithrombotics/antiplatelets: Secondary | ICD-10-CM | POA: Insufficient documentation

## 2017-06-30 DIAGNOSIS — D801 Nonfamilial hypogammaglobulinemia: Secondary | ICD-10-CM | POA: Insufficient documentation

## 2017-06-30 DIAGNOSIS — Z905 Acquired absence of kidney: Secondary | ICD-10-CM | POA: Insufficient documentation

## 2017-06-30 DIAGNOSIS — J449 Chronic obstructive pulmonary disease, unspecified: Secondary | ICD-10-CM | POA: Insufficient documentation

## 2017-06-30 DIAGNOSIS — M1990 Unspecified osteoarthritis, unspecified site: Secondary | ICD-10-CM | POA: Insufficient documentation

## 2017-06-30 DIAGNOSIS — I129 Hypertensive chronic kidney disease with stage 1 through stage 4 chronic kidney disease, or unspecified chronic kidney disease: Secondary | ICD-10-CM | POA: Insufficient documentation

## 2017-06-30 DIAGNOSIS — Z87891 Personal history of nicotine dependence: Secondary | ICD-10-CM | POA: Insufficient documentation

## 2017-06-30 DIAGNOSIS — K219 Gastro-esophageal reflux disease without esophagitis: Secondary | ICD-10-CM | POA: Diagnosis not present

## 2017-06-30 LAB — CBC WITH DIFFERENTIAL/PLATELET
Basophils Absolute: 0 10*3/uL (ref 0.0–0.1)
Basophils Relative: 0 %
EOS ABS: 0.7 10*3/uL (ref 0.0–0.7)
EOS PCT: 1 %
HCT: 45.2 % (ref 39.0–52.0)
Hemoglobin: 14.5 g/dL (ref 13.0–17.0)
LYMPHS ABS: 59.4 10*3/uL — AB (ref 0.7–4.0)
Lymphocytes Relative: 90 %
MCH: 29.2 pg (ref 26.0–34.0)
MCHC: 32.1 g/dL (ref 30.0–36.0)
MCV: 90.9 fL (ref 78.0–100.0)
MONO ABS: 0.7 10*3/uL (ref 0.1–1.0)
Monocytes Relative: 1 %
NEUTROS PCT: 8 %
Neutro Abs: 5.3 10*3/uL (ref 1.7–7.7)
PLATELETS: 117 10*3/uL — AB (ref 150–400)
RBC: 4.97 MIL/uL (ref 4.22–5.81)
RDW: 16.2 % — AB (ref 11.5–15.5)
WBC: 66.1 10*3/uL — AB (ref 4.0–10.5)

## 2017-06-30 LAB — COMPREHENSIVE METABOLIC PANEL
ALT: 14 U/L — ABNORMAL LOW (ref 17–63)
AST: 17 U/L (ref 15–41)
Albumin: 4.2 g/dL (ref 3.5–5.0)
Alkaline Phosphatase: 33 U/L — ABNORMAL LOW (ref 38–126)
Anion gap: 5 (ref 5–15)
BUN: 21 mg/dL — AB (ref 6–20)
CHLORIDE: 106 mmol/L (ref 101–111)
CO2: 25 mmol/L (ref 22–32)
Calcium: 8.8 mg/dL — ABNORMAL LOW (ref 8.9–10.3)
Creatinine, Ser: 1.58 mg/dL — ABNORMAL HIGH (ref 0.61–1.24)
GFR, EST AFRICAN AMERICAN: 51 mL/min — AB (ref >60)
GFR, EST NON AFRICAN AMERICAN: 44 mL/min — AB (ref >60)
Glucose, Bld: 134 mg/dL — ABNORMAL HIGH (ref 65–99)
POTASSIUM: 4.3 mmol/L (ref 3.5–5.1)
SODIUM: 136 mmol/L (ref 135–145)
Total Bilirubin: 0.9 mg/dL (ref 0.3–1.2)
Total Protein: 6.3 g/dL — ABNORMAL LOW (ref 6.5–8.1)

## 2017-06-30 LAB — LACTATE DEHYDROGENASE: LDH: 111 U/L (ref 98–192)

## 2017-06-30 NOTE — Progress Notes (Signed)
[place], [place] [address]   CLINIC:  Medical Oncology/Hematology  PCP:  Alycia Rossetti, MD [address] [place] [address] [phone]   REASON FOR VISIT:  Follow-up for CLL & hypogammaglobulinemia   CURRENT THERAPY: Observation for treatment parameters    HISTORY OF PRESENT ILLNESS:  (From Dr. Donald Pore last note on 12/08/16)     INTERVAL HISTORY:  [name] Ball 66 y.o. male returns for follow-up for CLL and hypogammaglobulinemia.   He is here today with his wife; ambulates with a cane.   Overall, he tells me he has been doing well. He has been working hard on regaining his strength since his most recent stroke in 03/2017. (R) facial numbness remains.  Continues to struggle with arthritis and COPD, but overall these issues are stable.  He has been getting regular massage therapy, which has been very helpful for him as well.    Appetite is 100%; he is able to eat a variety of foods without dysphagia.  He and his wife tell me that they have been working on eating more healthy foods and limiting salt; he is trying to gradually lose some weight. Chart reviewed; weight is down ~5 lbs since 03/2017 (but this has been intentional weight loss).  He has occasional fatigue, which he attributes mostly to his arthritis and COPD.  Denies any easy bleeding or bruising. Denies night sweats or fevers.    Reports recent of back pain near his ribs after reaching for something above his head recently. Massage has been helpful, but this area bilaterally remains tender/painful.  Denies any recent trauma; he and his wife think it is muscular pain.    Of note, patient's wife speaks/translates with sign language for patient during much of today's visit. Patient with chronic hearing impairment.    REVIEW OF SYSTEMS:  Review of Systems  Constitutional: Positive for fatigue. Negative for chills, diaphoresis and fever.  HENT:  Negative.  Negative for  lump/mass and nosebleeds. Hearing loss: chronic.   Eyes: Negative.   Respiratory: Negative.  Negative for cough and shortness of breath.   Cardiovascular: Negative.  Negative for chest pain and leg swelling.  Gastrointestinal: Negative.  Negative for abdominal pain, blood in stool, constipation, diarrhea, nausea and vomiting.  Endocrine: Negative.   Genitourinary: Negative.  Negative for dysuria and hematuria.   Musculoskeletal: Positive for back pain and myalgias. Negative for arthralgias.  Skin: Negative.  Negative for rash.  Neurological: Negative.  Negative for dizziness and headaches.  Hematological: Negative.  Negative for adenopathy. Does not bruise/bleed easily.  Psychiatric/Behavioral: Negative.  Negative for depression and sleep disturbance. The patient is not nervous/anxious.      PAST MEDICAL/SURGICAL HISTORY:  Past Medical History:  Diagnosis Date  . Allergy   . Arthritis   . Asthma   . Cancer ([facility])    kidney  . CLL (chronic lymphocytic leukemia) ([facility]) 2016  . COPD (chronic obstructive pulmonary disease) ([facility])   . Diverticulitis   . Diverticulosis    colonoscopy 3/15  . GERD (gastroesophageal reflux disease)   . Hypertension   . Low testosterone   . Migraine   . Shingles 03/31/2016  . Solar purpura ([facility])   . Thyroid disease    hypothryoid  . Ulcer   . Ventral hernia FEB 2015 CT    Large infra umbilical ventral hernia containing loops of small   Past Surgical History:  Procedure Laterality Date  . abdominal mesh  inserted  june 2006, may 2008,dec 2010  . CHOLECYSTECTOMY  june 2007  . COLON SURGERY  sept 2007  . COLONOSCOPY    . COLONOSCOPY N/A 02/20/2014   Procedure: COLONOSCOPY;  Surgeon: Danie Binder, MD;  Location: [facility];  Service: Endoscopy;  Laterality: N/A;  . HERNIA REPAIR  05/2005 5/20108, 11/2009   with mesh  . KIDNEY CYST REMOVAL  jan 2005   renal cell carcinoma  . knee minescus repair Left feb 2007  . NEPHRECTOMY Left 12/2003      SOCIAL HISTORY:  He says he's been married 48 years. He has 4 children and 6 grandchildren.  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 He also cooks. He also worked in Public relations account executive.  He used to smoke. He quit about 17 years ago. He says he has a shot of whisky one or two nights a week, so he doesn't have problems with alcohol. He may, however, have a problem with soda -- Solicitor.  In terms of his hobbies, he jokes that he "likes to do nothing." He cooks. He says he's the "second best cook on this side of the [place], but I'm not bragging." [place] food is his specialty, he says. He says he makes an excellent leg of lamb, and a great Moussaka. His wife makes a good Baklava. He also has a good [place] chicken recipe.  Why [place] food? When he was younger, he cooked and worked in [place], [place] with a family that was all [place].  They had a [place] festival coming up. So grandma came in and they cooked for the [place] -- maybe 2500 people.  She showed him each recipe and she was really tough. He says it really stuck with him ever since. He says when he gets his recipes, he gets them from families; family recipes. He says he's got about 3,000 of them. He says he doesn't give his recipes to anybody, but he'll make them. He says "I make the best pizza in the [place]." He says he makes a good soup, too. It's clear that he loves to cook very much. Social History   Social History  . Marital status: Married    Spouse name: Zigmund Daniel  . Number of children: 4  . Years of education: 12   Occupational History  .      retired   Social History Main Topics  . Smoking status: Former Smoker    Quit date: 12/06/1996  . Smokeless tobacco: Never Used  . Alcohol use Yes     Comment: a few shots a few times a month  . Drug use: No  . Sexual activity: Not on  file   Other Topics Concern  . 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   Lives at home with wife, daughter, family   Caffeine- sodas, 2 daily    FAMILY HISTORY:  Family History  Problem Relation Age of Onset  . Cancer Mother        lung, met to brain  . Diabetes Mellitus II Sister   . Colon cancer Neg Hx     CURRENT MEDICATIONS:  Outpatient Encounter Prescriptions as of 06/30/2017  Medication Sig  . acetaminophen (TYLENOL) 325 MG tablet Take 650 mg by  mouth every 6 (six) hours as needed.  Marland Kitchen albuterol (PROVENTIL) (2.5 MG/3ML) 0.083% nebulizer solution TAKE 3MLS(1 VIAL) VIA NEBULIZER EVERY 6 HOURS AS NEEDED FOR WHEEZING OR SHORTNESS OF BREATH  . allopurinol (ZYLOPRIM) 100 MG tablet TAKE 1 TABLET(100 MG) BY MOUTH AT BEDTIME  . amLODipine (NORVASC) 10 MG tablet Take 1 tablet (10 mg total) by mouth daily.  . ATROVENT HFA 17 MCG/ACT inhaler INHALE 2 PUFFS BY MOUTH INTO THE LUNGS EVERY 6 HOURS  . calcium carbonate (TUMS - DOSED IN MG ELEMENTAL CALCIUM) 500 MG chewable tablet Chew 2-3 tablets by mouth once as needed for indigestion or heartburn. Reported on 11/21/2015  . citalopram (CELEXA) 10 MG tablet TAKE 1 TABLET(10 MG) BY MOUTH DAILY  . clopidogrel (PLAVIX) 75 MG tablet Take 1 tablet (75 mg total) by mouth daily.  . diphenhydrAMINE (BENADRYL) 25 MG tablet Take 25 mg by mouth every evening.   . Glucosamine-Chondroit-Vit C-Mn (GLUCOSAMINE 1500 COMPLEX) CAPS Take 1,200 capsules by mouth 2 (two) times daily.  Marland Kitchen HYDROcodone-acetaminophen (NORCO) 10-325 MG tablet Take 1-2 tablets by mouth every 8 (eight) hours as needed.  . levalbuterol (XOPENEX HFA) 45 MCG/ACT inhaler Inhale 1-2 puffs into the lungs every 4 (four) hours as needed for wheezing.  Marland Kitchen levothyroxine (SYNTHROID, LEVOTHROID) 50 MCG tablet TAKE 1 TABLET(50 MCG) BY MOUTH DAILY BEFORE BREAKFAST  . loratadine (CLARITIN) 10 MG tablet Take 10 mg by mouth every morning.   .  metoprolol tartrate (LOPRESSOR) 50 MG tablet TAKE 1 TABLET(50 MG) BY MOUTH TWICE DAILY  . mometasone (ASMANEX 60 METERED DOSES) 220 MCG/INH inhaler Inhale 2 puffs into the lungs 2 (two) times daily.  . Multiple Vitamin (MULTIVITAMIN WITH MINERALS) TABS tablet Take 1 tablet by mouth daily.  . Omega-3 Fatty Acids (FISH OIL) 1000 MG CPDR Take by mouth 2 (two) times daily.  . ranitidine (ZANTAC) 75 MG tablet Take 75 mg by mouth 2 (two) times daily.  Marland Kitchen testosterone cypionate (DEPOTESTOSTERONE CYPIONATE) 200 MG/ML injection INJECT 1.5 MLS INTO THE MUSCLE EVERY 14 DAYS  . vitamin B-12 (CYANOCOBALAMIN) 1000 MCG tablet Take 1,000 mcg by mouth every morning.   . vitamin C (ASCORBIC ACID) 500 MG tablet Take 500 mg by mouth 2 (two) times daily.  . vitamin E 400 UNIT capsule Take 400 Units by mouth every morning.    No facility-administered encounter medications on file as of 06/30/2017.     ALLERGIES:  Allergies  Allergen Reactions  . Contrast Media [Iodinated Diagnostic Agents]     Single functioning kidney  . Other Hives    Neoprene fabric Strawberry   . Ether Rash    Took a long time to wake up Doesn't wake up from it     PHYSICAL EXAM:  ECOG Performance status: 1-2 - Symptomatic; requires occasional assistance  Vitals:   06/30/17 1105  BP: (!) 125/54  Pulse: 61  Resp: 18   Filed Weights   06/30/17 1105  Weight: 247 lb 8 oz (112.3 kg)    Physical Exam  Constitutional: He is oriented to person, place, and time and well-developed, well-nourished, and in no distress.  HENT:  Head: Normocephalic.  Mouth/Throat: Oropharynx is clear and moist. No oropharyngeal exudate.  Hearing deficit; patient's wife uses sign language to communicate periodically.   Eyes: Pupils are equal, round, and reactive to light. Conjunctivae are normal. No scleral icterus.  Neck: Normal range of motion. Neck supple.  Cardiovascular: Normal rate and regular rhythm.   Pulmonary/Chest: Effort normal. No  respiratory distress. He  has no wheezes.  -Diminished breath sounds to bilat bases.  -Tenderness to palpation to posterior lower ribs bilaterally  Abdominal: Soft. Bowel sounds are normal.  Musculoskeletal: Normal range of motion. He exhibits edema (Trace BLE edema).  Ambulates with cane  -Able to get onto exam table with only guarded assistance  Lymphadenopathy:    He has no cervical adenopathy.  Neurological: He is alert and oriented to person, place, and time. No cranial nerve deficit.  -Numbness remains to (R) face in the maxillary/mandibular area    Skin: Skin is warm. No rash noted.  Psychiatric: Mood, memory, affect and judgment normal.  Nursing note and vitals reviewed.    LABORATORY DATA:  I have reviewed the labs as listed.  CBC    Component Value Date/Time   WBC 66.1 (HH) 06/30/2017 1034   RBC 4.97 06/30/2017 1034   HGB 14.5 06/30/2017 1034   HCT 45.2 06/30/2017 1034   PLT 117 (L) 06/30/2017 1034   MCV 90.9 06/30/2017 1034   MCH 29.2 06/30/2017 1034   MCHC 32.1 06/30/2017 1034   RDW 16.2 (H) 06/30/2017 1034   LYMPHSABS 59.4 (H) 06/30/2017 1034   MONOABS 0.7 06/30/2017 1034   EOSABS 0.7 06/30/2017 1034   BASOSABS 0.0 06/30/2017 1034   CMP Latest Ref Rng & Units 06/30/2017 04/07/2017 03/08/2017  Glucose 65 - 99 mg/dL 134(H) 132(H) 133(H)  BUN 6 - 20 mg/dL 21(H) 21(H) 16  Creatinine 0.61 - 1.24 mg/dL 1.58(H) 1.51(H) 1.60(H)  Sodium 135 - 145 mmol/L 136 136 139  Potassium 3.5 - 5.1 mmol/L 4.3 4.5 4.3  Chloride 101 - 111 mmol/L 106 106 104  CO2 22 - 32 mmol/L 25 23 -  Calcium 8.9 - 10.3 mg/dL 8.8(L) 9.1 -  Total Protein 6.5 - 8.1 g/dL 6.3(L) 6.4(L) -  Total Bilirubin 0.3 - 1.2 mg/dL 0.9 0.6 -  Alkaline Phos 38 - 126 U/L 33(L) 38 -  AST 15 - 41 U/L 17 23 -  ALT 17 - 63 U/L 14(L) 23 -      Results for EDGAR, REISZ (MRN [id number])  Ref. Range 06/30/2017 10:34  [facility] Latest Ref Range: 98 - 192 U/L 111    PENDING LABS:    DIAGNOSTIC IMAGING:  MRI Brain:  03/09/17     PATHOLOGY:  Path smear review: 11/10/16      ASSESSMENT & PLAN:   Chronic lymphocytic leukemia/SLL:  -WBCs remain elevated and are 66.1 today. Plts continuing to trend down and are 117,000. Hgb remains preserved and normal.  No active bleeding.   -Weight loss (down ~5 lbs since 03/2017). He has been actively trying to lose weight by making more healthy food choices. Exercise is hard given his COPD and h/o stroke.  No palpable adenopathy on exam.  Continues to have intermittent fatigue, which he attributes to his arthritis and COPD. Denies any fevers or night sweats. -Reviewed NCCN Guidelines for treatment for CLL with patient and his wife.  They understand that we consider both the lab values and patient's symptoms before we initiate treatment.  Currently, he does not meet necessary criteria to start any treatment we will continue to monitor with serial labs and office visits.   -Patient's wife requesting to lengthen the time between his lab visits and follow-up appts at the cancer center d/t multiple appointments with other providers given his comorbid conditions.  Previously, we were obtaining labs every 6 weeks. Based on stability of his labs (with only minor worsening of platelet count), will  change standing orders to labs every 8 weeks.  -Return to cancer center in 4 months for follow-up visit with labs.    NCCN guidelines for indication for treatment of CLL are:  A. Eligible for clinical trial  B. Significant disease-related symptoms   1. Fatigue (severe)   2. Night sweats   3. Weight loss   4. Fever without infection  C. Threatened end-organ function  D. Progressive bulky disease (spleen >6cm below costal margin, lymph nodes >10 cm)  E. Progressive anemia  F. Progressive thrombocytopenia.   Muscle pain:  -Endorses tenderness/pain to bilateral posterior ribs after over-reaching for something above his head. Massage therapy and heat have been helpful in improving his  symptoms. Not likely to be secondary to CLL and sounds more like muscle strain; encouraged continued conservative management strategies as tolerated. Follow-up with PCP if symptoms do not resolve.   Recent TIA:  -Currently on Plavix. Clinically continuing to improve in terms of his strength and stamina.  Continues to ambulate with cane.  -Continue follow-up with neurology as directed.    Hypogammaglobulinemia:  -Historically treated with several doses of IVIG.  IVIG infusions were stopped d/t cost. No recent infections. -IgG, IgA, and IgM immunoglobulin lab results pending.   Weight loss:  -Now weight loss is intentional, as patient has been limiting salt and eating more healthy food options.  Weight is down ~5 lbs in past 3 months. He is hoping that continued gradual weight loss will help with his comorbid conditions, which is certainly reasonable.  He understands that unintentional weight loss is a cause for concern in terms of his malignancy.  We will continue to monitor.   History of kidney cancer:  -History of kidney cancer s/p nephrectomy.  -BUN/CRE chronically elevated, but stable; 21/1.58 today. EGFR 44 indicating stage 3 CKD.  -Patient should not receive contrast for scans given CKD and previous nephrectomy.       Dispo:  -Labs only every 2 months (standing orders for CBC with diff) -Return to cancer center in 4 months with labs (CBC with diff, CMET, LDH, and IgG/IgA/IgM orders placed).     All questions were answered to patient's stated satisfaction. Encouraged patient to call with any new concerns or questions before his next visit to the cancer center and we can certain see him sooner, if needed.    Plan of care discussed with Dr. Talbert Cage, who agrees with the above aforementioned.     Orders placed this encounter:  Orders Placed This Encounter  Procedures  . CBC with Differential/Platelet  . CBC with Differential/Platelet  . Comprehensive metabolic panel  . IgG, IgA,  IgM  . Lactate dehydrogenase      [name] Craze, NP [place] [phone]

## 2017-06-30 NOTE — Progress Notes (Signed)
CRITICAL VALUE ALERT Critical value received:  WBC- 66.1 Date of notification:  06/30/17 Time of notification: 5248 Critical value read back:  Yes.   Nurse who received alert:  M.Jayziah Bankhead, LPN MD notified (1st page):  G.Dawson, NP

## 2017-07-01 LAB — IGG, IGA, IGM
IGA: 25 mg/dL — AB (ref 61–437)
IGG (IMMUNOGLOBIN G), SERUM: 415 mg/dL — AB (ref 700–1600)

## 2017-07-16 ENCOUNTER — Other Ambulatory Visit: Payer: Self-pay | Admitting: Family Medicine

## 2017-07-27 ENCOUNTER — Encounter: Payer: Self-pay | Admitting: Family Medicine

## 2017-07-27 MED ORDER — HYDROCODONE-ACETAMINOPHEN 10-325 MG PO TABS: 1.0000 | ORAL_TABLET | Freq: Three times a day (TID) | ORAL | 0 refills | Status: DC | PRN

## 2017-08-04 ENCOUNTER — Other Ambulatory Visit: Payer: Self-pay | Admitting: Family Medicine

## 2017-08-09 DIAGNOSIS — G4733 Obstructive sleep apnea (adult) (pediatric): Secondary | ICD-10-CM | POA: Diagnosis not present

## 2017-08-22 ENCOUNTER — Other Ambulatory Visit: Payer: Self-pay | Admitting: Family Medicine

## 2017-08-25 ENCOUNTER — Encounter: Payer: Self-pay | Admitting: Family Medicine

## 2017-08-26 MED ORDER — HYDROCODONE-ACETAMINOPHEN 10-325 MG PO TABS: 1.0000 | ORAL_TABLET | Freq: Three times a day (TID) | ORAL | 0 refills | Status: DC | PRN

## 2017-08-29 ENCOUNTER — Other Ambulatory Visit: Payer: Self-pay | Admitting: Family Medicine

## 2017-08-29 NOTE — Telephone Encounter (Signed)
Medication refilled per protocol. 

## 2017-08-31 ENCOUNTER — Encounter (HOSPITAL_COMMUNITY): Payer: Medicare Other | Attending: Oncology

## 2017-08-31 ENCOUNTER — Telehealth (HOSPITAL_COMMUNITY): Payer: Self-pay | Admitting: Emergency Medicine

## 2017-08-31 DIAGNOSIS — Z85528 Personal history of other malignant neoplasm of kidney: Secondary | ICD-10-CM

## 2017-08-31 DIAGNOSIS — N183 Chronic kidney disease, stage 3 unspecified: Secondary | ICD-10-CM

## 2017-08-31 DIAGNOSIS — C9110 Chronic lymphocytic leukemia of B-cell type not having achieved remission: Secondary | ICD-10-CM | POA: Diagnosis not present

## 2017-08-31 LAB — CBC WITH DIFFERENTIAL/PLATELET
BASOS ABS: 0 10*3/uL (ref 0.0–0.1)
Band Neutrophils: 0 %
Basophils Relative: 0 %
Blasts: 0 %
EOS ABS: 3.2 10*3/uL — AB (ref 0.0–0.7)
EOS PCT: 4 %
HEMATOCRIT: 46.9 % (ref 39.0–52.0)
HEMOGLOBIN: 15.2 g/dL (ref 13.0–17.0)
LYMPHS ABS: 70.8 10*3/uL — AB (ref 0.7–4.0)
LYMPHS PCT: 89 %
MCH: 30.3 pg (ref 26.0–34.0)
MCHC: 32.4 g/dL (ref 30.0–36.0)
MCV: 93.4 fL (ref 78.0–100.0)
MYELOCYTES: 0 %
Metamyelocytes Relative: 0 %
Monocytes Absolute: 0.8 10*3/uL (ref 0.1–1.0)
Monocytes Relative: 1 %
NEUTROS PCT: 6 %
NRBC: 0 /100{WBCs}
Neutro Abs: 4.8 10*3/uL (ref 1.7–7.7)
Other: 0 %
PROMYELOCYTES ABS: 0 %
Platelets: 131 10*3/uL — ABNORMAL LOW (ref 150–400)
RBC: 5.02 MIL/uL (ref 4.22–5.81)
RDW: 13.9 % (ref 11.5–15.5)
WBC: 79.6 10*3/uL — AB (ref 4.0–10.5)

## 2017-08-31 NOTE — Telephone Encounter (Signed)
CRITICAL VALUE ALERT Critical value received:  WBC 79.6 Date of notification:  08/31/2017 Time of notification: 0352 Critical value read back:  Yes.   Nurse who received alert:  Joanne Gavel RN MD notified (1st page):  Dr Talbert Cage

## 2017-09-09 ENCOUNTER — Encounter: Payer: Self-pay | Admitting: Family Medicine

## 2017-09-13 ENCOUNTER — Ambulatory Visit (INDEPENDENT_AMBULATORY_CARE_PROVIDER_SITE_OTHER): Payer: Medicare Other | Admitting: Family Medicine

## 2017-09-13 ENCOUNTER — Encounter: Payer: Self-pay | Admitting: Family Medicine

## 2017-09-13 VITALS — BP 138/68 | HR 52 | Temp 97.9°F | Resp 16 | Ht 66.0 in | Wt 238.0 lb

## 2017-09-13 DIAGNOSIS — J449 Chronic obstructive pulmonary disease, unspecified: Secondary | ICD-10-CM | POA: Diagnosis not present

## 2017-09-13 DIAGNOSIS — C9190 Lymphoid leukemia, unspecified not having achieved remission: Secondary | ICD-10-CM

## 2017-09-13 DIAGNOSIS — J0100 Acute maxillary sinusitis, unspecified: Secondary | ICD-10-CM | POA: Diagnosis not present

## 2017-09-13 DIAGNOSIS — C9110 Chronic lymphocytic leukemia of B-cell type not having achieved remission: Secondary | ICD-10-CM

## 2017-09-13 MED ORDER — AMOXICILLIN-POT CLAVULANATE 875-125 MG PO TABS: 1.0000 | ORAL_TABLET | Freq: Two times a day (BID) | ORAL | 0 refills | Status: DC

## 2017-09-13 MED ORDER — PREDNISONE 10 MG PO TABS: ORAL_TABLET | ORAL | 0 refills | Status: DC

## 2017-09-13 MED ORDER — ONDANSETRON HCL 4 MG PO TABS: 4.0000 mg | ORAL_TABLET | Freq: Three times a day (TID) | ORAL | 0 refills | Status: DC | PRN

## 2017-09-13 NOTE — Patient Instructions (Addendum)
Take the antibiotics Nasal spray  zofran for nausea  F/U 3 months

## 2017-09-13 NOTE — Assessment & Plan Note (Signed)
Currently stable but I did go ahead and give him a prescription for prednisone in case it does move from his sinuses into his chest which has happened on multiple occasions. He also has his nebulizer medicine. I'll treat the acute sinusitis with Augmentin he is to contact use nasal saline/nasal steroid as needed.  For his CLL will defer to oncology for treatment. I will copy his oncologist on this note

## 2017-09-13 NOTE — Progress Notes (Signed)
   Subjective:    Patient ID: [name] Ball, male    DOB: 22-Dec-1950, 66 y.o.   MRN: [id number]  Patient presents for Head cold (Nausea, HA, ST) and Problem with saliva gland   Pt here with head cold and No significant cough, mostly sinus pressure drainage, has been taking OTC cough medicines for the past week  No fever or chills.  Also had  Enlarged node  has been swelling for the past few weeksHe had some discomfort in his mouth as well but no discrete abscess. He has also had some dizziness with his symptoms He does have underlying CLL and he had his labs drawn recently his white blood cell count was up to 79,000 at this time they're still monitoring and he has appointment in November.  He has not required the use of his nebulizer  If he did have a fall about 6 weeks ago he slipped on some stairs and hit his head. He not have any loss of consciousness and no other symptoms he self treated at home states that he has been fine since then  His wife is interpreting Review Of Systems:  GEN- + fatigue, fever, weight loss,weakness, recent illness HEENT- denies eye drainage, change in vision, nasal discharge, CVS- denies chest pain, palpitations RESP- denies SOB, cough, wheeze ABD- denies N/V, change in stools, abd pain GU- denies dysuria, hematuria, dribbling, incontinence MSK- denies joint pain, muscle aches, injury Neuro- denies headache,+ dizziness, syncope, seizure activity       Objective:    BP 138/68   Pulse (!) 52   Temp 97.9 F (36.6 C) (Oral)   Resp 16   Ht 5\' 6"  (1.676 m)   Wt 238 lb (108 kg)   SpO2 97%   BMI 38.41 kg/m  GEN- NAD, alert and oriented x3,flushed in face HEENT- PERRL, EOMI, non injected sclera, pink conjunctiva, MMM, oropharynx mild injection, TM clear bilat no effusion,  + maxillary sinus tenderness, inflammed turbinates,  Nasal drainage  Neck- Supple, shotty  LAD, no supraclaviular nodes  CVS- RRR, no murmur RESP-CTAB EXT- No edema Pulses- Radial  2+          Assessment & Plan:      Problem List Items Addressed This Visit      Unprioritized   CLL (chronic lymphocytic leukemia) ([facility])   Relevant Medications   amoxicillin-clavulanate (AUGMENTIN) 875-125 MG tablet   ondansetron (ZOFRAN) 4 MG tablet   predniSONE (DELTASONE) 10 MG tablet   COPD with asthma ([facility])    Currently stable but I did go ahead and give him a prescription for prednisone in case it does move from his sinuses into his chest which has happened on multiple occasions. He also has his nebulizer medicine. I'll treat the acute sinusitis with Augmentin he is to contact use nasal saline/nasal steroid as needed.  For his CLL will defer to oncology for treatment. I will copy his oncologist on this note      Relevant Medications   predniSONE (DELTASONE) 10 MG tablet    Other Visit Diagnoses    Acute maxillary sinusitis, recurrence not specified    -  Primary   Relevant Medications   amoxicillin-clavulanate (AUGMENTIN) 875-125 MG tablet   predniSONE (DELTASONE) 10 MG tablet      Note: This dictation was prepared with Dragon dictation along with smaller phrase technology. Any transcriptional errors that result from this process are unintentional.

## 2017-09-27 ENCOUNTER — Encounter: Payer: Self-pay | Admitting: Family Medicine

## 2017-09-27 MED ORDER — HYDROCODONE-ACETAMINOPHEN 10-325 MG PO TABS: 1.0000 | ORAL_TABLET | Freq: Three times a day (TID) | ORAL | 0 refills | Status: DC | PRN

## 2017-09-29 ENCOUNTER — Other Ambulatory Visit: Payer: Self-pay | Admitting: Family Medicine

## 2017-10-03 ENCOUNTER — Encounter: Payer: Self-pay | Admitting: Family Medicine

## 2017-10-24 ENCOUNTER — Other Ambulatory Visit: Payer: Self-pay | Admitting: Family Medicine

## 2017-10-24 ENCOUNTER — Encounter: Payer: Self-pay | Admitting: Family Medicine

## 2017-10-24 MED ORDER — HYDROCODONE-ACETAMINOPHEN 10-325 MG PO TABS: 1.0000 | ORAL_TABLET | Freq: Three times a day (TID) | ORAL | 0 refills | Status: DC | PRN

## 2017-10-30 NOTE — Progress Notes (Signed)
[place], [place] [address]   CLINIC:  Medical Oncology/Hematology  PCP:  Alycia Rossetti, MD [address] [place] [address] [phone]   REASON FOR VISIT:  Follow-up for CLL & hypogammaglobulinemia   CURRENT THERAPY: Observation for treatment parameters    HISTORY OF PRESENT ILLNESS:  (From Dr. Donald Pore last note on 12/08/16)       INTERVAL HISTORY:  Mr. Shaddock 66 y.o. male returns for follow-up for CLL and hypogammaglobulinemia.   He is here today with his wife; ambulates with a cane.   Overall, he tells me he has been doing "pretty good."  His appetite is 100%.  Energy levels and fatigue are a bit worse than 4 months ago per his report (energy levels 25%). He attributes some of his fatigue to his COPD and chronic arthralgias, as well as continued recovery from recent stroke. His wife shares with me that when his pain is worse, he sleeps more.  His pain is managed by outside provider. He generally takes ~4 hour nap per day and sleeps from about midnight to 8 am at night.  His wife states that his long naps are not new and have been his pattern for years.  He continues to have (R) facial numbness, as well as (R) arm numbness at times as well.  He does not follow with a neurologist; "there's nothing they can do!"   He has felt some dizziness, which he feels is worse in the past few weeks. He admittedly does not drink much water. "I hate water!"  He has been working on reducing his salt intake and eating more healthy foods.  He continues to intentionally work on losing weight. His weight is down ~6 lbs in the past 1 month; down ~18 lbs since 05/2017.  His goal weight is ~200 lbs.    Denies any fevers. Endorses occasional sweats and chills. His wife states this is when he exerts himself he has episode of sweating and then will have a chill.  He has periodic night sweats, but they are not drenching. He reports having anxiety about  the sweats; "Does this mean my cancer is getting worse?"  Denies any bleeding episodes or abnormal bruising.  He did have a sinus infection in October that was treated with antibiotics. No other infections that they are aware of.    They tell me that they have 2 new kittens that they are really enjoying playing with.    Of note, patient's wife speaks/translates with sign language for patient during much of today's visit. Patient with chronic hearing impairment.      REVIEW OF SYSTEMS:  Review of Systems  Constitutional: Positive for chills, diaphoresis and fatigue. Negative for appetite change and fever.  HENT:   Positive for mouth sores ("sometimes my mouth gets sore when my saliva gland gets blocked sometimes. I don't have any problems with it right now." ).   Eyes: Negative.   Respiratory: Negative.   Cardiovascular: Negative for chest pain.  Gastrointestinal: Positive for constipation.  Endocrine: Negative.   Genitourinary: Negative.  Negative for dysuria and hematuria.   Musculoskeletal: Positive for arthralgias and back pain.  Neurological: Positive for dizziness and extremity weakness.  Hematological: Does not bruise/bleed easily.  Psychiatric/Behavioral: The patient is nervous/anxious.      PAST MEDICAL/SURGICAL HISTORY:  Past Medical History:  Diagnosis Date  . Allergy   . Arthritis   . Asthma   . Cancer (  [facility])    kidney  . CLL (chronic lymphocytic leukemia) ([facility]) 2016  . COPD (chronic obstructive pulmonary disease) ([facility])   . Diverticulitis   . Diverticulosis    colonoscopy 3/15  . GERD (gastroesophageal reflux disease)   . Hypertension   . Low testosterone   . Migraine   . Shingles 03/31/2016  . Solar purpura ([facility])   . Thyroid disease    hypothryoid  . Ulcer   . Ventral hernia FEB 2015 CT    Large infra umbilical ventral hernia containing loops of small   Past Surgical History:  Procedure Laterality Date  . abdominal mesh inserted  june 2006, may 2008,dec  2010  . CHOLECYSTECTOMY  june 2007  . COLON SURGERY  sept 2007  . COLONOSCOPY    . COLONOSCOPY N/A 02/20/2014   Procedure: COLONOSCOPY;  Surgeon: Danie Binder, MD;  Location: [facility];  Service: Endoscopy;  Laterality: N/A;  . HERNIA REPAIR  05/2005 5/20108, 11/2009   with mesh  . KIDNEY CYST REMOVAL  jan 2005   renal cell carcinoma  . knee minescus repair Left feb 2007  . NEPHRECTOMY Left 12/2003     SOCIAL HISTORY:  He says he's been married 39 years. He has 4 children and 6 grandchildren.  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 He also cooks. He also worked in Public relations account executive.  He used to smoke. He quit about 17 years ago. He says he has a shot of whisky one or two nights a week, so he doesn't have problems with alcohol. He may, however, have a problem with soda -- Solicitor.  In terms of his hobbies, he jokes that he "likes to do nothing." He cooks. He says he's the "second best cook on this side of the [place], but I'm not bragging." [place] food is his specialty, he says. He says he makes an excellent leg of lamb, and a great Moussaka. His wife makes a good Baklava. He also has a good [place] chicken recipe.  Why [place] food? When he was younger, he cooked and worked in [place], [place] with a family that was all [place].  They had a [place] festival coming up. So grandma came in and they cooked for the [place] -- maybe 2500 people.  She showed him each recipe and she was really tough. He says it really stuck with him ever since. He says when he gets his recipes, he gets them from families; family recipes. He says he's got about 3,000 of them. He says he doesn't give his recipes to anybody, but he'll make them. He says "I make the best pizza in the [place]." He says he makes a good soup, too. It's clear that he loves to cook very  much. Social History   Socioeconomic History  . Marital status: Married    Spouse name: Zigmund Daniel  . Number of children: 4  . Years of education: 53  . Highest education level: Not on file  Social Needs  . Financial resource strain: Not on file  . Food insecurity - worry: Not on file  . Food insecurity - inability: Not on file  . Transportation needs - medical: Not on file  . Transportation needs - non-medical: Not on file  Occupational History    Comment: retired  Tobacco Use  . Smoking status:  Former Smoker    Last attempt to quit: 12/06/1996    Years since quitting: 20.9  . Smokeless tobacco: Never Used  Substance and Sexual Activity  . Alcohol use: Yes    Comment: a few shots a few times a month  . Drug use: No  . Sexual activity: Not on file  Other Topics Concern  . 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   Lives at home with wife, daughter, family   Caffeine- sodas, 2 daily    FAMILY HISTORY:  Family History  Problem Relation Age of Onset  . Cancer Mother        lung, met to brain  . Diabetes Mellitus II Sister   . Colon cancer Neg Hx     CURRENT MEDICATIONS:  Outpatient Encounter Medications as of 11/01/2017  Medication Sig  . acetaminophen (TYLENOL) 325 MG tablet Take 650 mg by mouth every 6 (six) hours as needed.  Marland Kitchen albuterol (PROVENTIL) (2.5 MG/3ML) 0.083% nebulizer solution TAKE 3MLS(1 VIAL) VIA NEBULIZER EVERY 6 HOURS AS NEEDED FOR WHEEZING OR SHORTNESS OF BREATH  . allopurinol (ZYLOPRIM) 100 MG tablet TAKE 1 TABLET(100 MG) BY MOUTH AT BEDTIME  . amLODipine (NORVASC) 10 MG tablet Take 1 tablet (10 mg total) by mouth daily.  Marland Kitchen amoxicillin-clavulanate (AUGMENTIN) 875-125 MG tablet Take 1 tablet by mouth 2 (two) times daily.  . ATROVENT HFA 17 MCG/ACT inhaler INHALE 2 PUFFS BY MOUTH INTO THE LUNGS EVERY 6 HOURS  . calcium carbonate (TUMS - DOSED IN MG ELEMENTAL CALCIUM) 500 MG chewable tablet Chew 2-3  tablets by mouth once as needed for indigestion or heartburn. Reported on 11/21/2015  . citalopram (CELEXA) 10 MG tablet TAKE 1 TABLET(10 MG) BY MOUTH DAILY  . clopidogrel (PLAVIX) 75 MG tablet TAKE 1 TABLET(75 MG) BY MOUTH DAILY  . diphenhydrAMINE (BENADRYL) 25 MG tablet Take 25 mg by mouth every evening.   . Glucosamine-Chondroit-Vit C-Mn (GLUCOSAMINE 1500 COMPLEX) CAPS Take 1,200 capsules by mouth 2 (two) times daily.  Marland Kitchen HYDROcodone-acetaminophen (NORCO) 10-325 MG tablet Take 1-2 tablets every 8 (eight) hours as needed by mouth.  . levalbuterol (XOPENEX HFA) 45 MCG/ACT inhaler Inhale 1-2 puffs into the lungs every 4 (four) hours as needed for wheezing.  Marland Kitchen levothyroxine (SYNTHROID, LEVOTHROID) 50 MCG tablet TAKE 1 TABLET(50 MCG) BY MOUTH DAILY BEFORE BREAKFAST  . loratadine (CLARITIN) 10 MG tablet Take 10 mg by mouth every morning.   . metoprolol tartrate (LOPRESSOR) 50 MG tablet TAKE 1 TABLET(50 MG) BY MOUTH TWICE DAILY  . mometasone (ASMANEX 60 METERED DOSES) 220 MCG/INH inhaler Inhale 2 puffs into the lungs 2 (two) times daily.  . Multiple Vitamin (MULTIVITAMIN WITH MINERALS) TABS tablet Take 1 tablet by mouth daily.  . Omega-3 Fatty Acids (FISH OIL) 1000 MG CPDR Take by mouth 2 (two) times daily.  . ondansetron (ZOFRAN) 4 MG tablet Take 1 tablet (4 mg total) by mouth every 8 (eight) hours as needed for nausea or vomiting.  . predniSONE (DELTASONE) 10 MG tablet Take '40mg'$  x 3 days,'20mg'$  x 3 days, '10mg'$  x 3 days  . ranitidine (ZANTAC) 75 MG tablet Take 75 mg by mouth 2 (two) times daily.  . vitamin B-12 (CYANOCOBALAMIN) 1000 MCG tablet Take 1,000 mcg by mouth every morning.   . vitamin C (ASCORBIC ACID) 500 MG tablet Take 500 mg by mouth 2 (two) times daily.  . vitamin E 400 UNIT capsule Take 400 Units by  mouth every morning.    No facility-administered encounter medications on file as of 11/01/2017.     ALLERGIES:  Allergies  Allergen Reactions  . Contrast Media [Iodinated Diagnostic  Agents]     Single functioning kidney  . Other Hives    Neoprene fabric Strawberry   . Ether Rash    Took a long time to wake up Doesn't wake up from it     PHYSICAL EXAM:  ECOG Performance status: 1-2 - Symptomatic; requires occasional assistance  Vitals:   11/01/17 1031  BP: (!) 122/51  Pulse: 61  Resp: 18  Temp: (!) 97.5 F (36.4 C)  SpO2: 94%   Filed Weights   11/01/17 1031  Weight: 232 lb (105.2 kg)    Physical Exam  Constitutional: He is oriented to person, place, and time and well-developed, well-nourished, and in no distress.  HENT:  Hearing deficit; wife helps communicate with patient using sign language intermittently. No speech impairment  Eyes: Conjunctivae are normal. Pupils are equal, round, and reactive to light. No scleral icterus.  Neck: Normal range of motion. Neck supple.  Cardiovascular: Normal rate and regular rhythm.  Pulmonary/Chest: Effort normal. No respiratory distress.  Diminished breath sounds bilat bases  Abdominal: Soft. Bowel sounds are normal. There is no tenderness.  Musculoskeletal: He exhibits edema (Trace ankle edema bilat ).  Ambulates with cane; able to get onto exam table without assistance   Lymphadenopathy:       Head (right side): No submandibular and no tonsillar adenopathy present.       Head (left side): No submandibular and no tonsillar adenopathy present.    He has no cervical adenopathy.    He has no axillary adenopathy.       Right: No inguinal and no supraclavicular adenopathy present.       Left: No inguinal and no supraclavicular adenopathy present.  Neurological: He is alert and oriented to person, place, and time.  -Right facial numbness (chronic since stroke)  Skin: Skin is warm and dry. No rash noted.  Psychiatric: Mood, memory, affect and judgment normal.  Nursing note and vitals reviewed.    LABORATORY DATA:  I have reviewed the labs as listed.  CBC    Component Value Date/Time   WBC 87.8 (HH)  11/01/2017 0949   RBC 4.47 11/01/2017 0949   HGB 13.0 11/01/2017 0949   HCT 43.0 11/01/2017 0949   PLT 152 11/01/2017 0949   MCV 96.2 11/01/2017 0949   MCH 29.1 11/01/2017 0949   MCHC 30.2 11/01/2017 0949   RDW 14.6 11/01/2017 0949   LYMPHSABS 81.0 (H) 11/01/2017 0949   MONOABS 1.8 (H) 11/01/2017 0949   EOSABS 0.7 11/01/2017 0949   BASOSABS 0.2 (H) 11/01/2017 0949   CMP Latest Ref Rng & Units 11/01/2017 06/30/2017 04/07/2017  Glucose 65 - 99 mg/dL 138(H) 134(H) 132(H)  BUN 6 - 20 mg/dL 24(H) 21(H) 21(H)  Creatinine 0.61 - 1.24 mg/dL 1.62(H) 1.58(H) 1.51(H)  Sodium 135 - 145 mmol/L 135 136 136  Potassium 3.5 - 5.1 mmol/L 4.1 4.3 4.5  Chloride 101 - 111 mmol/L 102 106 106  CO2 22 - 32 mmol/L _0 Calcium 8.9 - 10.3 mg/dL 9.3 8.8(L) 9.1  Total Protein 6.5 - 8.1 g/dL 6.5 6.3(L) 6.4(L)  Total Bilirubin 0.3 - 1.2 mg/dL 0.6 0.9 0.6  Alkaline Phos 38 - 126 U/L 54 33(L) 38  AST 15 - 41 U/L _1 ALT 17 - 63 U/L 12(L) 14(L) 23  Results for VANNA, SHAVERS (MRN [id number])  Ref. Range 11/01/2017 09:49  LDH Latest Ref Range: 98 - 192 U/L 116    Results for FENIX, RORKE (MRN [id number])   Ref. Range 06/30/2017 10:34  IgG (Immunoglobin G), Serum Latest Ref Range: 700 - 1,600 mg/dL 415 (L)  IgA Latest Ref Range: 61 - 437 mg/dL 25 (L)  IgM, Serum Latest Ref Range: 20 - 172 mg/dL <5 (L)    PENDING LABS:  Immunoglobulins pending      DIAGNOSTIC IMAGING:  MRI Brain: 03/09/17     PATHOLOGY:  Path smear review: 11/10/16         ASSESSMENT & PLAN:   Chronic lymphocytic leukemia/SLL:  -WBCs remain elevated and are 87.8 today. Hgb remains normal, but is lower than previous at 13 g/dL today.  Platelets are improved and actually normal today at 152,000.   -Difficult to assess weight loss as contributing clinical factor of worsening CLL, as he is intentionally trying to lose weight to further reduce his risk of additional cardiovascular event (better control of HTN  and decrease risk of additional stroke).  Commended his weight loss efforts. -No palpable adenopathy on exam.  Continues to have intermittent fatigue, which he feels may be a bit worse but is largely stable. He is still able to performs ADLs and has good quality of life with family.  He has been having some diaphoresis; we discussed that this can mean slight progression of disease over time, but may also be multifactorial given his dyspnea on exertion from his COPD and his occasional anxiety causing diaphoresis at times.  Therefore, it is again difficult to determine his clinical symptoms as evidence of progression of disease.  -Reviewed NCCN Guidelines for treatment for CLL with patient and his wife.  They understand that we consider both the lab values and patient's symptoms before we initiate treatment.  Discussed with Dr. Talbert Cage.  Based on his lab values and overall relatively stable symptoms, we will continue to monitor his labs for now.  I did discuss with patient that the likely first choice for treatment would be Ibrutinib oral chemotherapy vs IV biologic/chemotherapy.  They are worried about the potential financial impact these medications will have for them. Provided reassurance that when the time comes, we will assist them as much as possible to find the most affordable option with their insurance and assistance programs if they qualify.  -Return for labs only in 2 months.  -Return to cancer center in 4 months for follow-up with labs.  Of course, if patient experiences worsening clinical symptoms and/or has questions/concerns, we are happy to see him sooner if needed.    NCCN guidelines for indication for treatment of CLL are:  A. Eligible for clinical trial  B. Significant disease-related symptoms   1. Fatigue (severe)   2. Night sweats   3. Weight loss   4. Fever without infection  C. Threatened end-organ function  D. Progressive bulky disease (spleen >6cm below costal margin, lymph nodes  >10 cm)  E. Progressive anemia  F. Progressive thrombocytopenia.      Hypogammaglobulinemia:  -Historically treated with several doses of IVIG.  IVIG infusions were stopped d/t cost. Recent sinus infection in 09/2017 that resolved with antibiotics.  His immunoglobulin levels remain quite low.  Lab results for today are pending.    History of kidney cancer and elevated BUN/CRE:  -History of kidney cancer s/p nephrectomy.  -BUN/CRE chronically elevated, but stable; 24/1.62 today. EGFR 42 indicating stage  3 CKD.  -Patient should not receive contrast for scans given CKD and previous nephrectomy.  -Encouraged him to drink more non-caffeinated fluids as much as tolerated. Maintain follow-up with PCP/other specialists to maintain good control of his blood pressure and blood sugar to reduce risk of worsening kidney function over time.        Dispo:  -Labs only every 2 months (standing orders for CBC with diff) -Return to cancer center in 4 months with labs (CBC with diff, CMET, LDH, and IgG/IgA/IgM orders placed).     All questions were answered to patient's stated satisfaction. Encouraged patient to call with any new concerns or questions before his next visit to the cancer center and we can certain see him sooner, if needed.    Plan of care discussed with Dr. Talbert Cage, who agrees with the above aforementioned.    A total of 30 minutes was spent in face-to-face care of this patient, with greater than 50% of that time spent in counseling and care-coordination.     Orders placed this encounter:  Orders Placed This Encounter  Procedures  . CBC with Differential/Platelet  . Comprehensive metabolic panel  . IgG, IgA, IgM  . Lactate dehydrogenase      Mike Craze, NP [place] [phone]

## 2017-11-01 ENCOUNTER — Encounter (HOSPITAL_BASED_OUTPATIENT_CLINIC_OR_DEPARTMENT_OTHER): Payer: Medicare Other | Admitting: Adult Health

## 2017-11-01 ENCOUNTER — Other Ambulatory Visit: Payer: Self-pay

## 2017-11-01 ENCOUNTER — Encounter (HOSPITAL_COMMUNITY): Payer: Self-pay | Admitting: Adult Health

## 2017-11-01 ENCOUNTER — Encounter (HOSPITAL_COMMUNITY): Payer: Medicare Other | Attending: Oncology

## 2017-11-01 VITALS — BP 122/51 | HR 61 | Temp 97.5°F | Resp 18 | Ht 66.0 in | Wt 232.0 lb

## 2017-11-01 DIAGNOSIS — J449 Chronic obstructive pulmonary disease, unspecified: Secondary | ICD-10-CM | POA: Diagnosis not present

## 2017-11-01 DIAGNOSIS — Z801 Family history of malignant neoplasm of trachea, bronchus and lung: Secondary | ICD-10-CM | POA: Insufficient documentation

## 2017-11-01 DIAGNOSIS — E039 Hypothyroidism, unspecified: Secondary | ICD-10-CM | POA: Diagnosis not present

## 2017-11-01 DIAGNOSIS — Z79890 Hormone replacement therapy (postmenopausal): Secondary | ICD-10-CM | POA: Insufficient documentation

## 2017-11-01 DIAGNOSIS — Z87891 Personal history of nicotine dependence: Secondary | ICD-10-CM | POA: Diagnosis not present

## 2017-11-01 DIAGNOSIS — N183 Chronic kidney disease, stage 3 unspecified: Secondary | ICD-10-CM

## 2017-11-01 DIAGNOSIS — Z7902 Long term (current) use of antithrombotics/antiplatelets: Secondary | ICD-10-CM | POA: Diagnosis not present

## 2017-11-01 DIAGNOSIS — Z833 Family history of diabetes mellitus: Secondary | ICD-10-CM | POA: Diagnosis not present

## 2017-11-01 DIAGNOSIS — C9110 Chronic lymphocytic leukemia of B-cell type not having achieved remission: Secondary | ICD-10-CM | POA: Diagnosis not present

## 2017-11-01 DIAGNOSIS — Z9049 Acquired absence of other specified parts of digestive tract: Secondary | ICD-10-CM | POA: Insufficient documentation

## 2017-11-01 DIAGNOSIS — K219 Gastro-esophageal reflux disease without esophagitis: Secondary | ICD-10-CM | POA: Insufficient documentation

## 2017-11-01 DIAGNOSIS — Z91041 Radiographic dye allergy status: Secondary | ICD-10-CM | POA: Insufficient documentation

## 2017-11-01 DIAGNOSIS — Z85528 Personal history of other malignant neoplasm of kidney: Secondary | ICD-10-CM | POA: Diagnosis not present

## 2017-11-01 DIAGNOSIS — Z905 Acquired absence of kidney: Secondary | ICD-10-CM | POA: Insufficient documentation

## 2017-11-01 DIAGNOSIS — I129 Hypertensive chronic kidney disease with stage 1 through stage 4 chronic kidney disease, or unspecified chronic kidney disease: Secondary | ICD-10-CM | POA: Insufficient documentation

## 2017-11-01 DIAGNOSIS — Z79899 Other long term (current) drug therapy: Secondary | ICD-10-CM | POA: Diagnosis not present

## 2017-11-01 DIAGNOSIS — H9190 Unspecified hearing loss, unspecified ear: Secondary | ICD-10-CM | POA: Insufficient documentation

## 2017-11-01 DIAGNOSIS — D801 Nonfamilial hypogammaglobulinemia: Secondary | ICD-10-CM | POA: Diagnosis not present

## 2017-11-01 DIAGNOSIS — M1990 Unspecified osteoarthritis, unspecified site: Secondary | ICD-10-CM | POA: Diagnosis not present

## 2017-11-01 DIAGNOSIS — Z8673 Personal history of transient ischemic attack (TIA), and cerebral infarction without residual deficits: Secondary | ICD-10-CM | POA: Diagnosis not present

## 2017-11-01 LAB — COMPREHENSIVE METABOLIC PANEL
ALK PHOS: 54 U/L (ref 38–126)
ALT: 12 U/L — AB (ref 17–63)
AST: 15 U/L (ref 15–41)
Albumin: 4.2 g/dL (ref 3.5–5.0)
Anion gap: 8 (ref 5–15)
BILIRUBIN TOTAL: 0.6 mg/dL (ref 0.3–1.2)
BUN: 24 mg/dL — AB (ref 6–20)
CO2: 25 mmol/L (ref 22–32)
Calcium: 9.3 mg/dL (ref 8.9–10.3)
Chloride: 102 mmol/L (ref 101–111)
Creatinine, Ser: 1.62 mg/dL — ABNORMAL HIGH (ref 0.61–1.24)
GFR, EST AFRICAN AMERICAN: 49 mL/min — AB (ref >60)
GFR, EST NON AFRICAN AMERICAN: 42 mL/min — AB (ref >60)
GLUCOSE: 138 mg/dL — AB (ref 65–99)
POTASSIUM: 4.1 mmol/L (ref 3.5–5.1)
Sodium: 135 mmol/L (ref 135–145)
TOTAL PROTEIN: 6.5 g/dL (ref 6.5–8.1)

## 2017-11-01 LAB — CBC WITH DIFFERENTIAL/PLATELET
Basophils Absolute: 0.2 10*3/uL — ABNORMAL HIGH (ref 0.0–0.1)
Basophils Relative: 0 %
EOS ABS: 0.7 10*3/uL (ref 0.0–0.7)
Eosinophils Relative: 1 %
HCT: 43 % (ref 39.0–52.0)
HEMOGLOBIN: 13 g/dL (ref 13.0–17.0)
Lymphocytes Relative: 92 %
Lymphs Abs: 81 10*3/uL — ABNORMAL HIGH (ref 0.7–4.0)
MCH: 29.1 pg (ref 26.0–34.0)
MCHC: 30.2 g/dL (ref 30.0–36.0)
MCV: 96.2 fL (ref 78.0–100.0)
MONO ABS: 1.8 10*3/uL — AB (ref 0.1–1.0)
MONOS PCT: 2 %
NEUTROS ABS: 4.1 10*3/uL (ref 1.7–7.7)
NEUTROS PCT: 5 %
Platelets: 152 10*3/uL (ref 150–400)
RBC: 4.47 MIL/uL (ref 4.22–5.81)
RDW: 14.6 % (ref 11.5–15.5)
WBC: 87.8 10*3/uL (ref 4.0–10.5)

## 2017-11-01 LAB — LACTATE DEHYDROGENASE: LDH: 116 U/L (ref 98–192)

## 2017-11-01 NOTE — Progress Notes (Signed)
CRITICAL VALUE ALERT Critical value received:  WBC- 87.8 Date of notification:  11/01/17 Time of notification: 3329 Critical value read back:  Yes.   Nurse who received alert:  M.Thomasina Housley,LPN MD notified (1st page):  G.Dawson, NP

## 2017-11-01 NOTE — Patient Instructions (Signed)
[place] at [facility] Discharge Instructions  RECOMMENDATIONS MADE BY THE CONSULTANT AND ANY TEST RESULTS WILL BE SENT TO YOUR REFERRING PHYSICIAN.  You were seen today by Mike Craze NP. Return in 2 months for labs. Return in 4 months for labs and follow up.   Thank you for choosing [facility] at [facility] to provide your oncology and hematology care.  To afford each patient quality time with our provider, please arrive at least 15 minutes before your scheduled appointment time.    If you have a lab appointment with the [facility] please come in thru the  Main Entrance and check in at the main information desk  You need to re-schedule your appointment should you arrive 10 or more minutes late.  We strive to give you quality time with our providers, and arriving late affects you and other patients whose appointments are after yours.  Also, if you no show three or more times for appointments you may be dismissed from the clinic at the providers discretion.     Again, thank you for choosing [facility].  Our hope is that these requests will decrease the amount of time that you wait before being seen by our physicians.       _____________________________________________________________  Should you have questions after your visit to [facility], please contact our office at [phone] between the hours of 8:30 a.m. and 4:30 p.m.  Voicemails left after 4:30 p.m. will not be returned until the following business day.  For prescription refill requests, have your pharmacy contact our office.       Resources For Cancer Patients and their Caregivers ? American Cancer Society: Can assist with transportation, wigs, general needs, runs Look Good Feel Better.        ([phone] 066-6879 ? Cancer Care: Provides financial assistance, online support groups, medication/co-pay assistance.  1-800-813-HOPE 234-147-7122) ? [facility] Assists [place] cancer patients and their families through emotional , educational and financial support.  251-449-8743 ? [place] DSS Where to apply for food stamps, Medicaid and utility assistance. 442 776 8516 ? [facility]: Transportation to medical appointments. 8571497903 ? Social Security Administration: May apply for disability if have a Stage IV cancer. (209)411-1930 309 250 9597 ? [employer], Disability and Transit Services: Assists with nutrition, care and transit needs. [place] Support Programs: @10RELATIVEDAYS @ > Cancer Support Group  2nd Tuesday of the month 1pm-2pm, Journey Room  > Creative Journey  3rd Tuesday of the month 1130am-1pm, Journey Room  > Look Good Feel Better  1st Wednesday of the month 10am-12 noon, Journey Room (Call [facility] to register [phone])

## 2017-11-02 ENCOUNTER — Encounter (HOSPITAL_COMMUNITY): Payer: Self-pay | Admitting: Adult Health

## 2017-11-02 LAB — IGG, IGA, IGM
IGA: 28 mg/dL — AB (ref 61–437)
IGG (IMMUNOGLOBIN G), SERUM: 446 mg/dL — AB (ref 700–1600)

## 2017-11-07 DIAGNOSIS — G4733 Obstructive sleep apnea (adult) (pediatric): Secondary | ICD-10-CM | POA: Diagnosis not present

## 2017-11-22 ENCOUNTER — Encounter: Payer: Self-pay | Admitting: Family Medicine

## 2017-11-23 ENCOUNTER — Other Ambulatory Visit: Payer: Self-pay | Admitting: Family Medicine

## 2017-11-23 MED ORDER — HYDROCODONE-ACETAMINOPHEN 10-325 MG PO TABS: 1.0000 | ORAL_TABLET | Freq: Three times a day (TID) | ORAL | 0 refills | Status: DC | PRN

## 2017-12-09 IMAGING — MR MR MRA HEAD W/O CM
1 series · 16 of 48 positions shown · non-contrast
Comparison: Head CT 03/08/2017

CLINICAL DATA: Headache with right facial and right upper extremity
numbness.

EXAM:
MRI HEAD WITHOUT CONTRAST
MRA HEAD WITHOUT CONTRAST
TECHNIQUE: Multiplanar, multiecho pulse sequences of the brain and surrounding
structures were obtained without intravenous contrast. Angiographic
images of the head were obtained using MRA technique without
contrast.

[Series 3: MRA · axial · 0.8mm · 0.38mm/px · z∈[-39,+57]mm · 16 of 131 slices shown]
[im 1/131]
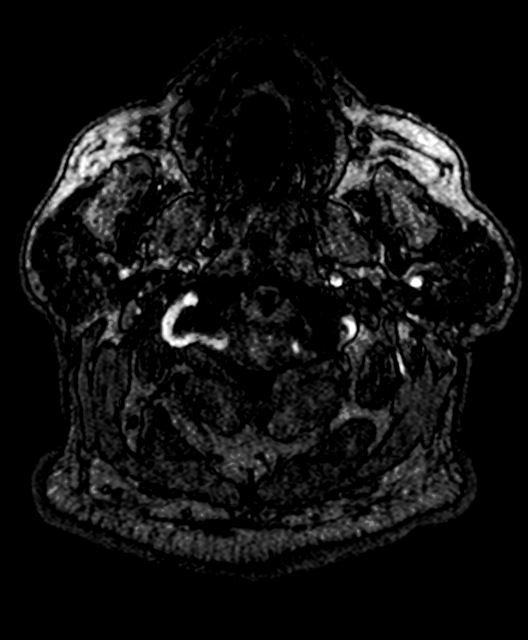
[im 3/131]
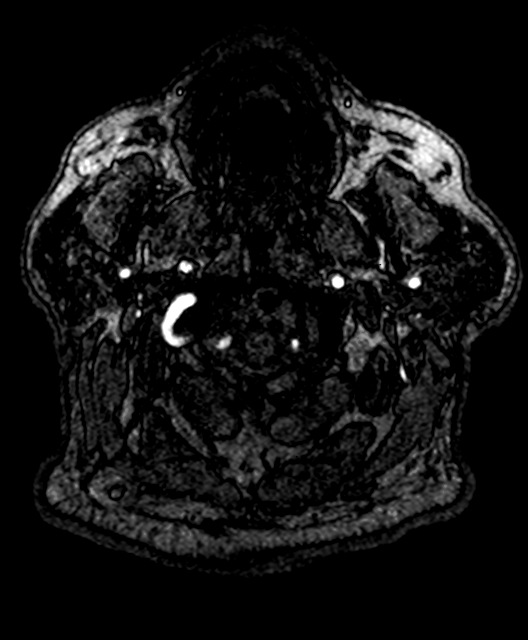
[im 6/131]
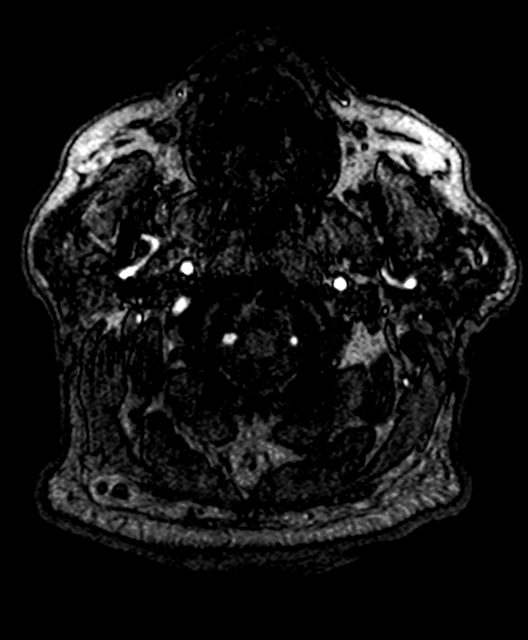
[im 9/131]
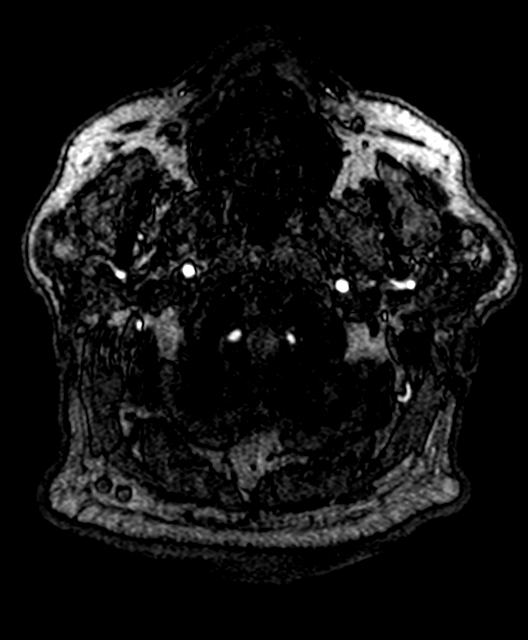
[im 12/131]
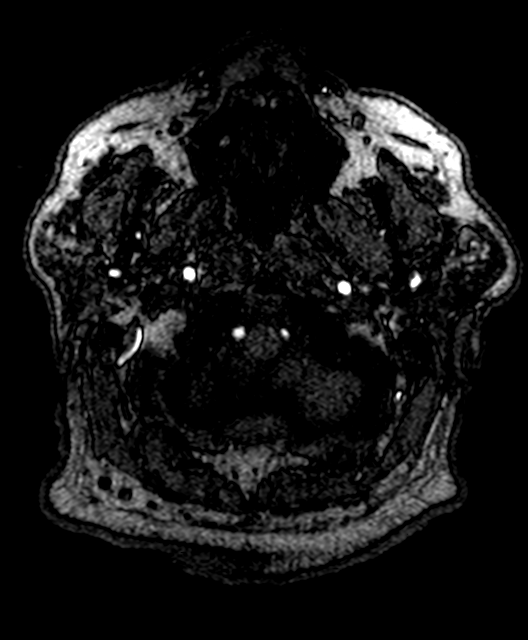
[im 14/131]
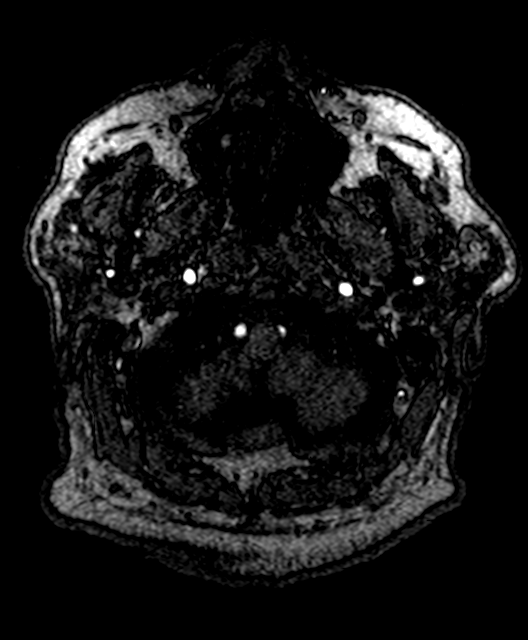
[im 23/131]
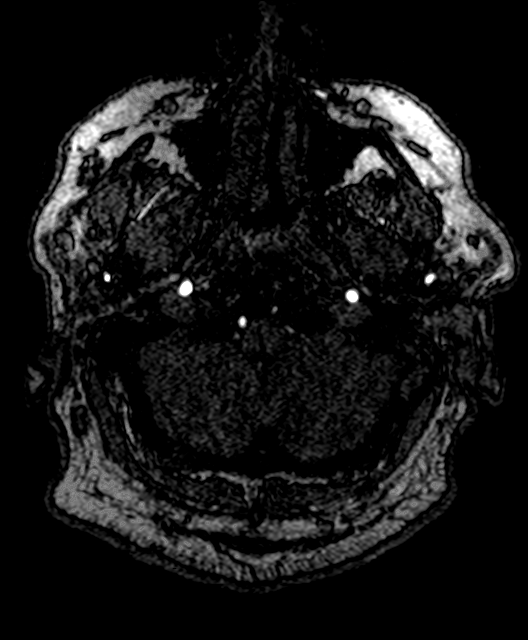
[im 25/131]
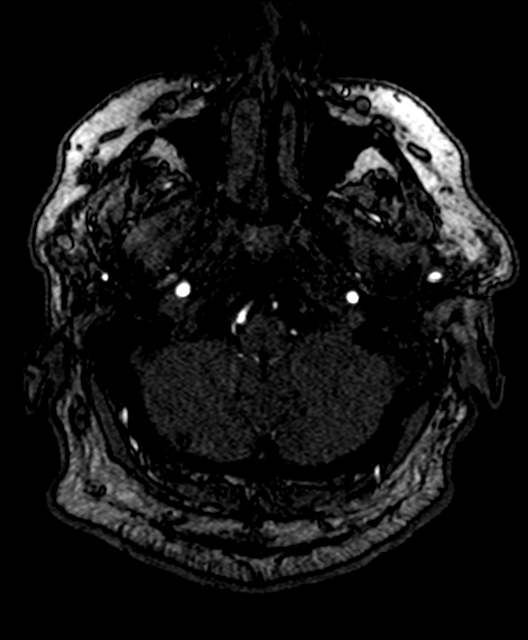
[im 42/131]
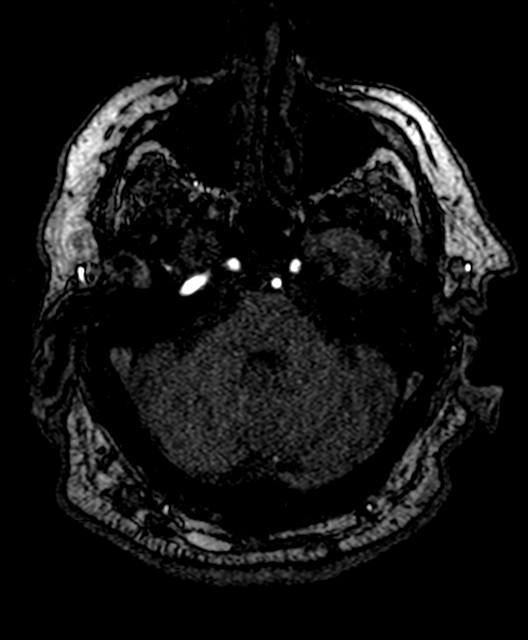
[im 59/131]
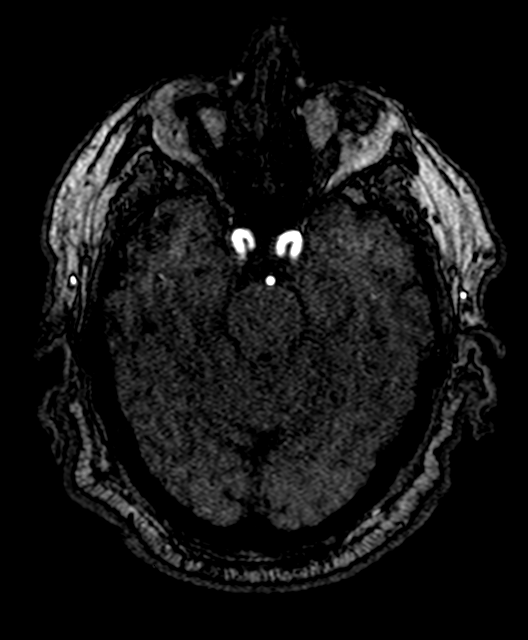
[im 67/131]
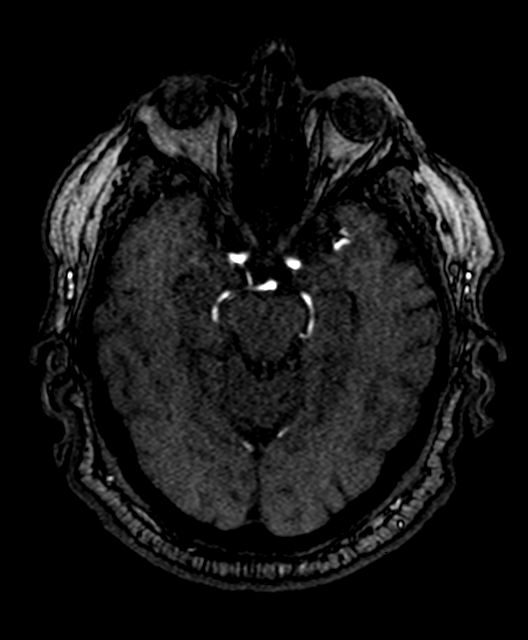
[im 75/131]
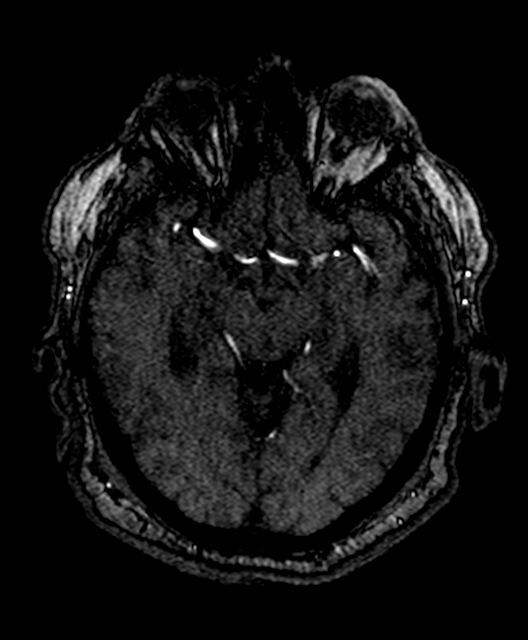
[im 92/131]
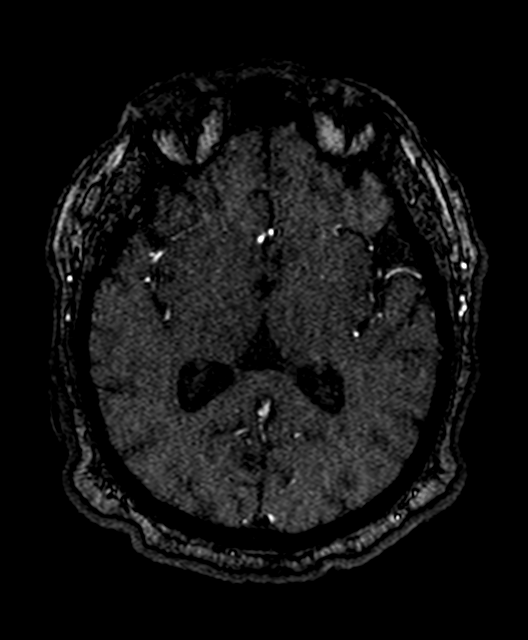
[im 108/131]
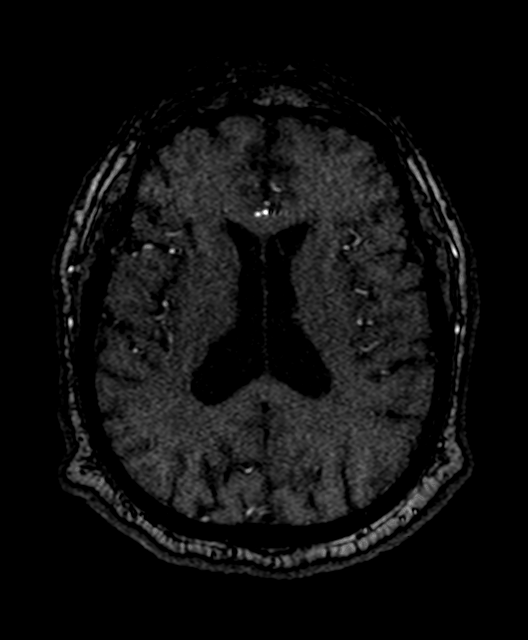
[im 111/131]
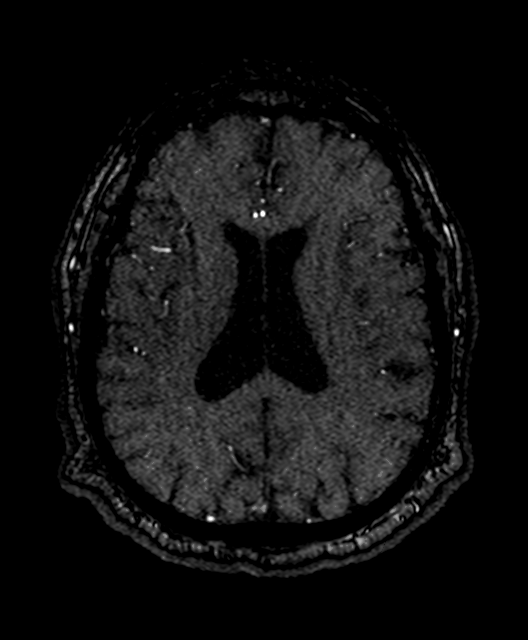
[im 125/131]
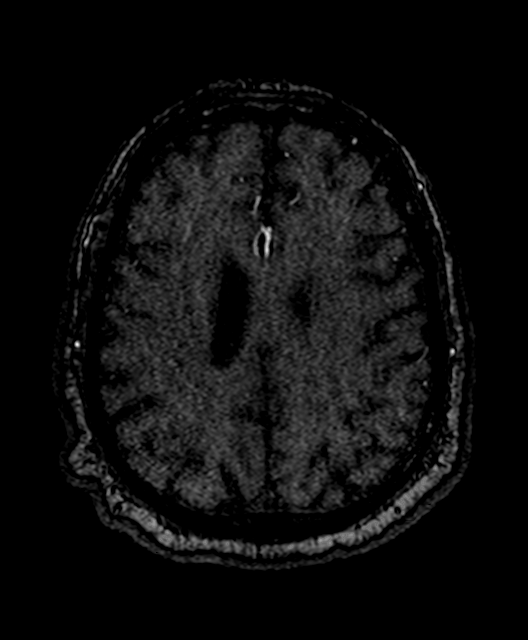

[16 of 48 positions shown; findings below may reference images not displayed]

FINDINGS: MRI HEAD FINDINGS

Brain: There is a 1.1 cm acute infarct in the left thalamus. There
is no evidence of intracranial hemorrhage, mass, midline shift, or
extra-axial fluid collection. The ventricles and sulci are within
normal limits for age. Periventricular white matter T2
hyperintensities are nonspecific but compatible with mild chronic
small vessel ischemic disease.

Vascular: Major intracranial vascular flow voids are preserved.

Skull and upper cervical spine: No suspicious marrow lesion.
Incidental arachnoid granulation in the occipital bone.

Sinuses/Orbits: Unremarkable orbits. Clear paranasal sinuses. Left
mastoid effusion.

Other: None.

MRA HEAD FINDINGS

The visualized distal vertebral arteries are patent to the basilar
with the right being dominant. There is mild narrowing of the left
V4 segment distal to the PICA origin. Patent PICA and SCA origins
are identified bilaterally. The basilar artery is widely patent.
There is a patent right posterior communicating artery. PCAs are
patent without evidence of significant proximal stenosis.

The internal carotid arteries are widely patent from skullbase to
carotid termini. ACAs and MCAs are patent without evidence of major
branch occlusion or significant proximal stenosis. MCA branch vessel
evaluation is mildly limited by motion artifact. No intracranial
aneurysm is identified.
IMPRESSION: 1. Acute left thalamic infarct.
2. Mild chronic small vessel ischemic disease.
3. No large vessel occlusion. Mild stenosis of the nondominant
distal left vertebral artery.

## 2017-12-26 ENCOUNTER — Encounter: Payer: Self-pay | Admitting: Family Medicine

## 2017-12-26 ENCOUNTER — Other Ambulatory Visit: Payer: Self-pay | Admitting: Family Medicine

## 2017-12-26 MED ORDER — HYDROCODONE-ACETAMINOPHEN 10-325 MG PO TABS: 1.0000 | ORAL_TABLET | Freq: Three times a day (TID) | ORAL | 0 refills | Status: DC | PRN

## 2017-12-26 NOTE — Telephone Encounter (Signed)
Ok to refill??  Last office visit 09/13/2017.  Last refill 11/23/2017.

## 2018-01-03 ENCOUNTER — Inpatient Hospital Stay (HOSPITAL_COMMUNITY): Payer: Medicare Other | Attending: Oncology

## 2018-01-03 DIAGNOSIS — F064 Anxiety disorder due to known physiological condition: Secondary | ICD-10-CM | POA: Diagnosis not present

## 2018-01-03 DIAGNOSIS — C9110 Chronic lymphocytic leukemia of B-cell type not having achieved remission: Secondary | ICD-10-CM

## 2018-01-03 DIAGNOSIS — R51 Headache: Secondary | ICD-10-CM | POA: Insufficient documentation

## 2018-01-03 DIAGNOSIS — R748 Abnormal levels of other serum enzymes: Secondary | ICD-10-CM | POA: Insufficient documentation

## 2018-01-03 DIAGNOSIS — N189 Chronic kidney disease, unspecified: Secondary | ICD-10-CM | POA: Insufficient documentation

## 2018-01-03 DIAGNOSIS — Z85528 Personal history of other malignant neoplasm of kidney: Secondary | ICD-10-CM

## 2018-01-03 DIAGNOSIS — D696 Thrombocytopenia, unspecified: Secondary | ICD-10-CM | POA: Diagnosis not present

## 2018-01-03 LAB — CBC WITH DIFFERENTIAL/PLATELET
BLASTS: 0 %
Band Neutrophils: 0 %
Basophils Absolute: 0 10*3/uL (ref 0.0–0.1)
Basophils Relative: 0 %
EOS PCT: 1 %
Eosinophils Absolute: 0.9 10*3/uL — ABNORMAL HIGH (ref 0.0–0.7)
HEMATOCRIT: 40.6 % (ref 39.0–52.0)
Hemoglobin: 12.4 g/dL — ABNORMAL LOW (ref 13.0–17.0)
LYMPHS ABS: 91.2 10*3/uL — AB (ref 0.7–4.0)
Lymphocytes Relative: 96 %
MCH: 30 pg (ref 26.0–34.0)
MCHC: 30.5 g/dL (ref 30.0–36.0)
MCV: 98.1 fL (ref 78.0–100.0)
MYELOCYTES: 0 %
Metamyelocytes Relative: 0 %
Monocytes Absolute: 0 10*3/uL — ABNORMAL LOW (ref 0.1–1.0)
Monocytes Relative: 0 %
NEUTROS PCT: 3 %
Neutro Abs: 2.8 10*3/uL (ref 1.7–7.7)
Other: 0 %
Platelets: 119 10*3/uL — ABNORMAL LOW (ref 150–400)
Promyelocytes Absolute: 0 %
RBC: 4.14 MIL/uL — AB (ref 4.22–5.81)
RDW: 14.3 % (ref 11.5–15.5)
WBC: 94.9 10*3/uL — AB (ref 4.0–10.5)
nRBC: 0 /100 WBC

## 2018-01-03 NOTE — Progress Notes (Signed)
CRITICAL VALUE ALERT Critical value received:  WBC 94.9 Date of notification:  01/03/2018 Time of notification: 1200 Critical value read back:  Yes.   Nurse who received alert:  M.Elgin Carn, LPN MD notified (1st page):  G.Dawson, NP  Patient to return to clinic some time this week and follow up with MD to potentially start treatment.

## 2018-01-04 ENCOUNTER — Inpatient Hospital Stay (HOSPITAL_COMMUNITY): Payer: Medicare Other | Admitting: Internal Medicine

## 2018-01-04 ENCOUNTER — Encounter (HOSPITAL_COMMUNITY): Payer: Self-pay | Admitting: Internal Medicine

## 2018-01-04 ENCOUNTER — Inpatient Hospital Stay (HOSPITAL_COMMUNITY): Payer: Medicare Other

## 2018-01-04 DIAGNOSIS — N189 Chronic kidney disease, unspecified: Secondary | ICD-10-CM

## 2018-01-04 DIAGNOSIS — C9110 Chronic lymphocytic leukemia of B-cell type not having achieved remission: Secondary | ICD-10-CM

## 2018-01-04 DIAGNOSIS — D696 Thrombocytopenia, unspecified: Secondary | ICD-10-CM

## 2018-01-04 DIAGNOSIS — R51 Headache: Secondary | ICD-10-CM | POA: Diagnosis not present

## 2018-01-04 DIAGNOSIS — R748 Abnormal levels of other serum enzymes: Secondary | ICD-10-CM | POA: Diagnosis not present

## 2018-01-04 NOTE — Progress Notes (Signed)
Chief complaint: F/u for CLL- stage 0 He has 1 kidney. He is hearing deficit. Reads lips and his wife helped as well with history.  HISTORY OF PRESENT ILLNESS:  (From Dr. Donald Pore last note on 12/08/16)    Interval history:  He is doing well, no fevers, did not take any antibiotics for 6 months despite stopping IVIG He is not having any skin infections he does not have any sweats his weight today is 236.5 pounds, when compared to his last visit in October his weight is down by 6 pounds and he states this has been intentional.   Physical exam shows stable vitals, no palpable lymph nodes in the neck supraclavicular or axillary areas.  Abdomen shows midline vertical well-healed scar from previous abdominal surgery. He is nontender in the belly and no hepatosplenomegaly was noted.  Oral cavity is normal no posterior pharyngeal wall congestion. Extremities are without edema.  CNS alert awake oriented x3 no gross focal neurologic deficits were noted. Review of systems: He denies having any frequent infections he has not required any antibiotics or in the last 6 months.     Result review:  His WBC count today is 95 his hemoglobin is 12 platelets are 119. When compared to his CBC from July 2018 his absolute lymphocyte count has increased from 59-91 nearly doubling. Platelets have fluctuated in the past.    Last IgG G levels were low at 446 in November 2018 IgA was low at 28 IgM was low at less than 5.  His serum creatinine was 1.62 on 11/01/2017 slightly worse than previously in July 2018.    Impression and plan  CLL he remains a stage 0 with no clinical lymphadenopathy or splenomegaly. He has mild fluctuating thrombocytopenia without any bleeding issues he does have superficial bruising that occurs with pressure but is limited to the extremities, no gum or gingiva mucosal bleeding. He does have some decline in hemoglobin but hemoglobin still is at 12.4 and he is asymptomatic from it. I  have looked through the chart and I could not find any fish panel pertaining to CLL prognostication in the past.   Therefore I requested  a repeat blood draw for CLL fish panel to determine his prognostic category.Fish panel would include mantle cell lymphoma panel as well that needs to be ruled out.  Nevertheless, given that the lymphocytes have nearly doubled in less than 6 months, it is very likely that he would require treatment in the near future.   Therefore we will continue to monitor him more closely with a return visit in 2 months and a CBC. Meanwhile he will contact us for any new B symptoms bleeding easy bruising or frequent infections.  Note that the patient had been on IVIG infusions in the past but the co-pay was too expensive for him, therefore he stopped.   Despite the low immunoglobulin levels, he has however done well in the last several months not requiring any antibiotics.  We will continue to monitor him clinically.  He has one kidney and has chronic kidney disease. This will need to be kept in mind in the future before we initiate any therapy.  He did report mild frontal headache this morning and he says it is related to anxiety due to his visit today he was unable to sleep overnight.  Mild his blood pressure is normal he denies any visual problems and he is not had any recent falls.  If his headache does not improve in the next  24 hours he was asked to contact us.   He will continue his follow-up with his primary care physician as usual.    Patient is hard of hearing most of the history and conversation took place with the help of his wife who is in excellent sign language interpreter.

## 2018-01-04 NOTE — Patient Instructions (Signed)
[place] at [facility]  Discharge Instructions:  You saw Dr. Bangladesh today _______________________________________________________________  Thank you for choosing [place] at [facility] to provide your oncology and hematology care.  To afford each patient quality time with our providers, please arrive at least 15 minutes before your scheduled appointment.  You need to re-schedule your appointment if you arrive 10 or more minutes late.  We strive to give you quality time with our providers, and arriving late affects you and other patients whose appointments are after yours.  Also, if you no show three or more times for appointments you may be dismissed from the clinic.  Again, thank you for choosing [place] [place] [place] hope is that these requests will allow you access to exceptional care and in a timely manner. _______________________________________________________________  If you have questions after your visit, please contact our office at [phone] between the hours of 8:30 a.m. and 5:00 p.m. Voicemails left after 4:30 p.m. will not be returned until the following business day. _______________________________________________________________  For prescription refill requests, have your pharmacy contact our office. _______________________________________________________________  Recommendations made by the consultant and any test results will be sent to your referring physician. _______________________________________________________________

## 2018-01-05 ENCOUNTER — Other Ambulatory Visit (HOSPITAL_COMMUNITY): Payer: Self-pay | Admitting: *Deleted

## 2018-01-05 ENCOUNTER — Encounter: Payer: Self-pay | Admitting: Hematology

## 2018-01-05 DIAGNOSIS — N189 Chronic kidney disease, unspecified: Secondary | ICD-10-CM | POA: Diagnosis not present

## 2018-01-05 DIAGNOSIS — R748 Abnormal levels of other serum enzymes: Secondary | ICD-10-CM | POA: Diagnosis not present

## 2018-01-05 DIAGNOSIS — R51 Headache: Secondary | ICD-10-CM | POA: Diagnosis not present

## 2018-01-05 DIAGNOSIS — C9110 Chronic lymphocytic leukemia of B-cell type not having achieved remission: Secondary | ICD-10-CM

## 2018-01-05 DIAGNOSIS — D696 Thrombocytopenia, unspecified: Secondary | ICD-10-CM | POA: Diagnosis not present

## 2018-01-11 DIAGNOSIS — G4733 Obstructive sleep apnea (adult) (pediatric): Secondary | ICD-10-CM | POA: Diagnosis not present

## 2018-01-23 ENCOUNTER — Other Ambulatory Visit: Payer: Self-pay | Admitting: Family Medicine

## 2018-01-23 MED ORDER — HYDROCODONE-ACETAMINOPHEN 10-325 MG PO TABS: 1.0000 | ORAL_TABLET | Freq: Three times a day (TID) | ORAL | 0 refills | Status: DC | PRN

## 2018-01-23 NOTE — Telephone Encounter (Signed)
Last OV 09/13/2017 Last refill  12/26/2017 Okay to refill?

## 2018-02-09 LAB — TISSUE HYBRIDIZATION TO NCBH

## 2018-02-22 ENCOUNTER — Other Ambulatory Visit: Payer: Self-pay | Admitting: Family Medicine

## 2018-02-22 MED ORDER — HYDROCODONE-ACETAMINOPHEN 10-325 MG PO TABS
1.0000 | ORAL_TABLET | Freq: Three times a day (TID) | ORAL | 0 refills | Status: DC | PRN
Start: 2018-02-22 — End: 2018-03-20

## 2018-02-22 NOTE — Telephone Encounter (Signed)
Ok to refill??  Last office visit 09/13/2017.  Last refill 01/23/2018.

## 2018-02-24 ENCOUNTER — Other Ambulatory Visit: Payer: Self-pay | Admitting: Family Medicine

## 2018-02-27 ENCOUNTER — Encounter: Payer: Self-pay | Admitting: Family Medicine

## 2018-02-28 ENCOUNTER — Ambulatory Visit: Payer: Medicare Other | Admitting: Family Medicine

## 2018-02-28 ENCOUNTER — Other Ambulatory Visit: Payer: Self-pay

## 2018-02-28 ENCOUNTER — Encounter: Payer: Self-pay | Admitting: Family Medicine

## 2018-02-28 VITALS — BP 124/60 | HR 64 | Temp 97.8°F | Resp 14 | Ht 66.0 in | Wt 241.0 lb

## 2018-02-28 DIAGNOSIS — L235 Allergic contact dermatitis due to other chemical products: Secondary | ICD-10-CM | POA: Diagnosis not present

## 2018-02-28 MED ORDER — CLOBETASOL PROPIONATE 0.05 % EX CREA: 1.0000 "application " | TOPICAL_CREAM | Freq: Two times a day (BID) | CUTANEOUS | 0 refills | Status: DC

## 2018-02-28 NOTE — Patient Instructions (Signed)
Use clobetasol  F/U as previous

## 2018-02-28 NOTE — Progress Notes (Signed)
   Subjective:    Patient ID: [name] Ball, male    DOB: 10/05/1951, 67 y.o.   MRN: [id number]  Patient presents for Rash (x1 week- irritation to inner L wirst with redness- possile reaction to brace)  Pt here with reaction to left wrist, after using neoprene brace  He broke out with small blisters and scabs about a week ago, then then took the brace off used neosporin and cortisone which helped but burnied  He injured carrying some heavy items he thinks, but wife states also will have random joint pains which is typical for him   During the visit he was having some of his dizzy spells that he typically gets.  He also had some nasal congestion he woke up with that morning.  He took a Dramamine was feeling much better.  Review Of Systems:  GEN- denies fatigue, fever, weight loss,weakness, recent illness HEENT- denies eye drainage, change in vision, nasal discharge, CVS- denies chest pain, palpitations RESP- denies SOB, cough, wheeze ABD- denies N/V, change in stools, abd pain GU- denies dysuria, hematuria, dribbling, incontinence MSK- + joint pain, muscle aches, injury Neuro- denies headache, +dizziness, syncope, seizure activity       Objective:    BP 124/60   Pulse 64   Temp 97.8 F (36.6 C) (Oral)   Resp 14   Ht 5\' 6"  (1.676 m)   Wt 241 lb (109.3 kg)   SpO2 94%   BMI 38.90 kg/m  GEN- NAD, alert and oriented x3 , wife signs for him /reads lips  HEENT- PERRL, EOMI, non injected sclera, pink conjunctiva, MMM, oropharynx clear Neck- Supple,  CVS- RRR, no murmur RESP-CTAB NEURO-CNII-XII in tact MSK- FROM left wrist, hand ,able to grasp  Skin- left wrist- scaley erythematous dermatitis on inner wrist in 2 patches, , no blisters, no drainage Pulses- Radial 2+        Assessment & Plan:      Problem List Items Addressed This Visit    None    Visit Diagnoses    Allergic dermatitis due to other chemical product    -  Primary   use Clobetasol for contact  dermatitis, no weeping or superinfection, given ACE wrap to use for wrist discomfort. dizzy spell improved with dramamine,f/u oncology for labs this week Avoiding oral steroids to not obscure his labs for CLL      Note: This dictation was prepared with Dragon dictation along with smaller phrase technology. Any transcriptional errors that result from this process are unintentional.

## 2018-03-01 ENCOUNTER — Other Ambulatory Visit (HOSPITAL_COMMUNITY): Payer: Medicare Other

## 2018-03-01 ENCOUNTER — Inpatient Hospital Stay (HOSPITAL_COMMUNITY): Payer: Medicare Other

## 2018-03-01 ENCOUNTER — Ambulatory Visit (HOSPITAL_COMMUNITY): Payer: Medicare Other

## 2018-03-01 ENCOUNTER — Other Ambulatory Visit: Payer: Self-pay

## 2018-03-01 ENCOUNTER — Encounter: Payer: Self-pay | Admitting: Family Medicine

## 2018-03-01 ENCOUNTER — Encounter (HOSPITAL_COMMUNITY): Payer: Self-pay | Admitting: Hematology

## 2018-03-01 ENCOUNTER — Inpatient Hospital Stay (HOSPITAL_COMMUNITY): Payer: Medicare Other | Attending: Adult Health | Admitting: Hematology

## 2018-03-01 VITALS — BP 131/56 | HR 61 | Temp 98.0°F | Resp 18 | Wt 240.6 lb

## 2018-03-01 DIAGNOSIS — C9110 Chronic lymphocytic leukemia of B-cell type not having achieved remission: Secondary | ICD-10-CM | POA: Insufficient documentation

## 2018-03-01 DIAGNOSIS — D801 Nonfamilial hypogammaglobulinemia: Secondary | ICD-10-CM

## 2018-03-01 DIAGNOSIS — N183 Chronic kidney disease, stage 3 unspecified: Secondary | ICD-10-CM

## 2018-03-01 DIAGNOSIS — Z85528 Personal history of other malignant neoplasm of kidney: Secondary | ICD-10-CM

## 2018-03-01 LAB — COMPREHENSIVE METABOLIC PANEL
ALBUMIN: 4.2 g/dL (ref 3.5–5.0)
ALT: 11 U/L — ABNORMAL LOW (ref 17–63)
AST: 15 U/L (ref 15–41)
Alkaline Phosphatase: 44 U/L (ref 38–126)
Anion gap: 10 (ref 5–15)
BUN: 26 mg/dL — ABNORMAL HIGH (ref 6–20)
CHLORIDE: 107 mmol/L (ref 101–111)
CO2: 22 mmol/L (ref 22–32)
Calcium: 9.1 mg/dL (ref 8.9–10.3)
Creatinine, Ser: 1.42 mg/dL — ABNORMAL HIGH (ref 0.61–1.24)
GFR calc Af Amer: 58 mL/min — ABNORMAL LOW (ref >60)
GFR calc non Af Amer: 50 mL/min — ABNORMAL LOW (ref >60)
GLUCOSE: 157 mg/dL — AB (ref 65–99)
POTASSIUM: 4.4 mmol/L (ref 3.5–5.1)
SODIUM: 139 mmol/L (ref 135–145)
Total Bilirubin: 0.7 mg/dL (ref 0.3–1.2)
Total Protein: 6.4 g/dL — ABNORMAL LOW (ref 6.5–8.1)

## 2018-03-01 LAB — CBC WITH DIFFERENTIAL/PLATELET
BASOS ABS: 0.2 10*3/uL — AB (ref 0.0–0.1)
Basophils Relative: 0 %
EOS ABS: 0.3 10*3/uL (ref 0.0–0.7)
EOS PCT: 0 %
HCT: 39.7 % (ref 39.0–52.0)
Hemoglobin: 12.3 g/dL — ABNORMAL LOW (ref 13.0–17.0)
Lymphocytes Relative: 97 %
Lymphs Abs: 95.2 10*3/uL (ref 0.7–4.0)
MCH: 30.1 pg (ref 26.0–34.0)
MCHC: 31 g/dL (ref 30.0–36.0)
MCV: 97.1 fL (ref 78.0–100.0)
Monocytes Absolute: 0.9 10*3/uL (ref 0.1–1.0)
Monocytes Relative: 1 %
NEUTROS PCT: 2 %
Neutro Abs: 2.4 10*3/uL (ref 1.7–7.7)
PLATELETS: 116 10*3/uL — AB (ref 150–400)
RBC: 4.09 MIL/uL — ABNORMAL LOW (ref 4.22–5.81)
RDW: 13.9 % (ref 11.5–15.5)
WBC: 99 10*3/uL (ref 4.0–10.5)

## 2018-03-01 LAB — LACTATE DEHYDROGENASE: LDH: 126 U/L (ref 98–192)

## 2018-03-01 NOTE — Progress Notes (Signed)
CRITICAL VALUE ALERT Critical value received:  WBC-99 Date of notification:  03/01/18 Time of notification: 1400 Critical value read back:  Yes.   Nurse who received alert:  M.Shameria Trimarco, LPN MD notified (1st page):  S.Katragadda, MD

## 2018-03-01 NOTE — Progress Notes (Signed)
Patient Care Team: [facility], [place] Nunnery, MD as PCP - General (Family Medicine) Fay Records, MD as Consulting Physician (Cardiology)  DIAGNOSIS:  Encounter Diagnosis  Name Primary?  . CLL (chronic lymphocytic leukemia) ([place]) Yes    CHIEF COMPLIANT: Follow-up of CLL.  INTERVAL HISTORY: [name] Ball is a very pleasant 67 year old white male who is seen in follow-up for CLL.  He is hard at hearing but he reads lips and his wife does sign language.  He denies any fevers, night sweats or weight loss in the last 3-6 months.  No recurrent infections were reported.  No hospitalizations.  No new onset pains.  REVIEW OF SYSTEMS:   Constitutional: Denies fevers, chills or abnormal weight loss Eyes: Denies blurriness of vision Ears, nose, mouth, throat, and face: Denies mucositis or sore throat Respiratory: Denies cough, dyspnea or wheezes Cardiovascular: Denies palpitation, chest discomfort Gastrointestinal:  Denies nausea, heartburn or change in bowel habits Skin: Denies abnormal skin rashes Lymphatics: Denies new lymphadenopathy or easy bruising Neurological:Denies numbness, tingling or new weaknesses Behavioral/Psych: Mood is stable, no new changes  Extremities: No lower extremity edema  I have reviewed the past medical history, past surgical history, social history and family history with the patient and they are unchanged from previous note.  ALLERGIES:  is allergic to contrast media [iodinated diagnostic agents]; other; and ether.  MEDICATIONS:  Current Outpatient Medications  Medication Sig Dispense Refill  . acetaminophen (TYLENOL) 325 MG tablet Take 650 mg by mouth every 6 (six) hours as needed.    Marland Kitchen albuterol (PROVENTIL) (2.5 MG/3ML) 0.083% nebulizer solution TAKE 3MLS(1 VIAL) VIA NEBULIZER EVERY 6 HOURS AS NEEDED FOR WHEEZING OR SHORTNESS OF BREATH 150 mL 0  . allopurinol (ZYLOPRIM) 100 MG tablet TAKE 1 TABLET(100 MG) BY MOUTH AT BEDTIME 90 tablet 0  . amLODipine (NORVASC)  10 MG tablet TAKE 1 TABLET BY MOUTH DAILY 90 tablet 0  . ATROVENT HFA 17 MCG/ACT inhaler INHALE 2 PUFFS BY MOUTH INTO THE LUNGS EVERY 6 HOURS 12.9 g 0  . calcium carbonate (TUMS - DOSED IN MG ELEMENTAL CALCIUM) 500 MG chewable tablet Chew 2-3 tablets by mouth once as needed for indigestion or heartburn. Reported on 11/21/2015    . citalopram (CELEXA) 10 MG tablet TAKE 1 TABLET(10 MG) BY MOUTH DAILY 90 tablet 0  . clobetasol cream (TEMOVATE) 2.20 % Apply 1 application topically 2 (two) times daily. 30 g 0  . clopidogrel (PLAVIX) 75 MG tablet TAKE 1 TABLET(75 MG) BY MOUTH DAILY 90 tablet 0  . diphenhydrAMINE (BENADRYL) 25 MG tablet Take 25 mg by mouth every evening.     . Glucosamine-Chondroit-Vit C-Mn (GLUCOSAMINE 1500 COMPLEX) CAPS Take 1,200 capsules by mouth 2 (two) times daily.    Marland Kitchen HYDROcodone-acetaminophen (NORCO) 10-325 MG tablet Take 1-2 tablets by mouth every 8 (eight) hours as needed. 120 tablet 0  . levalbuterol (XOPENEX HFA) 45 MCG/ACT inhaler Inhale 1-2 puffs into the lungs every 4 (four) hours as needed for wheezing. 1 Inhaler 6  . levothyroxine (SYNTHROID, LEVOTHROID) 50 MCG tablet TAKE 1 TABLET(50 MCG) BY MOUTH DAILY BEFORE BREAKFAST 90 tablet 0  . loratadine (CLARITIN) 10 MG tablet Take 10 mg by mouth every morning.     . metoprolol tartrate (LOPRESSOR) 50 MG tablet TAKE 1 TABLET(50 MG) BY MOUTH TWICE DAILY 180 tablet 0  . mometasone (ASMANEX 60 METERED DOSES) 220 MCG/INH inhaler Inhale 2 puffs into the lungs 2 (two) times daily. 1 Inhaler 11  . Multiple Vitamin (MULTIVITAMIN WITH MINERALS)  TABS tablet Take 1 tablet by mouth daily.    . Omega-3 Fatty Acids (FISH OIL) 1000 MG CPDR Take by mouth 2 (two) times daily.    . ondansetron (ZOFRAN) 4 MG tablet Take 1 tablet (4 mg total) by mouth every 8 (eight) hours as needed for nausea or vomiting. 20 tablet 0  . ranitidine (ZANTAC) 75 MG tablet Take 75 mg by mouth 2 (two) times daily.    . vitamin B-12 (CYANOCOBALAMIN) 1000 MCG tablet  Take 1,000 mcg by mouth every morning.     . vitamin C (ASCORBIC ACID) 500 MG tablet Take 500 mg by mouth 2 (two) times daily.    . vitamin E 400 UNIT capsule Take 400 Units by mouth every morning.      No current facility-administered medications for this visit.     PHYSICAL EXAMINATION: ECOG PERFORMANCE STATUS: 1 - Symptomatic but completely ambulatory  Vitals:   03/01/18 1418  BP: (!) 131/56  Pulse: 61  Resp: 18  Temp: 98 F (36.7 C)  SpO2: 95%   Filed Weights   03/01/18 1418  Weight: 240 lb 9.6 oz (109.1 kg)    GENERAL:alert, no distress and comfortable SKIN: skin color, texture, turgor are normal, no rashes or significant lesions EYES: normal, Conjunctiva are pink and non-injected, sclera clear OROPHARYNX:no mucositis, no erythema and lips, buccal mucosa, and tongue normal  NECK: supple, thyroid normal size, non-tender, without nodularity LYMPH:  no palpable lymphadenopathy in the cervical, axillary or inguinal LUNGS: clear to auscultation and percussion with normal breathing effort HEART: regular rate & rhythm and no murmurs and no lower extremity edema ABDOMEN: No palpable hepatosplenomegaly.   EXTREMITIES: No lower extremity edema   LABORATORY DATA:  I have reviewed the data as listed CMP Latest Ref Rng & Units 03/01/2018 11/01/2017 06/30/2017  Glucose 65 - 99 mg/dL 157(H) 138(H) 134(H)  BUN 6 - 20 mg/dL 26(H) 24(H) 21(H)  Creatinine 0.61 - 1.24 mg/dL 1.42(H) 1.62(H) 1.58(H)  Sodium 135 - 145 mmol/L 139 135 136  Potassium 3.5 - 5.1 mmol/L 4.4 4.1 4.3  Chloride 101 - 111 mmol/L 107 102 106  CO2 22 - 32 mmol/L 22 25 25   Calcium 8.9 - 10.3 mg/dL 9.1 9.3 8.8(L)  Total Protein 6.5 - 8.1 g/dL 6.4(L) 6.5 6.3(L)  Total Bilirubin 0.3 - 1.2 mg/dL 0.7 0.6 0.9  Alkaline Phos 38 - 126 U/L 44 54 33(L)  AST 15 - 41 U/L 15 15 17   ALT 17 - 63 U/L 11(L) 12(L) 14(L)   No results found for: KGM010   Lab Results  Component Value Date   WBC 99.0 (HH) 03/01/2018   HGB 12.3  (L) 03/01/2018   HCT 39.7 03/01/2018   MCV 97.1 03/01/2018   PLT 116 (L) 03/01/2018   NEUTROABS 2.4 03/01/2018    ASSESSMENT & PLAN:  CLL (chronic lymphocytic leukemia) (HCC) 1.  Stage 0 CLL by Rai classification: He does not have any B symptoms including unexplained fevers, night sweats or weight loss in the last 6 months.  He denies any recurrent infections.  His white count is more or less stable from last visit at 99,000.  Hemoglobin is also stable at 12.4 with a platelet count of 116.  Indications for treatment include significant disease related symptoms (severe fatigue, night sweats, weight loss, fever without infections), threatened endorgan function, progressive anemia and thrombocytopenia.  He does not have any indications at this time.  I will watch him closely in 3 months and repeat his  blood work.  He was told to come back sooner should he develop any of those disease related symptoms mentioned above.  As he is a Jehovah witness, I will consider a drop in hemoglobin level of around 11 to initiate treatment.  He cannot receive blood transfusions.  He is okay with receiving erythropoiesis stimulating agents.  2.  Hypogammaglobulinemia: Despite his IgG levels in the 400 range, he does not have any recurrent infections requiring antibiotics.  He was treated with IVIG in February 2017, which was discontinued secondary to high cost.  3.  Stage III CKD: He had a left nephrectomy for renal cell carcinoma in 2005.  He continues to be in remission.  His creatinine is stable.        Derek Jack, MD 03/01/18

## 2018-03-01 NOTE — Patient Instructions (Signed)
[facility] at [facility] Discharge Instructions  You were seen today by Dr. Katragadda    Thank you for choosing [facility] at [facility] to provide your oncology and hematology care.  To afford each patient quality time with our provider, please arrive at least 15 minutes before your scheduled appointment time.   If you have a lab appointment with the Cancer Center please come in thru the  Main Entrance and check in at the main information desk  You need to re-schedule your appointment should you arrive 10 or more minutes late.  We strive to give you quality time with our providers, and arriving late affects you and other patients whose appointments are after yours.  Also, if you no show three or more times for appointments you may be dismissed from the clinic at the providers discretion.     Again, thank you for choosing [facility].  Our hope is that these requests will decrease the amount of time that you wait before being seen by our physicians.       _____________________________________________________________  Should you have questions after your visit to [facility], please contact our office at [phone] between the hours of 8:30 a.m. and 4:30 p.m.  Voicemails left after 4:30 p.m. will not be returned until the following business day.  For prescription refill requests, have your pharmacy contact our office.       Resources For Cancer Patients and their Caregivers ? American Cancer Society: Can assist with transportation, wigs, general needs, runs Look Good Feel Better.        1-888-227-6333 ? Cancer Care: Provides financial assistance, online support groups, medication/co-pay assistance.  1-800-813-HOPE (4673) ? [facility] Assists [place] cancer patients and their families through emotional , educational and financial support.  336-427-4357 ? [place] DSS Where to  apply for food stamps, Medicaid and utility assistance. 336-342-1394 ? [facility]: Transportation to medical appointments. 336-347-2287 ? Social Security Administration: May apply for disability if have a Stage IV cancer. 336-342-7796 1-800-772-1213 ? [place] Aging, Disability and Transit Services: Assists with nutrition, care and transit needs. 336-349-2343  Cancer Center Support Programs:   > Cancer Support Group  2nd Tuesday of the month 1pm-2pm, Journey Room   > Creative Journey  3rd Tuesday of the month 1130am-1pm, Journey Room    

## 2018-03-01 NOTE — Assessment & Plan Note (Signed)
1.  Stage 0 CLL by Rai classification: He does not have any B symptoms including unexplained fevers, night sweats or weight loss in the last 6 months.  He denies any recurrent infections.  His white count is more or less stable from last visit at 99,000.  Hemoglobin is also stable at 12.4 with a platelet count of 116.  Indications for treatment include significant disease related symptoms (severe fatigue, night sweats, weight loss, fever without infections), threatened endorgan function, progressive anemia and thrombocytopenia.  He does not have any indications at this time.  I will watch him closely in 3 months and repeat his blood work.  He was told to come back sooner should he develop any of those disease related symptoms mentioned above.  As he is a Jehovah witness, I will consider a drop in hemoglobin level of around 11 to initiate treatment.  He cannot receive blood transfusions.  He is okay with receiving erythropoiesis stimulating agents.  2.  Hypogammaglobulinemia: Despite his IgG levels in the 400 range, he does not have any recurrent infections requiring antibiotics.  He was treated with IVIG in February 2017, which was discontinued secondary to high cost.  3.  Stage III CKD: He had a left nephrectomy for renal cell carcinoma in 2005.  He continues to be in remission.  His creatinine is stable.

## 2018-03-02 LAB — IGG, IGA, IGM
IGA: 18 mg/dL — AB (ref 61–437)
IgG (Immunoglobin G), Serum: 384 mg/dL — ABNORMAL LOW (ref 700–1600)
IgM (Immunoglobulin M), Srm: <5 mg/dL — ABNORMAL LOW (ref 20–172)

## 2018-03-20 ENCOUNTER — Other Ambulatory Visit: Payer: Self-pay | Admitting: Family Medicine

## 2018-03-20 MED ORDER — HYDROCODONE-ACETAMINOPHEN 10-325 MG PO TABS: 1.0000 | ORAL_TABLET | Freq: Three times a day (TID) | ORAL | 0 refills | Status: DC | PRN

## 2018-03-20 NOTE — Telephone Encounter (Signed)
Ok to refill??  Last office visit 02/28/2018.  Last refill 02/22/2018.

## 2018-04-03 ENCOUNTER — Other Ambulatory Visit: Payer: Self-pay | Admitting: Family Medicine

## 2018-04-04 ENCOUNTER — Ambulatory Visit: Payer: Medicare Other | Admitting: Family Medicine

## 2018-04-04 ENCOUNTER — Encounter: Payer: Self-pay | Admitting: Family Medicine

## 2018-04-04 ENCOUNTER — Other Ambulatory Visit: Payer: Self-pay

## 2018-04-04 ENCOUNTER — Ambulatory Visit (HOSPITAL_COMMUNITY)
Admission: RE | Admit: 2018-04-04 | Discharge: 2018-04-04 | Disposition: A | Discharge status: Home / Self Care | Payer: Medicare Other | Source: Ambulatory Visit | Attending: Family Medicine | Admitting: Family Medicine

## 2018-04-04 VITALS — Temp 98.0°F | Resp 16 | Ht 66.0 in | Wt 232.4 lb

## 2018-04-04 DIAGNOSIS — R1084 Generalized abdominal pain: Secondary | ICD-10-CM | POA: Diagnosis not present

## 2018-04-04 DIAGNOSIS — R51 Headache: Secondary | ICD-10-CM

## 2018-04-04 DIAGNOSIS — N200 Calculus of kidney: Secondary | ICD-10-CM | POA: Insufficient documentation

## 2018-04-04 DIAGNOSIS — J329 Chronic sinusitis, unspecified: Secondary | ICD-10-CM

## 2018-04-04 DIAGNOSIS — Z905 Acquired absence of kidney: Secondary | ICD-10-CM | POA: Diagnosis not present

## 2018-04-04 DIAGNOSIS — R161 Splenomegaly, not elsewhere classified: Secondary | ICD-10-CM | POA: Diagnosis not present

## 2018-04-04 DIAGNOSIS — J029 Acute pharyngitis, unspecified: Secondary | ICD-10-CM

## 2018-04-04 DIAGNOSIS — R591 Generalized enlarged lymph nodes: Secondary | ICD-10-CM | POA: Diagnosis not present

## 2018-04-04 DIAGNOSIS — R519 Headache, unspecified: Secondary | ICD-10-CM

## 2018-04-04 LAB — URINALYSIS, ROUTINE W REFLEX MICROSCOPIC
BACTERIA UA: NONE SEEN /HPF
Bilirubin Urine: NEGATIVE
Glucose, UA: NEGATIVE
Hgb urine dipstick: NEGATIVE
Ketones, ur: NEGATIVE
Leukocytes, UA: NEGATIVE
Nitrite: NEGATIVE
RBC / HPF: NONE SEEN /HPF (ref 0–2)
SQUAMOUS EPITHELIAL / LPF: NONE SEEN /HPF (ref <=5)
Specific Gravity, Urine: 1.025 (ref 1.001–1.03)
WBC UA: NONE SEEN /HPF (ref 0–5)
pH: 5.5 (ref 5.0–8.0)

## 2018-04-04 LAB — CBC
HEMATOCRIT: 35.4 % — AB (ref 38.5–50.0)
Hemoglobin: 10.9 g/dL — ABNORMAL LOW (ref 13.2–17.1)
MCH: 31.5 pg (ref 27.0–33.0)
MCHC: 30.8 g/dL — AB (ref 32.0–36.0)
MCV: 102.3 fL — ABNORMAL HIGH (ref 80.0–100.0)
Platelets: 130 10*3/uL — ABNORMAL LOW (ref 140–400)
RBC: 3.46 10*6/uL — ABNORMAL LOW (ref 4.20–5.80)
RDW: 15 % (ref 11.0–15.0)
WBC: 99.4 10*3/uL — ABNORMAL HIGH (ref 3.8–10.8)

## 2018-04-04 LAB — STREP GROUP A AG, W/REFLEX TO CULT: Streptococcus, Group A Screen (Direct): NOT DETECTED

## 2018-04-04 LAB — BASIC METABOLIC PANEL WITH GFR
BUN/Creatinine Ratio: 15 (calc) (ref 6–22)
BUN: 23 mg/dL (ref 7–25)
CO2: 25 mmol/L (ref 20–32)
Calcium: 8.5 mg/dL — ABNORMAL LOW (ref 8.6–10.3)
Chloride: 111 mmol/L — ABNORMAL HIGH (ref 98–110)
Creat: 1.56 mg/dL — ABNORMAL HIGH (ref 0.70–1.25)
GFR, Est African American: 52 mL/min/{1.73_m2} — ABNORMAL LOW (ref >=60)
GFR, Est Non African American: 45 mL/min/{1.73_m2} — ABNORMAL LOW (ref >=60)
Glucose, Bld: 105 mg/dL — ABNORMAL HIGH (ref 65–99)
Potassium: 5 mmol/L (ref 3.5–5.3)
Sodium: 140 mmol/L (ref 135–146)

## 2018-04-04 LAB — MICROSCOPIC MESSAGE

## 2018-04-04 NOTE — Progress Notes (Signed)
Patient ID: [name] Ball, male    DOB: May 04, 1951, 67 y.o.   MRN: [id number]  PCP: Alycia Rossetti, MD  Chief Complaint  Patient presents with  . Sore Throat    Patient in today for sore throat, headache, abdominal. Onset last wednesday    Subjective:   [name] Ball is a 67 y.o. male, presents to clinic with CC of 6 days of severe, crampy and sharp generalized abdominal pain that is intermittent with associated constant nausea and frequent profuse, explosive watery diarrhea.  Abdominal pain occurs shorty after eating or just prior to BM.  His stomach only feels better when he has an empty stomach.  He has decreased appetite and has been eating soft foods, and not drinking much.  Diarrhea is watery brown, no blood. Pt also has sore throat that began 5 days ago, eased up for a day, then nasal congestion followed by severe right sinus pressure and headache.  He has pain in his right neck with a prominent lymph node.  He is able to swallow liquids and solids, but soft foods feel better.  He denies any change to his baseline cough and sputum (hx of COPD).  He has intermittent sweats, no change from his baseline.  No change to his energy.  The pt and his wife recently went to a church event with a lot of families and children with multiple sick contacts and sx began shortly after.  No fever, SOB, CP.  Pt has sinus HA over right eye, but also severe HA feels like someone is pounding him over the head with a hammer.    Wife states that his coloring and pallor is normal for him, his energy behavior is as well.  He does not usually have a fever she denies any change to his temperature, frequency of sweats.  Patient Active Problem List   Diagnosis Date Noted  . History of CVA (cerebrovascular accident) 03/18/2017  . Complicated migraine 58/52/7782  . COPD (chronic obstructive pulmonary disease) ([place]) 03/09/2017  . Asthma 03/09/2017  . Hypogammaglobulinemia ([place]) 01/27/2016  . CLL  (chronic lymphocytic leukemia) ([place]) 11/10/2015  . Prediabetes 04/08/2015  . Depression 12/10/2014  . Low testosterone 12/10/2014  . Essential hypertension, benign 08/09/2014  . Sleep apnea 08/09/2014  . Onychomycosis 08/09/2014  . Diverticulosis of colon with hemorrhage 02/20/2014  . GI bleed 02/18/2014  . Recurrent ventral incisional hernia 01/21/2014  . Abdominal wall hernia 11/19/2013  . Hand dermatitis 09/18/2013  . Hypothyroidism 07/17/2013  . CKD (chronic kidney disease), stage III ([place]) 07/17/2013  . Obesity 07/17/2013  . Chronic pain syndrome 07/17/2013  . COPD with asthma ([place]) 07/17/2013  . GERD (gastroesophageal reflux disease) 07/17/2013     Prior to Admission medications   Medication Sig Start Date End Date Taking? Authorizing Provider  acetaminophen (TYLENOL) 325 MG tablet Take 650 mg by mouth every 6 (six) hours as needed.   Yes [provider]  albuterol (PROVENTIL) (2.5 MG/3ML) 0.083% nebulizer solution TAKE 3MLS(1 VIAL) VIA NEBULIZER EVERY 6 HOURS AS NEEDED FOR WHEEZING OR SHORTNESS OF BREATH 11/08/16  Yes [place], Modena Nunnery, MD  allopurinol (ZYLOPRIM) 100 MG tablet TAKE 1 TABLET(100 MG) BY MOUTH AT BEDTIME 02/24/18  Yes [place], Modena Nunnery, MD  amLODipine (NORVASC) 10 MG tablet TAKE 1 TABLET BY MOUTH DAILY 02/24/18  Yes [place], Modena Nunnery, MD  ATROVENT HFA 17 MCG/ACT inhaler INHALE 2 PUFFS BY MOUTH INTO THE LUNGS EVERY 6 HOURS 11/08/16  Yes [place], [place],  MD  calcium carbonate (TUMS - DOSED IN MG ELEMENTAL CALCIUM) 500 MG chewable tablet Chew 2-3 tablets by mouth once as needed for indigestion or heartburn. Reported on 11/21/2015   Yes [provider]  citalopram (CELEXA) 10 MG tablet TAKE 1 TABLET(10 MG) BY MOUTH DAILY 01/23/18  Yes [place], Modena Nunnery, MD  clobetasol cream (TEMOVATE) 3.84 % Apply 1 application topically 2 (two) times daily. 02/28/18  Yes [place], Modena Nunnery, MD  clopidogrel (PLAVIX) 75 MG tablet TAKE 1 TABLET(75 MG) BY MOUTH DAILY 04/03/18   Yes [place], Modena Nunnery, MD  diphenhydrAMINE (BENADRYL) 25 MG tablet Take 25 mg by mouth every evening.    Yes [provider]  Glucosamine-Chondroit-Vit C-Mn (GLUCOSAMINE 1500 COMPLEX) CAPS Take 1,200 capsules by mouth 2 (two) times daily.   Yes [provider]  HYDROcodone-acetaminophen (NORCO) 10-325 MG tablet Take 1-2 tablets by mouth every 8 (eight) hours as needed. 03/20/18  Yes [place], Modena Nunnery, MD  levalbuterol [facility] HFA) 45 MCG/ACT inhaler Inhale 1-2 puffs into the lungs every 4 (four) hours as needed for wheezing. 07/22/14  Yes [place], Modena Nunnery, MD  levothyroxine (SYNTHROID, LEVOTHROID) 50 MCG tablet TAKE 1 TABLET(50 MCG) BY MOUTH DAILY BEFORE BREAKFAST 01/23/18  Yes [place], Modena Nunnery, MD  loratadine (CLARITIN) 10 MG tablet Take 10 mg by mouth every morning.    Yes [provider]  metoprolol tartrate (LOPRESSOR) 50 MG tablet TAKE 1 TABLET(50 MG) BY MOUTH TWICE DAILY 02/24/18  Yes [place], Modena Nunnery, MD  mometasone [facility] 60 METERED DOSES) 220 MCG/INH inhaler Inhale 2 puffs into the lungs 2 (two) times daily. 08/08/15  Yes [place], Modena Nunnery, MD  Multiple Vitamin (MULTIVITAMIN WITH MINERALS) TABS tablet Take 1 tablet by mouth daily.   Yes [provider]  Omega-3 Fatty Acids (FISH OIL) 1000 MG CPDR Take by mouth 2 (two) times daily.   Yes [provider]  ondansetron (ZOFRAN) 4 MG tablet Take 1 tablet (4 mg total) by mouth every 8 (eight) hours as needed for nausea or vomiting. 09/13/17  Yes [place], Modena Nunnery, MD  ranitidine (ZANTAC) 75 MG tablet Take 75 mg by mouth 2 (two) times daily.   Yes [provider]  vitamin B-12 (CYANOCOBALAMIN) 1000 MCG tablet Take 1,000 mcg by mouth every morning.    Yes [provider]  vitamin C (ASCORBIC ACID) 500 MG tablet Take 500 mg by mouth 2 (two) times daily.   Yes [provider]  vitamin E 400 UNIT capsule Take 400 Units by mouth every morning.    Yes [provider]      Allergies  Allergen Reactions  . Contrast Media [Iodinated Diagnostic Agents]     Single functioning kidney  . Other Hives    Neoprene fabric Strawberry   . Ether Rash    Took a long time to wake up Doesn't wake up from it     Family History  Problem Relation Age of Onset  . Cancer Mother        lung, met to brain  . Diabetes Mellitus II Sister   . Colon cancer Neg Hx      Social History   Socioeconomic History  . Marital status: Married    Spouse name: Zigmund Daniel  . Number of children: 4  . Years of education: 74  . Highest education level: Not on file  Occupational History    Comment: retired  Scientific laboratory technician  . Financial resource strain: Not on file  . Food insecurity:  Worry: Not on file    Inability: Not on file  . Transportation needs:    Medical: Not on file    Non-medical: Not on file  Tobacco Use  . Smoking status: Former Smoker    Last attempt to quit: 12/06/1996    Years since quitting: 21.3  . Smokeless tobacco: Never Used  Substance and Sexual Activity  . Alcohol use: Yes    Comment: a few shots a few times a month  . Drug use: No  . Sexual activity: Not on file  Lifestyle  . Physical activity:    Days per week: Not on file    Minutes per session: Not on file  . Stress: Not on file  Relationships  . Social connections:    Talks on phone: Not on file    Gets together: Not on file    Attends religious service: Not on file    Active member of club or organization: Not on file    Attends meetings of clubs or organizations: Not on file    Relationship status: Not on file  . Intimate partner violence:    Fear of current or ex partner: Not on file    Emotionally abused: Not on file    Physically abused: Not on file    Forced sexual activity: Not on file  Other Topics Concern  . 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   Lives at home with wife, daughter, family   Caffeine- sodas, 2  daily     Review of Systems  Constitutional: Negative.  Negative for activity change, appetite change, chills, diaphoresis, fatigue, fever and unexpected weight change.  HENT: Positive for congestion, postnasal drip, rhinorrhea, sinus pressure, sinus pain and sore throat. Negative for dental problem, drooling, ear discharge, ear pain, facial swelling, hearing loss, mouth sores, nosebleeds, trouble swallowing and voice change.   Eyes: Negative.  Negative for visual disturbance.  Respiratory: Negative.  Negative for cough, choking, chest tightness, shortness of breath, wheezing and stridor.   Cardiovascular: Negative.  Negative for chest pain, palpitations and leg swelling.  Gastrointestinal: Positive for abdominal pain, diarrhea and nausea. Negative for abdominal distention, anal bleeding, blood in stool, constipation, rectal pain and vomiting.  Endocrine: Negative.   Genitourinary: Negative for decreased urine volume, difficulty urinating, discharge, dysuria, flank pain, frequency, hematuria, penile pain, penile swelling, scrotal swelling, testicular pain and urgency.  Musculoskeletal: Negative.   Skin: Negative.  Negative for color change, pallor and rash.  Allergic/Immunologic: Negative.   Neurological: Positive for headaches. Negative for dizziness, tremors, syncope, facial asymmetry, weakness, light-headedness and numbness.  Hematological: Positive for adenopathy. Does not bruise/bleed easily.  Psychiatric/Behavioral: Negative.   All other systems reviewed and are negative.      Objective:    Vitals:   04/04/18 1414  Resp: 16  Temp: 98 F (36.7 C)  TempSrc: Oral  SpO2: 96%  Weight: 232 lb 6 oz (105.4 kg)  Height: 5' 6" (1.676 m)      Physical Exam  Constitutional: He is oriented to person, place, and time. He appears well-developed and well-nourished.  Non-toxic appearance. He appears ill. No distress.  Obese elderly male, chronically ill appearing, pale, alert, NAD   HENT:  Head: Normocephalic and atraumatic.  Right Ear: Tympanic membrane, external ear and ear canal normal.  Left Ear: Tympanic membrane, external ear and ear canal normal.  Nose: No mucosal edema or rhinorrhea. Right sinus  exhibits no maxillary sinus tenderness and no frontal sinus tenderness. Left sinus exhibits no maxillary sinus tenderness and no frontal sinus tenderness.  Mouth/Throat: Uvula is midline. No trismus in the jaw. No uvula swelling. No oropharyngeal exudate, posterior oropharyngeal edema or posterior oropharyngeal erythema.  Right cervical lymphadenopathy, bilateral cervical lymph nodes enlarged and prominent but right is greater than left and right is tender Nasal congestion, mucosa edematous, erythematous, with moderate discharge OP with postnasal drip, no edema no exudate, uvula midline B/L TM's normal Sinus TTP over right frontal sinus and right maxillary sinus  Eyes: Pupils are equal, round, and reactive to light. Conjunctivae, EOM and lids are normal. Right eye exhibits no discharge. Left eye exhibits no discharge. No scleral icterus.  Neck: Trachea normal, normal range of motion and phonation normal. Neck supple. No tracheal deviation present.  Cardiovascular: Normal rate, regular rhythm, normal heart sounds and normal pulses. Exam reveals no gallop and no friction rub.  No murmur heard. Pulses:      Radial pulses are 2+ on the right side, and 2+ on the left side.       Posterior tibial pulses are 2+ on the right side, and 2+ on the left side.  Pulmonary/Chest: Effort normal and breath sounds normal. No stridor. No respiratory distress. He has no wheezes. He has no rhonchi. He has no rales.  Abdominal: Soft. Normal appearance and bowel sounds are normal. He exhibits no distension. There is tenderness. There is guarding.  Protuberant obese abdomen, NBSx 4 ttp across lower abdomen from RLQ to LLQ with voluntary guarding Right CVA tenderness  Musculoskeletal: Normal  range of motion. He exhibits no edema.  Neurological: He is alert and oriented to person, place, and time. He exhibits normal muscle tone. Coordination and gait normal.  Skin: Skin is warm, dry and intact. Capillary refill takes less than 2 seconds. No rash noted. He is not diaphoretic. There is pallor.  Psychiatric: He has a normal mood and affect. His speech is normal and behavior is normal.  Nursing note and vitals reviewed.    Results for orders placed or performed in visit on 04/04/18 (from the past 24 hour(s))  STREP GROUP A AG, W/REFLEX TO CULT     Status: None   Collection Time: 04/04/18  2:24 PM  Result Value Ref Range   Streptococcus, Group A Screen (Direct) NONE DETECTED   Urinalysis, Routine w reflex microscopic     Status: Abnormal   Collection Time: 04/04/18  3:07 PM  Result Value Ref Range   Color, Urine YELLOW YELLOW   APPearance CLEAR CLEAR   Specific Gravity, Urine 1.025 1.001 - 1.03   pH 5.5 5.0 - 8.0   Glucose, UA NEGATIVE NEGATIVE   Bilirubin Urine NEGATIVE NEGATIVE   Ketones, ur NEGATIVE NEGATIVE   Hgb urine dipstick NEGATIVE NEGATIVE   Protein, ur TRACE (A) NEGATIVE   Nitrite NEGATIVE NEGATIVE   Leukocytes, UA NEGATIVE NEGATIVE   WBC, UA NONE SEEN 0 - 5 /HPF   RBC / HPF NONE SEEN 0 - 2 /HPF   Squamous Epithelial / LPF NONE SEEN < OR = 5 /HPF   Bacteria, UA NONE SEEN NONE SEEN /HPF  Microscopic Message     Status: None   Collection Time: 04/04/18  3:07 PM  Result Value Ref Range   Note    CBC     Status: Abnormal   Collection Time: 04/04/18  5:00 PM  Result Value Ref Range   WBC 99.4 (H) 3.8 -  10.8 Thousand/uL   RBC 3.46 (L) 4.20 - 5.80 Million/uL   Hemoglobin 10.9 (L) 13.2 - 17.1 g/dL   HCT 35.4 (L) 38.5 - 50.0 %   MCV 102.3 (H) 80.0 - 100.0 fL   MCH 31.5 27.0 - 33.0 pg   MCHC 30.8 (L) 32.0 - 36.0 g/dL   RDW 15.0 11.0 - 15.0 %   Platelets 130 (L) 140 - 400 Thousand/uL    UA with trace protein. Orthostatics negative - see chart       Assessment & Plan:      ICD-10-CM   1. Generalized abdominal pain R10.84 Urinalysis, Routine w reflex microscopic    Urine Culture    Gastrointestinal Pathogen Panel PCR    CT Abdomen Pelvis Wo Contrast    CBC with Differential/Platelet    BASIC METABOLIC PANEL WITH GFR    Microscopic Message    CBC    CANCELED: BASIC METABOLIC PANEL WITH GFR    CANCELED: CBC with Differential   with N/D x 6 days Suspect viral gastroenteritis, but with abd TTP in immunocompromised pt Stat CT WBC at baseline BMP pending UA unremarkable  2. Sore throat J02.9 STREP GROUP A AG, W/REFLEX TO CULT    CBC  3. Nonintractable headache, unspecified chronicity pattern, unspecified headache type R51 Orthostatic vital signs    CBC    Sinusitis - will need Abx, but will wait pending abd/pelvis CT and labs. Stool studies ordered, pt unable to provide in clinic.  Pt got 1L of NS in clinic   Delsa Grana, PA-C 04/04/18 3:13 PM   CT results were called to Dr. Buelah Manis last night to reviewed them and spoke with the patient and his wife last night as well.  Please see her documentation for details  Per Dr., will treat sinusitis with Levaquin - Rx'd by Dr. Buelah Manis. Patient's wife brought in a stool sample this morning stool studies are pending. Visit/Ct results will be routed to patient's oncologist, pt to get follow up with med onc with recent abdominal adenopathy changes Remaining labs resulted and were reviewed - see my other documentation

## 2018-04-05 ENCOUNTER — Encounter: Payer: Self-pay | Admitting: Family Medicine

## 2018-04-05 DIAGNOSIS — K529 Noninfective gastroenteritis and colitis, unspecified: Secondary | ICD-10-CM | POA: Diagnosis not present

## 2018-04-05 DIAGNOSIS — R1084 Generalized abdominal pain: Secondary | ICD-10-CM | POA: Diagnosis not present

## 2018-04-05 MED ORDER — LEVOFLOXACIN 500 MG PO TABS: 500.0000 mg | ORAL_TABLET | Freq: Every day | ORAL | 0 refills | Status: DC

## 2018-04-05 NOTE — Progress Notes (Signed)
Discussed CBC with pt while in clinic, WBC at baseline, platelets improved since last month, Hb decreased. Kidney function at baseline. Electrolytes unremarkable Urine not concerning for UTI, trace protein Rapid strep negative and culture pending

## 2018-04-05 NOTE — Telephone Encounter (Signed)
Call placed to patient and patient wife made aware.   States that patient had #2 levaquin at home. Prescription sent to pharmacy.

## 2018-04-06 LAB — GASTROINTESTINAL PATHOGEN PANEL PCR
C. difficile Tox A/B, PCR: NOT DETECTED
CAMPYLOBACTER, PCR: NOT DETECTED
CRYPTOSPORIDIUM, PCR: NOT DETECTED
E COLI 0157, PCR: NOT DETECTED
E coli (ETEC) LT/ST PCR: NOT DETECTED
E coli (STEC) stx1/stx2, PCR: NOT DETECTED
GIARDIA LAMBLIA, PCR: NOT DETECTED
NOROVIRUS, PCR: NOT DETECTED
Rotavirus A, PCR: NOT DETECTED
Salmonella, PCR: NOT DETECTED
Shigella, PCR: NOT DETECTED

## 2018-04-06 LAB — CULTURE, GROUP A STREP
MICRO NUMBER:: 90524928
SPECIMEN QUALITY: ADEQUATE

## 2018-04-06 LAB — URINE CULTURE
MICRO NUMBER:: 90525250
SPECIMEN QUALITY:: ADEQUATE

## 2018-04-06 NOTE — Progress Notes (Signed)
Urine culture was negative. All other labs were near baseline. Please call pt to see if they understand all the results that we have so far, including the abdominal CT scan.  If he is still having pain I can send in some pain meds.  We are just currently treating a sinus infection with levaquin for 7 days, rx sent in.  The stool studies are still pending, we still believe it is a GI virus.  Important to stay hydrated.   Please have them come back in for recheck if they have concerns/pain/new sx.  We do want him to f/up with hemeonc/oncology.

## 2018-04-07 ENCOUNTER — Other Ambulatory Visit: Payer: Self-pay

## 2018-04-07 ENCOUNTER — Encounter: Payer: Self-pay | Admitting: Family Medicine

## 2018-04-07 ENCOUNTER — Ambulatory Visit: Payer: Medicare Other | Admitting: Family Medicine

## 2018-04-07 VITALS — BP 134/64 | HR 62 | Temp 98.0°F | Resp 14 | Ht 66.0 in | Wt 230.0 lb

## 2018-04-07 DIAGNOSIS — J449 Chronic obstructive pulmonary disease, unspecified: Secondary | ICD-10-CM

## 2018-04-07 DIAGNOSIS — I10 Essential (primary) hypertension: Secondary | ICD-10-CM | POA: Diagnosis not present

## 2018-04-07 DIAGNOSIS — N183 Chronic kidney disease, stage 3 unspecified: Secondary | ICD-10-CM

## 2018-04-07 DIAGNOSIS — Z1159 Encounter for screening for other viral diseases: Secondary | ICD-10-CM

## 2018-04-07 DIAGNOSIS — J0100 Acute maxillary sinusitis, unspecified: Secondary | ICD-10-CM | POA: Diagnosis not present

## 2018-04-07 DIAGNOSIS — E038 Other specified hypothyroidism: Secondary | ICD-10-CM | POA: Diagnosis not present

## 2018-04-07 DIAGNOSIS — K529 Noninfective gastroenteritis and colitis, unspecified: Secondary | ICD-10-CM

## 2018-04-07 DIAGNOSIS — R7303 Prediabetes: Secondary | ICD-10-CM | POA: Diagnosis not present

## 2018-04-07 DIAGNOSIS — J42 Unspecified chronic bronchitis: Secondary | ICD-10-CM | POA: Diagnosis not present

## 2018-04-07 NOTE — Progress Notes (Signed)
Stool PCR negative- patient will be notified

## 2018-04-07 NOTE — Patient Instructions (Addendum)
F/U August Physical

## 2018-04-07 NOTE — Progress Notes (Signed)
   Subjective:    Patient ID: [name] Ball, male    DOB: [birth year], 67 y.o.   MRN: [id number]  Patient presents for Follow-up  Pt here for f/u seen earlier this week, for Gastroenteritis, sinusitis, dehydration.   CT did not show any acute abnormality, stool studies negative. His diarrhea has improved has more formed stool  Continues to have sinus pressure, no HA, he is not taking any anti-histamine, does not tolerate nasal rinses, he is taking the levaquin  His labs and imaging were reviewed by his oncologist as well in setting of CLL, his WBC are 99,000. Overall doing better, his appetite is back to normal, had pizza yesterday. He has been intentionally trying to lose weight wants to get closer to 200lbs   Review Of Systems:  GEN- denies fatigue, fever, weight loss,weakness, recent illness HEENT- denies eye drainage, change in vision,+ nasal discharge, CVS- denies chest pain, palpitations RESP- denies SOB, cough, wheeze ABD- denies N/V, change in stools, abd pain GU- denies dysuria, hematuria, dribbling, incontinence MSK- denies joint pain, muscle aches, injury Neuro- denies headache, dizziness, syncope, seizure activity       Objective:    BP 134/64   Pulse 62   Temp 98 F (36.7 C) (Oral)   Resp 14   Ht 5\' 6"  (1.676 m)   Wt 230 lb (104.3 kg)   SpO2 96%   BMI 37.12 kg/m  GEN- NAD, alert and oriented x3 HEENT- PERRL, EOMI, non injected sclera, pink conjunctiva, MMM, oropharynx clear, +right maxillary/frontal tenderness, nares clear rhinorrhea  Neck- Supple, few shotty Nodes CVS- RRR, no murmur RESP-CTAB ABD-NABS,soft,NT,ND EXT- No edema Pulses- Radial  2+        Assessment & Plan:      Problem List Items Addressed This Visit      Unprioritized   Hypothyroidism   Relevant Orders   TSH (Completed)   T3, free (Completed)   T4, free (Completed)   Prediabetes   Relevant Orders   Hemoglobin A1c (Completed)   CKD (chronic kidney disease), stage III (HCC)    Essential hypertension, benign    bp improved now dehydration has resolved Check his A1C and TFT as well No other changes to medication      Relevant Orders   Lipid panel (Completed)   COPD with asthma ([place])    Currently at baseline Complete treatment for sinusitis, add oral anti-histamine       Other Visit Diagnoses    Gastroenteritis    -  Primary   resolved   Relevant Orders   Basic metabolic panel (Completed)   Acute maxillary sinusitis, recurrence not specified       per below complete medications   Chronic bronchitis, unspecified chronic bronchitis type ([place])       Encounter for hepatitis C screening test for low risk patient       Relevant Orders   Hepatitis C antibody      Note: This dictation was prepared with Dragon dictation along with smaller phrase technology. Any transcriptional errors that result from this process are unintentional.

## 2018-04-09 ENCOUNTER — Encounter: Payer: Self-pay | Admitting: Family Medicine

## 2018-04-09 NOTE — Assessment & Plan Note (Signed)
bp improved now dehydration has resolved Check his A1C and TFT as well No other changes to medication

## 2018-04-09 NOTE — Assessment & Plan Note (Signed)
Currently at baseline Complete treatment for sinusitis, add oral anti-histamine

## 2018-04-10 LAB — BASIC METABOLIC PANEL
BUN/Creatinine Ratio: 12 (calc) (ref 6–22)
BUN: 20 mg/dL (ref 7–25)
CALCIUM: 9.7 mg/dL (ref 8.6–10.3)
CO2: 24 mmol/L (ref 20–32)
CREATININE: 1.63 mg/dL — AB (ref 0.70–1.25)
Chloride: 107 mmol/L (ref 98–110)
GLUCOSE: 115 mg/dL — AB (ref 65–99)
Potassium: 5.5 mmol/L — ABNORMAL HIGH (ref 3.5–5.3)
Sodium: 140 mmol/L (ref 135–146)

## 2018-04-10 LAB — LIPID PANEL
Cholesterol: 146 mg/dL (ref <200)
HDL: 19 mg/dL — ABNORMAL LOW (ref >40)
LDL Cholesterol (Calc): 93 mg/dL (calc)
NON-HDL CHOLESTEROL (CALC): 127 mg/dL (ref <130)
Total CHOL/HDL Ratio: 7.7 (calc) — ABNORMAL HIGH (ref <5.0)
Triglycerides: 246 mg/dL — ABNORMAL HIGH (ref <150)

## 2018-04-10 LAB — TSH: TSH: 1.35 mIU/L (ref 0.40–4.50)

## 2018-04-10 LAB — HEMOGLOBIN A1C

## 2018-04-10 LAB — T3, FREE: T3 FREE: 2.6 pg/mL (ref 2.3–4.2)

## 2018-04-10 LAB — T4, FREE: FREE T4: 1.3 ng/dL (ref 0.8–1.8)

## 2018-04-10 LAB — HEPATITIS C ANTIBODY
Hepatitis C Ab: NONREACTIVE
SIGNAL TO CUT-OFF: 0 (ref <1.00)

## 2018-04-11 ENCOUNTER — Other Ambulatory Visit: Payer: Self-pay | Admitting: *Deleted

## 2018-04-11 DIAGNOSIS — N189 Chronic kidney disease, unspecified: Secondary | ICD-10-CM

## 2018-04-11 DIAGNOSIS — E875 Hyperkalemia: Secondary | ICD-10-CM

## 2018-04-13 DIAGNOSIS — G4733 Obstructive sleep apnea (adult) (pediatric): Secondary | ICD-10-CM | POA: Diagnosis not present

## 2018-04-17 ENCOUNTER — Other Ambulatory Visit: Payer: Self-pay | Admitting: Family Medicine

## 2018-04-17 MED ORDER — HYDROCODONE-ACETAMINOPHEN 10-325 MG PO TABS: 1.0000 | ORAL_TABLET | Freq: Three times a day (TID) | ORAL | 0 refills | Status: DC | PRN

## 2018-04-17 NOTE — Telephone Encounter (Signed)
Ok to refill??  Last office visit 04/07/2018.   Last refill 03/20/2018.

## 2018-04-26 ENCOUNTER — Other Ambulatory Visit: Payer: Self-pay | Admitting: Family Medicine

## 2018-04-28 ENCOUNTER — Other Ambulatory Visit: Payer: Medicare Other

## 2018-04-28 DIAGNOSIS — N189 Chronic kidney disease, unspecified: Secondary | ICD-10-CM | POA: Diagnosis not present

## 2018-04-28 DIAGNOSIS — E875 Hyperkalemia: Secondary | ICD-10-CM | POA: Diagnosis not present

## 2018-04-29 LAB — BASIC METABOLIC PANEL WITH GFR
BUN/Creatinine Ratio: 16 (calc) (ref 6–22)
BUN: 25 mg/dL (ref 7–25)
CALCIUM: 9.2 mg/dL (ref 8.6–10.3)
CHLORIDE: 106 mmol/L (ref 98–110)
CO2: 24 mmol/L (ref 20–32)
Creat: 1.54 mg/dL — ABNORMAL HIGH (ref 0.70–1.25)
GFR, Est African American: 53 mL/min/{1.73_m2} — ABNORMAL LOW (ref >=60)
GFR, Est Non African American: 46 mL/min/{1.73_m2} — ABNORMAL LOW (ref >=60)
GLUCOSE: 105 mg/dL — AB (ref 65–99)
POTASSIUM: 5.3 mmol/L (ref 3.5–5.3)
Sodium: 137 mmol/L (ref 135–146)

## 2018-04-29 LAB — EXTRA LAV TOP TUBE

## 2018-05-09 DIAGNOSIS — G4733 Obstructive sleep apnea (adult) (pediatric): Secondary | ICD-10-CM | POA: Diagnosis not present

## 2018-05-12 ENCOUNTER — Telehealth (HOSPITAL_COMMUNITY): Payer: Self-pay

## 2018-05-12 NOTE — Telephone Encounter (Signed)
Patients wife called stating patient had enlarged lymph nodes on both sides of his neck. They are sore and making it difficult for him to swallow. He also has noticed an enlarged lymph node under his axilla. Wife states she was told by MD that this is one of the symptoms they were to watch for. Reviewed with Dr. Delton Coombes. He wants patients appt to be moved up from 06/06/18 to sometime next week. Notified wife that scheduling will be calling with appt. She verbalized understanding. Message sent to scheduling.

## 2018-05-16 ENCOUNTER — Other Ambulatory Visit: Payer: Self-pay | Admitting: Family Medicine

## 2018-05-16 MED ORDER — HYDROCODONE-ACETAMINOPHEN 10-325 MG PO TABS: 1.0000 | ORAL_TABLET | Freq: Three times a day (TID) | ORAL | 0 refills | Status: DC | PRN

## 2018-05-16 NOTE — Telephone Encounter (Signed)
Ok to refill??  Last office visit 04/07/2018.  Last refill 04/17/2018.

## 2018-05-17 ENCOUNTER — Inpatient Hospital Stay (HOSPITAL_COMMUNITY): Payer: Medicare Other | Attending: Hematology

## 2018-05-17 DIAGNOSIS — N183 Chronic kidney disease, stage 3 unspecified: Secondary | ICD-10-CM

## 2018-05-17 DIAGNOSIS — C9110 Chronic lymphocytic leukemia of B-cell type not having achieved remission: Secondary | ICD-10-CM | POA: Diagnosis not present

## 2018-05-17 DIAGNOSIS — D631 Anemia in chronic kidney disease: Secondary | ICD-10-CM | POA: Diagnosis not present

## 2018-05-17 DIAGNOSIS — D801 Nonfamilial hypogammaglobulinemia: Secondary | ICD-10-CM | POA: Diagnosis not present

## 2018-05-17 DIAGNOSIS — Z85528 Personal history of other malignant neoplasm of kidney: Secondary | ICD-10-CM

## 2018-05-17 DIAGNOSIS — D591 Other autoimmune hemolytic anemias: Secondary | ICD-10-CM | POA: Diagnosis not present

## 2018-05-17 LAB — CBC WITH DIFFERENTIAL/PLATELET
BAND NEUTROPHILS: 0 %
BASOS PCT: 0 %
Basophils Absolute: 0 10*3/uL (ref 0.0–0.1)
Blasts: 6 %
EOS PCT: 0 %
Eosinophils Absolute: 0 10*3/uL (ref 0.0–0.7)
HCT: 26.5 % — ABNORMAL LOW (ref 39.0–52.0)
Hemoglobin: 7.8 g/dL — ABNORMAL LOW (ref 13.0–17.0)
LYMPHS ABS: 123.9 10*3/uL — AB (ref 0.7–4.0)
LYMPHS PCT: 91 %
MCH: 35 pg — AB (ref 26.0–34.0)
MCHC: 29.4 g/dL — AB (ref 30.0–36.0)
MCV: 118.8 fL — ABNORMAL HIGH (ref 78.0–100.0)
MONO ABS: 0 10*3/uL — AB (ref 0.1–1.0)
MONOS PCT: 0 %
Metamyelocytes Relative: 0 %
Myelocytes: 0 %
NEUTROS ABS: 4.1 10*3/uL (ref 1.7–7.7)
NEUTROS PCT: 3 %
OTHER: 0 %
PLATELETS: 180 10*3/uL (ref 150–400)
PROMYELOCYTES RELATIVE: 0 %
RBC: 2.23 MIL/uL — ABNORMAL LOW (ref 4.22–5.81)
RDW: 17.6 % — AB (ref 11.5–15.5)
WBC: 136.2 10*3/uL (ref 4.0–10.5)
nRBC: 0 /100 WBC

## 2018-05-17 NOTE — Progress Notes (Signed)
CRITICAL VALUE ALERT Critical value received:  WBC-136.2 Date of notification:  05/17/18 Time of notification: 7943 Critical value read back:  Yes.   Nurse who received alert:  A.Darnelle Maffucci, LPN MD notified (1st page):  S.Katragadda, MD

## 2018-05-19 ENCOUNTER — Inpatient Hospital Stay (HOSPITAL_BASED_OUTPATIENT_CLINIC_OR_DEPARTMENT_OTHER): Payer: Medicare Other | Admitting: Hematology

## 2018-05-19 ENCOUNTER — Other Ambulatory Visit (HOSPITAL_COMMUNITY): Payer: Medicare Other

## 2018-05-19 ENCOUNTER — Encounter (HOSPITAL_COMMUNITY): Payer: Self-pay | Admitting: Hematology

## 2018-05-19 VITALS — BP 136/52 | HR 80 | Temp 97.6°F | Resp 18 | Wt 230.0 lb

## 2018-05-19 DIAGNOSIS — N183 Chronic kidney disease, stage 3 unspecified: Secondary | ICD-10-CM

## 2018-05-19 DIAGNOSIS — C9110 Chronic lymphocytic leukemia of B-cell type not having achieved remission: Secondary | ICD-10-CM

## 2018-05-19 DIAGNOSIS — D631 Anemia in chronic kidney disease: Secondary | ICD-10-CM | POA: Diagnosis not present

## 2018-05-19 DIAGNOSIS — D801 Nonfamilial hypogammaglobulinemia: Secondary | ICD-10-CM

## 2018-05-19 DIAGNOSIS — D591 Other autoimmune hemolytic anemias: Secondary | ICD-10-CM

## 2018-05-19 LAB — CBC WITH DIFFERENTIAL/PLATELET
BAND NEUTROPHILS: 0 %
BASOS PCT: 0 %
Basophils Absolute: 0 10*3/uL (ref 0.0–0.1)
Blasts: 0 %
EOS ABS: 0 10*3/uL (ref 0.0–0.7)
EOS PCT: 0 %
HCT: 24.8 % — ABNORMAL LOW (ref 39.0–52.0)
Hemoglobin: 7.1 g/dL — ABNORMAL LOW (ref 13.0–17.0)
LYMPHS ABS: 124.3 10*3/uL — AB (ref 0.7–4.0)
LYMPHS PCT: 99 %
MCH: 33.8 pg (ref 26.0–34.0)
MCHC: 28.6 g/dL — AB (ref 30.0–36.0)
MCV: 118.1 fL — ABNORMAL HIGH (ref 78.0–100.0)
MONO ABS: 0 10*3/uL — AB (ref 0.1–1.0)
Metamyelocytes Relative: 0 %
Monocytes Relative: 0 %
Myelocytes: 0 %
NEUTROS PCT: 1 %
NRBC: 0 /100{WBCs}
Neutro Abs: 1.3 10*3/uL — ABNORMAL LOW (ref 1.7–7.7)
OTHER: 0 %
PLATELETS: 172 10*3/uL (ref 150–400)
Promyelocytes Relative: 0 %
RBC: 2.1 MIL/uL — ABNORMAL LOW (ref 4.22–5.81)
RDW: 17.3 % — AB (ref 11.5–15.5)
WBC: 125.6 10*3/uL (ref 4.0–10.5)

## 2018-05-19 LAB — VITAMIN B12: VITAMIN B 12: 1301 pg/mL — AB (ref 180–914)

## 2018-05-19 LAB — COMPREHENSIVE METABOLIC PANEL
ALT: 10 U/L — ABNORMAL LOW (ref 17–63)
ANION GAP: 4 — AB (ref 5–15)
AST: 22 U/L (ref 15–41)
Albumin: 4.3 g/dL (ref 3.5–5.0)
Alkaline Phosphatase: 50 U/L (ref 38–126)
BUN: 23 mg/dL — ABNORMAL HIGH (ref 6–20)
CALCIUM: 9 mg/dL (ref 8.9–10.3)
CHLORIDE: 108 mmol/L (ref 101–111)
CO2: 26 mmol/L (ref 22–32)
Creatinine, Ser: 1.43 mg/dL — ABNORMAL HIGH (ref 0.61–1.24)
GFR calc Af Amer: 57 mL/min — ABNORMAL LOW (ref >60)
GFR calc non Af Amer: 49 mL/min — ABNORMAL LOW (ref >60)
Glucose, Bld: 120 mg/dL — ABNORMAL HIGH (ref 65–99)
POTASSIUM: 5.8 mmol/L — AB (ref 3.5–5.1)
SODIUM: 138 mmol/L (ref 135–145)
Total Bilirubin: 1.4 mg/dL — ABNORMAL HIGH (ref 0.3–1.2)
Total Protein: 6.1 g/dL — ABNORMAL LOW (ref 6.5–8.1)

## 2018-05-19 LAB — RETICULOCYTES
RBC.: 2.1 MIL/uL — AB (ref 4.22–5.81)
RETIC COUNT ABSOLUTE: 157.5 10*3/uL (ref 19.0–186.0)
RETIC CT PCT: 7.5 % — AB (ref 0.4–3.1)

## 2018-05-19 LAB — DIRECT ANTIGLOBULIN TEST (NOT AT ARMC)
DAT, IGG: POSITIVE
DAT, complement: POSITIVE

## 2018-05-19 LAB — IRON AND TIBC
Iron: 83 ug/dL (ref 45–182)
Saturation Ratios: 36 % (ref 17.9–39.5)
TIBC: 231 ug/dL — ABNORMAL LOW (ref 250–450)
UIBC: 148 ug/dL

## 2018-05-19 LAB — LACTATE DEHYDROGENASE: LDH: 219 U/L — AB (ref 98–192)

## 2018-05-19 LAB — FERRITIN: Ferritin: 270 ng/mL (ref 24–336)

## 2018-05-19 LAB — FOLATE: Folate: 23.2 ng/mL (ref >5.9)

## 2018-05-19 MED ORDER — EPOETIN ALFA 40000 UNIT/ML IJ SOLN
30000.0000 [IU] | Freq: Once | INTRAMUSCULAR | Status: AC
  Administered 2018-05-19: 30000 [IU] via SUBCUTANEOUS
  Filled 2018-05-19: qty 1

## 2018-05-19 MED ORDER — PREDNISONE 20 MG PO TABS
100.0000 mg | ORAL_TABLET | Freq: Every day | ORAL | 0 refills | Status: DC
Start: 2018-05-19 — End: 2018-06-02

## 2018-05-19 NOTE — Progress Notes (Signed)
[name] Ball presents today for injection per the provider's orders.  Procrit administration without incident; see MAR for injection details.  Patient tolerated procedure well and without incident.  No questions or complaints noted at this time.  Discharged ambulatory in c/o spouse.

## 2018-05-19 NOTE — Progress Notes (Signed)
CRITICAL VALUE ALERT Critical value received:  WBC-125.6 Date of notification:  05/19/18 Time of notification: 0272 Critical value read back:  Yes.   Nurse who received alert:  M.Arleta Ostrum,LPN MD notified (1st page):  S.katragadda,MD

## 2018-05-19 NOTE — Assessment & Plan Note (Signed)
1.  Stage III CLL by Rai classification: -Since his last visit, he has been feeling more tired in the past few weeks.  He thinks he might have gotten a sore throat from his grandkid.  He complains of hurting on the side of his neck, worse on swallowing.  I have examined his throat which did not reveal any erythema or exudate.  Neck has some very small palpable lymph nodes.  Abdominal examination shows splenomegaly. -I have reviewed CT scan of the abdomen and pelvis dated 04/04/2018 which showed abdominal and pelvic adenopathy, new onset with splenomegaly. - His most recent hemoglobin dropped to 7.8  2 days ago.  This was 12.4 at last visit.  I have repeated a CBC today in the office when his hemoglobin further dropped to 7.1.  He has macrocytosis which could be from reticulocytosis.  Hence we checked hemolytic anemia panel.  LDH was elevated.  Reticulocyte count was 7%.  Coombs IgG was positive.  Complement test was pending.  He is a Restaurant manager, fast food.  He cannot take blood transfusions.  He is okay with receiving Procrit injections.  Hence I have ordered Procrit 30,000 units today.  He feels very tired but denies any chest pains or lightheadedness at this time.  For his autoimmune hemolytic anemia, I have ordered prednisone 1 mg/kg rounded to 100 mg daily.  He will start taking it tonight.  We will closely follow him.  I will bring him back on Monday morning.  He was told to go to the emergency room if he develops any chest pains, fevers or drop in blood pressure.  2.  Hypogammaglobulinemia: Despite his IgG levels in the 400 range, he does not have any recurrent infections requiring antibiotics.  He was treated with IVIG in February 2017, which was discontinued secondary to high cost.  3.  Stage III CKD: He had a left nephrectomy for renal cell carcinoma in 2005.  He continues to be in remission.  His creatinine is stable.

## 2018-05-19 NOTE — Progress Notes (Signed)
[place] [place], [place] [address]   CLINIC:  Medical Oncology/Hematology  PCP:  Alycia Rossetti, [place] [address] [phone]   REASON FOR VISIT:  Follow-up for CLL.  CURRENT THERAPY: Prednisone and Procrit.   INTERVAL HISTORY:  Mr. Dusza 67 y.o. male returns for follow-up of his CLL.  He was tired lately.  His most recent CBC shows hemoglobin of 7.8.  Hence he was seen before his usual appointment.  He complained of having sore throat few weeks ago.  He still has some soreness in the throat, made worse by swallowing.  Denies any fevers or chills.  Feels like severely weak.  Denies any chest pains.  Very rare lightheadedness.  Wife felt some lymph nodes in the neck.  Denies any bleeding per rectum or melena.   REVIEW OF SYSTEMS:  Review of Systems  Constitutional: Positive for fatigue.  HENT:   Positive for sore throat.   Neurological: Positive for dizziness.  All other systems reviewed and are negative.    PAST MEDICAL/SURGICAL HISTORY:  Past Medical History:  Diagnosis Date  . Allergy   . Arthritis   . Asthma   . Cancer ([place])    kidney  . CLL (chronic lymphocytic leukemia) ([place]) 2016  . COPD (chronic obstructive pulmonary disease) ([place])   . Diverticulitis   . Diverticulosis    colonoscopy 3/15  . GERD (gastroesophageal reflux disease)   . Hypertension   . Low testosterone   . Migraine   . Shingles 03/31/2016  . Solar purpura ([place])   . Thyroid disease    hypothryoid  . Ulcer   . Ventral hernia FEB 2015 CT    Large infra umbilical ventral hernia containing loops of small   Past Surgical History:  Procedure Laterality Date  . abdominal mesh inserted  june 2006, may 2008,dec 2010  . CHOLECYSTECTOMY  june 2007  . COLON SURGERY  sept 2007  . COLONOSCOPY    . COLONOSCOPY N/A 02/20/2014   Procedure: COLONOSCOPY;  Surgeon: Danie Binder, MD;  Location: [facility];  Service: Endoscopy;   Laterality: N/A;  . HERNIA REPAIR  05/2005 5/20108, 11/2009   with mesh  . KIDNEY CYST REMOVAL  jan 2005   renal cell carcinoma  . knee minescus repair Left feb 2007  . NEPHRECTOMY Left 12/2003     SOCIAL HISTORY:  Social History   Socioeconomic History  . Marital status: Married    Spouse name: Zigmund Daniel  . Number of children: 4  . Years of education: 54  . Highest education level: Not on file  Occupational History    Comment: retired  Scientific laboratory technician  . Financial resource strain: Not on file  . Food insecurity:    Worry: Not on file    Inability: Not on file  . Transportation needs:    Medical: Not on file    Non-medical: Not on file  Tobacco Use  . Smoking status: Former Smoker    Last attempt to quit: 12/06/1996    Years since quitting: 21.4  . Smokeless tobacco: Never Used  Substance and Sexual Activity  . Alcohol use: Yes    Comment: a few shots a few times a month  . Drug use: No  . Sexual activity: Not on file  Lifestyle  . Physical activity:    Days per week: Not on file    Minutes per session: Not on file  .  Stress: Not on file  Relationships  . Social connections:    Talks on phone: Not on file    Gets together: Not on file    Attends religious service: Not on file    Active member of club or organization: Not on file    Attends meetings of clubs or organizations: Not on file    Relationship status: Not on file  . Intimate partner violence:    Fear of current or ex partner: Not on file    Emotionally abused: Not on file    Physically abused: Not on file    Forced sexual activity: Not on file  Other Topics Concern  . 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   Lives at home with wife, daughter, family   Caffeine- sodas, 2 daily    FAMILY HISTORY:  Family History  Problem Relation Age of Onset  . Cancer Mother        lung, met to brain  . Diabetes Mellitus II Sister   . Colon cancer Neg Hx      CURRENT MEDICATIONS:  Outpatient Encounter Medications as of 05/19/2018  Medication Sig  . acetaminophen (TYLENOL) 325 MG tablet Take 650 mg by mouth every 6 (six) hours as needed.  Marland Kitchen albuterol (PROVENTIL) (2.5 MG/3ML) 0.083% nebulizer solution TAKE 3MLS(1 VIAL) VIA NEBULIZER EVERY 6 HOURS AS NEEDED FOR WHEEZING OR SHORTNESS OF BREATH  . allopurinol (ZYLOPRIM) 100 MG tablet TAKE 1 TABLET(100 MG) BY MOUTH AT BEDTIME  . amLODipine (NORVASC) 10 MG tablet TAKE 1 TABLET BY MOUTH DAILY  . ATROVENT HFA 17 MCG/ACT inhaler INHALE 2 PUFFS BY MOUTH INTO THE LUNGS EVERY 6 HOURS  . calcium carbonate (TUMS - DOSED IN MG ELEMENTAL CALCIUM) 500 MG chewable tablet Chew 2-3 tablets by mouth once as needed for indigestion or heartburn. Reported on 11/21/2015  . citalopram (CELEXA) 10 MG tablet TAKE 1 TABLET(10 MG) BY MOUTH DAILY  . clobetasol cream (TEMOVATE) 3.23 % Apply 1 application topically 2 (two) times daily.  . clopidogrel (PLAVIX) 75 MG tablet TAKE 1 TABLET(75 MG) BY MOUTH DAILY  . diphenhydrAMINE (BENADRYL) 25 MG tablet Take 25 mg by mouth every evening.   . Glucosamine-Chondroit-Vit C-Mn (GLUCOSAMINE 1500 COMPLEX) CAPS Take 1,200 capsules by mouth 2 (two) times daily.  Marland Kitchen HYDROcodone-acetaminophen (NORCO) 10-325 MG tablet Take 1-2 tablets by mouth every 8 (eight) hours as needed.  . levalbuterol (XOPENEX HFA) 45 MCG/ACT inhaler Inhale 1-2 puffs into the lungs every 4 (four) hours as needed for wheezing.  Marland Kitchen levofloxacin (LEVAQUIN) 500 MG tablet Take 1 tablet (500 mg total) by mouth daily.  Marland Kitchen levothyroxine (SYNTHROID, LEVOTHROID) 50 MCG tablet TAKE 1 TABLET(50 MCG) BY MOUTH DAILY BEFORE BREAKFAST  . loratadine (CLARITIN) 10 MG tablet Take 10 mg by mouth every morning.   . metoprolol tartrate (LOPRESSOR) 50 MG tablet TAKE 1 TABLET(50 MG) BY MOUTH TWICE DAILY  . mometasone (ASMANEX 60 METERED DOSES) 220 MCG/INH inhaler Inhale 2 puffs into the lungs 2 (two) times daily.  . Multiple Vitamin (MULTIVITAMIN  WITH MINERALS) TABS tablet Take 1 tablet by mouth daily.  . Omega-3 Fatty Acids (FISH OIL) 1000 MG CPDR Take by mouth 2 (two) times daily.  . ondansetron (ZOFRAN) 4 MG tablet Take 1 tablet (4 mg total) by mouth every 8 (eight) hours as needed for nausea or vomiting.  . ranitidine (ZANTAC) 75 MG tablet Take 75 mg by mouth 2 (two)  times daily.  . vitamin B-12 (CYANOCOBALAMIN) 1000 MCG tablet Take 1,000 mcg by mouth every morning.   . vitamin C (ASCORBIC ACID) 500 MG tablet Take 500 mg by mouth 2 (two) times daily.  . vitamin E 400 UNIT capsule Take 400 Units by mouth every morning.   . predniSONE (DELTASONE) 20 MG tablet Take 5 tablets (100 mg total) by mouth daily with breakfast.  . [EXPIRED] epoetin alfa (EPOGEN,PROCRIT) injection 30,000 Units    No facility-administered encounter medications on file as of 05/19/2018.     ALLERGIES:  Allergies  Allergen Reactions  . Contrast Media [Iodinated Diagnostic Agents]     Single functioning kidney  . Other Hives    Neoprene fabric Strawberry   . Ether Rash    Took a long time to wake up Doesn't wake up from it     PHYSICAL EXAM:  ECOG Performance status: 1.  Vitals:   05/19/18 1412  BP: (!) 136/52  Pulse: 80  Resp: 18  Temp: 97.6 F (36.4 C)  SpO2: 97%   Filed Weights   05/19/18 1412  Weight: 230 lb (104.3 kg)    Physical Exam HEENT: Oropharynx has no thrush, no exudates in the back of the throat. Chest: Bilateral clear to auscultation. CVS: S1-S2 regular rate and rhythm. Abdomen: Obese with spleen palpable 5 to 6 fingerbreadths below the left costal margin. Extremities: No edema or cyanosis. Lymphadenopathy: There is subcentimeter lymph nodes at bilateral angles of the jaw.  There is also subcentimeter lymph nodes under the axilla.  LABORATORY DATA:  I have reviewed the labs as listed.  CBC    Component Value Date/Time   WBC 125.6 (HH) 05/19/2018 1504   RBC 2.10 (L) 05/19/2018 1504   RBC 2.10 (L) 05/19/2018 1504    HGB 7.1 (L) 05/19/2018 1504   HCT 24.8 (L) 05/19/2018 1504   PLT 172 05/19/2018 1504   MCV 118.1 (H) 05/19/2018 1504   MCH 33.8 05/19/2018 1504   MCHC 28.6 (L) 05/19/2018 1504   RDW 17.3 (H) 05/19/2018 1504   LYMPHSABS 124.3 (H) 05/19/2018 1504   MONOABS 0.0 (L) 05/19/2018 1504   EOSABS 0.0 05/19/2018 1504   BASOSABS 0.0 05/19/2018 1504   CMP Latest Ref Rng & Units 05/19/2018 04/28/2018 04/07/2018  Glucose 65 - 99 mg/dL 120(H) 105(H) 115(H)  BUN 6 - 20 mg/dL 23(H) 25 20  Creatinine 0.61 - 1.24 mg/dL 1.43(H) 1.54(H) 1.63(H)  Sodium 135 - 145 mmol/L 138 137 140  Potassium 3.5 - 5.1 mmol/L 5.8(H) 5.3 5.5(H)  Chloride 101 - 111 mmol/L 108 106 107  CO2 22 - 32 mmol/L 26 24 24   Calcium 8.9 - 10.3 mg/dL 9.0 9.2 9.7  Total Protein 6.5 - 8.1 g/dL 6.1(L) - -  Total Bilirubin 0.3 - 1.2 mg/dL 1.4(H) - -  Alkaline Phos 38 - 126 U/L 50 - -  AST 15 - 41 U/L 22 - -  ALT 17 - 63 U/L 10(L) - -       DIAGNOSTIC IMAGING:  I have independently reviewed the images of CT scan from April 2019 and discussed the results with the patient and his wife.     ASSESSMENT & PLAN:   CLL (chronic lymphocytic leukemia) ([facility]) 1.  Stage III CLL by Rai classification: -Since his last visit, he has been feeling more tired in the past few weeks.  He thinks he might have gotten a sore throat from his grandkid.  He complains of hurting on the side of his  neck, worse on swallowing.  I have examined his throat which did not reveal any erythema or exudate.  Neck has some very small palpable lymph nodes.  Abdominal examination shows splenomegaly. -I have reviewed CT scan of the abdomen and pelvis dated 04/04/2018 which showed abdominal and pelvic adenopathy, new onset with splenomegaly. - His most recent hemoglobin dropped to 7.8  2 days ago.  This was 12.4 at last visit.  I have repeated a CBC today in the office when his hemoglobin further dropped to 7.1.  He has macrocytosis which could be from reticulocytosis.  Hence we  checked hemolytic anemia panel.  LDH was elevated.  Reticulocyte count was 7%.  Coombs IgG was positive.  Complement test was pending.  He is a Restaurant manager, fast food.  He cannot take blood transfusions.  He is okay with receiving Procrit injections.  Hence I have ordered Procrit 30,000 units today.  He feels very tired but denies any chest pains or lightheadedness at this time.  For his autoimmune hemolytic anemia, I have ordered prednisone 1 mg/kg rounded to 100 mg daily.  He will start taking it tonight.  We will closely follow him.  I will bring him back on Monday morning.  He was told to go to the emergency room if he develops any chest pains, fevers or drop in blood pressure.  2.  Hypogammaglobulinemia: Despite his IgG levels in the 400 range, he does not have any recurrent infections requiring antibiotics.  He was treated with IVIG in February 2017, which was discontinued secondary to high cost.  3.  Stage III CKD: He had a left nephrectomy for renal cell carcinoma in 2005.  He continues to be in remission.  His creatinine is stable.  Total time spent is 40 minutes with more than 50% of the time spent face-to-face discussing his new diagnosis of autoimmune hemolytic anemia, treatment options, prognosis and coordination of care.    Orders placed this encounter:  Orders Placed This Encounter  Procedures  . Reticulocytes  . Lactate dehydrogenase  . Vitamin B12  . Folate  . Iron and TIBC  . Ferritin  . Comprehensive metabolic panel  . CBC with Differential  . Haptoglobin  . Reticulocytes  . Lactate dehydrogenase  . Hepatic function panel  . CBC with Differential  . Direct antiglobulin test      Derek Jack, MD [place] [phone]

## 2018-05-19 NOTE — Patient Instructions (Signed)
[facility] at [facility]  Discharge Instructions:  You were seen by dr. k today _______________________________________________________________  Thank you for choosing [facility] at [facility] to provide your oncology and hematology care.  To afford each patient quality time with our providers, please arrive at least 15 minutes before your scheduled appointment.  You need to re-schedule your appointment if you arrive 10 or more minutes late.  We strive to give you quality time with our providers, and arriving late affects you and other patients whose appointments are after yours.  Also, if you no show three or more times for appointments you may be dismissed from the clinic.  Again, thank you for choosing [facility] at [facility]. Our hope is that these requests will allow you access to exceptional care and in a timely manner. _______________________________________________________________  If you have questions after your visit, please contact our office at [phone] between the hours of 8:30 a.m. and 5:00 p.m. Voicemails left after 4:30 p.m. will not be returned until the following business day. _______________________________________________________________  For prescription refill requests, have your pharmacy contact our office. _______________________________________________________________  Recommendations made by the consultant and any test results will be sent to your referring physician. _______________________________________________________________ 

## 2018-05-20 LAB — HAPTOGLOBIN: Haptoglobin: 19 mg/dL — ABNORMAL LOW (ref 34–200)

## 2018-05-22 ENCOUNTER — Other Ambulatory Visit: Payer: Self-pay

## 2018-05-22 ENCOUNTER — Inpatient Hospital Stay (HOSPITAL_COMMUNITY): Payer: Medicare Other

## 2018-05-22 ENCOUNTER — Encounter (HOSPITAL_COMMUNITY): Payer: Self-pay | Admitting: Hematology

## 2018-05-22 ENCOUNTER — Inpatient Hospital Stay (HOSPITAL_BASED_OUTPATIENT_CLINIC_OR_DEPARTMENT_OTHER): Payer: Medicare Other | Admitting: Hematology

## 2018-05-22 VITALS — BP 111/30 | HR 70 | Temp 98.3°F | Resp 18 | Wt 231.4 lb

## 2018-05-22 DIAGNOSIS — D631 Anemia in chronic kidney disease: Secondary | ICD-10-CM | POA: Diagnosis not present

## 2018-05-22 DIAGNOSIS — D801 Nonfamilial hypogammaglobulinemia: Secondary | ICD-10-CM

## 2018-05-22 DIAGNOSIS — D591 Autoimmune hemolytic anemia, unspecified: Secondary | ICD-10-CM

## 2018-05-22 DIAGNOSIS — C9110 Chronic lymphocytic leukemia of B-cell type not having achieved remission: Secondary | ICD-10-CM

## 2018-05-22 DIAGNOSIS — N183 Chronic kidney disease, stage 3 unspecified: Secondary | ICD-10-CM

## 2018-05-22 LAB — CBC WITH DIFFERENTIAL/PLATELET
BAND NEUTROPHILS: 0 %
BASOS ABS: 0 10*3/uL (ref 0.0–0.1)
BLASTS: 0 %
Basophils Relative: 0 %
EOS ABS: 0 10*3/uL (ref 0.0–0.7)
EOS PCT: 0 %
HCT: 27.6 % — ABNORMAL LOW (ref 39.0–52.0)
Hemoglobin: 7.4 g/dL — ABNORMAL LOW (ref 13.0–17.0)
LYMPHS ABS: 222.9 10*3/uL — AB (ref 0.7–4.0)
Lymphocytes Relative: 98 %
MCH: 32.9 pg (ref 26.0–34.0)
MCHC: 26.8 g/dL — AB (ref 30.0–36.0)
MCV: 122.7 fL — ABNORMAL HIGH (ref 78.0–100.0)
METAMYELOCYTES PCT: 0 %
Monocytes Absolute: 0 10*3/uL — ABNORMAL LOW (ref 0.1–1.0)
Monocytes Relative: 0 %
Myelocytes: 0 %
NEUTROS ABS: 4.6 10*3/uL (ref 1.7–7.7)
Neutrophils Relative %: 2 %
Other: 0 %
PLATELETS: 218 10*3/uL (ref 150–400)
Promyelocytes Relative: 0 %
RBC: 2.25 MIL/uL — ABNORMAL LOW (ref 4.22–5.81)
RDW: 20.2 % — AB (ref 11.5–15.5)
WBC: 227.5 10*3/uL (ref 4.0–10.5)
nRBC: 0 /100 WBC

## 2018-05-22 LAB — LACTATE DEHYDROGENASE: LDH: 145 U/L (ref 98–192)

## 2018-05-22 LAB — RETICULOCYTES
RBC.: 2.25 MIL/uL — AB (ref 4.22–5.81)
RETIC COUNT ABSOLUTE: 227.3 10*3/uL — AB (ref 19.0–186.0)
Retic Ct Pct: 10.1 % — ABNORMAL HIGH (ref 0.4–3.1)

## 2018-05-22 LAB — HEPATIC FUNCTION PANEL
ALBUMIN: 4.1 g/dL (ref 3.5–5.0)
ALT: 11 U/L — ABNORMAL LOW (ref 17–63)
AST: 18 U/L (ref 15–41)
Alkaline Phosphatase: 45 U/L (ref 38–126)
BILIRUBIN DIRECT: 0.1 mg/dL (ref 0.1–0.5)
BILIRUBIN INDIRECT: 1.3 mg/dL — AB (ref 0.3–0.9)
BILIRUBIN TOTAL: 1.4 mg/dL — AB (ref 0.3–1.2)
Total Protein: 6.1 g/dL — ABNORMAL LOW (ref 6.5–8.1)

## 2018-05-22 MED ORDER — EPOETIN ALFA 20000 UNIT/ML IJ SOLN
60000.0000 [IU] | Freq: Once | INTRAMUSCULAR | Status: AC
  Administered 2018-05-22: 60000 [IU] via SUBCUTANEOUS

## 2018-05-22 MED ORDER — EPOETIN ALFA 40000 UNIT/ML IJ SOLN
INTRAMUSCULAR | Status: AC
  Filled 2018-05-22: qty 1

## 2018-05-22 MED ORDER — FOLIC ACID 1 MG PO TABS: 1.0000 mg | ORAL_TABLET | Freq: Every day | ORAL | 3 refills | Status: AC

## 2018-05-22 MED ORDER — EPOETIN ALFA 20000 UNIT/ML IJ SOLN
INTRAMUSCULAR | Status: AC
  Filled 2018-05-22: qty 1

## 2018-05-22 NOTE — Progress Notes (Signed)
[place], [place] [address]   CLINIC:  Medical Oncology/Hematology  PCP:  Alycia Rossetti, MD [address] [place] [address] [phone]   REASON FOR VISIT:  Follow-up for autoimmune hemolytic anemia/CLL.  CURRENT THERAPY: Procrit and prednisone.   INTERVAL HISTORY:  [name] Ball 67 y.o. male returns for follow-up of autoimmune hemolytic anemia.  We started him on prednisone last Friday.  He is tolerating it very well.  He felt better yesterday in terms of energy.  Denies any fevers or infections.  Denies any bleeding per rectum or melena.  No lightheadedness was reported.  REVIEW OF SYSTEMS:  Review of Systems  Neurological: Positive for headaches.  All other systems reviewed and are negative.    PAST MEDICAL/SURGICAL HISTORY:  Past Medical History:  Diagnosis Date  . Allergy   . Arthritis   . Asthma   . Cancer ([place])    kidney  . CLL (chronic lymphocytic leukemia) ([place]) 2016  . COPD (chronic obstructive pulmonary disease) ([place])   . Diverticulitis   . Diverticulosis    colonoscopy 3/15  . GERD (gastroesophageal reflux disease)   . Hypertension   . Low testosterone   . Migraine   . Shingles 03/31/2016  . Solar purpura ([place])   . Thyroid disease    hypothryoid  . Ulcer   . Ventral hernia FEB 2015 CT    Large infra umbilical ventral hernia containing loops of small   Past Surgical History:  Procedure Laterality Date  . abdominal mesh inserted  june 2006, may 2008,dec 2010  . CHOLECYSTECTOMY  june 2007  . COLON SURGERY  sept 2007  . COLONOSCOPY    . COLONOSCOPY N/A 02/20/2014   Procedure: COLONOSCOPY;  Surgeon: Danie Binder, MD;  Location[facility];  Service: Endoscopy;  Laterality: N/A;  . HERNIA REPAIR  05/2005 5/20108, 11/2009   with mesh  . KIDNEY CYST REMOVAL  jan 2005   renal cell carcinoma  . knee minescus repair Left feb 2007  . NEPHRECTOMY Left 12/2003     SOCIAL HISTORY:  Social  History   Socioeconomic History  . Marital status: Married    Spouse name: Zigmund Daniel  . Number of children: 4  . Years of education: 72  . Highest education level: Not on file  Occupational History    Comment: retired  Scientific laboratory technician  . Financial resource strain: Not on file  . Food insecurity:    Worry: Not on file    Inability: Not on file  . Transportation needs:    Medical: Not on file    Non-medical: Not on file  Tobacco Use  . Smoking status: Former Smoker    Last attempt to quit: 12/06/1996    Years since quitting: 21.4  . Smokeless tobacco: Never Used  Substance and Sexual Activity  . Alcohol use: Yes    Comment: a few shots a few times a month  . Drug use: No  . Sexual activity: Not on file  Lifestyle  . Physical activity:    Days per week: Not on file    Minutes per session: Not on file  . Stress: Not on file  Relationships  . Social connections:    Talks on phone: Not on file    Gets together: Not on file    Attends religious service: Not on file    Active member of club or organization: Not on file    Attends  meetings of clubs or organizations: Not on file    Relationship status: Not on file  . Intimate partner violence:    Fear of current or ex partner: Not on file    Emotionally abused: Not on file    Physically abused: Not on file    Forced sexual activity: Not on file  Other Topics Concern  . 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   Lives at home with wife, daughter, family   Caffeine- sodas, 2 daily    FAMILY HISTORY:  Family History  Problem Relation Age of Onset  . Cancer Mother        lung, met to brain  . Diabetes Mellitus II Sister   . Colon cancer Neg Hx     CURRENT MEDICATIONS:  Outpatient Encounter Medications as of 05/22/2018  Medication Sig  . acetaminophen (TYLENOL) 325 MG tablet Take 650 mg by mouth every 6 (six) hours as needed.  Marland Kitchen albuterol (PROVENTIL) (2.5 MG/3ML) 0.083%  nebulizer solution TAKE 3MLS(1 VIAL) VIA NEBULIZER EVERY 6 HOURS AS NEEDED FOR WHEEZING OR SHORTNESS OF BREATH  . allopurinol (ZYLOPRIM) 100 MG tablet TAKE 1 TABLET(100 MG) BY MOUTH AT BEDTIME  . amLODipine (NORVASC) 10 MG tablet TAKE 1 TABLET BY MOUTH DAILY  . ATROVENT HFA 17 MCG/ACT inhaler INHALE 2 PUFFS BY MOUTH INTO THE LUNGS EVERY 6 HOURS  . calcium carbonate (TUMS - DOSED IN MG ELEMENTAL CALCIUM) 500 MG chewable tablet Chew 2-3 tablets by mouth once as needed for indigestion or heartburn. Reported on 11/21/2015  . citalopram (CELEXA) 10 MG tablet TAKE 1 TABLET(10 MG) BY MOUTH DAILY  . clobetasol cream (TEMOVATE) 8.67 % Apply 1 application topically 2 (two) times daily.  . clopidogrel (PLAVIX) 75 MG tablet TAKE 1 TABLET(75 MG) BY MOUTH DAILY  . diphenhydrAMINE (BENADRYL) 25 MG tablet Take 25 mg by mouth every evening.   . Glucosamine-Chondroit-Vit C-Mn (GLUCOSAMINE 1500 COMPLEX) CAPS Take 1,200 capsules by mouth 2 (two) times daily.  Marland Kitchen HYDROcodone-acetaminophen (NORCO) 10-325 MG tablet Take 1-2 tablets by mouth every 8 (eight) hours as needed.  . levalbuterol (XOPENEX HFA) 45 MCG/ACT inhaler Inhale 1-2 puffs into the lungs every 4 (four) hours as needed for wheezing.  Marland Kitchen levothyroxine (SYNTHROID, LEVOTHROID) 50 MCG tablet TAKE 1 TABLET(50 MCG) BY MOUTH DAILY BEFORE BREAKFAST  . loratadine (CLARITIN) 10 MG tablet Take 10 mg by mouth every morning.   . metoprolol tartrate (LOPRESSOR) 50 MG tablet TAKE 1 TABLET(50 MG) BY MOUTH TWICE DAILY  . mometasone (ASMANEX 60 METERED DOSES) 220 MCG/INH inhaler Inhale 2 puffs into the lungs 2 (two) times daily.  . Multiple Vitamin (MULTIVITAMIN WITH MINERALS) TABS tablet Take 1 tablet by mouth daily.  . Omega-3 Fatty Acids (FISH OIL) 1000 MG CPDR Take by mouth 2 (two) times daily.  . ondansetron (ZOFRAN) 4 MG tablet Take 1 tablet (4 mg total) by mouth every 8 (eight) hours as needed for nausea or vomiting.  . predniSONE (DELTASONE) 20 MG tablet Take 5  tablets (100 mg total) by mouth daily with breakfast.  . ranitidine (ZANTAC) 75 MG tablet Take 75 mg by mouth 2 (two) times daily.  . vitamin B-12 (CYANOCOBALAMIN) 1000 MCG tablet Take 1,000 mcg by mouth every morning.   . vitamin C (ASCORBIC ACID) 500 MG tablet Take 500 mg by mouth 2 (two) times daily.  . vitamin E 400 UNIT capsule Take 400 Units by mouth every morning.   . [  DISCONTINUED] levofloxacin (LEVAQUIN) 500 MG tablet Take 1 tablet (500 mg total) by mouth daily.   Facility-Administered Encounter Medications as of 05/22/2018  Medication  . epoetin alfa (EPOGEN,PROCRIT) injection 60,000 Units    ALLERGIES:  Allergies  Allergen Reactions  . Contrast Media [Iodinated Diagnostic Agents]     Single functioning kidney  . Other Hives    Neoprene fabric Strawberry   . Ether Rash    Took a long time to wake up Doesn't wake up from it     PHYSICAL EXAM:  ECOG Performance status: 1  Vitals:   05/22/18 0840  BP: (!) 111/30  Pulse: 70  Resp: 18  Temp: 98.3 F (36.8 C)  SpO2: 98%   Filed Weights   05/22/18 0840  Weight: 231 lb 6.4 oz (105 kg)    Physical Exam   LABORATORY DATA:  I have reviewed the labs as listed.  CBC    Component Value Date/Time   WBC 227.5 (HH) 05/22/2018 0800   RBC 2.25 (L) 05/22/2018 0800   RBC 2.25 (L) 05/22/2018 0800   HGB 7.4 (L) 05/22/2018 0800   HCT 27.6 (L) 05/22/2018 0800   PLT 218 05/22/2018 0800   MCV 122.7 (H) 05/22/2018 0800   MCH 32.9 05/22/2018 0800   MCHC 26.8 (L) 05/22/2018 0800   RDW 20.2 (H) 05/22/2018 0800   LYMPHSABS 222.9 (H) 05/22/2018 0800   MONOABS 0.0 (L) 05/22/2018 0800   EOSABS 0.0 05/22/2018 0800   BASOSABS 0.0 05/22/2018 0800   CMP Latest Ref Rng & Units 05/22/2018 05/19/2018 04/28/2018  Glucose 65 - 99 mg/dL - 120(H) 105(H)  BUN 6 - 20 mg/dL - 23(H) 25  Creatinine 0.61 - 1.24 mg/dL - 1.43(H) 1.54(H)  Sodium 135 - 145 mmol/L - 138 137  Potassium 3.5 - 5.1 mmol/L - 5.8(H) 5.3  Chloride 101 - 111 mmol/L -  108 106  CO2 22 - 32 mmol/L - 26 24  Calcium 8.9 - 10.3 mg/dL - 9.0 9.2  Total Protein 6.5 - 8.1 g/dL 6.1(L) 6.1(L) -  Total Bilirubin 0.3 - 1.2 mg/dL 1.4(H) 1.4(H) -  Alkaline Phos 38 - 126 U/L 45 50 -  AST 15 - 41 U/L 18 22 -  ALT 17 - 63 U/L 11(L) 10(L) -        ASSESSMENT & PLAN:   CLL (chronic lymphocytic leukemia) ([facility]) 1.  Autoimmune hemolytic anemia:  -He developed autoimmune hemolytic anemia as a complication of CLL.  Direct Coombs test was positive for IgG and Cd3.  We have started him on prednisone 1 mg/kg rounded of 200 mg daily on 05/19/2018.  He is tolerating it very well.  Today his hemoglobin has slightly improved to 7.4.  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  His LDH has normalized.  His reticulocyte count is still high at 10%.  We will proceed with Procrit today.  I will increase the dose to 60,000.  He will continue prednisone 100 mg daily for now.  We will reevaluate him in 1 week.  The rise in white count is secondary to prednisone.  2.  Hypogammaglobulinemia: Despite his IgG levels in the 400 range, he does not have any recurrent infections requiring antibiotics.  He was treated with IVIG in February 2017, which was discontinued secondary to high cost.  3.  Stage III CKD: He had a left nephrectomy for renal cell carcinoma in 2005.  He continues to be  in remission.  His creatinine is stable.      Orders placed this encounter:  Orders Placed This Encounter  Procedures  . CBC with Differential  . Comprehensive metabolic panel  . Reticulocytes  . Lactate dehydrogenase      Derek Jack, MD [place] [phone]

## 2018-05-22 NOTE — Assessment & Plan Note (Signed)
1.  Autoimmune hemolytic anemia:  -He developed autoimmune hemolytic anemia as a complication of CLL.  Direct Coombs test was positive for IgG and Cd3.  We have started him on prednisone 1 mg/kg rounded of 200 mg daily on 05/19/2018.  He is tolerating it very well.  Today his hemoglobin has slightly improved to 7.4.  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  His LDH has normalized.  His reticulocyte count is still high at 10%.  We will proceed with Procrit today.  I will increase the dose to 60,000.  He will continue prednisone 100 mg daily for now.  We will reevaluate him in 1 week.  The rise in white count is secondary to prednisone.  2.  Hypogammaglobulinemia: Despite his IgG levels in the 400 range, he does not have any recurrent infections requiring antibiotics.  He was treated with IVIG in February 2017, which was discontinued secondary to high cost.  3.  Stage III CKD: He had a left nephrectomy for renal cell carcinoma in 2005.  He continues to be in remission.  His creatinine is stable.

## 2018-05-22 NOTE — Progress Notes (Signed)
CRITICAL VALUE ALERT Critical value received:  WBC-227.5 Date of notification:  05/22/18 Time of notification: 0850 Critical value read back:  Yes.   Nurse who received alert:  M.Anum Palecek, LPN  MD notified (1st page):  S.Katragadda, MD

## 2018-05-22 NOTE — Progress Notes (Signed)
Procrit 60,000 units given per Dr. Delton Coombes. See MAR for details.

## 2018-05-30 ENCOUNTER — Inpatient Hospital Stay (HOSPITAL_COMMUNITY): Payer: Medicare Other

## 2018-05-30 ENCOUNTER — Other Ambulatory Visit: Payer: Self-pay

## 2018-05-30 ENCOUNTER — Inpatient Hospital Stay (HOSPITAL_BASED_OUTPATIENT_CLINIC_OR_DEPARTMENT_OTHER): Payer: Medicare Other | Admitting: Hematology

## 2018-05-30 ENCOUNTER — Encounter (HOSPITAL_COMMUNITY): Payer: Self-pay | Admitting: Hematology

## 2018-05-30 VITALS — BP 125/39 | HR 73 | Resp 16 | Wt 232.0 lb

## 2018-05-30 DIAGNOSIS — D591 Autoimmune hemolytic anemia, unspecified: Secondary | ICD-10-CM

## 2018-05-30 DIAGNOSIS — D801 Nonfamilial hypogammaglobulinemia: Secondary | ICD-10-CM

## 2018-05-30 DIAGNOSIS — N183 Chronic kidney disease, stage 3 unspecified: Secondary | ICD-10-CM

## 2018-05-30 DIAGNOSIS — C9110 Chronic lymphocytic leukemia of B-cell type not having achieved remission: Secondary | ICD-10-CM

## 2018-05-30 DIAGNOSIS — D631 Anemia in chronic kidney disease: Secondary | ICD-10-CM | POA: Diagnosis not present

## 2018-05-30 LAB — CBC WITH DIFFERENTIAL/PLATELET
BASOS PCT: 0 %
Basophils Absolute: 0 10*3/uL (ref 0.0–0.1)
EOS ABS: 0 10*3/uL (ref 0.0–0.7)
Eosinophils Relative: 0 %
HCT: 31.7 % — ABNORMAL LOW (ref 39.0–52.0)
Hemoglobin: 8.8 g/dL — ABNORMAL LOW (ref 13.0–17.0)
LYMPHS ABS: 176.8 10*3/uL — AB (ref 0.7–4.0)
Lymphocytes Relative: 96 %
MCH: 33.3 pg (ref 26.0–34.0)
MCHC: 27 g/dL — AB (ref 30.0–36.0)
MCV: 123.5 fL — AB (ref 78.0–100.0)
MONO ABS: 3.7 10*3/uL — AB (ref 0.1–1.0)
Monocytes Relative: 2 %
NEUTROS PCT: 2 %
Neutro Abs: 3.7 10*3/uL (ref 1.7–7.7)
PLATELETS: 181 10*3/uL (ref 150–400)
RBC: 2.64 MIL/uL — ABNORMAL LOW (ref 4.22–5.81)
RDW: 18.6 % — AB (ref 11.5–15.5)
WBC: 184.2 10*3/uL (ref 4.0–10.5)

## 2018-05-30 LAB — RETICULOCYTES
RBC.: 2.64 MIL/uL — AB (ref 4.22–5.81)
RETIC COUNT ABSOLUTE: 258.7 10*3/uL — AB (ref 19.0–186.0)
Retic Ct Pct: 9.8 % — ABNORMAL HIGH (ref 0.4–3.1)

## 2018-05-30 LAB — COMPREHENSIVE METABOLIC PANEL
ALT: 37 U/L (ref 0–44)
ANION GAP: 6 (ref 5–15)
AST: 19 U/L (ref 15–41)
Albumin: 3.7 g/dL (ref 3.5–5.0)
Alkaline Phosphatase: 34 U/L — ABNORMAL LOW (ref 38–126)
BUN: 38 mg/dL — ABNORMAL HIGH (ref 8–23)
CHLORIDE: 97 mmol/L — AB (ref 98–111)
CO2: 25 mmol/L (ref 22–32)
CREATININE: 1.46 mg/dL — AB (ref 0.61–1.24)
Calcium: 7.9 mg/dL — ABNORMAL LOW (ref 8.9–10.3)
GFR, EST AFRICAN AMERICAN: 55 mL/min — AB (ref >60)
GFR, EST NON AFRICAN AMERICAN: 48 mL/min — AB (ref >60)
Glucose, Bld: 122 mg/dL — ABNORMAL HIGH (ref 70–99)
POTASSIUM: 4.6 mmol/L (ref 3.5–5.1)
SODIUM: 128 mmol/L — AB (ref 135–145)
Total Bilirubin: 1.1 mg/dL (ref 0.3–1.2)
Total Protein: 5.6 g/dL — ABNORMAL LOW (ref 6.5–8.1)

## 2018-05-30 LAB — LACTATE DEHYDROGENASE: LDH: 114 U/L (ref 98–192)

## 2018-05-30 MED ORDER — EPOETIN ALFA 20000 UNIT/ML IJ SOLN
INTRAMUSCULAR | Status: AC
  Filled 2018-05-30: qty 1

## 2018-05-30 MED ORDER — EPOETIN ALFA 20000 UNIT/ML IJ SOLN
60000.0000 [IU] | Freq: Once | INTRAMUSCULAR | Status: AC
  Administered 2018-05-30: 60000 [IU] via SUBCUTANEOUS

## 2018-05-30 MED ORDER — EPOETIN ALFA 40000 UNIT/ML IJ SOLN
INTRAMUSCULAR | Status: AC
  Filled 2018-05-30: qty 1

## 2018-05-30 NOTE — Assessment & Plan Note (Addendum)
1.  Autoimmune hemolytic anemia:  -He developed autoimmune hemolytic anemia as a complication of CLL.  Direct Coombs test was positive for IgG and Cd3.  We have started him on prednisone 1 mg/kg rounded of to 100 mg daily on 05/19/2018.  He is tolerating it very well. 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  His LDH has normalized.  Today his hemoglobin has improved to 8.8.  Last week we have increased his Procrit to 60,000 units weekly.  He is tolerating prednisone reasonably well.  Once hemoglobin improves to his baseline, we will consider very slow taper of steroids.  He does feel like he has more energy recently.  We will continue weekly labs with Procrit.  I will see him back in 3 weeks for follow-up.  2.  Hypogammaglobulinemia: Despite his IgG levels in the 400 range, he does not have any recurrent infections requiring antibiotics.  He was treated with IVIG in February 2017, which was discontinued secondary to high cost.  3.  Stage III CKD: He had a left nephrectomy for renal cell carcinoma in 2005.  He continues to be in remission.  His creatinine is stable.  He is receiving Procrit weekly for combination anemia from CKD and hemolysis.

## 2018-05-30 NOTE — Progress Notes (Signed)
[place] [place], [place] [address]   CLINIC:  Medical Oncology/Hematology  PCP:  Alycia Rossetti, MD [address] [place] [address] [phone]   REASON FOR VISIT:  Follow-up for autoimmune hemolytic anemia and CLL.  CURRENT THERAPY: Prednisone 100 mg daily.  Procrit injections weekly.   INTERVAL HISTORY:  Mr. Loken 67 y.o. male returns for follow-up of his CLL and autoimmune hemolytic anemia.  He is tolerating Procrit weekly very well.  He is also taking prednisone 100 mg daily without any problems.  He gained about 2 pounds since the start of prednisone.  He reports that his taste has changed.  He feels like he has more energy lately.  But he is still resting a lot.  Denies any fevers, night sweats or weight loss.  Denies any bleeding per rectum or melena.  No fevers or infections were reported.    REVIEW OF SYSTEMS:  Review of Systems  Constitutional: Positive for fatigue.  HENT:   Positive for hearing loss.   All other systems reviewed and are negative.    PAST MEDICAL/SURGICAL HISTORY:  Past Medical History:  Diagnosis Date  . Allergy   . Arthritis   . Asthma   . Cancer ([facility])    kidney  . CLL (chronic lymphocytic leukemia) ([facility]) 2016  . COPD (chronic obstructive pulmonary disease) ([facility])   . Diverticulitis   . Diverticulosis    colonoscopy 3/15  . GERD (gastroesophageal reflux disease)   . Hypertension   . Low testosterone   . Migraine   . Shingles 03/31/2016  . Solar purpura ([facility])   . Thyroid disease    hypothryoid  . Ulcer   . Ventral hernia FEB 2015 CT    Large infra umbilical ventral hernia containing loops of small   Past Surgical History:  Procedure Laterality Date  . abdominal mesh inserted  june 2006, may 2008,dec 2010  . CHOLECYSTECTOMY  june 2007  . COLON SURGERY  sept 2007  . COLONOSCOPY    . COLONOSCOPY N/A 02/20/2014   Procedure: COLONOSCOPY;  Surgeon: Danie Binder, MD;  Location: [facility];  Service: Endoscopy;  Laterality: N/A;  . HERNIA REPAIR  05/2005 5/20108, 11/2009   with mesh  . KIDNEY CYST REMOVAL  jan 2005   renal cell carcinoma  . knee minescus repair Left feb 2007  . NEPHRECTOMY Left 12/2003     SOCIAL HISTORY:  Social History   Socioeconomic History  . Marital status: Married    Spouse name: Zigmund Daniel  . Number of children: 4  . Years of education: 50  . Highest education level: Not on file  Occupational History    Comment: retired  Scientific laboratory technician  . Financial resource strain: Not on file  . Food insecurity:    Worry: Not on file    Inability: Not on file  . Transportation needs:    Medical: Not on file    Non-medical: Not on file  Tobacco Use  . Smoking status: Former Smoker    Last attempt to quit: 12/06/1996    Years since quitting: 21.4  . Smokeless tobacco: Never Used  Substance and Sexual Activity  . Alcohol use: Yes    Comment: a few shots a few times a month  . Drug use: No  . Sexual activity: Not on file  Lifestyle  . Physical activity:    Days per week: Not on file    Minutes per  session: Not on file  . Stress: Not on file  Relationships  . Social connections:    Talks on phone: Not on file    Gets together: Not on file    Attends religious service: Not on file    Active member of club or organization: Not on file    Attends meetings of clubs or organizations: Not on file    Relationship status: Not on file  . Intimate partner violence:    Fear of current or ex partner: Not on file    Emotionally abused: Not on file    Physically abused: Not on file    Forced sexual activity: Not on file  Other Topics Concern  . 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   Lives at home with wife, daughter, family   Caffeine- sodas, 2 daily    FAMILY HISTORY:  Family History  Problem Relation Age of Onset  . Cancer Mother        lung, met to brain  . Diabetes Mellitus II Sister    . Colon cancer Neg Hx     CURRENT MEDICATIONS:  Outpatient Encounter Medications as of 05/30/2018  Medication Sig  . acetaminophen (TYLENOL) 325 MG tablet Take 650 mg by mouth every 6 (six) hours as needed.  Marland Kitchen albuterol (PROVENTIL) (2.5 MG/3ML) 0.083% nebulizer solution TAKE 3MLS(1 VIAL) VIA NEBULIZER EVERY 6 HOURS AS NEEDED FOR WHEEZING OR SHORTNESS OF BREATH  . allopurinol (ZYLOPRIM) 100 MG tablet TAKE 1 TABLET(100 MG) BY MOUTH AT BEDTIME  . amLODipine (NORVASC) 10 MG tablet TAKE 1 TABLET BY MOUTH DAILY  . ATROVENT HFA 17 MCG/ACT inhaler INHALE 2 PUFFS BY MOUTH INTO THE LUNGS EVERY 6 HOURS  . calcium carbonate (TUMS - DOSED IN MG ELEMENTAL CALCIUM) 500 MG chewable tablet Chew 2-3 tablets by mouth once as needed for indigestion or heartburn. Reported on 11/21/2015  . citalopram (CELEXA) 10 MG tablet TAKE 1 TABLET(10 MG) BY MOUTH DAILY  . clobetasol cream (TEMOVATE) 5.99 % Apply 1 application topically 2 (two) times daily.  . clopidogrel (PLAVIX) 75 MG tablet TAKE 1 TABLET(75 MG) BY MOUTH DAILY  . diphenhydrAMINE (BENADRYL) 25 MG tablet Take 25 mg by mouth every evening.   . folic acid (FOLVITE) 1 MG tablet Take 1 tablet (1 mg total) by mouth daily.  . Glucosamine-Chondroit-Vit C-Mn (GLUCOSAMINE 1500 COMPLEX) CAPS Take 1,200 capsules by mouth 2 (two) times daily.  Marland Kitchen HYDROcodone-acetaminophen (NORCO) 10-325 MG tablet Take 1-2 tablets by mouth every 8 (eight) hours as needed.  . levalbuterol (XOPENEX HFA) 45 MCG/ACT inhaler Inhale 1-2 puffs into the lungs every 4 (four) hours as needed for wheezing.  Marland Kitchen levothyroxine (SYNTHROID, LEVOTHROID) 50 MCG tablet TAKE 1 TABLET(50 MCG) BY MOUTH DAILY BEFORE BREAKFAST  . loratadine (CLARITIN) 10 MG tablet Take 10 mg by mouth every morning.   . metoprolol tartrate (LOPRESSOR) 50 MG tablet TAKE 1 TABLET(50 MG) BY MOUTH TWICE DAILY  . mometasone (ASMANEX 60 METERED DOSES) 220 MCG/INH inhaler Inhale 2 puffs into the lungs 2 (two) times daily.  . Multiple  Vitamin (MULTIVITAMIN WITH MINERALS) TABS tablet Take 1 tablet by mouth daily.  . Omega-3 Fatty Acids (FISH OIL) 1000 MG CPDR Take by mouth 2 (two) times daily.  . ondansetron (ZOFRAN) 4 MG tablet Take 1 tablet (4 mg total) by mouth every 8 (eight) hours as needed for nausea or vomiting.  . predniSONE (DELTASONE) 20 MG tablet  Take 5 tablets (100 mg total) by mouth daily with breakfast.  . ranitidine (ZANTAC) 75 MG tablet Take 75 mg by mouth 2 (two) times daily.  . vitamin B-12 (CYANOCOBALAMIN) 1000 MCG tablet Take 1,000 mcg by mouth every morning.   . vitamin C (ASCORBIC ACID) 500 MG tablet Take 500 mg by mouth 2 (two) times daily.  . vitamin E 400 UNIT capsule Take 400 Units by mouth every morning.   . [EXPIRED] epoetin alfa (EPOGEN,PROCRIT) injection 60,000 Units    No facility-administered encounter medications on file as of 05/30/2018.     ALLERGIES:  Allergies  Allergen Reactions  . Contrast Media [Iodinated Diagnostic Agents]     Single functioning kidney  . Other Hives    Neoprene fabric Strawberry   . Ether Rash    Took a long time to wake up Doesn't wake up from it     PHYSICAL EXAM:  ECOG Performance status: 1 I have reviewed his vitals.  Blood pressure is 12/29/1938.  Pulse is 73.  Respiratory 16.  O2 sats are 99%. Physical Exam HEENT: No thrush or other lesions noted. Chest: Bilaterally clear to auscultation. CVS: S1-S2 regular rate and rhythm. Skin: No rashes or ecchymosis. Abdomen: Soft obese nontender with no palpable masses. Extremities: No edema or cyanosis.  LABORATORY DATA:  I have reviewed the labs as listed.  CBC    Component Value Date/Time   WBC 184.2 (HH) 05/30/2018 0806   RBC 2.64 (L) 05/30/2018 0806   RBC 2.64 (L) 05/30/2018 0806   HGB 8.8 (L) 05/30/2018 0806   HCT 31.7 (L) 05/30/2018 0806   PLT 181 05/30/2018 0806   MCV 123.5 (H) 05/30/2018 0806   MCH 33.3 05/30/2018 0806   MCHC 27.0 (L) 05/30/2018 0806   RDW 18.6 (H) 05/30/2018 0806    LYMPHSABS 176.8 (H) 05/30/2018 0806   MONOABS 3.7 (H) 05/30/2018 0806   EOSABS 0.0 05/30/2018 0806   BASOSABS 0.0 05/30/2018 0806   CMP Latest Ref Rng & Units 05/30/2018 05/22/2018 05/19/2018  Glucose 70 - 99 mg/dL 122(H) - 120(H)  BUN 8 - 23 mg/dL 38(H) - 23(H)  Creatinine 0.61 - 1.24 mg/dL 1.46(H) - 1.43(H)  Sodium 135 - 145 mmol/L 128(L) - 138  Potassium 3.5 - 5.1 mmol/L 4.6 - 5.8(H)  Chloride 98 - 111 mmol/L 97(L) - 108  CO2 22 - 32 mmol/L 25 - 26  Calcium 8.9 - 10.3 mg/dL 7.9(L) - 9.0  Total Protein 6.5 - 8.1 g/dL 5.6(L) 6.1(L) 6.1(L)  Total Bilirubin 0.3 - 1.2 mg/dL 1.1 1.4(H) 1.4(H)  Alkaline Phos 38 - 126 U/L 34(L) 45 50  AST 15 - 41 U/L 19 18 22   ALT 0 - 44 U/L 37 11(L) 10(L)         ASSESSMENT & PLAN:   CLL (chronic lymphocytic leukemia) ([facility]) 1.  Autoimmune hemolytic anemia:  -He developed autoimmune hemolytic anemia as a complication of CLL.  Direct Coombs test was positive for IgG and Cd3.  We have started him on prednisone 1 mg/kg rounded of to 100 mg daily on 05/19/2018.  He is tolerating it very well. 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  His LDH has normalized.  Today his hemoglobin has improved to 8.8.  Last week we have increased his Procrit to 60,000 units weekly.  He is tolerating prednisone reasonably well.  Once hemoglobin improves to his baseline, we will consider very slow taper of steroids.  He does feel like he has more energy recently.  We will continue weekly labs with Procrit.  I will see him back in 3 weeks for follow-up.  2.  Hypogammaglobulinemia: Despite his IgG levels in the 400 range, he does not have any recurrent infections requiring antibiotics.  He was treated with IVIG in February 2017, which was discontinued secondary to high cost.  3.  Stage III CKD: He had a left nephrectomy for renal cell carcinoma in 2005.  He continues to be in remission.  His creatinine is stable.  He is  receiving Procrit weekly for combination anemia from CKD and hemolysis.      Orders placed this encounter:  Orders Placed This Encounter  Procedures  . Comprehensive metabolic panel  . CBC with Differential  . Lactate dehydrogenase  . Reticulocytes      Derek Jack, MD [place] [phone]

## 2018-05-30 NOTE — Progress Notes (Signed)
Procrit given today per orders. See MAR for details. Treatment given per orders. Patient tolerated it well without problems. Vitals stable and discharged home from clinic ambulatory. Follow up as scheduled.

## 2018-05-30 NOTE — Progress Notes (Signed)
CRITICAL VALUE ALERT Critical value received:  WBC Date of notification:  05/30/18 Time of notification: 0845 Critical value read back:  YES Nurse who received alert:  Maudry Diego MD notified (1st page):  Dr. Delton Coombes

## 2018-06-01 ENCOUNTER — Other Ambulatory Visit: Payer: Self-pay | Admitting: Family Medicine

## 2018-06-01 ENCOUNTER — Other Ambulatory Visit (HOSPITAL_COMMUNITY): Payer: Self-pay | Admitting: Hematology

## 2018-06-01 DIAGNOSIS — N183 Chronic kidney disease, stage 3 unspecified: Secondary | ICD-10-CM

## 2018-06-01 DIAGNOSIS — C9110 Chronic lymphocytic leukemia of B-cell type not having achieved remission: Secondary | ICD-10-CM

## 2018-06-02 ENCOUNTER — Other Ambulatory Visit (HOSPITAL_COMMUNITY): Payer: Self-pay | Admitting: Hematology

## 2018-06-02 DIAGNOSIS — N183 Chronic kidney disease, stage 3 unspecified: Secondary | ICD-10-CM

## 2018-06-02 DIAGNOSIS — C9110 Chronic lymphocytic leukemia of B-cell type not having achieved remission: Secondary | ICD-10-CM

## 2018-06-02 MED ORDER — PREDNISONE 20 MG PO TABS: 100.0000 mg | ORAL_TABLET | Freq: Every day | ORAL | 0 refills | Status: DC

## 2018-06-06 ENCOUNTER — Ambulatory Visit (HOSPITAL_COMMUNITY): Payer: Medicare Other | Admitting: Hematology

## 2018-06-06 ENCOUNTER — Encounter (HOSPITAL_COMMUNITY): Payer: Self-pay

## 2018-06-06 ENCOUNTER — Inpatient Hospital Stay (HOSPITAL_COMMUNITY): Payer: Medicare Other | Attending: Hematology

## 2018-06-06 ENCOUNTER — Inpatient Hospital Stay (HOSPITAL_COMMUNITY): Payer: Medicare Other

## 2018-06-06 ENCOUNTER — Other Ambulatory Visit (HOSPITAL_COMMUNITY): Payer: Medicare Other

## 2018-06-06 ENCOUNTER — Other Ambulatory Visit: Payer: Self-pay

## 2018-06-06 VITALS — BP 116/48 | HR 66 | Temp 97.7°F | Resp 18 | Wt 228.4 lb

## 2018-06-06 DIAGNOSIS — D801 Nonfamilial hypogammaglobulinemia: Secondary | ICD-10-CM | POA: Diagnosis not present

## 2018-06-06 DIAGNOSIS — D631 Anemia in chronic kidney disease: Secondary | ICD-10-CM | POA: Diagnosis not present

## 2018-06-06 DIAGNOSIS — N183 Chronic kidney disease, stage 3 unspecified: Secondary | ICD-10-CM

## 2018-06-06 DIAGNOSIS — D591 Autoimmune hemolytic anemia, unspecified: Secondary | ICD-10-CM

## 2018-06-06 DIAGNOSIS — C9110 Chronic lymphocytic leukemia of B-cell type not having achieved remission: Secondary | ICD-10-CM | POA: Insufficient documentation

## 2018-06-06 LAB — CBC WITH DIFFERENTIAL/PLATELET
BASOS PCT: 0 %
Basophils Absolute: 0.3 10*3/uL — ABNORMAL HIGH (ref 0.0–0.1)
EOS ABS: 0 10*3/uL (ref 0.0–0.7)
EOS PCT: 0 %
HEMATOCRIT: 37.3 % — AB (ref 39.0–52.0)
Hemoglobin: 10.4 g/dL — ABNORMAL LOW (ref 13.0–17.0)
Lymphocytes Relative: 93 %
Lymphs Abs: 130.7 10*3/uL (ref 0.7–4.0)
MCH: 33 pg (ref 26.0–34.0)
MCHC: 27.9 g/dL — AB (ref 30.0–36.0)
MCV: 118.4 fL — AB (ref 78.0–100.0)
MONOS PCT: 1 %
Monocytes Absolute: 1.4 10*3/uL (ref 0.1–1.0)
NEUTROS PCT: 6 %
Neutro Abs: 8.6 10*3/uL (ref 1.7–7.7)
PLATELETS: 134 10*3/uL — AB (ref 150–400)
RBC: 3.15 MIL/uL — ABNORMAL LOW (ref 4.22–5.81)
RDW: 16.3 % — ABNORMAL HIGH (ref 11.5–15.5)
WBC: 141 10*3/uL (ref 4.0–10.5)

## 2018-06-06 LAB — COMPREHENSIVE METABOLIC PANEL
ALT: 37 U/L (ref 0–44)
AST: 17 U/L (ref 15–41)
Albumin: 3.8 g/dL (ref 3.5–5.0)
Alkaline Phosphatase: 34 U/L — ABNORMAL LOW (ref 38–126)
Anion gap: 8 (ref 5–15)
BUN: 34 mg/dL — AB (ref 8–23)
CO2: 24 mmol/L (ref 22–32)
CREATININE: 1.43 mg/dL — AB (ref 0.61–1.24)
Calcium: 9.1 mg/dL (ref 8.9–10.3)
Chloride: 106 mmol/L (ref 98–111)
GFR calc non Af Amer: 49 mL/min — ABNORMAL LOW (ref >60)
GFR, EST AFRICAN AMERICAN: 57 mL/min — AB (ref >60)
Glucose, Bld: 218 mg/dL — ABNORMAL HIGH (ref 70–99)
Potassium: 6.1 mmol/L — ABNORMAL HIGH (ref 3.5–5.1)
SODIUM: 138 mmol/L (ref 135–145)
Total Bilirubin: 0.8 mg/dL (ref 0.3–1.2)
Total Protein: 5.8 g/dL — ABNORMAL LOW (ref 6.5–8.1)

## 2018-06-06 LAB — RETICULOCYTES
RBC.: 3.15 MIL/uL — ABNORMAL LOW (ref 4.22–5.81)
Retic Count, Absolute: 129.2 10*3/uL (ref 19.0–186.0)
Retic Ct Pct: 4.1 % — ABNORMAL HIGH (ref 0.4–3.1)

## 2018-06-06 LAB — LACTATE DEHYDROGENASE: LDH: 100 U/L (ref 98–192)

## 2018-06-06 MED ORDER — EPOETIN ALFA 20000 UNIT/ML IJ SOLN
20000.0000 [IU] | Freq: Once | INTRAMUSCULAR | Status: AC
  Administered 2018-06-06: 20000 [IU] via SUBCUTANEOUS

## 2018-06-06 MED ORDER — EPOETIN ALFA 40000 UNIT/ML IJ SOLN: 30000.0000 [IU] | Freq: Once | INTRAMUSCULAR | Status: DC

## 2018-06-06 MED ORDER — EPOETIN ALFA 20000 UNIT/ML IJ SOLN
INTRAMUSCULAR | Status: AC
  Filled 2018-06-06: qty 1

## 2018-06-06 MED ORDER — EPOETIN ALFA 10000 UNIT/ML IJ SOLN
INTRAMUSCULAR | Status: AC
  Filled 2018-06-06: qty 1

## 2018-06-06 MED ORDER — EPOETIN ALFA 10000 UNIT/ML IJ SOLN
10000.0000 [IU] | Freq: Once | INTRAMUSCULAR | Status: AC
  Administered 2018-06-06: 10000 [IU] via SUBCUTANEOUS

## 2018-06-06 NOTE — Patient Instructions (Signed)
[facility] at [facility] Discharge Instructions  Procrit given today Follow up as scheduled.   Thank you for choosing [facility] at [facility] to provide your oncology and hematology care.  To afford each patient quality time with our provider, please arrive at least 15 minutes before your scheduled appointment time.   If you have a lab appointment with the Cancer Center please come in thru the  Main Entrance and check in at the main information desk  You need to re-schedule your appointment should you arrive 10 or more minutes late.  We strive to give you quality time with our providers, and arriving late affects you and other patients whose appointments are after yours.  Also, if you no show three or more times for appointments you may be dismissed from the clinic at the providers discretion.     Again, thank you for choosing [facility].  Our hope is that these requests will decrease the amount of time that you wait before being seen by our physicians.       _____________________________________________________________  Should you have questions after your visit to [facility], please contact our office at [phone] between the hours of 8:30 a.m. and 4:30 p.m.  Voicemails left after 4:30 p.m. will not be returned until the following business day.  For prescription refill requests, have your pharmacy contact our office.       Resources For Cancer Patients and their Caregivers ? American Cancer Society: Can assist with transportation, wigs, general needs, runs Look Good Feel Better.        1-888-227-6333 ? Cancer Care: Provides financial assistance, online support groups, medication/co-pay assistance.  1-800-813-HOPE (4673) ? [facility] Assists [place] cancer patients and their families through emotional , educational and financial support.  336-427-4357 ? [place]  DSS Where to apply for food stamps, Medicaid and utility assistance. 336-342-1394 ? [facility]: Transportation to medical appointments. 336-347-2287 ? Social Security Administration: May apply for disability if have a Stage IV cancer. 336-342-7796 1-800-772-1213 ? [place] Aging, Disability and Transit Services: Assists with nutrition, care and transit needs. 336-349-2343  Cancer Center Support Programs:   > Cancer Support Group  2nd Tuesday of the month 1pm-2pm, Journey Room   > Creative Journey  3rd Tuesday of the month 1130am-1pm, Journey Room    

## 2018-06-06 NOTE — Progress Notes (Signed)
Hemoglobin 10.4 today. Will give 30,000 units of procrit per Dr. Delton Coombes. Will continue to monitor and see what dose to give depending on his hemoglobin per Dr. Delton Coombes.  Treatment given per orders. Patient tolerated it well without problems. Vitals stable and discharged home from clinic ambulatory. Follow up as scheduled.

## 2018-06-06 NOTE — Progress Notes (Signed)
CRITICAL VALUE ALERT Critical value received:  WBC 141 Date of notification:  06-06-2018 Time of notification: 14:00 Critical value read back:  Yes.   Nurse who received alert:  Venita Lick LPN MD notified (1st page):  Dr. Delton Coombes

## 2018-06-09 ENCOUNTER — Other Ambulatory Visit: Payer: Self-pay | Admitting: Family Medicine

## 2018-06-13 ENCOUNTER — Inpatient Hospital Stay (HOSPITAL_COMMUNITY): Payer: Medicare Other

## 2018-06-13 ENCOUNTER — Encounter (HOSPITAL_COMMUNITY): Payer: Self-pay

## 2018-06-13 ENCOUNTER — Other Ambulatory Visit: Payer: Self-pay

## 2018-06-13 VITALS — BP 151/51 | HR 64 | Temp 97.8°F | Resp 16 | Wt 237.0 lb

## 2018-06-13 DIAGNOSIS — N183 Chronic kidney disease, stage 3 unspecified: Secondary | ICD-10-CM

## 2018-06-13 DIAGNOSIS — D591 Autoimmune hemolytic anemia, unspecified: Secondary | ICD-10-CM

## 2018-06-13 DIAGNOSIS — C9110 Chronic lymphocytic leukemia of B-cell type not having achieved remission: Secondary | ICD-10-CM | POA: Diagnosis not present

## 2018-06-13 DIAGNOSIS — D801 Nonfamilial hypogammaglobulinemia: Secondary | ICD-10-CM | POA: Diagnosis not present

## 2018-06-13 DIAGNOSIS — D631 Anemia in chronic kidney disease: Secondary | ICD-10-CM | POA: Diagnosis not present

## 2018-06-13 LAB — CBC WITH DIFFERENTIAL/PLATELET
BASOS PCT: 0 %
Basophils Absolute: 0.2 10*3/uL — ABNORMAL HIGH (ref 0.0–0.1)
Eosinophils Absolute: 0.2 10*3/uL (ref 0.0–0.7)
Eosinophils Relative: 0 %
HCT: 35.8 % — ABNORMAL LOW (ref 39.0–52.0)
HEMOGLOBIN: 10.9 g/dL — AB (ref 13.0–17.0)
Lymphocytes Relative: 94 %
Lymphs Abs: 95.2 10*3/uL (ref 0.7–4.0)
MCH: 34.1 pg — AB (ref 26.0–34.0)
MCHC: 30.4 g/dL (ref 30.0–36.0)
MCV: 111.9 fL — ABNORMAL HIGH (ref 78.0–100.0)
Monocytes Absolute: 0.9 10*3/uL (ref 0.1–1.0)
Monocytes Relative: 1 %
NEUTROS ABS: 4.5 10*3/uL (ref 1.7–7.7)
Neutrophils Relative %: 5 %
PLATELETS: 93 10*3/uL — AB (ref 150–400)
RBC: 3.2 MIL/uL — ABNORMAL LOW (ref 4.22–5.81)
RDW: 15.1 % (ref 11.5–15.5)
WBC: 100.9 10*3/uL (ref 4.0–10.5)

## 2018-06-13 LAB — COMPREHENSIVE METABOLIC PANEL
ALBUMIN: 3.5 g/dL (ref 3.5–5.0)
ALK PHOS: 34 U/L — AB (ref 38–126)
ALT: 33 U/L (ref 0–44)
AST: 18 U/L (ref 15–41)
Anion gap: 6 (ref 5–15)
BUN: 33 mg/dL — AB (ref 8–23)
CO2: 28 mmol/L (ref 22–32)
CREATININE: 1.36 mg/dL — AB (ref 0.61–1.24)
Calcium: 8.7 mg/dL — ABNORMAL LOW (ref 8.9–10.3)
Chloride: 106 mmol/L (ref 98–111)
GFR calc Af Amer: >60 mL/min (ref >60)
GFR calc non Af Amer: 52 mL/min — ABNORMAL LOW (ref >60)
GLUCOSE: 188 mg/dL — AB (ref 70–99)
Potassium: 4.4 mmol/L (ref 3.5–5.1)
SODIUM: 140 mmol/L (ref 135–145)
Total Bilirubin: 0.8 mg/dL (ref 0.3–1.2)
Total Protein: 5.3 g/dL — ABNORMAL LOW (ref 6.5–8.1)

## 2018-06-13 MED ORDER — EPOETIN ALFA 10000 UNIT/ML IJ SOLN
10000.0000 [IU] | Freq: Once | INTRAMUSCULAR | Status: AC
  Administered 2018-06-13: 10000 [IU] via SUBCUTANEOUS

## 2018-06-13 MED ORDER — EPOETIN ALFA 20000 UNIT/ML IJ SOLN
20000.0000 [IU] | Freq: Once | INTRAMUSCULAR | Status: AC
  Administered 2018-06-13: 20000 [IU] via SUBCUTANEOUS

## 2018-06-13 MED ORDER — EPOETIN ALFA 10000 UNIT/ML IJ SOLN
INTRAMUSCULAR | Status: AC
  Filled 2018-06-13: qty 1

## 2018-06-13 MED ORDER — EPOETIN ALFA 20000 UNIT/ML IJ SOLN
INTRAMUSCULAR | Status: AC
  Filled 2018-06-13: qty 1

## 2018-06-13 NOTE — Progress Notes (Signed)
Hemoglobin 10.9 today. Will give 30, 000 of procrit today and instruct patient to decrease prednisone to 80,000 per Dr. Delton Coombes.  Treatment given per orders. Patient tolerated it well without problems. Vitals stable and discharged home from clinic ambulatory. Follow up as scheduled.

## 2018-06-13 NOTE — Progress Notes (Signed)
CRITICAL VALUE ALERT  Critical Value:  WBC 100.9  Date & Time Notied:  06/13/18 at [place]  Provider Notified: Dr. Delton Coombes  Orders Received/Actions taken: n/a

## 2018-06-13 NOTE — Patient Instructions (Signed)
[facility] at [facility] Discharge Instructions  Procrit given today Follow up as scheduled.   Thank you for choosing [facility] at [facility] to provide your oncology and hematology care.  To afford each patient quality time with our provider, please arrive at least 15 minutes before your scheduled appointment time.   If you have a lab appointment with the Cancer Center please come in thru the  Main Entrance and check in at the main information desk  You need to re-schedule your appointment should you arrive 10 or more minutes late.  We strive to give you quality time with our providers, and arriving late affects you and other patients whose appointments are after yours.  Also, if you no show three or more times for appointments you may be dismissed from the clinic at the providers discretion.     Again, thank you for choosing [facility].  Our hope is that these requests will decrease the amount of time that you wait before being seen by our physicians.       _____________________________________________________________  Should you have questions after your visit to [facility], please contact our office at [phone] between the hours of 8:30 a.m. and 4:30 p.m.  Voicemails left after 4:30 p.m. will not be returned until the following business day.  For prescription refill requests, have your pharmacy contact our office.       Resources For Cancer Patients and their Caregivers ? American Cancer Society: Can assist with transportation, wigs, general needs, runs Look Good Feel Better.        1-888-227-6333 ? Cancer Care: Provides financial assistance, online support groups, medication/co-pay assistance.  1-800-813-HOPE (4673) ? [facility] Assists [place] cancer patients and their families through emotional , educational and financial support.  336-427-4357 ? [place]  DSS Where to apply for food stamps, Medicaid and utility assistance. 336-342-1394 ? RC[facility]: Transportation to medical appointments. 336-347-2287 ? Social Security Administration: May apply for disability if have a Stage IV cancer. 336-342-7796 1-800-772-1213 ? [place] Aging, Disability and Transit Services: Assists with nutrition, care and transit needs. 336-349-2343  Cancer Center Support Programs:   > Cancer Support Group  2nd Tuesday of the month 1pm-2pm, Journey Room   > Creative Journey  3rd Tuesday of the month 1130am-1pm, Journey Room    

## 2018-06-16 ENCOUNTER — Encounter: Payer: Self-pay | Admitting: Family Medicine

## 2018-06-16 MED ORDER — HYDROCODONE-ACETAMINOPHEN 10-325 MG PO TABS: 1.0000 | ORAL_TABLET | Freq: Three times a day (TID) | ORAL | 0 refills | Status: DC | PRN

## 2018-06-16 NOTE — Telephone Encounter (Signed)
Will fill the July prescription Not sure why they need script before the 24th of July if not leaving until August 6th

## 2018-06-16 NOTE — Telephone Encounter (Signed)
Ok to refill??  Last office visit 5/3/219.  Last refill 05/16/2018.  Ok to fill additional paper prescription with post date of 07/16/2018?

## 2018-06-21 ENCOUNTER — Other Ambulatory Visit: Payer: Self-pay

## 2018-06-21 ENCOUNTER — Encounter (HOSPITAL_COMMUNITY): Payer: Self-pay | Admitting: Hematology

## 2018-06-21 ENCOUNTER — Inpatient Hospital Stay (HOSPITAL_COMMUNITY): Payer: Medicare Other

## 2018-06-21 ENCOUNTER — Inpatient Hospital Stay (HOSPITAL_BASED_OUTPATIENT_CLINIC_OR_DEPARTMENT_OTHER): Payer: Medicare Other | Admitting: Hematology

## 2018-06-21 DIAGNOSIS — C9110 Chronic lymphocytic leukemia of B-cell type not having achieved remission: Secondary | ICD-10-CM

## 2018-06-21 DIAGNOSIS — N183 Chronic kidney disease, stage 3 (moderate): Secondary | ICD-10-CM

## 2018-06-21 DIAGNOSIS — D591 Autoimmune hemolytic anemia, unspecified: Secondary | ICD-10-CM

## 2018-06-21 DIAGNOSIS — D801 Nonfamilial hypogammaglobulinemia: Secondary | ICD-10-CM | POA: Diagnosis not present

## 2018-06-21 DIAGNOSIS — D631 Anemia in chronic kidney disease: Secondary | ICD-10-CM | POA: Diagnosis not present

## 2018-06-21 LAB — CBC WITH DIFFERENTIAL/PLATELET
BASOS PCT: 0 %
Basophils Absolute: 0.1 10*3/uL (ref 0.0–0.1)
EOS ABS: 0.1 10*3/uL (ref 0.0–0.7)
EOS PCT: 0 %
HCT: 38.7 % — ABNORMAL LOW (ref 39.0–52.0)
Hemoglobin: 11.5 g/dL — ABNORMAL LOW (ref 13.0–17.0)
Lymphocytes Relative: 92 %
Lymphs Abs: 75.7 10*3/uL (ref 0.7–4.0)
MCH: 32.8 pg (ref 26.0–34.0)
MCHC: 29.7 g/dL — AB (ref 30.0–36.0)
MCV: 110.3 fL — AB (ref 78.0–100.0)
Monocytes Absolute: 1.2 10*3/uL (ref 0.1–1.0)
Monocytes Relative: 1 %
NEUTROS PCT: 7 %
Neutro Abs: 6.2 10*3/uL (ref 1.7–7.7)
PLATELETS: 127 10*3/uL — AB (ref 150–400)
RBC: 3.51 MIL/uL — AB (ref 4.22–5.81)
RDW: 14.9 % (ref 11.5–15.5)
WBC: 83.2 10*3/uL — AB (ref 4.0–10.5)

## 2018-06-21 LAB — COMPREHENSIVE METABOLIC PANEL
ALK PHOS: 33 U/L — AB (ref 38–126)
ALT: 22 U/L (ref 0–44)
AST: 14 U/L — AB (ref 15–41)
Albumin: 3.4 g/dL — ABNORMAL LOW (ref 3.5–5.0)
Anion gap: 7 (ref 5–15)
BILIRUBIN TOTAL: 0.8 mg/dL (ref 0.3–1.2)
BUN: 30 mg/dL — AB (ref 8–23)
CALCIUM: 8.9 mg/dL (ref 8.9–10.3)
CHLORIDE: 106 mmol/L (ref 98–111)
CO2: 26 mmol/L (ref 22–32)
Creatinine, Ser: 1.45 mg/dL — ABNORMAL HIGH (ref 0.61–1.24)
GFR, EST AFRICAN AMERICAN: 56 mL/min — AB (ref >60)
GFR, EST NON AFRICAN AMERICAN: 48 mL/min — AB (ref >60)
Glucose, Bld: 187 mg/dL — ABNORMAL HIGH (ref 70–99)
Potassium: 4.6 mmol/L (ref 3.5–5.1)
Sodium: 139 mmol/L (ref 135–145)
Total Protein: 5.7 g/dL — ABNORMAL LOW (ref 6.5–8.1)

## 2018-06-21 LAB — LACTATE DEHYDROGENASE: LDH: 122 U/L (ref 98–192)

## 2018-06-21 LAB — RETICULOCYTES
RBC.: 3.51 MIL/uL — ABNORMAL LOW (ref 4.22–5.81)
RETIC CT PCT: 2.9 % (ref 0.4–3.1)
Retic Count, Absolute: 101.8 10*3/uL (ref 19.0–186.0)

## 2018-06-21 NOTE — Progress Notes (Signed)
Patient does not need epogen today due to hgb of 11.5. Patient notified during office visit.

## 2018-06-21 NOTE — Progress Notes (Signed)
[name] Ball, [place] [id number]   CLINIC:  Medical Oncology/Hematology  PCP:  [name] Rossetti, MD [address] [place] [address] [phone]   REASON FOR VISIT:  Follow-up for autoimmune hemolytic anemia and CLL  CURRENT THERAPY: cut prednisone to 60 mg daily and procrit weekly   INTERVAL HISTORY:  [name] Ball 67 y.o. male returns for routine follow-up autoimmune hemolytic anemia and CLL. Patient is here today with his wife. Patient is going to [place] for 3 weeks on 07/11/18-07/30/18. He has been doing great. He has had more energy and been doing more around the house. Patient appetite was was decreased while on the prednisone, however since the steroids have decreased his taste his starting to come back. Patient denies any new pains. Denies any nausea, vomiting, diarrhea. Denies any fevers or infections recently.  Marland Kitchen     REVIEW OF SYSTEMS:  Review of Systems  Constitutional: Positive for fatigue.  HENT:   Positive for mouth sores and trouble swallowing.   Eyes: Negative.   Respiratory: Negative.   Cardiovascular: Negative.   Gastrointestinal: Positive for diarrhea and nausea.  Endocrine: Negative.   Genitourinary: Negative.    Skin: Negative.   Neurological: Positive for dizziness and extremity weakness.  Hematological: Bruises/bleeds easily.  Psychiatric/Behavioral: Negative.      PAST MEDICAL/SURGICAL HISTORY:  Past Medical History:  Diagnosis Date  . Allergy   . Arthritis   . Asthma   . Cancer ([facility])    kidney  . CLL (chronic lymphocytic leukemia) ([facility]) 2016  . COPD (chronic obstructive pulmonary disease) ([place])   . Diverticulitis   . Diverticulosis    colonoscopy 3/15  . GERD (gastroesophageal reflux disease)   . Hypertension   . Low testosterone   . Migraine   . Shingles 03/31/2016  . Solar purpura ([facility])   . Thyroid disease    hypothryoid  . Ulcer   . Ventral hernia FEB 2015 CT    Large infra  umbilical ventral hernia containing loops of small   Past Surgical History:  Procedure Laterality Date  . abdominal mesh inserted  june 2006, may 2008,dec 2010  . CHOLECYSTECTOMY  june 2007  . COLON SURGERY  sept 2007  . COLONOSCOPY    . COLONOSCOPY N/A 02/20/2014   Procedure: COLONOSCOPY;  Surgeon: Danie Binder, MD;  Location: [facility];  Service: Endoscopy;  Laterality: N/A;  . HERNIA REPAIR  05/2005 5/20108, 11/2009   with mesh  . KIDNEY CYST REMOVAL  jan 2005   renal cell carcinoma  . knee minescus repair Left feb 2007  . NEPHRECTOMY Left 12/2003     SOCIAL HISTORY:  Social History   Socioeconomic History  . Marital status: Married    Spouse name: [name] Ball  . Number of children: 4  . Years of education: 17  . Highest education level: Not on file  Occupational History    Comment: retired  Scientific laboratory technician  . Financial resource strain: Not on file  . Food insecurity:    Worry: Not on file    Inability: Not on file  . Transportation needs:    Medical: Not on file    Non-medical: Not on file  Tobacco Use  . Smoking status: Former Smoker    Last attempt to quit: 12/06/1996    Years since quitting: 21.5  . Smokeless tobacco: Never Used  Substance and Sexual Activity  . Alcohol use: Yes    Comment:  a few shots a few times a month  . Drug use: No  . Sexual activity: Not on file  Lifestyle  . Physical activity:    Days per week: Not on file    Minutes per session: Not on file  . Stress: Not on file  Relationships  . Social connections:    Talks on phone: Not on file    Gets together: Not on file    Attends religious service: Not on file    Active member of club or organization: Not on file    Attends meetings of clubs or organizations: Not on file    Relationship status: Not on file  . Intimate partner violence:    Fear of current or ex partner: Not on file    Emotionally abused: Not on file    Physically abused: Not on file    Forced sexual activity: Not on file    Other Topics Concern  . 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   Lives at home with wife, daughter, family   Caffeine- sodas, 2 daily    FAMILY HISTORY:  Family History  Problem Relation Age of Onset  . Cancer Mother        lung, met to brain  . Diabetes Mellitus II Sister   . Colon cancer Neg Hx     CURRENT MEDICATIONS:  Outpatient Encounter Medications as of 06/21/2018  Medication Sig  . acetaminophen (TYLENOL) 325 MG tablet Take 650 mg by mouth every 6 (six) hours as needed.  Marland Kitchen albuterol (PROVENTIL) (2.5 MG/3ML) 0.083% nebulizer solution TAKE 3MLS(1 VIAL) VIA NEBULIZER EVERY 6 HOURS AS NEEDED FOR WHEEZING OR SHORTNESS OF BREATH  . allopurinol (ZYLOPRIM) 100 MG tablet TAKE 1 TABLET(100 MG) BY MOUTH AT BEDTIME  . amLODipine (NORVASC) 10 MG tablet TAKE 1 TABLET BY MOUTH DAILY  . ATROVENT HFA 17 MCG/ACT inhaler INHALE 2 PUFFS BY MOUTH INTO THE LUNGS EVERY 6 HOURS  . calcium carbonate (TUMS - DOSED IN MG ELEMENTAL CALCIUM) 500 MG chewable tablet Chew 2-3 tablets by mouth once as needed for indigestion or heartburn. Reported on 11/21/2015  . citalopram (CELEXA) 10 MG tablet TAKE 1 TABLET(10 MG) BY MOUTH DAILY  . clobetasol cream (TEMOVATE) 1.63 % Apply 1 application topically 2 (two) times daily.  . clopidogrel (PLAVIX) 75 MG tablet TAKE 1 TABLET(75 MG) BY MOUTH DAILY  . diphenhydrAMINE (BENADRYL) 25 MG tablet Take 25 mg by mouth every evening.   . folic acid (FOLVITE) 1 MG tablet Take 1 tablet (1 mg total) by mouth daily.  . Glucosamine-Chondroit-Vit C-Mn (GLUCOSAMINE 1500 COMPLEX) CAPS Take 1,200 capsules by mouth 2 (two) times daily.  Marland Kitchen HYDROcodone-acetaminophen (NORCO) 10-325 MG tablet Take 1-2 tablets by mouth every 8 (eight) hours as needed.  . levalbuterol (XOPENEX HFA) 45 MCG/ACT inhaler Inhale 1-2 puffs into the lungs every 4 (four) hours as needed for wheezing.  Marland Kitchen levothyroxine (SYNTHROID, LEVOTHROID) 50 MCG tablet  TAKE 1 TABLET(50 MCG) BY MOUTH DAILY BEFORE BREAKFAST  . loratadine (CLARITIN) 10 MG tablet Take 10 mg by mouth every morning.   . metoprolol tartrate (LOPRESSOR) 50 MG tablet TAKE 1 TABLET(50 MG) BY MOUTH TWICE DAILY  . mometasone (ASMANEX 60 METERED DOSES) 220 MCG/INH inhaler Inhale 2 puffs into the lungs 2 (two) times daily.  . Multiple Vitamin (MULTIVITAMIN WITH MINERALS) TABS tablet Take 1 tablet by mouth daily.  . Omega-3 Fatty Acids (FISH OIL) 1000  MG CPDR Take by mouth 2 (two) times daily.  . ondansetron (ZOFRAN) 4 MG tablet Take 1 tablet (4 mg total) by mouth every 8 (eight) hours as needed for nausea or vomiting.  . predniSONE (DELTASONE) 20 MG tablet Take 5 tablets (100 mg total) by mouth daily with breakfast.  . ranitidine (ZANTAC) 75 MG tablet Take 75 mg by mouth 2 (two) times daily.  . vitamin B-12 (CYANOCOBALAMIN) 1000 MCG tablet Take 1,000 mcg by mouth every morning.   . vitamin C (ASCORBIC ACID) 500 MG tablet Take 500 mg by mouth 2 (two) times daily.  . vitamin E 400 UNIT capsule Take 400 Units by mouth every morning.    No facility-administered encounter medications on file as of 06/21/2018.     ALLERGIES:  Allergies  Allergen Reactions  . Contrast Media [Iodinated Diagnostic Agents]     Single functioning kidney  . Other Hives    Neoprene fabric Strawberry   . Ether Rash    Took a long time to wake up Doesn't wake up from it     PHYSICAL EXAM:  ECOG Performance status: 1  Vitals:   06/21/18 1044  BP: (!) 158/68  Pulse: 72  Resp: 18  Temp: 98.1 F (36.7 C)  SpO2: 94%   Filed Weights   06/21/18 1044  Weight: 239 lb 6.4 oz (108.6 kg)    Physical Exam   LABORATORY DATA:  I have reviewed the labs as listed.  CBC    Component Value Date/Time   WBC 83.2 (HH) 06/21/2018 0951   RBC 3.51 (L) 06/21/2018 0951   RBC 3.51 (L) 06/21/2018 0951   HGB 11.5 (L) 06/21/2018 0951   HCT 38.7 (L) 06/21/2018 0951   PLT 127 (L) 06/21/2018 0951   MCV 110.3 (H)  06/21/2018 0951   MCH 32.8 06/21/2018 0951   MCHC 29.7 (L) 06/21/2018 0951   RDW 14.9 06/21/2018 0951   LYMPHSABS 75.7 06/21/2018 0951   MONOABS 1.2 06/21/2018 0951   EOSABS 0.1 06/21/2018 0951   BASOSABS 0.1 06/21/2018 0951   CMP Latest Ref Rng & Units 06/21/2018 06/13/2018 06/06/2018  Glucose 70 - 99 mg/dL 187(H) 188(H) 218(H)  BUN 8 - 23 mg/dL 30(H) 33(H) 34(H)  Creatinine 0.61 - 1.24 mg/dL 1.45(H) 1.36(H) 1.43(H)  Sodium 135 - 145 mmol/L 139 140 138  Potassium 3.5 - 5.1 mmol/L 4.6 4.4 6.1(H)  Chloride 98 - 111 mmol/L 106 106 106  CO2 22 - 32 mmol/L _0 Calcium 8.9 - 10.3 mg/dL 8.9 8.7(L) 9.1  Total Protein 6.5 - 8.1 g/dL 5.7(L) 5.3(L) 5.8(L)  Total Bilirubin 0.3 - 1.2 mg/dL 0.8 0.8 0.8  Alkaline Phos 38 - 126 U/L 33(L) 34(L) 34(L)  AST 15 - 41 U/L 14(L) 18 17  ALT 0 - 44 U/L 22 33 37          ASSESSMENT & PLAN:   CLL (chronic lymphocytic leukemia) ([facility]) 1.  Autoimmune hemolytic anemia:  -He developed autoimmune hemolytic anemia as a complication of CLL.  Direct Coombs test was positive for IgG and Cd3.  We have started him on prednisone 1 mg/kg rounded of to 100 mg daily on 05/19/2018.  He is tolerating it very well. 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  His LDH has normalized.  We have cut back on his Procrit to 30,000 units on 06/07/2018.  We have also tapered his prednisone to 80 mg daily on  06/14/2018. -Today his hemoglobin has improved to 11.5.  Reticulocyte count and LDH normalized.  I will taper his prednisone further to 60 mg daily.  He is planning to go to [facility] in 3 weeks and will stay there for 3 weeks.  We will stabilize his hemoglobin prior to his trip.  We will monitor his counts closely every week.  He will continue folic acid daily.  We will continue using Procrit as needed.  2.  Hypogammaglobulinemia: Despite his IgG levels in the 400 range, he does not have any recurrent infections requiring  antibiotics.  He was treated with IVIG in February 2017, which was discontinued secondary to high cost.  3.  Stage III CKD: He had a left nephrectomy for renal cell carcinoma in 2005.  He continues to be in remission.  His creatinine is stable.  He is receiving Procrit weekly for combination anemia from CKD and hemolysis.  4.  ID prophylaxis: - As his prednisone will be tapered over the next several months, I will start him on Bactrim DS 1 tablet Monday, Wednesday and Friday for PCP prophylaxis.      Orders placed this encounter:  No orders of the defined types were placed in this encounter.     Derek Jack, MD [place] [phone]

## 2018-06-21 NOTE — Assessment & Plan Note (Signed)
1.  Autoimmune hemolytic anemia:  -He developed autoimmune hemolytic anemia as a complication of CLL.  Direct Coombs test was positive for IgG and Cd3.  We have started him on prednisone 1 mg/kg rounded of to 100 mg daily on 05/19/2018.  He is tolerating it very well. 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  His LDH has normalized.  We have cut back on his Procrit to 30,000 units on 06/07/2018.  We have also tapered his prednisone to 80 mg daily on 06/14/2018. -Today his hemoglobin has improved to 11.5.  Reticulocyte count and LDH normalized.  I will taper his prednisone further to 60 mg daily.  He is planning to go to [facility] in 3 weeks and will stay there for 3 weeks.  We will stabilize his hemoglobin prior to his trip.  We will monitor his counts closely every week.  He will continue folic acid daily.  We will continue using Procrit as needed.  2.  Hypogammaglobulinemia: Despite his IgG levels in the 400 range, he does not have any recurrent infections requiring antibiotics.  He was treated with IVIG in February 2017, which was discontinued secondary to high cost.  3.  Stage III CKD: He had a left nephrectomy for renal cell carcinoma in 2005.  He continues to be in remission.  His creatinine is stable.  He is receiving Procrit weekly for combination anemia from CKD and hemolysis.  4.  ID prophylaxis: - As his prednisone will be tapered over the next several months, I will start him on Bactrim DS 1 tablet Monday, Wednesday and Friday for PCP prophylaxis.

## 2018-06-21 NOTE — Progress Notes (Signed)
Aranesp held today for hemoglobin 11.5

## 2018-06-21 NOTE — Progress Notes (Signed)
CRITICAL VALUE ALERT Critical value received:  WBC-83.2 Date of notification:  06/21/18 Time of notification: 4619 Critical value read back:  Yes.   Nurse who received alert:  M.Mykelle Cockerell,LPN MD notified (1st page):  S.Katragadda,MD

## 2018-06-23 ENCOUNTER — Other Ambulatory Visit (HOSPITAL_COMMUNITY): Payer: Self-pay | Admitting: *Deleted

## 2018-06-23 MED ORDER — SULFAMETHOXAZOLE-TRIMETHOPRIM 800-160 MG PO TABS: 1.0000 | ORAL_TABLET | ORAL | 1 refills | Status: DC

## 2018-06-26 ENCOUNTER — Other Ambulatory Visit (HOSPITAL_COMMUNITY): Payer: Self-pay

## 2018-06-26 ENCOUNTER — Other Ambulatory Visit: Payer: Self-pay | Admitting: Family Medicine

## 2018-06-26 DIAGNOSIS — N183 Chronic kidney disease, stage 3 unspecified: Secondary | ICD-10-CM

## 2018-06-26 DIAGNOSIS — C9110 Chronic lymphocytic leukemia of B-cell type not having achieved remission: Secondary | ICD-10-CM

## 2018-06-27 ENCOUNTER — Inpatient Hospital Stay (HOSPITAL_COMMUNITY): Payer: Medicare Other

## 2018-06-27 DIAGNOSIS — N183 Chronic kidney disease, stage 3 unspecified: Secondary | ICD-10-CM

## 2018-06-27 DIAGNOSIS — D801 Nonfamilial hypogammaglobulinemia: Secondary | ICD-10-CM | POA: Diagnosis not present

## 2018-06-27 DIAGNOSIS — D631 Anemia in chronic kidney disease: Secondary | ICD-10-CM | POA: Diagnosis not present

## 2018-06-27 DIAGNOSIS — C9110 Chronic lymphocytic leukemia of B-cell type not having achieved remission: Secondary | ICD-10-CM

## 2018-06-27 DIAGNOSIS — D591 Other autoimmune hemolytic anemias: Secondary | ICD-10-CM | POA: Diagnosis not present

## 2018-06-27 LAB — CBC WITH DIFFERENTIAL/PLATELET
Basophils Absolute: 0.3 10*3/uL (ref 0.0–0.1)
Basophils Relative: 0 %
EOS PCT: 0 %
Eosinophils Absolute: 0.3 10*3/uL (ref 0.0–0.7)
HCT: 41.4 % (ref 39.0–52.0)
Hemoglobin: 12.2 g/dL — ABNORMAL LOW (ref 13.0–17.0)
Lymphocytes Relative: 92 %
Lymphs Abs: 96.9 10*3/uL (ref 0.7–4.0)
MCH: 32 pg (ref 26.0–34.0)
MCHC: 29.5 g/dL — AB (ref 30.0–36.0)
MCV: 108.7 fL — ABNORMAL HIGH (ref 78.0–100.0)
MONO ABS: 3 10*3/uL (ref 0.1–1.0)
MONOS PCT: 3 %
Neutro Abs: 5.6 10*3/uL (ref 1.7–7.7)
Neutrophils Relative %: 5 %
Platelets: 181 10*3/uL (ref 150–400)
RBC: 3.81 MIL/uL — ABNORMAL LOW (ref 4.22–5.81)
RDW: 15 % (ref 11.5–15.5)
WBC: 106 10*3/uL (ref 4.0–10.5)

## 2018-06-27 MED ORDER — PREDNISONE 20 MG PO TABS: 60.0000 mg | ORAL_TABLET | Freq: Every day | ORAL | 4 refills | Status: DC

## 2018-06-27 NOTE — Addendum Note (Signed)
Addended by: Farley Ly on: 06/27/2018 11:18 AM   Modules accepted: Orders

## 2018-06-27 NOTE — Progress Notes (Signed)
CRITICAL VALUE STICKER  CRITICAL VALUE: WBC 106  RECEIVER (on-site recipient of call): Charlyne Petrin, rn  DATE & TIME NOTIFIED: 06/27/2018 at 1044  MESSENGER (representative from lab): Hillary lab tech  MD NOTIFIED: Delton Coombes  TIME OF NOTIFICATION: 1062  RESPONSE:

## 2018-06-27 NOTE — Progress Notes (Signed)
Pt here today for Procrit injection. Pt doesn't need injection today due to Hgb being 12.2. Dr. Delton Coombes aware of Hgb and WBCs, he advised that the pt stay on current dose of prednisone until he returns from [place].   I informed the pt and his wife of above and will send refill of prednisone to the pt's pharmacy.

## 2018-06-30 ENCOUNTER — Other Ambulatory Visit (HOSPITAL_COMMUNITY): Payer: Self-pay

## 2018-06-30 DIAGNOSIS — C9110 Chronic lymphocytic leukemia of B-cell type not having achieved remission: Secondary | ICD-10-CM

## 2018-06-30 DIAGNOSIS — N183 Chronic kidney disease, stage 3 unspecified: Secondary | ICD-10-CM

## 2018-07-03 ENCOUNTER — Encounter: Payer: Self-pay | Admitting: Family Medicine

## 2018-07-03 MED ORDER — HYDROCODONE-ACETAMINOPHEN 10-325 MG PO TABS: 1.0000 | ORAL_TABLET | Freq: Three times a day (TID) | ORAL | 0 refills | Status: DC | PRN

## 2018-07-03 NOTE — Telephone Encounter (Signed)
Ok to refill with printed prescription?

## 2018-07-04 ENCOUNTER — Inpatient Hospital Stay (HOSPITAL_COMMUNITY): Payer: Medicare Other

## 2018-07-04 DIAGNOSIS — N183 Chronic kidney disease, stage 3 unspecified: Secondary | ICD-10-CM

## 2018-07-04 DIAGNOSIS — D591 Other autoimmune hemolytic anemias: Secondary | ICD-10-CM | POA: Diagnosis not present

## 2018-07-04 DIAGNOSIS — C9110 Chronic lymphocytic leukemia of B-cell type not having achieved remission: Secondary | ICD-10-CM | POA: Diagnosis not present

## 2018-07-04 DIAGNOSIS — D801 Nonfamilial hypogammaglobulinemia: Secondary | ICD-10-CM | POA: Diagnosis not present

## 2018-07-04 DIAGNOSIS — D631 Anemia in chronic kidney disease: Secondary | ICD-10-CM | POA: Diagnosis not present

## 2018-07-04 LAB — CBC WITH DIFFERENTIAL/PLATELET
BLASTS: 0 %
Band Neutrophils: 0 %
Basophils Absolute: 0 10*3/uL (ref 0.0–0.1)
Basophils Relative: 0 %
Eosinophils Absolute: 0 10*3/uL (ref 0.0–0.7)
Eosinophils Relative: 0 %
HEMATOCRIT: 40.5 % (ref 39.0–52.0)
HEMOGLOBIN: 12 g/dL — AB (ref 13.0–17.0)
Lymphocytes Relative: 95 %
Lymphs Abs: 110.7 10*3/uL — ABNORMAL HIGH (ref 0.7–4.0)
MCH: 31.3 pg (ref 26.0–34.0)
MCHC: 29.6 g/dL — ABNORMAL LOW (ref 30.0–36.0)
MCV: 105.7 fL — AB (ref 78.0–100.0)
METAMYELOCYTES PCT: 0 %
MONOS PCT: 2 %
MYELOCYTES: 0 %
Monocytes Absolute: 2.3 10*3/uL — ABNORMAL HIGH (ref 0.1–1.0)
NEUTROS ABS: 3.5 10*3/uL (ref 1.7–7.7)
Neutrophils Relative %: 3 %
Other: 0 %
PROMYELOCYTES RELATIVE: 0 %
Platelets: 192 10*3/uL (ref 150–400)
RBC: 3.83 MIL/uL — AB (ref 4.22–5.81)
RDW: 14.9 % (ref 11.5–15.5)
WBC: 116.5 10*3/uL — AB (ref 4.0–10.5)
nRBC: 0 /100 WBC

## 2018-07-04 NOTE — Progress Notes (Signed)
Patient did not need ESA today due to hgb being 12. Copy of labs given to patient.   CRITICAL VALUE ALERT Critical value received:  WBC 116.5 Date of notification:  07/04/18 Time of notification: 4403 Critical value read back:  Yes.   Nurse who received alert:  Venita Lick LPN Dr. Delton Coombes notified.

## 2018-07-06 DIAGNOSIS — G4733 Obstructive sleep apnea (adult) (pediatric): Secondary | ICD-10-CM | POA: Diagnosis not present

## 2018-07-10 ENCOUNTER — Inpatient Hospital Stay (HOSPITAL_COMMUNITY): Payer: Medicare Other

## 2018-07-10 ENCOUNTER — Inpatient Hospital Stay (HOSPITAL_COMMUNITY): Payer: Medicare Other | Attending: Hematology

## 2018-07-10 DIAGNOSIS — D591 Other autoimmune hemolytic anemias: Secondary | ICD-10-CM | POA: Insufficient documentation

## 2018-07-10 DIAGNOSIS — N183 Chronic kidney disease, stage 3 unspecified: Secondary | ICD-10-CM

## 2018-07-10 DIAGNOSIS — C9110 Chronic lymphocytic leukemia of B-cell type not having achieved remission: Secondary | ICD-10-CM | POA: Diagnosis not present

## 2018-07-10 DIAGNOSIS — D801 Nonfamilial hypogammaglobulinemia: Secondary | ICD-10-CM | POA: Diagnosis not present

## 2018-07-10 LAB — CBC WITH DIFFERENTIAL/PLATELET
BASOS ABS: 0 10*3/uL (ref 0.0–0.1)
BLASTS: 0 %
Band Neutrophils: 0 %
Basophils Relative: 0 %
Eosinophils Absolute: 0 10*3/uL (ref 0.0–0.7)
Eosinophils Relative: 0 %
HCT: 39.3 % (ref 39.0–52.0)
Hemoglobin: 11.7 g/dL — ABNORMAL LOW (ref 13.0–17.0)
Lymphocytes Relative: 95 %
Lymphs Abs: 101.1 10*3/uL — ABNORMAL HIGH (ref 0.7–4.0)
MCH: 31.3 pg (ref 26.0–34.0)
MCHC: 29.8 g/dL — ABNORMAL LOW (ref 30.0–36.0)
MCV: 105.1 fL — AB (ref 78.0–100.0)
METAMYELOCYTES PCT: 0 %
Monocytes Absolute: 2.1 10*3/uL — ABNORMAL HIGH (ref 0.1–1.0)
Monocytes Relative: 2 %
Myelocytes: 0 %
Neutro Abs: 3.2 10*3/uL (ref 1.7–7.7)
Neutrophils Relative %: 3 %
Other: 0 %
PLATELETS: 136 10*3/uL — AB (ref 150–400)
Promyelocytes Relative: 0 %
RBC: 3.74 MIL/uL — AB (ref 4.22–5.81)
RDW: 14.9 % (ref 11.5–15.5)
WBC: 106.4 10*3/uL — AB (ref 4.0–10.5)
nRBC: 0 /100 WBC

## 2018-07-10 NOTE — Progress Notes (Signed)
CRITICAL VALUE ALERT Critical value received:  WBC 106.4 Date of notification:  07-10-18 Time of notification: 5885 Critical value read back:  Yes.   Nurse who received alert:  C.page RN MD notified (1st page):  Dr. Delton Coombes

## 2018-07-10 NOTE — Progress Notes (Signed)
Did not need ESA today due to HGB of 11.7. Patient's labs given to patient. Patient continues to take Prednisone.

## 2018-07-11 ENCOUNTER — Other Ambulatory Visit (HOSPITAL_COMMUNITY): Payer: Medicare Other

## 2018-07-11 ENCOUNTER — Ambulatory Visit (HOSPITAL_COMMUNITY): Payer: Medicare Other

## 2018-07-11 MED ORDER — FULVESTRANT 250 MG/5ML IM SOLN
INTRAMUSCULAR | Status: AC
  Filled 2018-07-11: qty 5

## 2018-07-11 MED ORDER — OCTREOTIDE ACETATE 30 MG IM KIT
PACK | INTRAMUSCULAR | Status: AC
  Filled 2018-07-11: qty 1

## 2018-07-12 MED ORDER — OCTREOTIDE ACETATE 30 MG IM KIT
PACK | INTRAMUSCULAR | Status: AC
  Filled 2018-07-12: qty 1

## 2018-07-18 ENCOUNTER — Ambulatory Visit (HOSPITAL_COMMUNITY): Payer: Medicare Other

## 2018-07-18 ENCOUNTER — Other Ambulatory Visit (HOSPITAL_COMMUNITY): Payer: Medicare Other

## 2018-07-25 ENCOUNTER — Other Ambulatory Visit (HOSPITAL_COMMUNITY): Payer: Medicare Other

## 2018-07-25 ENCOUNTER — Ambulatory Visit (HOSPITAL_COMMUNITY): Payer: Medicare Other

## 2018-08-01 ENCOUNTER — Ambulatory Visit: Payer: Medicare Other | Admitting: Family Medicine

## 2018-08-02 ENCOUNTER — Other Ambulatory Visit: Payer: Self-pay

## 2018-08-02 ENCOUNTER — Inpatient Hospital Stay (HOSPITAL_BASED_OUTPATIENT_CLINIC_OR_DEPARTMENT_OTHER): Payer: Medicare Other | Admitting: Hematology

## 2018-08-02 ENCOUNTER — Inpatient Hospital Stay (HOSPITAL_COMMUNITY): Payer: Medicare Other

## 2018-08-02 ENCOUNTER — Encounter (HOSPITAL_COMMUNITY): Payer: Self-pay | Admitting: Hematology

## 2018-08-02 VITALS — BP 127/54 | HR 72 | Temp 98.1°F | Resp 20 | Wt 246.0 lb

## 2018-08-02 DIAGNOSIS — N183 Chronic kidney disease, stage 3 unspecified: Secondary | ICD-10-CM

## 2018-08-02 DIAGNOSIS — C9110 Chronic lymphocytic leukemia of B-cell type not having achieved remission: Secondary | ICD-10-CM

## 2018-08-02 DIAGNOSIS — D591 Autoimmune hemolytic anemia, unspecified: Secondary | ICD-10-CM

## 2018-08-02 DIAGNOSIS — D801 Nonfamilial hypogammaglobulinemia: Secondary | ICD-10-CM

## 2018-08-02 LAB — CBC WITH DIFFERENTIAL/PLATELET
BAND NEUTROPHILS: 0 %
BLASTS: 0 %
Basophils Absolute: 0 10*3/uL (ref 0.0–0.1)
Basophils Relative: 0 %
EOS ABS: 0 10*3/uL (ref 0.0–0.7)
Eosinophils Relative: 0 %
HEMATOCRIT: 39.4 % (ref 39.0–52.0)
Hemoglobin: 12 g/dL — ABNORMAL LOW (ref 13.0–17.0)
LYMPHS PCT: 98 %
Lymphs Abs: 95.6 10*3/uL — ABNORMAL HIGH (ref 0.7–4.0)
MCH: 31.7 pg (ref 26.0–34.0)
MCHC: 30.5 g/dL (ref 30.0–36.0)
MCV: 104 fL — ABNORMAL HIGH (ref 78.0–100.0)
MONOS PCT: 0 %
Metamyelocytes Relative: 0 %
Monocytes Absolute: 0 10*3/uL — ABNORMAL LOW (ref 0.1–1.0)
Myelocytes: 0 %
NEUTROS ABS: 2 10*3/uL (ref 1.7–7.7)
Neutrophils Relative %: 2 %
OTHER: 0 %
Platelets: 134 10*3/uL — ABNORMAL LOW (ref 150–400)
Promyelocytes Relative: 0 %
RBC: 3.79 MIL/uL — AB (ref 4.22–5.81)
RDW: 15.6 % — AB (ref 11.5–15.5)
WBC: 97.6 10*3/uL (ref 4.0–10.5)
nRBC: 0 /100 WBC

## 2018-08-02 LAB — RETICULOCYTES
RBC.: 3.79 MIL/uL — ABNORMAL LOW (ref 4.22–5.81)
RETIC COUNT ABSOLUTE: 49.3 10*3/uL (ref 19.0–186.0)
Retic Ct Pct: 1.3 % (ref 0.4–3.1)

## 2018-08-02 LAB — LACTATE DEHYDROGENASE: LDH: 155 U/L (ref 98–192)

## 2018-08-02 NOTE — Assessment & Plan Note (Signed)
1.  Autoimmune hemolytic anemia:  -He developed autoimmune hemolytic anemia as a complication of CLL.  Direct Coombs test was positive for IgG and Cd3.  We have started him on prednisone 1 mg/kg rounded of to 100 mg daily on 05/19/2018.  He is tolerating it very well. 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  His LDH has normalized.  We have cut back on his Procrit to 30,000 units on 06/07/2018.  We have also tapered his prednisone to 80 mg daily on 06/14/2018. - His prednisone dose was reduced to 60 mg on 06/21/2018.  He went on a trip to [place] for last 4 weeks.  He did not feel any severe fatigue. - He complained of some shakiness in his hands.  I have reviewed his blood count today.  Hemoglobin is around 12.  He does not require any Procrit.  White count was 97.6.  Platelet count 134.  Reticulocyte count was 1.3%.  LDH was normal at 155. -I have recommended cutting back on the dose of prednisone to 40 mg daily.  He will continue folic acid daily. -I have also discussed the possibility of starting him on treatment for his CLL if any further worsening of his hemolytic anemia. - I will reevaluate him in 4 to 5 weeks.  2.  Hypogammaglobulinemia: Despite his IgG levels in the 400 range, he does not have any recurrent infections requiring antibiotics.  He was treated with IVIG in February 2017, which was discontinued secondary to high cost.  3.  Stage III CKD: He had a left nephrectomy for renal cell carcinoma in 2005.  He continues to be in remission.  His kidney function has been stable.  He is continuing to receive Procrit as needed for CKD and hemolysis.   4.  ID prophylaxis: -  He will continue Bactrim DS 1 tablet daily on Monday, Wednesday and Friday for PCP prophylaxis.

## 2018-08-02 NOTE — Progress Notes (Signed)
[name], [name] Ball   CLINIC:  Medical Oncology/Hematology  PCP:  [name] Rossetti, MD [address] [place] [address] [phone]   REASON FOR VISIT:  Follow-up for autoimmune hemolytic anemia and CLL  CURRENT THERAPY: prednisone decreased to 40 mg daily and procrit   INTERVAL HISTORY:  [name] Ball 67 y.o. male returns for routine follow-up for his hemolytic anemia and CLL. Patient is here today with his wife. He is doing well and had a great vacation to [place]. He had no issues while he was out [place]. He is having some hand tremors related to the steroid use. He has a sore throat but has no problems eating. His appetite is 100%. His energy levels are at 50%. He still has the numbness in his fingers that is stable at this time. His legs are a little more swollen than last visit. He is trying to remain active at home during the day. Patient denies any nausea, vomiting, or diarrhea. Denies any headaches. Denies any fevers or recent infections.     REVIEW OF SYSTEMS:  Review of Systems  Constitutional: Positive for chills and fatigue.  HENT:   Positive for mouth sores and trouble swallowing.   Neurological: Positive for dizziness, extremity weakness and numbness.  All other systems reviewed and are negative.    PAST MEDICAL/SURGICAL HISTORY:  Past Medical History:  Diagnosis Date  . Allergy   . Arthritis   . Asthma   . Cancer ([facility])    kidney  . CLL (chronic lymphocytic leukemia) ([facility]) 2016  . COPD (chronic obstructive pulmonary disease) ([facility])   . Diverticulitis   . Diverticulosis    colonoscopy 3/15  . GERD (gastroesophageal reflux disease)   . Hypertension   . Low testosterone   . Migraine   . Shingles 03/31/2016  . Solar purpura ([facility])   . Thyroid disease    hypothryoid  . Ulcer   . Ventral hernia FEB 2015 CT    Large infra umbilical ventral hernia containing loops of small   Past Surgical History:    Procedure Laterality Date  . abdominal mesh inserted  june 2006, may 2008,dec 2010  . CHOLECYSTECTOMY  june 2007  . COLON SURGERY  sept 2007  . COLONOSCOPY    . COLONOSCOPY N/A 02/20/2014   Procedure: COLONOSCOPY;  Surgeon: Danie Binder, MD;  Location: [facility];  Service: Endoscopy;  Laterality: N/A;  . HERNIA REPAIR  05/2005 5/20108, 11/2009   with mesh  . KIDNEY CYST REMOVAL  jan 2005   renal cell carcinoma  . knee minescus repair Left feb 2007  . NEPHRECTOMY Left 12/2003     SOCIAL HISTORY:  Social History   Socioeconomic History  . Marital status: Married    Spouse name: Zigmund Daniel  . Number of children: 4  . Years of education: 23  . Highest education level: Not on file  Occupational History    Comment: retired  Scientific laboratory technician  . Financial resource strain: Not on file  . Food insecurity:    Worry: Not on file    Inability: Not on file  . Transportation needs:    Medical: Not on file    Non-medical: Not on file  Tobacco Use  . Smoking status: Former Smoker    Last attempt to quit: 12/06/1996    Years since quitting: 21.6  . Smokeless tobacco: Never Used  Substance and Sexual Activity  . Alcohol use:  Yes    Comment: a few shots a few times a month  . Drug use: No  . Sexual activity: Not on file  Lifestyle  . Physical activity:    Days per week: Not on file    Minutes per session: Not on file  . Stress: Not on file  Relationships  . Social connections:    Talks on phone: Not on file    Gets together: Not on file    Attends religious service: Not on file    Active member of club or organization: Not on file    Attends meetings of clubs or organizations: Not on file    Relationship status: Not on file  . Intimate partner violence:    Fear of current or ex partner: Not on file    Emotionally abused: Not on file    Physically abused: Not on file    Forced sexual activity: Not on file  Other Topics Concern  . 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   Lives at home with wife, daughter, family   Caffeine- sodas, 2 daily    FAMILY HISTORY:  Family History  Problem Relation Age of Onset  . Cancer Mother        lung, met to brain  . Diabetes Mellitus II Sister   . Colon cancer Neg Hx     CURRENT MEDICATIONS:  Outpatient Encounter Medications as of 08/02/2018  Medication Sig  . acetaminophen (TYLENOL) 325 MG tablet Take 650 mg by mouth every 6 (six) hours as needed.  Marland Kitchen albuterol (PROVENTIL) (2.5 MG/3ML) 0.083% nebulizer solution TAKE 3MLS(1 VIAL) VIA NEBULIZER EVERY 6 HOURS AS NEEDED FOR WHEEZING OR SHORTNESS OF BREATH  . allopurinol (ZYLOPRIM) 100 MG tablet TAKE 1 TABLET(100 MG) BY MOUTH AT BEDTIME  . amLODipine (NORVASC) 10 MG tablet TAKE 1 TABLET BY MOUTH DAILY  . ATROVENT HFA 17 MCG/ACT inhaler INHALE 2 PUFFS BY MOUTH INTO THE LUNGS EVERY 6 HOURS  . calcium carbonate (TUMS - DOSED IN MG ELEMENTAL CALCIUM) 500 MG chewable tablet Chew 2-3 tablets by mouth once as needed for indigestion or heartburn. Reported on 11/21/2015  . citalopram (CELEXA) 10 MG tablet TAKE 1 TABLET(10 MG) BY MOUTH DAILY  . clobetasol cream (TEMOVATE) 4.23 % Apply 1 application topically 2 (two) times daily.  . clopidogrel (PLAVIX) 75 MG tablet TAKE 1 TABLET(75 MG) BY MOUTH DAILY  . diphenhydrAMINE (BENADRYL) 25 MG tablet Take 25 mg by mouth every evening.   . folic acid (FOLVITE) 1 MG tablet Take 1 tablet (1 mg total) by mouth daily.  . Glucosamine-Chondroit-Vit C-Mn (GLUCOSAMINE 1500 COMPLEX) CAPS Take 1,200 capsules by mouth 2 (two) times daily.  Marland Kitchen HYDROcodone-acetaminophen (NORCO) 10-325 MG tablet Take 1-2 tablets by mouth every 8 (eight) hours as needed.  . levalbuterol (XOPENEX HFA) 45 MCG/ACT inhaler Inhale 1-2 puffs into the lungs every 4 (four) hours as needed for wheezing.  Marland Kitchen levothyroxine (SYNTHROID, LEVOTHROID) 50 MCG tablet TAKE 1 TABLET(50 MCG) BY MOUTH DAILY BEFORE BREAKFAST  . loratadine  (CLARITIN) 10 MG tablet Take 10 mg by mouth every morning.   . metoprolol tartrate (LOPRESSOR) 50 MG tablet TAKE 1 TABLET(50 MG) BY MOUTH TWICE DAILY  . mometasone (ASMANEX 60 METERED DOSES) 220 MCG/INH inhaler Inhale 2 puffs into the lungs 2 (two) times daily.  . Multiple Vitamin (MULTIVITAMIN WITH MINERALS) TABS tablet Take 1 tablet by mouth daily.  . Omega-3 Fatty  Acids (FISH OIL) 1000 MG CPDR Take by mouth 2 (two) times daily.  . ondansetron (ZOFRAN) 4 MG tablet Take 1 tablet (4 mg total) by mouth every 8 (eight) hours as needed for nausea or vomiting.  . predniSONE (DELTASONE) 20 MG tablet Take 3 tablets (60 mg total) by mouth daily with breakfast.  . ranitidine (ZANTAC) 75 MG tablet Take 75 mg by mouth 2 (two) times daily.  Marland Kitchen sulfamethoxazole-trimethoprim (BACTRIM DS,SEPTRA DS) 800-160 MG tablet Take 1 tablet by mouth 3 (three) times a week.  . vitamin B-12 (CYANOCOBALAMIN) 1000 MCG tablet Take 1,000 mcg by mouth every morning.   . vitamin C (ASCORBIC ACID) 500 MG tablet Take 500 mg by mouth 2 (two) times daily.  . vitamin E 400 UNIT capsule Take 400 Units by mouth every morning.    No facility-administered encounter medications on file as of 08/02/2018.     ALLERGIES:  Allergies  Allergen Reactions  . Contrast Media [Iodinated Diagnostic Agents]     Single functioning kidney  . Other Hives    Neoprene fabric Strawberry   . Ether Rash    Took a long time to wake up Doesn't wake up from it     PHYSICAL EXAM:  ECOG Performance status: 1  Vitals:   08/02/18 1125  BP: (!) 127/54  Pulse: 72  Resp: 20  Temp: 98.1 F (36.7 C)  SpO2: 97%   Filed Weights   08/02/18 1125  Weight: 246 lb (111.6 kg)    Physical Exam  Constitutional: He is oriented to person, place, and time. He appears well-developed and well-nourished.  Cardiovascular: Normal rate, regular rhythm and normal heart sounds.  Pulmonary/Chest: Effort normal and breath sounds normal.  Neurological: He is alert  and oriented to person, place, and time.  Skin: Skin is warm and dry.     LABORATORY DATA:  I have reviewed the labs as listed.  CBC    Component Value Date/Time   WBC 97.6 (HH) 08/02/2018 0957   RBC 3.79 (L) 08/02/2018 0957   RBC 3.79 (L) 08/02/2018 0957   HGB 12.0 (L) 08/02/2018 0957   HCT 39.4 08/02/2018 0957   PLT 134 (L) 08/02/2018 0957   MCV 104.0 (H) 08/02/2018 0957   MCH 31.7 08/02/2018 0957   MCHC 30.5 08/02/2018 0957   RDW 15.6 (H) 08/02/2018 0957   LYMPHSABS 95.6 (H) 08/02/2018 0957   MONOABS 0.0 (L) 08/02/2018 0957   EOSABS 0.0 08/02/2018 0957   BASOSABS 0.0 08/02/2018 0957   CMP Latest Ref Rng & Units 06/21/2018 06/13/2018 06/06/2018  Glucose 70 - 99 mg/dL 187(H) 188(H) 218(H)  BUN 8 - 23 mg/dL 30(H) 33(H) 34(H)  Creatinine 0.61 - 1.24 mg/dL 1.45(H) 1.36(H) 1.43(H)  Sodium 135 - 145 mmol/L 139 140 138  Potassium 3.5 - 5.1 mmol/L 4.6 4.4 6.1(H)  Chloride 98 - 111 mmol/L 106 106 106  CO2 22 - 32 mmol/L 26 28 24   Calcium 8.9 - 10.3 mg/dL 8.9 8.7(L) 9.1  Total Protein 6.5 - 8.1 g/dL 5.7(L) 5.3(L) 5.8(L)  Total Bilirubin 0.3 - 1.2 mg/dL 0.8 0.8 0.8  Alkaline Phos 38 - 126 U/L 33(L) 34(L) 34(L)  AST 15 - 41 U/L 14(L) 18 17  ALT 0 - 44 U/L 22 33 37         ASSESSMENT & PLAN:   CLL (chronic lymphocytic leukemia) ([facility]) 1.  Autoimmune hemolytic anemia:  -He developed autoimmune hemolytic anemia as a complication of CLL.  Direct Coombs test was positive for  IgG and Cd3.  We have started him on prednisone 1 mg/kg rounded of to 100 mg daily on 05/19/2018.  He is tolerating it very well. 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  His LDH has normalized.  We have cut back on his Procrit to 30,000 units on 06/07/2018.  We have also tapered his prednisone to 80 mg daily on 06/14/2018. - His prednisone dose was reduced to 60 mg on 06/21/2018.  He went on a trip to [place] for last 4 weeks.  He did not feel any severe  fatigue. - He complained of some shakiness in his hands.  I have reviewed his blood count today.  Hemoglobin is around 12.  He does not require any Procrit.  White count was 97.6.  Platelet count 134.  Reticulocyte count was 1.3%.  LDH was normal at 155. -I have recommended cutting back on the dose of prednisone to 40 mg daily.  He will continue folic acid daily. -I have also discussed the possibility of starting him on treatment for his CLL if any further worsening of his hemolytic anemia. - I will reevaluate him in 4 to 5 weeks.  2.  Hypogammaglobulinemia: Despite his IgG levels in the 400 range, he does not have any recurrent infections requiring antibiotics.  He was treated with IVIG in February 2017, which was discontinued secondary to high cost.  3.  Stage III CKD: He had a left nephrectomy for renal cell carcinoma in 2005.  He continues to be in remission.  His kidney function has been stable.  He is continuing to receive Procrit as needed for CKD and hemolysis.   4.  ID prophylaxis: -  He will continue Bactrim DS 1 tablet daily on Monday, Wednesday and Friday for PCP prophylaxis.       Orders placed this encounter:  Orders Placed This Encounter  Procedures  . CBC with Differential/Platelet  . Comprehensive metabolic panel  . Lactate dehydrogenase  . Reticulocytes      Derek Jack, MD [place] [phone]

## 2018-08-02 NOTE — Patient Instructions (Signed)
[facility] at [facility] Discharge Instructions  Follow up in 6 weeks with labs. Continue weekly labs.   Thank you for choosing [facility] at [facility] to provide your oncology and hematology care.  To afford each patient quality time with our provider, please arrive at least 15 minutes before your scheduled appointment time.   If you have a lab appointment with the [facility] please come in thru the  Main Entrance and check in at the main information desk  You need to re-schedule your appointment should you arrive 10 or more minutes late.  We strive to give you quality time with our providers, and arriving late affects you and other patients whose appointments are after yours.  Also, if you no show three or more times for appointments you may be dismissed from the clinic at the providers discretion.     Again, thank you for choosing [facility].  Our hope is that these requests will decrease the amount of time that you wait before being seen by our physicians.       _____________________________________________________________  Should you have questions after your visit to [facility], please contact our office at [phone] between the hours of 8:00 a.m. and 4:30 p.m.  Voicemails left after 4:00 p.m. will not be returned until the following business day.  For prescription refill requests, have your pharmacy contact our office and allow 72 hours.    Cancer Center Support Programs:   > Cancer Support Group  2nd Tuesday of the month 1pm-2pm, Journey Room

## 2018-08-02 NOTE — Progress Notes (Signed)
CRITICAL VALUE ALERT Critical value received:  WBC 97.6 Date of notification:  08/02/18 Time of notification: 0923 Critical value read back:  Yes.   Nurse who received alert:  C Page RN MD notified (1st page): Dr. Raliegh Ip notified. Nothing needs to be done currently for this.   HGB 12. No need for ESA today.

## 2018-08-07 ENCOUNTER — Other Ambulatory Visit: Payer: Self-pay | Admitting: Family Medicine

## 2018-08-09 ENCOUNTER — Ambulatory Visit (HOSPITAL_COMMUNITY): Payer: Medicare Other

## 2018-08-09 ENCOUNTER — Other Ambulatory Visit (HOSPITAL_COMMUNITY): Payer: Medicare Other

## 2018-08-15 ENCOUNTER — Other Ambulatory Visit (HOSPITAL_COMMUNITY): Payer: Self-pay | Admitting: Internal Medicine

## 2018-08-16 ENCOUNTER — Inpatient Hospital Stay (HOSPITAL_COMMUNITY): Payer: Medicare Other

## 2018-08-16 ENCOUNTER — Inpatient Hospital Stay (HOSPITAL_COMMUNITY): Payer: Medicare Other | Attending: Hematology

## 2018-08-16 DIAGNOSIS — C9110 Chronic lymphocytic leukemia of B-cell type not having achieved remission: Secondary | ICD-10-CM | POA: Diagnosis not present

## 2018-08-16 LAB — CBC WITH DIFFERENTIAL/PLATELET
BASOS ABS: 0 10*3/uL (ref 0.0–0.1)
Basophils Relative: 0 %
EOS ABS: 0.8 10*3/uL — AB (ref 0.0–0.7)
Eosinophils Relative: 1 %
HCT: 39.7 % (ref 39.0–52.0)
Hemoglobin: 12 g/dL — ABNORMAL LOW (ref 13.0–17.0)
LYMPHS PCT: 90 %
Lymphs Abs: 74.7 10*3/uL — ABNORMAL HIGH (ref 0.7–4.0)
MCH: 30.9 pg (ref 26.0–34.0)
MCHC: 30.2 g/dL (ref 30.0–36.0)
MCV: 102.3 fL — ABNORMAL HIGH (ref 78.0–100.0)
MONOS PCT: 1 %
Monocytes Absolute: 0.8 10*3/uL (ref 0.1–1.0)
NEUTROS ABS: 5 10*3/uL (ref 1.7–7.7)
NEUTROS PCT: 6 %
Other: 2 %
PLATELETS: 135 10*3/uL — AB (ref 150–400)
RBC: 3.88 MIL/uL — AB (ref 4.22–5.81)
RDW: 15.1 % (ref 11.5–15.5)
WBC: 83 10*3/uL — AB (ref 4.0–10.5)

## 2018-08-16 NOTE — Progress Notes (Signed)
CRITICAL VALUE ALERT Critical value received:  WBC-83 Date of notification:  08/16/18 Time of notification: 7125 Critical value read back:  Yes.   Nurse who received alert:  M.Lamiah Marmol, LPN MD notified (1st page):  V.Higgs, MD

## 2018-08-16 NOTE — Progress Notes (Signed)
Pt here today for Procrit injection. Per protocol pt does not need Procrit today. Pt aware that WBCs are down to 83 and that Hgb is 12.0. Pt will return in 1 week for follow up labs.

## 2018-08-17 LAB — PATHOLOGIST SMEAR REVIEW

## 2018-08-18 ENCOUNTER — Other Ambulatory Visit: Payer: Self-pay | Admitting: Family Medicine

## 2018-08-18 ENCOUNTER — Encounter: Payer: Self-pay | Admitting: Family Medicine

## 2018-08-18 MED ORDER — HYDROCODONE-ACETAMINOPHEN 10-325 MG PO TABS: 1.0000 | ORAL_TABLET | Freq: Three times a day (TID) | ORAL | 0 refills | Status: DC | PRN

## 2018-08-18 MED ORDER — HEPARIN SOD (PORK) LOCK FLUSH 100 UNIT/ML IV SOLN
INTRAVENOUS | Status: AC
  Filled 2018-08-18: qty 35

## 2018-08-18 NOTE — Telephone Encounter (Signed)
Ok to refill??  Last office visit 04/07/2018.  Last refill 07/03/2018.

## 2018-08-23 ENCOUNTER — Inpatient Hospital Stay (HOSPITAL_COMMUNITY): Payer: Medicare Other

## 2018-08-23 DIAGNOSIS — C9110 Chronic lymphocytic leukemia of B-cell type not having achieved remission: Secondary | ICD-10-CM

## 2018-08-23 DIAGNOSIS — N183 Chronic kidney disease, stage 3 unspecified: Secondary | ICD-10-CM

## 2018-08-23 LAB — CBC WITH DIFFERENTIAL/PLATELET
BLASTS: 0 %
Band Neutrophils: 0 %
Basophils Absolute: 0 10*3/uL (ref 0.0–0.1)
Basophils Relative: 0 %
EOS ABS: 0 10*3/uL (ref 0.0–0.7)
Eosinophils Relative: 0 %
HEMATOCRIT: 38.5 % — AB (ref 39.0–52.0)
HEMOGLOBIN: 11.7 g/dL — AB (ref 13.0–17.0)
Lymphocytes Relative: 96 %
Lymphs Abs: 68.3 10*3/uL — ABNORMAL HIGH (ref 0.7–4.0)
MCH: 30.9 pg (ref 26.0–34.0)
MCHC: 30.4 g/dL (ref 30.0–36.0)
MCV: 101.6 fL — AB (ref 78.0–100.0)
MONOS PCT: 0 %
Metamyelocytes Relative: 0 %
Monocytes Absolute: 0 10*3/uL — ABNORMAL LOW (ref 0.1–1.0)
Myelocytes: 0 %
NEUTROS ABS: 2.8 10*3/uL (ref 1.7–7.7)
NEUTROS PCT: 4 %
NRBC: 0 /100{WBCs}
Other: 0 %
PROMYELOCYTES RELATIVE: 0 %
Platelets: 127 10*3/uL — ABNORMAL LOW (ref 150–400)
RBC: 3.79 MIL/uL — AB (ref 4.22–5.81)
RDW: 15 % (ref 11.5–15.5)
WBC: 71.1 10*3/uL — AB (ref 4.0–10.5)

## 2018-08-23 MED ORDER — SULFAMETHOXAZOLE-TRIMETHOPRIM 800-160 MG PO TABS: 1.0000 | ORAL_TABLET | ORAL | 1 refills | Status: DC

## 2018-08-23 NOTE — Progress Notes (Signed)
Patient does not need ESA today. HGB 11.7. Copy of labs and appt schedule given to patient. Refill of Bactrim called in to [facility].

## 2018-08-23 NOTE — Progress Notes (Signed)
CRITICAL VALUE STICKER  CRITICAL VALUE:WBC 71.1  RECEIVER (on-site recipient of call):C. Travaughn Vue RN  [facility] NOTIFIED: 08-23-18, 1125  MESSENGER (representative from lab):  MD NOTIFIED: Dr. Walden Field  TIME OF NOTIFICATION:1130  RESPONSE: none at this time

## 2018-08-26 ENCOUNTER — Other Ambulatory Visit: Payer: Self-pay | Admitting: Family Medicine

## 2018-08-28 MED ORDER — ALBUTEROL SULFATE (2.5 MG/3ML) 0.083% IN NEBU: 2.5000 mg | INHALATION_SOLUTION | RESPIRATORY_TRACT | 0 refills | Status: AC | PRN

## 2018-08-30 ENCOUNTER — Inpatient Hospital Stay (HOSPITAL_COMMUNITY): Payer: Medicare Other

## 2018-08-30 DIAGNOSIS — N183 Chronic kidney disease, stage 3 unspecified: Secondary | ICD-10-CM

## 2018-08-30 DIAGNOSIS — C9110 Chronic lymphocytic leukemia of B-cell type not having achieved remission: Secondary | ICD-10-CM

## 2018-08-30 LAB — CBC WITH DIFFERENTIAL/PLATELET
Band Neutrophils: 0 %
Basophils Absolute: 0 10*3/uL (ref 0.0–0.1)
Basophils Relative: 0 %
Blasts: 0 %
EOS PCT: 0 %
Eosinophils Absolute: 0 10*3/uL (ref 0.0–0.7)
HCT: 39.3 % (ref 39.0–52.0)
HEMOGLOBIN: 12 g/dL — AB (ref 13.0–17.0)
LYMPHS ABS: 73.9 10*3/uL — AB (ref 0.7–4.0)
LYMPHS PCT: 92 %
MCH: 30.8 pg (ref 26.0–34.0)
MCHC: 30.5 g/dL (ref 30.0–36.0)
MCV: 100.8 fL — AB (ref 78.0–100.0)
MYELOCYTES: 0 %
Metamyelocytes Relative: 0 %
Monocytes Absolute: 0.8 10*3/uL (ref 0.1–1.0)
Monocytes Relative: 1 %
NEUTROS PCT: 7 %
NRBC: 0 /100{WBCs}
Neutro Abs: 5.6 10*3/uL (ref 1.7–7.7)
Other: 0 %
PROMYELOCYTES RELATIVE: 0 %
Platelets: 152 10*3/uL (ref 150–400)
RBC: 3.9 MIL/uL — AB (ref 4.22–5.81)
RDW: 15.3 % (ref 11.5–15.5)
WBC: 80.3 10*3/uL — AB (ref 4.0–10.5)

## 2018-08-30 NOTE — Progress Notes (Signed)
Hemoglobin 12.0 today. Hold procrit per protocol.  discharged home from clinic ambulatory. Follow up as scheduled.

## 2018-09-01 ENCOUNTER — Other Ambulatory Visit: Payer: Self-pay | Admitting: Family Medicine

## 2018-09-06 ENCOUNTER — Inpatient Hospital Stay (HOSPITAL_COMMUNITY): Payer: Medicare Other | Attending: Hematology

## 2018-09-06 ENCOUNTER — Inpatient Hospital Stay (HOSPITAL_COMMUNITY): Payer: Medicare Other

## 2018-09-06 DIAGNOSIS — C9110 Chronic lymphocytic leukemia of B-cell type not having achieved remission: Secondary | ICD-10-CM | POA: Insufficient documentation

## 2018-09-06 DIAGNOSIS — D801 Nonfamilial hypogammaglobulinemia: Secondary | ICD-10-CM | POA: Insufficient documentation

## 2018-09-06 DIAGNOSIS — N183 Chronic kidney disease, stage 3 unspecified: Secondary | ICD-10-CM

## 2018-09-06 DIAGNOSIS — I129 Hypertensive chronic kidney disease with stage 1 through stage 4 chronic kidney disease, or unspecified chronic kidney disease: Secondary | ICD-10-CM | POA: Diagnosis not present

## 2018-09-06 DIAGNOSIS — D591 Other autoimmune hemolytic anemias: Secondary | ICD-10-CM | POA: Diagnosis not present

## 2018-09-06 DIAGNOSIS — Z85528 Personal history of other malignant neoplasm of kidney: Secondary | ICD-10-CM | POA: Diagnosis not present

## 2018-09-06 LAB — CBC WITH DIFFERENTIAL/PLATELET
BASOS PCT: 0 %
Basophils Absolute: 0.1 10*3/uL (ref 0.0–0.1)
EOS ABS: 0.1 10*3/uL (ref 0.0–0.7)
EOS PCT: 0 %
HCT: 37.8 % — ABNORMAL LOW (ref 39.0–52.0)
Hemoglobin: 11.5 g/dL — ABNORMAL LOW (ref 13.0–17.0)
Lymphocytes Relative: 96 %
MCH: 30.7 pg (ref 26.0–34.0)
MCHC: 30.4 g/dL (ref 30.0–36.0)
MCV: 101.1 fL — AB (ref 78.0–100.0)
Monocytes Relative: 1 %
NEUTROS PCT: 3 %
PLATELETS: 160 10*3/uL (ref 150–400)
RBC: 3.74 MIL/uL — ABNORMAL LOW (ref 4.22–5.81)
RDW: 15.5 % (ref 11.5–15.5)
WBC: 76 10*3/uL (ref 4.0–10.5)

## 2018-09-06 NOTE — Progress Notes (Signed)
Hemoglobin 11.5 today. Will hold procrit per protocol.   Follow up as scheduled.

## 2018-09-13 ENCOUNTER — Inpatient Hospital Stay (HOSPITAL_COMMUNITY): Payer: Medicare Other

## 2018-09-13 ENCOUNTER — Inpatient Hospital Stay (HOSPITAL_BASED_OUTPATIENT_CLINIC_OR_DEPARTMENT_OTHER): Payer: Medicare Other | Admitting: Hematology

## 2018-09-13 ENCOUNTER — Encounter (HOSPITAL_COMMUNITY): Payer: Self-pay | Admitting: Hematology

## 2018-09-13 DIAGNOSIS — D801 Nonfamilial hypogammaglobulinemia: Secondary | ICD-10-CM

## 2018-09-13 DIAGNOSIS — C9110 Chronic lymphocytic leukemia of B-cell type not having achieved remission: Secondary | ICD-10-CM | POA: Diagnosis not present

## 2018-09-13 DIAGNOSIS — D591 Other autoimmune hemolytic anemias: Secondary | ICD-10-CM | POA: Diagnosis not present

## 2018-09-13 DIAGNOSIS — I129 Hypertensive chronic kidney disease with stage 1 through stage 4 chronic kidney disease, or unspecified chronic kidney disease: Secondary | ICD-10-CM

## 2018-09-13 DIAGNOSIS — N183 Chronic kidney disease, stage 3 (moderate): Secondary | ICD-10-CM

## 2018-09-13 DIAGNOSIS — Z85528 Personal history of other malignant neoplasm of kidney: Secondary | ICD-10-CM | POA: Diagnosis not present

## 2018-09-13 LAB — CBC WITH DIFFERENTIAL/PLATELET
ABS IMMATURE GRANULOCYTES: 0.23 10*3/uL — AB (ref 0.00–0.07)
Basophils Absolute: 0.1 10*3/uL (ref 0.0–0.1)
Basophils Relative: 0 %
Eosinophils Absolute: 0.1 10*3/uL (ref 0.0–0.5)
Eosinophils Relative: 0 %
HCT: 39.5 % (ref 39.0–52.0)
HEMOGLOBIN: 11.6 g/dL — AB (ref 13.0–17.0)
IMMATURE GRANULOCYTES: 0 %
LYMPHS ABS: 70.9 10*3/uL — AB (ref 0.7–4.0)
LYMPHS PCT: 84 %
MCH: 30.2 pg (ref 26.0–34.0)
MCHC: 29.4 g/dL — AB (ref 30.0–36.0)
MCV: 102.9 fL — ABNORMAL HIGH (ref 80.0–100.0)
Monocytes Absolute: 6.7 10*3/uL — ABNORMAL HIGH (ref 0.1–1.0)
Monocytes Relative: 8 %
NEUTROS ABS: 6.5 10*3/uL (ref 1.7–7.7)
NEUTROS PCT: 8 %
NRBC: 0 % (ref 0.0–0.2)
Platelets: 160 10*3/uL (ref 150–400)
RBC: 3.84 MIL/uL — ABNORMAL LOW (ref 4.22–5.81)
RDW: 15.5 % (ref 11.5–15.5)
WBC: 84.6 10*3/uL (ref 4.0–10.5)

## 2018-09-13 LAB — COMPREHENSIVE METABOLIC PANEL
ALBUMIN: 3.8 g/dL (ref 3.5–5.0)
ALK PHOS: 29 U/L — AB (ref 38–126)
ALT: 21 U/L (ref 0–44)
ANION GAP: 9 (ref 5–15)
AST: 14 U/L — AB (ref 15–41)
BILIRUBIN TOTAL: 0.7 mg/dL (ref 0.3–1.2)
BUN: 29 mg/dL — AB (ref 8–23)
CALCIUM: 9 mg/dL (ref 8.9–10.3)
CO2: 26 mmol/L (ref 22–32)
Chloride: 102 mmol/L (ref 98–111)
Creatinine, Ser: 1.72 mg/dL — ABNORMAL HIGH (ref 0.61–1.24)
GFR calc Af Amer: 45 mL/min — ABNORMAL LOW (ref >60)
GFR calc non Af Amer: 39 mL/min — ABNORMAL LOW (ref >60)
GLUCOSE: 207 mg/dL — AB (ref 70–99)
Potassium: 4.5 mmol/L (ref 3.5–5.1)
SODIUM: 137 mmol/L (ref 135–145)
Total Protein: 5.8 g/dL — ABNORMAL LOW (ref 6.5–8.1)

## 2018-09-13 LAB — RETICULOCYTES
Immature Retic Fract: 27 % — ABNORMAL HIGH (ref 2.3–15.9)
RBC.: 3.84 MIL/uL — ABNORMAL LOW (ref 4.22–5.81)
RETIC CT PCT: 2 % (ref 0.4–3.1)
Retic Count, Absolute: 74.9 10*3/uL (ref 19.0–186.0)

## 2018-09-13 LAB — LACTATE DEHYDROGENASE: LDH: 147 U/L (ref 98–192)

## 2018-09-13 NOTE — Assessment & Plan Note (Signed)
1.  Autoimmune hemolytic anemia:  -He developed autoimmune hemolytic anemia as a complication of CLL.  Direct Coombs test was positive for IgG and Cd3.  We have started him on prednisone 1 mg/kg rounded of to 100 mg daily on 05/19/2018.  He is tolerating it very well. 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  His LDH has normalized.  We have cut back on his Procrit to 30,000 units on 06/07/2018.  We have also tapered his prednisone to 80 mg daily on 06/14/2018. - His prednisone dose was reduced to 60 mg on 06/21/2018.  He went on a trip to [place] for last 4 weeks.  He did not feel any severe fatigue. -Prednisone was dose reduced to 40 mg daily on 08/02/2018. -We discussed the blood work from today.  Hemoglobin is around 11.5.  I have recommended dose reduction of prednisone to 30 mg daily starting today.  He will continue folic acid daily. - If there is any drop in his hemoglobin, I will consider treatment for his CLL with newer agents like ibrutinib or venetoclax and obinutuzumab combination.  2.  Hypogammaglobulinemia: Despite his IgG levels in the 400 range, he does not have any recurrent infections requiring antibiotics.  He was treated with IVIG in February 2017, which was discontinued secondary to high cost.  3.  Stage III CKD: He had a left nephrectomy for renal cell carcinoma in 2005.  He continues to be in remission.  His kidney function has been stable.  He will continue Procrit as needed for CKD.  4.  ID prophylaxis: - He will continue Bactrim DS 1 tablet 3 times a week on Monday Wednesday and Friday.

## 2018-09-13 NOTE — Progress Notes (Signed)
[name] Ball, [name] Ball [id number]   CLINIC:  Medical Oncology/Hematology  PCP:  [name] Rossetti, MD [address] [place] [address] [phone]   REASON FOR VISIT: Follow-up for CLL AND autoimmune hemolytic anemia  CURRENT THERAPY: Prednisone at 40 mg daily and procrit  INTERVAL HISTORY:  [name] Ball 67 y.o. male returns for routine follow-up for CLL and autoimmune hemolytic anemia. He is here today with his wife. He is feeling anxious and jittery inside. He feels it is from his steroids. He has been more fatigued than normal and sleeping most of the day and all night. We will go down on the prednisone to 30 mg daily and change his weekly blood draws to every 2 weeks. He denies any nausea, vomiting, and diarrhea. Denies any fevers or recent infections.    REVIEW OF SYSTEMS:  Review of Systems  Constitutional: Positive for fatigue.  All other systems reviewed and are negative.    PAST MEDICAL/SURGICAL HISTORY:  Past Medical History:  Diagnosis Date  . Allergy   . Arthritis   . Asthma   . Cancer ([place])    kidney  . CLL (chronic lymphocytic leukemia) ([facility]) 2016  . COPD (chronic obstructive pulmonary disease) ([facility])   . Diverticulitis   . Diverticulosis    colonoscopy 3/15  . GERD (gastroesophageal reflux disease)   . Hypertension   . Low testosterone   . Migraine   . Shingles 03/31/2016  . Solar purpura ([place])   . Thyroid disease    hypothryoid  . Ulcer   . Ventral hernia FEB 2015 CT    Large infra umbilical ventral hernia containing loops of small   Past Surgical History:  Procedure Laterality Date  . abdominal mesh inserted  june 2006, may 2008,dec 2010  . CHOLECYSTECTOMY  june 2007  . COLON SURGERY  sept 2007  . COLONOSCOPY    . COLONOSCOPY N/A 02/20/2014   Procedure: COLONOSCOPY;  Surgeon: Danie Binder, MD;  Location: [facility];  Service: Endoscopy;  Laterality: N/A;  . HERNIA REPAIR  05/2005 5/20108, 11/2009    with mesh  . KIDNEY CYST REMOVAL  jan 2005   renal cell carcinoma  . knee minescus repair Left feb 2007  . NEPHRECTOMY Left 12/2003     SOCIAL HISTORY:  Social History   Socioeconomic History  . Marital status: Married    Spouse name: [name] Ball  . Number of children: 4  . Years of education: 10  . Highest education level: Not on file  Occupational History    Comment: retired  Scientific laboratory technician  . Financial resource strain: Not on file  . Food insecurity:    Worry: Not on file    Inability: Not on file  . Transportation needs:    Medical: Not on file    Non-medical: Not on file  Tobacco Use  . Smoking status: Former Smoker    Last attempt to quit: 12/06/1996    Years since quitting: 21.7  . Smokeless tobacco: Never Used  Substance and Sexual Activity  . Alcohol use: Yes    Comment: a few shots a few times a month  . Drug use: No  . Sexual activity: Not on file  Lifestyle  . Physical activity:    Days per week: Not on file    Minutes per session: Not on file  . Stress: Not on file  Relationships  . Social connections:    Talks on  phone: Not on file    Gets together: Not on file    Attends religious service: Not on file    Active member of club or organization: Not on file    Attends meetings of clubs or organizations: Not on file    Relationship status: Not on file  . Intimate partner violence:    Fear of current or ex partner: Not on file    Emotionally abused: Not on file    Physically abused: Not on file    Forced sexual activity: Not on file  Other Topics Concern  . 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   Lives at home with wife, daughter, family   Caffeine- sodas, 2 daily    FAMILY HISTORY:  Family History  Problem Relation Age of Onset  . Cancer Mother        lung, met to brain  . Diabetes Mellitus II Sister   . Colon cancer Neg Hx     CURRENT MEDICATIONS:  Outpatient Encounter Medications  as of 09/13/2018  Medication Sig  . acetaminophen (TYLENOL) 325 MG tablet Take 650 mg by mouth every 6 (six) hours as needed.  Marland Kitchen albuterol (PROVENTIL) (2.5 MG/3ML) 0.083% nebulizer solution Take 3 mLs (2.5 mg total) by nebulization every 2 (two) hours as needed for wheezing or shortness of breath.  . allopurinol (ZYLOPRIM) 100 MG tablet TAKE 1 TABLET(100 MG) BY MOUTH AT BEDTIME  . amLODipine (NORVASC) 10 MG tablet TAKE 1 TABLET BY MOUTH DAILY  . ATROVENT HFA 17 MCG/ACT inhaler INHALE 2 PUFFS BY MOUTH INTO THE LUNGS EVERY 6 HOURS  . calcium carbonate (TUMS - DOSED IN MG ELEMENTAL CALCIUM) 500 MG chewable tablet Chew 2-3 tablets by mouth once as needed for indigestion or heartburn. Reported on 11/21/2015  . citalopram (CELEXA) 10 MG tablet TAKE 1 TABLET(10 MG) BY MOUTH DAILY  . clobetasol cream (TEMOVATE) 4.78 % Apply 1 application topically 2 (two) times daily.  . clopidogrel (PLAVIX) 75 MG tablet TAKE 1 TABLET(75 MG) BY MOUTH DAILY  . diphenhydrAMINE (BENADRYL) 25 MG tablet Take 25 mg by mouth every evening.   . folic acid (FOLVITE) 1 MG tablet Take 1 tablet (1 mg total) by mouth daily.  . Glucosamine-Chondroit-Vit C-Mn (GLUCOSAMINE 1500 COMPLEX) CAPS Take 1,200 capsules by mouth 2 (two) times daily.  Marland Kitchen HYDROcodone-acetaminophen (NORCO) 10-325 MG tablet Take 1-2 tablets by mouth every 8 (eight) hours as needed.  . levalbuterol (XOPENEX HFA) 45 MCG/ACT inhaler Inhale 1-2 puffs into the lungs every 4 (four) hours as needed for wheezing.  Marland Kitchen levothyroxine (SYNTHROID, LEVOTHROID) 50 MCG tablet TAKE 1 TABLET(50 MCG) BY MOUTH DAILY BEFORE BREAKFAST  . loratadine (CLARITIN) 10 MG tablet Take 10 mg by mouth every morning.   . metoprolol tartrate (LOPRESSOR) 50 MG tablet TAKE 1 TABLET(50 MG) BY MOUTH TWICE DAILY  . mometasone (ASMANEX 60 METERED DOSES) 220 MCG/INH inhaler Inhale 2 puffs into the lungs 2 (two) times daily.  . Multiple Vitamin (MULTIVITAMIN WITH MINERALS) TABS tablet Take 1 tablet by mouth  daily.  . Omega-3 Fatty Acids (FISH OIL) 1000 MG CPDR Take by mouth 2 (two) times daily.  . ondansetron (ZOFRAN) 4 MG tablet Take 1 tablet (4 mg total) by mouth every 8 (eight) hours as needed for nausea or vomiting.  . predniSONE (DELTASONE) 20 MG tablet Take 3 tablets (60 mg total) by mouth daily with breakfast.  . ranitidine (ZANTAC) 75 MG  tablet Take 75 mg by mouth 2 (two) times daily.  Marland Kitchen sulfamethoxazole-trimethoprim (BACTRIM DS,SEPTRA DS) 800-160 MG tablet Take 1 tablet by mouth 3 (three) times a week.  . vitamin B-12 (CYANOCOBALAMIN) 1000 MCG tablet Take 1,000 mcg by mouth every morning.   . vitamin C (ASCORBIC ACID) 500 MG tablet Take 500 mg by mouth 2 (two) times daily.  . vitamin E 400 UNIT capsule Take 400 Units by mouth every morning.    No facility-administered encounter medications on file as of 09/13/2018.     ALLERGIES:  Allergies  Allergen Reactions  . Contrast Media [Iodinated Diagnostic Agents]     Single functioning kidney  . Other Hives    Neoprene fabric Strawberry   . Ether Rash    Took a long time to wake up Doesn't wake up from it     PHYSICAL EXAM:  ECOG Performance status: 1  Vitals:   09/13/18 1052  BP: 132/60  Pulse: 69  Resp: 20  Temp: 97.8 F (36.6 C)  SpO2: 98%   Filed Weights   09/13/18 1052  Weight: 258 lb (117 kg)    Physical Exam  Constitutional: He is oriented to person, place, and time. He appears well-developed and well-nourished.  Musculoskeletal: Normal range of motion.  Neurological: He is alert and oriented to person, place, and time.  Skin: Skin is warm and dry.  Psychiatric: He has a normal mood and affect. His behavior is normal. Judgment and thought content normal.     LABORATORY DATA:  I have reviewed the labs as listed.  CBC    Component Value Date/Time   WBC 84.6 (HH) 09/13/2018 0959   RBC 3.84 (L) 09/13/2018 0959   RBC 3.84 (L) 09/13/2018 0959   HGB 11.6 (L) 09/13/2018 0959   HCT 39.5 09/13/2018 0959    PLT 160 09/13/2018 0959   MCV 102.9 (H) 09/13/2018 0959   MCH 30.2 09/13/2018 0959   MCHC 29.4 (L) 09/13/2018 0959   RDW 15.5 09/13/2018 0959   LYMPHSABS 70.9 (H) 09/13/2018 0959   MONOABS 6.7 (H) 09/13/2018 0959   EOSABS 0.1 09/13/2018 0959   BASOSABS 0.1 09/13/2018 0959   CMP Latest Ref Rng & Units 09/13/2018 06/21/2018 06/13/2018  Glucose 70 - 99 mg/dL 207(H) 187(H) 188(H)  BUN 8 - 23 mg/dL 29(H) 30(H) 33(H)  Creatinine 0.61 - 1.24 mg/dL 1.72(H) 1.45(H) 1.36(H)  Sodium 135 - 145 mmol/L 137 139 140  Potassium 3.5 - 5.1 mmol/L 4.5 4.6 4.4  Chloride 98 - 111 mmol/L 102 106 106  CO2 22 - 32 mmol/L 26 26 28   Calcium 8.9 - 10.3 mg/dL 9.0 8.9 8.7(L)  Total Protein 6.5 - 8.1 g/dL 5.8(L) 5.7(L) 5.3(L)  Total Bilirubin 0.3 - 1.2 mg/dL 0.7 0.8 0.8  Alkaline Phos 38 - 126 U/L 29(L) 33(L) 34(L)  AST 15 - 41 U/L 14(L) 14(L) 18  ALT 0 - 44 U/L 21 22 33           ASSESSMENT & PLAN:   CLL (chronic lymphocytic leukemia) ([facility]) 1.  Autoimmune hemolytic anemia:  -He developed autoimmune hemolytic anemia as a complication of CLL.  Direct [place] test was positive for IgG and Cd3.  We have started him on prednisone 1 mg/kg rounded of to 100 mg daily on 05/19/2018.  He is tolerating it very well. 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  His LDH has normalized.  We have cut back on  his Procrit to 30,000 units on 06/07/2018.  We have also tapered his prednisone to 80 mg daily on 06/14/2018. - His prednisone dose was reduced to 60 mg on 06/21/2018.  He went on a trip to [place] for last 4 weeks.  He did not feel any severe fatigue. -Prednisone was dose reduced to 40 mg daily on 08/02/2018. -We discussed the blood work from today.  Hemoglobin is around 11.5.  I have recommended dose reduction of prednisone to 30 mg daily starting today.  He will continue folic acid daily. - If there is any drop in his hemoglobin, I will consider treatment for his  CLL with newer agents like ibrutinib or venetoclax and obinutuzumab combination.  2.  Hypogammaglobulinemia: Despite his IgG levels in the 400 range, he does not have any recurrent infections requiring antibiotics.  He was treated with IVIG in February 2017, which was discontinued secondary to high cost.  3.  Stage III CKD: He had a left nephrectomy for renal cell carcinoma in 2005.  He continues to be in remission.  His kidney function has been stable.  He will continue Procrit as needed for CKD.  4.  ID prophylaxis: - He will continue Bactrim DS 1 tablet 3 times a week on Monday Wednesday and Friday.   He was told to have a flu vaccine.    Orders placed this encounter:  Orders Placed This Encounter  Procedures  . Lactate dehydrogenase  . CBC with Differential/Platelet  . Comprehensive metabolic panel  . Reticulocytes  . CBC with Differential/Platelet  . Comprehensive metabolic panel  . Lactate dehydrogenase  . Reticulocytes      Derek Jack, MD [place] [phone]

## 2018-09-13 NOTE — Progress Notes (Signed)
No shot needed today. Dr. Raliegh Ip to change patient to every 2 week labs and injection today.

## 2018-09-13 NOTE — Patient Instructions (Signed)
[place] at [facility] Discharge Instructions  Change blood draws to every other week. We will follow up with you in a month.   Thank you for choosing [place] at [facility] to provide your oncology and hematology care.  To afford each patient quality time with our provider, please arrive at least 15 minutes before your scheduled appointment time.   If you have a lab appointment with the [facility] please come in thru the  Main Entrance and check in at the main information desk  You need to re-schedule your appointment should you arrive 10 or more minutes late.  We strive to give you quality time with our providers, and arriving late affects you and other patients whose appointments are after yours.  Also, if you no show three or more times for appointments you may be dismissed from the clinic at the providers discretion.     Again, thank you for choosing [facility].  Our hope is that these requests will decrease the amount of time that you wait before being seen by our physicians.       _____________________________________________________________  Should you have questions after your visit to [facility], please contact our office at [phone] between the hours of 8:00 a.m. and 4:30 p.m.  Voicemails left after 4:00 p.m. will not be returned until the following business day.  For prescription refill requests, have your pharmacy contact our office and allow 72 hours.    Cancer Center Support Programs:   > Cancer Support Group  2nd Tuesday of the month 1pm-2pm, Journey Room

## 2018-09-14 ENCOUNTER — Other Ambulatory Visit: Payer: Self-pay | Admitting: Physician Assistant

## 2018-09-14 MED ORDER — HEPARIN SOD (PORK) LOCK FLUSH 100 UNIT/ML IV SOLN
INTRAVENOUS | Status: AC
  Filled 2018-09-14: qty 5

## 2018-09-14 MED ORDER — HYDROCODONE-ACETAMINOPHEN 10-325 MG PO TABS: 1.0000 | ORAL_TABLET | Freq: Three times a day (TID) | ORAL | 0 refills | Status: DC | PRN

## 2018-09-14 NOTE — Telephone Encounter (Signed)
Ok to refill??  Last office visit 04/07/2018.  Last refill 08/18/2018.

## 2018-09-18 ENCOUNTER — Ambulatory Visit (INDEPENDENT_AMBULATORY_CARE_PROVIDER_SITE_OTHER): Payer: Medicare Other | Admitting: *Deleted

## 2018-09-18 DIAGNOSIS — Z23 Encounter for immunization: Secondary | ICD-10-CM

## 2018-09-19 DIAGNOSIS — G4733 Obstructive sleep apnea (adult) (pediatric): Secondary | ICD-10-CM | POA: Diagnosis not present

## 2018-09-26 ENCOUNTER — Other Ambulatory Visit (HOSPITAL_COMMUNITY): Payer: Self-pay

## 2018-09-26 DIAGNOSIS — C9110 Chronic lymphocytic leukemia of B-cell type not having achieved remission: Secondary | ICD-10-CM

## 2018-09-26 DIAGNOSIS — N183 Chronic kidney disease, stage 3 unspecified: Secondary | ICD-10-CM

## 2018-09-27 ENCOUNTER — Inpatient Hospital Stay (HOSPITAL_COMMUNITY): Payer: Medicare Other

## 2018-09-27 DIAGNOSIS — C9110 Chronic lymphocytic leukemia of B-cell type not having achieved remission: Secondary | ICD-10-CM

## 2018-09-27 DIAGNOSIS — D801 Nonfamilial hypogammaglobulinemia: Secondary | ICD-10-CM | POA: Diagnosis not present

## 2018-09-27 DIAGNOSIS — N183 Chronic kidney disease, stage 3 unspecified: Secondary | ICD-10-CM

## 2018-09-27 DIAGNOSIS — I129 Hypertensive chronic kidney disease with stage 1 through stage 4 chronic kidney disease, or unspecified chronic kidney disease: Secondary | ICD-10-CM | POA: Diagnosis not present

## 2018-09-27 DIAGNOSIS — D591 Other autoimmune hemolytic anemias: Secondary | ICD-10-CM | POA: Diagnosis not present

## 2018-09-27 DIAGNOSIS — Z85528 Personal history of other malignant neoplasm of kidney: Secondary | ICD-10-CM | POA: Diagnosis not present

## 2018-09-27 LAB — RETICULOCYTES
Immature Retic Fract: 24.4 % — ABNORMAL HIGH (ref 2.3–15.9)
RBC.: 3.86 MIL/uL — ABNORMAL LOW (ref 4.22–5.81)
Retic Count, Absolute: 83.4 10*3/uL (ref 19.0–186.0)
Retic Ct Pct: 2.2 % (ref 0.4–3.1)

## 2018-09-27 LAB — CBC
HEMATOCRIT: 39.2 % (ref 39.0–52.0)
HEMOGLOBIN: 11.4 g/dL — AB (ref 13.0–17.0)
MCH: 29.5 pg (ref 26.0–34.0)
MCHC: 29.1 g/dL — ABNORMAL LOW (ref 30.0–36.0)
MCV: 101.6 fL — ABNORMAL HIGH (ref 80.0–100.0)
NRBC: 0 % (ref 0.0–0.2)
Platelets: 136 10*3/uL — ABNORMAL LOW (ref 150–400)
RBC: 3.86 MIL/uL — AB (ref 4.22–5.81)
RDW: 15.7 % — AB (ref 11.5–15.5)
WBC: 81.9 10*3/uL — AB (ref 4.0–10.5)

## 2018-09-27 NOTE — Progress Notes (Signed)
CRITICAL VALUE ALERT Critical value received:  WBC 11.4 Date of notification:  09-27-2018 Time of notification: 7824 Critical value read back:  Yes.   Nurse who received alert:  C. Page RN MD notified (1st page):  Dr. Walden Field

## 2018-09-27 NOTE — Progress Notes (Signed)
Procrit held for hgb 11.4.  Pt notified.  Will return in 2 weeks for labs/OV/possible procrit injection.

## 2018-10-02 ENCOUNTER — Encounter (INDEPENDENT_AMBULATORY_CARE_PROVIDER_SITE_OTHER): Payer: Self-pay | Admitting: Orthopaedic Surgery

## 2018-10-02 ENCOUNTER — Ambulatory Visit (INDEPENDENT_AMBULATORY_CARE_PROVIDER_SITE_OTHER): Payer: Medicare Other | Admitting: Orthopaedic Surgery

## 2018-10-02 ENCOUNTER — Ambulatory Visit (INDEPENDENT_AMBULATORY_CARE_PROVIDER_SITE_OTHER): Payer: Self-pay

## 2018-10-02 VITALS — BP 156/76 | HR 71 | Ht 66.0 in | Wt 254.0 lb

## 2018-10-02 DIAGNOSIS — G8929 Other chronic pain: Secondary | ICD-10-CM

## 2018-10-02 DIAGNOSIS — M25561 Pain in right knee: Secondary | ICD-10-CM | POA: Diagnosis not present

## 2018-10-02 MED ORDER — LIDOCAINE HCL 1 % IJ SOLN
2.0000 mL | INTRAMUSCULAR | Status: AC | PRN
  Administered 2018-10-02: 2 mL

## 2018-10-02 MED ORDER — BUPIVACAINE HCL 0.5 % IJ SOLN
2.0000 mL | INTRAMUSCULAR | Status: AC | PRN
  Administered 2018-10-02: 2 mL via INTRA_ARTICULAR

## 2018-10-02 MED ORDER — METHYLPREDNISOLONE ACETATE 40 MG/ML IJ SUSP
80.0000 mg | INTRAMUSCULAR | Status: AC | PRN
  Administered 2018-10-02: 80 mg

## 2018-10-02 NOTE — Progress Notes (Signed)
Office Visit Note   Patient: [name] Ball           Date of Birth: October 20, 1951           MRN: [id number] Visit Date: 10/02/2018              Requested by: [name] Rossetti, MD [address] [place], [place] [address] PCP: [name] Rossetti, MD   Assessment & Plan: Visit Diagnoses:  1. Chronic pain of right knee     Plan: End-stage osteoarthritis right knee.  Multiple medical comorbidities will inject lateral compartment with cortisone and monitor response.  Have discussed use of knee support to prevent "giving way"  Follow-Up Instructions: Return if symptoms worsen or fail to improve.   Orders:  Orders Placed This Encounter  Procedures  . Large Joint Inj: R knee  . XR KNEE 3 VIEW RIGHT   No orders of the defined types were placed in this encounter.     Procedures: Large Joint Inj: R knee on 10/02/2018 12:34 PM Indications: pain and diagnostic evaluation Details: 25 G 1.5 in needle, anterolateral approach  Arthrogram: No  Medications: 2 mL lidocaine 1 %; 2 mL bupivacaine 0.5 %; 80 mg methylPREDNISolone acetate 40 MG/ML Procedure, treatment alternatives, risks and benefits explained, specific risks discussed. Consent was given by the patient. Immediately prior to procedure a time out was called to verify the correct patient, procedure, equipment, support staff and site/side marked as required. Patient was prepped and draped in the usual sterile fashion.       Clinical Data: No additional findings.   Subjective: Chief Complaint  Patient presents with  . New Patient (Initial Visit)    RE CURRENT R KNEE PAIN SINCE 06/2018 NO NEW INJURY GETTING WORSE LAST 2 WEEKS BARELY CAN WALK  [name] Ball is accompanied by his wife and seen for evaluation of chronic right knee pain.  He has been having quite a bit of difficulty along the lateral aspect of his knee as well as performing deep knee bends and squats.  He has had recurrent effusions.  He is on Plavix and only has  "one kidney".  He is limited in his ability to take medicines.  He has Tylenol.  He also has a history of chronic lymphocytic leukemia with active treatment  HPI  Review of Systems  Constitutional: Positive for fatigue. Negative for fever.  HENT: Negative for ear pain.   Eyes: Negative for pain.  Respiratory: Positive for shortness of breath. Negative for cough.   Cardiovascular: Positive for leg swelling.  Gastrointestinal: Positive for diarrhea. Negative for constipation.  Genitourinary: Negative for difficulty urinating.  Musculoskeletal: Positive for back pain. Negative for neck pain.  Skin: Negative for rash.  Allergic/Immunologic: Positive for food allergies.  Neurological: Positive for weakness. Negative for numbness.  Hematological: Bruises/bleeds easily.  Psychiatric/Behavioral: Positive for sleep disturbance.     Objective: Vital Signs: BP (!) 156/76 (BP Location: Right Arm, Patient Position: Sitting, Cuff Size: Normal)   Pulse 71   Ht 5' 6"  (1.676 m)   Wt 254 lb (115.2 kg)   BMI 41.00 kg/m   Physical Exam  Constitutional: He is oriented to person, place, and time. He appears well-developed and well-nourished.  HENT:  Mouth/Throat: Oropharynx is clear and moist.  Eyes: Pupils are equal, round, and reactive to light. EOM are normal.  Pulmonary/Chest: Effort normal.  Neurological: He is alert and oriented to person, place, and time.  Skin: Skin is warm and  dry.  Psychiatric: He has a normal mood and affect. His behavior is normal.    Ortho Exam awake alert and oriented x3.  Comfortable sitting.  Hearing impaired.  His wife helped to sign.  Very minimal effusion right knee.  Full extension and flexion about 95 degrees.  Mild bilateral ankle swelling.  No calf pain.  Areas of ecchymosis in his calf but no pain.  Painless range of motion both hips.  Predominant pain right knee is in the lateral compartment but has considerable patellar crepitation and mild discomfort with  patellar compression.  Specialty Comments:  No specialty comments available.  Imaging: Xr Knee 3 View Right  Result Date: 10/02/2018 Films of the right knee repayment 3 projections standing.  There is near bone-on-bone in the lateral compartment with peripheral osteophytes and subchondral sclerosis.  Approximately 5 to 6 degrees of valgus.  No acute changes.  Also degenerative changes at the patellofemoral joint.  Films consistent with advanced end-stage osteoarthritis    PMFS History: Patient Active Problem List   Diagnosis Date Noted  . History of CVA (cerebrovascular accident) 03/18/2017  . Complicated migraine 81/85/9093  . Asthma 03/09/2017  . Hypogammaglobulinemia ([place]) 01/27/2016  . CLL (chronic lymphocytic leukemia) ([place]) 11/10/2015  . Prediabetes 04/08/2015  . Depression 12/10/2014  . Low testosterone 12/10/2014  . Essential hypertension, benign 08/09/2014  . Sleep apnea 08/09/2014  . Onychomycosis 08/09/2014  . Diverticulosis of colon with hemorrhage 02/20/2014  . GI bleed 02/18/2014  . Recurrent ventral incisional hernia 01/21/2014  . Abdominal wall hernia 11/19/2013  . Hand dermatitis 09/18/2013  . Hypothyroidism 07/17/2013  . CKD (chronic kidney disease), stage III ([place]) 07/17/2013  . Obesity 07/17/2013  . Chronic pain syndrome 07/17/2013  . COPD with asthma ([place]) 07/17/2013  . GERD (gastroesophageal reflux disease) 07/17/2013   Past Medical History:  Diagnosis Date  . Allergy   . Arthritis   . Asthma   . Cancer ([place])    kidney  . CLL (chronic lymphocytic leukemia) ([facility]) 2016  . COPD (chronic obstructive pulmonary disease) ([place])   . Diverticulitis   . Diverticulosis    colonoscopy 3/15  . GERD (gastroesophageal reflux disease)   . Hypertension   . Low testosterone   . Migraine   . Shingles 03/31/2016  . Solar purpura ([place])   . Thyroid disease    hypothryoid  . Ulcer   . Ventral hernia FEB 2015 CT    Large infra umbilical ventral hernia  containing loops of small    Family History  Problem Relation Age of Onset  . Cancer Mother        lung, met to brain  . Diabetes Mellitus II Sister   . Colon cancer Neg Hx     Past Surgical History:  Procedure Laterality Date  . abdominal mesh inserted  june 2006, may 2008,dec 2010  . CHOLECYSTECTOMY  june 2007  . COLON SURGERY  sept 2007  . COLONOSCOPY    . COLONOSCOPY N/A 02/20/2014   Procedure: COLONOSCOPY;  Surgeon: Danie Binder, MD;  Location[facility];  Service: Endoscopy;  Laterality: N/A;  . HERNIA REPAIR  05/2005 5/20108, 11/2009   with mesh  . KIDNEY CYST REMOVAL  jan 2005   renal cell carcinoma  . knee minescus repair Left feb 2007  . NEPHRECTOMY Left 12/2003   Social History   Occupational History    Comment: retired  Tobacco Use  . Smoking status: Former Smoker    Last attempt to quit:  12/06/1996    Years since quitting: 21.8  . Smokeless tobacco: Never Used  Substance and Sexual Activity  . Alcohol use: Yes    Comment: a few shots a few times a month  . Drug use: No  . Sexual activity: Not on file

## 2018-10-06 ENCOUNTER — Other Ambulatory Visit: Payer: Self-pay | Admitting: Physician Assistant

## 2018-10-06 MED ORDER — HYDROCODONE-ACETAMINOPHEN 10-325 MG PO TABS: 1.0000 | ORAL_TABLET | Freq: Three times a day (TID) | ORAL | 0 refills | Status: DC | PRN

## 2018-10-06 NOTE — Telephone Encounter (Signed)
Ok to refill??  Last office visit 04/07/2018.  Last refill 09/14/2018.

## 2018-10-07 ENCOUNTER — Other Ambulatory Visit: Payer: Self-pay | Admitting: Family Medicine

## 2018-10-07 ENCOUNTER — Other Ambulatory Visit (HOSPITAL_COMMUNITY): Payer: Self-pay | Admitting: Nurse Practitioner

## 2018-10-07 DIAGNOSIS — C9110 Chronic lymphocytic leukemia of B-cell type not having achieved remission: Secondary | ICD-10-CM

## 2018-10-09 ENCOUNTER — Inpatient Hospital Stay (HOSPITAL_COMMUNITY): Payer: Medicare Other | Attending: Hematology

## 2018-10-09 DIAGNOSIS — I10 Essential (primary) hypertension: Secondary | ICD-10-CM | POA: Diagnosis not present

## 2018-10-09 DIAGNOSIS — Z87891 Personal history of nicotine dependence: Secondary | ICD-10-CM | POA: Insufficient documentation

## 2018-10-09 DIAGNOSIS — D591 Other autoimmune hemolytic anemias: Secondary | ICD-10-CM | POA: Diagnosis not present

## 2018-10-09 DIAGNOSIS — Z79899 Other long term (current) drug therapy: Secondary | ICD-10-CM | POA: Diagnosis not present

## 2018-10-09 DIAGNOSIS — D801 Nonfamilial hypogammaglobulinemia: Secondary | ICD-10-CM | POA: Diagnosis not present

## 2018-10-09 DIAGNOSIS — Z85528 Personal history of other malignant neoplasm of kidney: Secondary | ICD-10-CM | POA: Insufficient documentation

## 2018-10-09 DIAGNOSIS — Z905 Acquired absence of kidney: Secondary | ICD-10-CM | POA: Diagnosis not present

## 2018-10-09 DIAGNOSIS — C9110 Chronic lymphocytic leukemia of B-cell type not having achieved remission: Secondary | ICD-10-CM | POA: Diagnosis not present

## 2018-10-09 LAB — CBC WITH DIFFERENTIAL/PLATELET
Abs Immature Granulocytes: 0.2 10*3/uL — ABNORMAL HIGH (ref 0.00–0.07)
BASOS PCT: 0 %
Basophils Absolute: 0.2 10*3/uL — ABNORMAL HIGH (ref 0.0–0.1)
EOS ABS: 0.1 10*3/uL (ref 0.0–0.5)
EOS PCT: 0 %
HCT: 39.4 % (ref 39.0–52.0)
Hemoglobin: 11.9 g/dL — ABNORMAL LOW (ref 13.0–17.0)
Immature Granulocytes: 0 %
Lymphocytes Relative: 81 %
Lymphs Abs: 57.3 10*3/uL — ABNORMAL HIGH (ref 0.7–4.0)
MCH: 30.1 pg (ref 26.0–34.0)
MCHC: 30.2 g/dL (ref 30.0–36.0)
MCV: 99.7 fL (ref 80.0–100.0)
MONOS PCT: 12 %
Monocytes Absolute: 8.9 10*3/uL — ABNORMAL HIGH (ref 0.1–1.0)
NEUTROS PCT: 7 %
NRBC: 0 % (ref 0.0–0.2)
Neutro Abs: 5.2 10*3/uL (ref 1.7–7.7)
PLATELETS: 172 10*3/uL (ref 150–400)
RBC: 3.95 MIL/uL — ABNORMAL LOW (ref 4.22–5.81)
RDW: 15.2 % (ref 11.5–15.5)
WBC: 71.9 10*3/uL (ref 4.0–10.5)

## 2018-10-09 LAB — COMPREHENSIVE METABOLIC PANEL
ALBUMIN: 4.1 g/dL (ref 3.5–5.0)
ALT: 26 U/L (ref 0–44)
ANION GAP: 7 (ref 5–15)
AST: 17 U/L (ref 15–41)
Alkaline Phosphatase: 32 U/L — ABNORMAL LOW (ref 38–126)
BUN: 29 mg/dL — ABNORMAL HIGH (ref 8–23)
CO2: 23 mmol/L (ref 22–32)
Calcium: 9.2 mg/dL (ref 8.9–10.3)
Chloride: 107 mmol/L (ref 98–111)
Creatinine, Ser: 1.57 mg/dL — ABNORMAL HIGH (ref 0.61–1.24)
GFR calc Af Amer: 51 mL/min — ABNORMAL LOW (ref >60)
GFR calc non Af Amer: 44 mL/min — ABNORMAL LOW (ref >60)
GLUCOSE: 138 mg/dL — AB (ref 70–99)
Potassium: 4.3 mmol/L (ref 3.5–5.1)
SODIUM: 137 mmol/L (ref 135–145)
TOTAL PROTEIN: 6.2 g/dL — AB (ref 6.5–8.1)
Total Bilirubin: 0.5 mg/dL (ref 0.3–1.2)

## 2018-10-09 LAB — LACTATE DEHYDROGENASE: LDH: 179 U/L (ref 98–192)

## 2018-10-09 MED ORDER — HEPARIN SOD (PORK) LOCK FLUSH 100 UNIT/ML IV SOLN
INTRAVENOUS | Status: AC
  Filled 2018-10-09: qty 5

## 2018-10-09 NOTE — Progress Notes (Unsigned)
CRITICAL VALUE ALERT Critical value received:  WBC 71.9 Date of notification:  10-09-2018 Time of notification: 2548 Critical value read back:  Yes.   Nurse who received alert:  C.Freddie Nghiem RN MD notified (1st [place] Nitta):  Dr. Delton Coombes

## 2018-10-10 ENCOUNTER — Inpatient Hospital Stay (HOSPITAL_BASED_OUTPATIENT_CLINIC_OR_DEPARTMENT_OTHER): Payer: Medicare Other | Admitting: Hematology

## 2018-10-10 ENCOUNTER — Inpatient Hospital Stay (HOSPITAL_COMMUNITY): Payer: Medicare Other

## 2018-10-10 ENCOUNTER — Encounter (HOSPITAL_COMMUNITY): Payer: Self-pay | Admitting: Hematology

## 2018-10-10 DIAGNOSIS — D591 Other autoimmune hemolytic anemias: Secondary | ICD-10-CM | POA: Diagnosis not present

## 2018-10-10 DIAGNOSIS — Z79899 Other long term (current) drug therapy: Secondary | ICD-10-CM | POA: Diagnosis not present

## 2018-10-10 DIAGNOSIS — Z87891 Personal history of nicotine dependence: Secondary | ICD-10-CM

## 2018-10-10 DIAGNOSIS — Z85528 Personal history of other malignant neoplasm of kidney: Secondary | ICD-10-CM | POA: Diagnosis not present

## 2018-10-10 DIAGNOSIS — I10 Essential (primary) hypertension: Secondary | ICD-10-CM

## 2018-10-10 DIAGNOSIS — D801 Nonfamilial hypogammaglobulinemia: Secondary | ICD-10-CM

## 2018-10-10 DIAGNOSIS — C9110 Chronic lymphocytic leukemia of B-cell type not having achieved remission: Secondary | ICD-10-CM | POA: Diagnosis not present

## 2018-10-10 DIAGNOSIS — Z905 Acquired absence of kidney: Secondary | ICD-10-CM

## 2018-10-10 NOTE — Progress Notes (Signed)
No procrit needed per MD.

## 2018-10-10 NOTE — Progress Notes (Signed)
[name] Ball, [place] [id number]   CLINIC:  Medical Oncology/Hematology  PCP:  Alycia Rossetti, MD [address] [place] [address] [phone]   REASON FOR VISIT: Follow-up for CLL AND autoimmune hemolytic anemia  CURRENT THERAPY: prednisone 20 mg daily starting 10/21/18, procrit, and bactrim every other day   INTERVAL HISTORY:  [name] Ball 67 y.o. male returns for routine follow-up for CLL and autoimmune hemolytic anemia. Patient is here with his wife. He is doing well with no issues. He is still gaining weight due to the prednisone. Patients wife will be out of town for the next 2 weeks so she wants to wait and not make changes until she returns on the 16th of November. He denies any new pain. Denies any nausea, vomiting, or diarrhea. He denies any night sweats, chills, fevers, or weight loss. He reports his appetite at 100% and his energy level is 75%.     REVIEW OF SYSTEMS:  Review of Systems  All other systems reviewed and are negative.    PAST MEDICAL/SURGICAL HISTORY:  Past Medical History:  Diagnosis Date  . Allergy   . Arthritis   . Asthma   . Cancer ([facility])    kidney  . CLL (chronic lymphocytic leukemia) ([facility]) 2016  . COPD (chronic obstructive pulmonary disease) ([place])   . Diverticulitis   . Diverticulosis    colonoscopy 3/15  . GERD (gastroesophageal reflux disease)   . Hypertension   . Low testosterone   . Migraine   . Shingles 03/31/2016  . Solar purpura ([place])   . Thyroid disease    hypothryoid  . Ulcer   . Ventral hernia FEB 2015 CT    Large infra umbilical ventral hernia containing loops of small   Past Surgical History:  Procedure Laterality Date  . abdominal mesh inserted  june 2006, may 2008,dec 2010  . CHOLECYSTECTOMY  june 2007  . COLON SURGERY  sept 2007  . COLONOSCOPY    . COLONOSCOPY N/A 02/20/2014   Procedure: COLONOSCOPY;  Surgeon: Danie Binder, MD;  Location: [facility];  Service:  Endoscopy;  Laterality: N/A;  . HERNIA REPAIR  05/2005 5/20108, 11/2009   with mesh  . KIDNEY CYST REMOVAL  jan 2005   renal cell carcinoma  . knee minescus repair Left feb 2007  . NEPHRECTOMY Left 12/2003     SOCIAL HISTORY:  Social History   Socioeconomic History  . Marital status: Married    Spouse name: Zigmund Daniel  . Number of children: 4  . Years of education: 66  . Highest education level: Not on file  Occupational History    Comment: retired  Scientific laboratory technician  . Financial resource strain: Not on file  . Food insecurity:    Worry: Not on file    Inability: Not on file  . Transportation needs:    Medical: Not on file    Non-medical: Not on file  Tobacco Use  . Smoking status: Former Smoker    Last attempt to quit: 12/06/1996    Years since quitting: 21.8  . Smokeless tobacco: Never Used  Substance and Sexual Activity  . Alcohol use: Yes    Comment: a few shots a few times a month  . Drug use: No  . Sexual activity: Not on file  Lifestyle  . Physical activity:    Days per week: Not on file    Minutes per session: Not on file  .  Stress: Not on file  Relationships  . Social connections:    Talks on phone: Not on file    Gets together: Not on file    Attends religious service: Not on file    Active member of club or organization: Not on file    Attends meetings of clubs or organizations: Not on file    Relationship status: Not on file  . Intimate partner violence:    Fear of current or ex partner: Not on file    Emotionally abused: Not on file    Physically abused: Not on file    Forced sexual activity: Not on file  Other Topics Concern  . 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   Lives at home with wife, daughter, family   Caffeine- sodas, 2 daily    FAMILY HISTORY:  Family History  Problem Relation Age of Onset  . Cancer Mother        lung, met to brain  . Diabetes Mellitus II Sister   . Colon cancer  Neg Hx     CURRENT MEDICATIONS:  Outpatient Encounter Medications as of 10/10/2018  Medication Sig  . acetaminophen (TYLENOL) 325 MG tablet Take 650 mg by mouth every 6 (six) hours as needed.  Marland Kitchen albuterol (PROVENTIL) (2.5 MG/3ML) 0.083% nebulizer solution Take 3 mLs (2.5 mg total) by nebulization every 2 (two) hours as needed for wheezing or shortness of breath.  . allopurinol (ZYLOPRIM) 100 MG tablet TAKE 1 TABLET(100 MG) BY MOUTH AT BEDTIME  . amLODipine (NORVASC) 10 MG tablet TAKE 1 TABLET BY MOUTH DAILY  . ATROVENT HFA 17 MCG/ACT inhaler INHALE 2 PUFFS BY MOUTH INTO THE LUNGS EVERY 6 HOURS  . calcium carbonate (TUMS - DOSED IN MG ELEMENTAL CALCIUM) 500 MG chewable tablet Chew 2-3 tablets by mouth once as needed for indigestion or heartburn. Reported on 11/21/2015  . citalopram (CELEXA) 10 MG tablet TAKE 1 TABLET(10 MG) BY MOUTH DAILY  . clobetasol cream (TEMOVATE) 0.86 % Apply 1 application topically 2 (two) times daily.  . clopidogrel (PLAVIX) 75 MG tablet TAKE 1 TABLET(75 MG) BY MOUTH DAILY  . diphenhydrAMINE (BENADRYL) 25 MG tablet Take 25 mg by mouth every evening.   . folic acid (FOLVITE) 1 MG tablet Take 1 tablet (1 mg total) by mouth daily.  . Glucosamine-Chondroit-Vit C-Mn (GLUCOSAMINE 1500 COMPLEX) CAPS Take 1,200 capsules by mouth 2 (two) times daily.  Marland Kitchen HYDROcodone-acetaminophen (NORCO) 10-325 MG tablet Take 1-2 tablets by mouth every 8 (eight) hours as needed.  . levalbuterol (XOPENEX HFA) 45 MCG/ACT inhaler Inhale 1-2 puffs into the lungs every 4 (four) hours as needed for wheezing.  Marland Kitchen levothyroxine (SYNTHROID, LEVOTHROID) 50 MCG tablet TAKE 1 TABLET(50 MCG) BY MOUTH DAILY BEFORE BREAKFAST  . loratadine (CLARITIN) 10 MG tablet Take 10 mg by mouth every morning.   . metoprolol tartrate (LOPRESSOR) 50 MG tablet TAKE 1 TABLET(50 MG) BY MOUTH TWICE DAILY  . mometasone (ASMANEX 60 METERED DOSES) 220 MCG/INH inhaler Inhale 2 puffs into the lungs 2 (two) times daily.  . Multiple  Vitamin (MULTIVITAMIN WITH MINERALS) TABS tablet Take 1 tablet by mouth daily.  . Omega-3 Fatty Acids (FISH OIL) 1000 MG CPDR Take by mouth 2 (two) times daily.  . ondansetron (ZOFRAN) 4 MG tablet Take 1 tablet (4 mg total) by mouth every 8 (eight) hours as needed for nausea or vomiting.  . predniSONE (DELTASONE) 20 MG tablet Take 3  tablets (60 mg total) by mouth daily with breakfast.  . ranitidine (ZANTAC) 75 MG tablet Take 75 mg by mouth 2 (two) times daily.  Marland Kitchen sulfamethoxazole-trimethoprim (BACTRIM DS,SEPTRA DS) 800-160 MG tablet TAKE 1 TABLET BY MOUTH 3 TIMES A WEEK  . vitamin B-12 (CYANOCOBALAMIN) 1000 MCG tablet Take 1,000 mcg by mouth every morning.   . vitamin C (ASCORBIC ACID) 500 MG tablet Take 500 mg by mouth 2 (two) times daily.  . vitamin E 400 UNIT capsule Take 400 Units by mouth every morning.    No facility-administered encounter medications on file as of 10/10/2018.     ALLERGIES:  Allergies  Allergen Reactions  . Contrast Media [Iodinated Diagnostic Agents]     Single functioning kidney  . Other Hives    Neoprene fabric Strawberry   . Ether Rash    Took a long time to wake up Doesn't wake up from it     PHYSICAL EXAM:  ECOG Performance status: 1  Vitals:   10/10/18 0759  BP: (!) 145/78  Pulse: 96  Resp: 20  Temp: (!) 97.4 F (36.3 C)  SpO2: 95%   Filed Weights   10/10/18 0759  Weight: 264 lb (119.7 kg)    Physical Exam  Constitutional: He is oriented to person, place, and time. He appears well-developed and well-nourished.  Musculoskeletal: Normal range of motion.  Neurological: He is alert and oriented to person, place, and time.  Skin: Skin is warm and dry.  Psychiatric: He has a normal mood and affect. His behavior is normal. Judgment and thought content normal.     LABORATORY DATA:  I have reviewed the labs as listed.  CBC    Component Value Date/Time   WBC 71.9 (HH) 10/09/2018 1044   RBC 3.95 (L) 10/09/2018 1044   HGB 11.9 (L)  10/09/2018 1044   HCT 39.4 10/09/2018 1044   PLT 172 10/09/2018 1044   MCV 99.7 10/09/2018 1044   MCH 30.1 10/09/2018 1044   MCHC 30.2 10/09/2018 1044   RDW 15.2 10/09/2018 1044   LYMPHSABS 57.3 (H) 10/09/2018 1044   MONOABS 8.9 (H) 10/09/2018 1044   EOSABS 0.1 10/09/2018 1044   BASOSABS 0.2 (H) 10/09/2018 1044   CMP Latest Ref Rng & Units 10/09/2018 09/13/2018 06/21/2018  Glucose 70 - 99 mg/dL 138(H) 207(H) 187(H)  BUN 8 - 23 mg/dL 29(H) 29(H) 30(H)  Creatinine 0.61 - 1.24 mg/dL 1.57(H) 1.72(H) 1.45(H)  Sodium 135 - 145 mmol/L 137 137 139  Potassium 3.5 - 5.1 mmol/L 4.3 4.5 4.6  Chloride 98 - 111 mmol/L 107 102 106  CO2 22 - 32 mmol/L _0 Calcium 8.9 - 10.3 mg/dL 9.2 9.0 8.9  Total Protein 6.5 - 8.1 g/dL 6.2(L) 5.8(L) 5.7(L)  Total Bilirubin 0.3 - 1.2 mg/dL 0.5 0.7 0.8  Alkaline Phos 38 - 126 U/L 32(L) 29(L) 33(L)  AST 15 - 41 U/L 17 14(L) 14(L)  ALT 0 - 44 U/L _1 ASSESSMENT & PLAN:   CLL (chronic lymphocytic leukemia) ([facility]) 1.  Autoimmune hemolytic anemia:  -He developed autoimmune hemolytic anemia as a complication of CLL.  Direct Coombs test was positive for IgG and Cd3.  We have started him on prednisone 1 mg/kg rounded of to 100 mg daily on 05/19/2018.  He is tolerating it very well. 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  His LDH has normalized.  We have cut back on his Procrit to 30,000 units on 06/07/2018.  We have also tapered his prednisone to 80 mg daily on 06/14/2018. - His prednisone dose was reduced to 60 mg on 06/21/2018.  He went on a trip to [place] for last 4 weeks.  He did not feel any severe fatigue. -Prednisone was dose reduced to 40 mg daily on 08/02/2018. -Prednisone was further dose reduced to 30 mg daily on 09/13/2018.  He is tolerating it very well.  He complains of weight gain. -We reviewed the blood work from today.  Hemoglobin is 11.9.  White count is stable around 71. -I  have recommended to cut back on prednisone dose to 20 mg daily.  Patient's wife will be out of town for the next 10 days.  Hence he will start cutting back to 20 mg on the 16th of this month.  He will be seen back in 4 weeks with repeat blood work. -We will change his CBC checks to every 4 weeks.  Should he develop a relapse of his autoimmune hemolytic anemia, we will consider treatment for his CLL with ibrutinib or venetoclax and obinutuzumab.  2.  Hypogammaglobulinemia: Despite his IgG levels in the 400 range, he does not have any recurrent infections requiring antibiotics.  He was treated with IVIG in February 2017, which was discontinued secondary to high cost.  3.  Stage III CKD: He had a left nephrectomy for renal cell carcinoma in 2005.  He continues to be in remission.  His kidney function has been stable.  He will continue Procrit as needed for CKD.  4.  ID prophylaxis: - He will continue Bactrim 1 tablet 3 times a week, on Mondays, Wednesdays and Fridays.      Orders placed this encounter:  Orders Placed This Encounter  Procedures  . Reticulocytes  . Lactate dehydrogenase  . CBC with Differential/Platelet  . Comprehensive metabolic panel      Derek Jack, MD [place] [phone]

## 2018-10-10 NOTE — Assessment & Plan Note (Signed)
1.  Autoimmune hemolytic anemia:  -He developed autoimmune hemolytic anemia as a complication of CLL.  Direct Coombs test was positive for IgG and Cd3.  We have started him on prednisone 1 mg/kg rounded of to 100 mg daily on 05/19/2018.  He is tolerating it very well. 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  His LDH has normalized.  We have cut back on his Procrit to 30,000 units on 06/07/2018.  We have also tapered his prednisone to 80 mg daily on 06/14/2018. - His prednisone dose was reduced to 60 mg on 06/21/2018.  He went on a trip to [place] for last 4 weeks.  He did not feel any severe fatigue. -Prednisone was dose reduced to 40 mg daily on 08/02/2018. -Prednisone was further dose reduced to 30 mg daily on 09/13/2018.  He is tolerating it very well.  He complains of weight gain. -We reviewed the blood work from today.  Hemoglobin is 11.9.  White count is stable around 71. -I have recommended to cut back on prednisone dose to 20 mg daily.  Patient's wife will be out of town for the next 10 days.  Hence he will start cutting back to 20 mg on the 16th of this month.  He will be seen back in 4 weeks with repeat blood work. -We will change his CBC checks to every 4 weeks.  Should he develop a relapse of his autoimmune hemolytic anemia, we will consider treatment for his CLL with ibrutinib or venetoclax and obinutuzumab.  2.  Hypogammaglobulinemia: Despite his IgG levels in the 400 range, he does not have any recurrent infections requiring antibiotics.  He was treated with IVIG in February 2017, which was discontinued secondary to high cost.  3.  Stage III CKD: He had a left nephrectomy for renal cell carcinoma in 2005.  He continues to be in remission.  His kidney function has been stable.  He will continue Procrit as needed for CKD.  4.  ID prophylaxis: - He will continue Bactrim 1 tablet 3 times a week, on Mondays, Wednesdays and Fridays.

## 2018-10-10 NOTE — Patient Instructions (Addendum)
[place] at [facility]  Discharge Instructions: Follow up in 28 days with labs                                           Change prednisone to 20mg  on 11/16   _______________________________________________________________  Thank you for choosing [facility] to provide your oncology and hematology care.  To afford each patient quality time with our providers, please arrive at least 15 minutes before your scheduled appointment.  You need to re-schedule your appointment if you arrive 10 or more minutes late.  We strive to give you quality time with our providers, and arriving late affects you and other patients whose appointments are after yours.  Also, if you no show three or more times for appointments you may be dismissed from the clinic.  Again, thank you for choosing [facility] hope is that these requests will allow you access to exceptional care and in a timely manner. _______________________________________________________________  If you have questions after your visit, please contact our office at [phone] between the hours of 8:30 a.m. and 5:00 p.m. Voicemails left after 4:30 p.m. will not be returned until the following business day. _______________________________________________________________  For prescription refill requests, have your pharmacy contact our office. _______________________________________________________________  Recommendations made by the consultant and any test results will be sent to your referring physician. _______________________________________________________________

## 2018-10-27 DIAGNOSIS — G4733 Obstructive sleep apnea (adult) (pediatric): Secondary | ICD-10-CM | POA: Diagnosis not present

## 2018-10-30 ENCOUNTER — Other Ambulatory Visit: Payer: Self-pay | Admitting: Family Medicine

## 2018-11-08 ENCOUNTER — Encounter (HOSPITAL_COMMUNITY): Payer: Self-pay | Admitting: Hematology

## 2018-11-08 ENCOUNTER — Other Ambulatory Visit: Payer: Self-pay | Admitting: Family Medicine

## 2018-11-08 ENCOUNTER — Inpatient Hospital Stay (HOSPITAL_COMMUNITY): Payer: Medicare Other | Attending: Hematology | Admitting: Hematology

## 2018-11-08 ENCOUNTER — Inpatient Hospital Stay (HOSPITAL_COMMUNITY): Payer: Medicare Other

## 2018-11-08 ENCOUNTER — Other Ambulatory Visit: Payer: Self-pay

## 2018-11-08 DIAGNOSIS — C9110 Chronic lymphocytic leukemia of B-cell type not having achieved remission: Secondary | ICD-10-CM | POA: Insufficient documentation

## 2018-11-08 DIAGNOSIS — R197 Diarrhea, unspecified: Secondary | ICD-10-CM | POA: Diagnosis not present

## 2018-11-08 DIAGNOSIS — D591 Other autoimmune hemolytic anemias: Secondary | ICD-10-CM | POA: Insufficient documentation

## 2018-11-08 DIAGNOSIS — D801 Nonfamilial hypogammaglobulinemia: Secondary | ICD-10-CM | POA: Diagnosis not present

## 2018-11-08 DIAGNOSIS — N183 Chronic kidney disease, stage 3 (moderate): Secondary | ICD-10-CM | POA: Insufficient documentation

## 2018-11-08 LAB — CBC WITH DIFFERENTIAL/PLATELET
ABS IMMATURE GRANULOCYTES: 0.15 10*3/uL — AB (ref 0.00–0.07)
BASOS ABS: 0.2 10*3/uL — AB (ref 0.0–0.1)
Basophils Relative: 0 %
Eosinophils Absolute: 0.2 10*3/uL (ref 0.0–0.5)
Eosinophils Relative: 0 %
HCT: 39.5 % (ref 39.0–52.0)
Hemoglobin: 11.7 g/dL — ABNORMAL LOW (ref 13.0–17.0)
Immature Granulocytes: 0 %
LYMPHS ABS: 51.7 10*3/uL — AB (ref 0.7–4.0)
LYMPHS PCT: 81 %
MCH: 29.9 pg (ref 26.0–34.0)
MCHC: 29.6 g/dL — ABNORMAL LOW (ref 30.0–36.0)
MCV: 101 fL — ABNORMAL HIGH (ref 80.0–100.0)
MONO ABS: 8.3 10*3/uL — AB (ref 0.1–1.0)
MONOS PCT: 13 %
NRBC: 0 % (ref 0.0–0.2)
Neutro Abs: 4 10*3/uL (ref 1.7–7.7)
Neutrophils Relative %: 6 %
Platelets: 151 10*3/uL (ref 150–400)
RBC: 3.91 MIL/uL — ABNORMAL LOW (ref 4.22–5.81)
RDW: 14.9 % (ref 11.5–15.5)
WBC: 64.5 10*3/uL (ref 4.0–10.5)

## 2018-11-08 LAB — RETICULOCYTES
Immature Retic Fract: 23.8 % — ABNORMAL HIGH (ref 2.3–15.9)
RBC.: 3.91 MIL/uL — AB (ref 4.22–5.81)
RETIC COUNT ABSOLUTE: 56.9 10*3/uL (ref 19.0–186.0)
RETIC CT PCT: 1.4 % (ref 0.4–3.1)

## 2018-11-08 LAB — COMPREHENSIVE METABOLIC PANEL
ALBUMIN: 3.9 g/dL (ref 3.5–5.0)
ALT: 23 U/L (ref 0–44)
AST: 16 U/L (ref 15–41)
Alkaline Phosphatase: 29 U/L — ABNORMAL LOW (ref 38–126)
Anion gap: 8 (ref 5–15)
BILIRUBIN TOTAL: 0.4 mg/dL (ref 0.3–1.2)
BUN: 21 mg/dL (ref 8–23)
CHLORIDE: 107 mmol/L (ref 98–111)
CO2: 24 mmol/L (ref 22–32)
Calcium: 9 mg/dL (ref 8.9–10.3)
Creatinine, Ser: 1.69 mg/dL — ABNORMAL HIGH (ref 0.61–1.24)
GFR calc Af Amer: 47 mL/min — ABNORMAL LOW (ref >60)
GFR calc non Af Amer: 41 mL/min — ABNORMAL LOW (ref >60)
GLUCOSE: 117 mg/dL — AB (ref 70–99)
POTASSIUM: 4.7 mmol/L (ref 3.5–5.1)
Sodium: 139 mmol/L (ref 135–145)
Total Protein: 6.1 g/dL — ABNORMAL LOW (ref 6.5–8.1)

## 2018-11-08 LAB — LACTATE DEHYDROGENASE: LDH: 153 U/L (ref 98–192)

## 2018-11-08 MED ORDER — HYDROCODONE-ACETAMINOPHEN 10-325 MG PO TABS: 1.0000 | ORAL_TABLET | Freq: Three times a day (TID) | ORAL | 0 refills | Status: DC | PRN

## 2018-11-08 NOTE — Progress Notes (Signed)
Procrit held today for hemoglobin of 11.7.    CRITICAL VALUE ALERT  Critical Value:  WBC 64.5  Date & Time Notied:  11/08/18 at 1020  Provider Notified: Dr. Delton Coombes  Orders Received/Actions taken: n/a

## 2018-11-08 NOTE — Progress Notes (Signed)
[facility], [place] [address]   CLINIC:  Medical Oncology/Hematology  PCP:  Alycia Rossetti, MD [address] [place] [address] [phone]   REASON FOR VISIT: Follow-up for CLL AND autoimmune hemolytic anemia  CURRENT THERAPY: prednisone 10 mg every other day alternating with 20 mg  starting 10/21/18, procrit, and bactrim every other day   INTERVAL HISTORY:  Mr. Stehlin 67 y.o. male returns for routine follow-up CLL AND autoimmune hemolytic anemia. He is here today with his wife. He is doing well he still has the shakiness and fatigue during the day. He has occasional diarrhea and his numbness in his hands and feet are stable. He denies any nausea or vomiting. Denies any headaches or vision changes. Denies any fevers. He reports his appetite at 100% and has no problem maintaining his weight. His energy level is zero.     REVIEW OF SYSTEMS:  Review of Systems  Constitutional: Positive for fatigue.  Gastrointestinal: Positive for diarrhea.  Neurological: Positive for numbness.  All other systems reviewed and are negative.    PAST MEDICAL/SURGICAL HISTORY:  Past Medical History:  Diagnosis Date  . Allergy   . Arthritis   . Asthma   . Cancer ([place])    kidney  . CLL (chronic lymphocytic leukemia) ([place]) 2016  . COPD (chronic obstructive pulmonary disease) ([place])   . Diverticulitis   . Diverticulosis    colonoscopy 3/15  . GERD (gastroesophageal reflux disease)   . Hypertension   . Low testosterone   . Migraine   . Shingles 03/31/2016  . Solar purpura ([place])   . Thyroid disease    hypothryoid  . Ulcer   . Ventral hernia FEB 2015 CT    Large infra umbilical ventral hernia containing loops of small   Past Surgical History:  Procedure Laterality Date  . abdominal mesh inserted  june 2006, may 2008,dec 2010  . CHOLECYSTECTOMY  june 2007  . COLON SURGERY  sept 2007  . COLONOSCOPY    . COLONOSCOPY N/A 02/20/2014   Procedure: COLONOSCOPY;  Surgeon: Danie Binder, MD;  Location: [facility];  Service: Endoscopy;  Laterality: N/A;  . HERNIA REPAIR  05/2005 5/20108, 11/2009   with mesh  . KIDNEY CYST REMOVAL  jan 2005   renal cell carcinoma  . knee minescus repair Left feb 2007  . NEPHRECTOMY Left 12/2003     SOCIAL HISTORY:  Social History   Socioeconomic History  . Marital status: Married    Spouse name: Zigmund Daniel  . Number of children: 4  . Years of education: 52  . Highest education level: Not on file  Occupational History    Comment: retired  Scientific laboratory technician  . Financial resource strain: Not on file  . Food insecurity:    Worry: Not on file    Inability: Not on file  . Transportation needs:    Medical: Not on file    Non-medical: Not on file  Tobacco Use  . Smoking status: Former Smoker    Last attempt to quit: 12/06/1996    Years since quitting: 21.9  . Smokeless tobacco: Never Used  Substance and Sexual Activity  . Alcohol use: Yes    Comment: a few shots a few times a month  . Drug use: No  . Sexual activity: Not on file  Lifestyle  . Physical activity:    Days per week: Not on file    Minutes per session: Not  on file  . Stress: Not on file  Relationships  . Social connections:    Talks on phone: Not on file    Gets together: Not on file    Attends religious service: Not on file    Active member of club or organization: Not on file    Attends meetings of clubs or organizations: Not on file    Relationship status: Not on file  . Intimate partner violence:    Fear of current or ex partner: Not on file    Emotionally abused: Not on file    Physically abused: Not on file    Forced sexual activity: Not on file  Other Topics Concern  . 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   Lives at home with wife, daughter, family   Caffeine- sodas, 2 daily    FAMILY HISTORY:  Family History  Problem Relation Age of Onset  .  Cancer Mother        lung, met to brain  . Diabetes Mellitus II Sister   . Colon cancer Neg Hx     CURRENT MEDICATIONS:  Outpatient Encounter Medications as of 11/08/2018  Medication Sig  . acetaminophen (TYLENOL) 325 MG tablet Take 650 mg by mouth every 6 (six) hours as needed.  Marland Kitchen albuterol (PROVENTIL) (2.5 MG/3ML) 0.083% nebulizer solution Take 3 mLs (2.5 mg total) by nebulization every 2 (two) hours as needed for wheezing or shortness of breath.  . allopurinol (ZYLOPRIM) 100 MG tablet TAKE 1 TABLET(100 MG) BY MOUTH AT BEDTIME  . amLODipine (NORVASC) 10 MG tablet TAKE 1 TABLET BY MOUTH DAILY  . ATROVENT HFA 17 MCG/ACT inhaler INHALE 2 PUFFS BY MOUTH INTO THE LUNGS EVERY 6 HOURS  . calcium carbonate (TUMS - DOSED IN MG ELEMENTAL CALCIUM) 500 MG chewable tablet Chew 2-3 tablets by mouth once as needed for indigestion or heartburn. Reported on 11/21/2015  . cimetidine (TAGAMET) 200 MG tablet Take 200 mg by mouth at bedtime.  . citalopram (CELEXA) 10 MG tablet TAKE 1 TABLET(10 MG) BY MOUTH DAILY  . clobetasol cream (TEMOVATE) 6.81 % Apply 1 application topically 2 (two) times daily.  . clopidogrel (PLAVIX) 75 MG tablet TAKE 1 TABLET(75 MG) BY MOUTH DAILY  . diphenhydrAMINE (BENADRYL) 25 MG tablet Take 25 mg by mouth every evening.   . folic acid (FOLVITE) 1 MG tablet Take 1 tablet (1 mg total) by mouth daily.  . Glucosamine-Chondroit-Vit C-Mn (GLUCOSAMINE 1500 COMPLEX) CAPS Take 1,200 capsules by mouth 2 (two) times daily.  Marland Kitchen HYDROcodone-acetaminophen (NORCO) 10-325 MG tablet Take 1-2 tablets by mouth every 8 (eight) hours as needed.  . levalbuterol (XOPENEX HFA) 45 MCG/ACT inhaler Inhale 1-2 puffs into the lungs every 4 (four) hours as needed for wheezing.  Marland Kitchen levothyroxine (SYNTHROID, LEVOTHROID) 50 MCG tablet TAKE 1 TABLET(50 MCG) BY MOUTH DAILY BEFORE BREAKFAST  . loratadine (CLARITIN) 10 MG tablet Take 10 mg by mouth every morning.   . metoprolol tartrate (LOPRESSOR) 50 MG tablet TAKE 1  TABLET(50 MG) BY MOUTH TWICE DAILY  . mometasone (ASMANEX 60 METERED DOSES) 220 MCG/INH inhaler Inhale 2 puffs into the lungs 2 (two) times daily.  . Multiple Vitamin (MULTIVITAMIN WITH MINERALS) TABS tablet Take 1 tablet by mouth daily.  . Omega-3 Fatty Acids (FISH OIL) 1000 MG CPDR Take by mouth 2 (two) times daily.  . ondansetron (ZOFRAN) 4 MG tablet Take 1 tablet (4 mg total) by mouth every  8 (eight) hours as needed for nausea or vomiting.  . predniSONE (DELTASONE) 20 MG tablet Take 3 tablets (60 mg total) by mouth daily with breakfast.  . sulfamethoxazole-trimethoprim (BACTRIM DS,SEPTRA DS) 800-160 MG tablet TAKE 1 TABLET BY MOUTH 3 TIMES A WEEK  . vitamin B-12 (CYANOCOBALAMIN) 1000 MCG tablet Take 1,000 mcg by mouth every morning.   . vitamin C (ASCORBIC ACID) 500 MG tablet Take 500 mg by mouth 2 (two) times daily.  . vitamin E 400 UNIT capsule Take 400 Units by mouth every morning.   . [DISCONTINUED] ranitidine (ZANTAC) 75 MG tablet Take 75 mg by mouth 2 (two) times daily.   No facility-administered encounter medications on file as of 11/08/2018.     ALLERGIES:  Allergies  Allergen Reactions  . Contrast Media [Iodinated Diagnostic Agents]     Single functioning kidney  . Other Hives    Neoprene fabric Strawberry   . Ether Rash    Took a long time to wake up Doesn't wake up from it     PHYSICAL EXAM:  ECOG Performance status: 1  Vitals:   11/08/18 1012  BP: (!) 119/53  Pulse: 71  Resp: 20  Temp: 98.1 F (36.7 C)  SpO2: 94%   Filed Weights   11/08/18 1012  Weight: 266 lb 4.8 oz (120.8 kg)    Physical Exam  Constitutional: He is oriented to person, place, and time. He appears well-developed and well-nourished.  Musculoskeletal: Normal range of motion.  Neurological: He is alert and oriented to person, place, and time.  Skin: Skin is warm and dry.  Psychiatric: He has a normal mood and affect. His behavior is normal. Judgment and thought content normal.      LABORATORY DATA:  I have reviewed the labs as listed.  CBC    Component Value Date/Time   WBC 64.5 (HH) 11/08/2018 0928   RBC 3.91 (L) 11/08/2018 0928   RBC 3.91 (L) 11/08/2018 0928   HGB 11.7 (L) 11/08/2018 0928   HCT 39.5 11/08/2018 0928   PLT 151 11/08/2018 0928   MCV 101.0 (H) 11/08/2018 0928   MCH 29.9 11/08/2018 0928   MCHC 29.6 (L) 11/08/2018 0928   RDW 14.9 11/08/2018 0928   LYMPHSABS 51.7 (H) 11/08/2018 0928   MONOABS 8.3 (H) 11/08/2018 0928   EOSABS 0.2 11/08/2018 0928   BASOSABS 0.2 (H) 11/08/2018 0928   CMP Latest Ref Rng & Units 11/08/2018 10/09/2018 09/13/2018  Glucose 70 - 99 mg/dL 117(H) 138(H) 207(H)  BUN 8 - 23 mg/dL 21 29(H) 29(H)  Creatinine 0.61 - 1.24 mg/dL 1.69(H) 1.57(H) 1.72(H)  Sodium 135 - 145 mmol/L 139 137 137  Potassium 3.5 - 5.1 mmol/L 4.7 4.3 4.5  Chloride 98 - 111 mmol/L 107 107 102  CO2 22 - 32 mmol/L _0 Calcium 8.9 - 10.3 mg/dL 9.0 9.2 9.0  Total Protein 6.5 - 8.1 g/dL 6.1(L) 6.2(L) 5.8(L)  Total Bilirubin 0.3 - 1.2 mg/dL 0.4 0.5 0.7  Alkaline Phos 38 - 126 U/L 29(L) 32(L) 29(L)  AST 15 - 41 U/L 16 17 14(L)  ALT 0 - 44 U/L _1 I have reviewed Francene Finders, NP's note and agree with the documentation.  I personally performed a face-to-face visit, made revisions and my assessment and plan is as follows.      ASSESSMENT & PLAN:   CLL (chronic lymphocytic leukemia) ([place]) 1.  Autoimmune hemolytic anemia:  -He developed autoimmune hemolytic anemia as a complication  of CLL.  Direct Coombs test was positive for IgG and Cd3.  We have started him on prednisone 1 mg/kg rounded of to 100 mg daily on 05/19/2018.  He is tolerating it very well. 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  His LDH has normalized.  We have cut back on his Procrit to 30,000 units on 06/07/2018.  We have also tapered his prednisone to 80 mg daily on 06/14/2018. - His prednisone dose was reduced  to 60 mg on 06/21/2018.  He went on a trip to [place] for last 4 weeks.  He did not feel any severe fatigue. -Prednisone was dose reduced to 40 mg daily on 08/02/2018. -Prednisone was further dose reduced to 30 mg daily on 09/13/2018.  - Prednisone was decreased to 20 mg daily on 10/22/2018.  He is doing very well.  He does complain of shakiness and tiredness.  He also gained weight. - We reviewed his blood work today.  Hemoglobin is 11.7.  I will further cut back the dose of prednisone to 10 mg alternating with 20 mg daily. -We will see him back in 4 weeks for follow-up.  If he continues to have a stable hemoglobin at that time, I will further decrease the prednisone to 10 mg daily.  2.  Hypogammaglobulinemia: Despite his IgG levels in the 400 range, he does not have any recurrent infections requiring antibiotics.  He was treated with IVIG in February 2017, which was discontinued secondary to high cost.  3.  Stage III CKD:  -She had left nephrectomy for renal cell carcinoma in 2005. -His elevated creatinine has been stable.  He is not requiring any Procrit.    4.  ID prophylaxis: - He will continue Bactrim 1 tablet 3 times a week as long as he is on steroids.      Orders placed this encounter:  Orders Placed This Encounter  Procedures  . CBC with Differential/Platelet  . Comprehensive metabolic panel  . Lactate dehydrogenase  . Reticulocytes      Derek Jack, MD [facility] [phone]

## 2018-11-08 NOTE — Telephone Encounter (Signed)
Ok to refill??  Last office visit 04/07/2018.  Last refill 10/26/2018.

## 2018-11-08 NOTE — Patient Instructions (Signed)
[place] at [facility] Discharge Instructions  Follow up with [place] in 4 weeks with labs    Thank you for choosing [place] at [facility] to provide your oncology and hematology care.  To afford each patient quality time with our provider, please arrive at least 15 minutes before your scheduled appointment time.   If you have a lab appointment with the [facility] please come in thru the  Main Entrance and check in at the main information desk  You need to re-schedule your appointment should you arrive 10 or more minutes late.  We strive to give you quality time with our providers, and arriving late affects you and other patients whose appointments are after yours.  Also, if you no show three or more times for appointments you may be dismissed from the clinic at the providers discretion.     Again, thank you for choosing [facility].  Our hope is that these requests will decrease the amount of time that you wait before being seen by our physicians.       _____________________________________________________________  Should you have questions after your visit to [facility], please contact our office at [phone] between the hours of 8:00 a.m. and 4:30 p.m.  Voicemails left after 4:00 p.m. will not be returned until the following business day.  For prescription refill requests, have your pharmacy contact our office and allow 72 hours.    Cancer Center Support Programs:   > Cancer Support Group  2nd Tuesday of the month 1pm-2pm, Journey Room

## 2018-11-08 NOTE — Assessment & Plan Note (Signed)
1.  Autoimmune hemolytic anemia:  -He developed autoimmune hemolytic anemia as a complication of CLL.  Direct Coombs test was positive for IgG and Cd3.  We have started him on prednisone 1 mg/kg rounded of to 100 mg daily on 05/19/2018.  He is tolerating it very well. 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  His LDH has normalized.  We have cut back on his Procrit to 30,000 units on 06/07/2018.  We have also tapered his prednisone to 80 mg daily on 06/14/2018. - His prednisone dose was reduced to 60 mg on 06/21/2018.  He went on a trip to [place] for last 4 weeks.  He did not feel any severe fatigue. -Prednisone was dose reduced to 40 mg daily on 08/02/2018. -Prednisone was further dose reduced to 30 mg daily on 09/13/2018.  - Prednisone was decreased to 20 mg daily on 10/22/2018.  He is doing very well.  He does complain of shakiness and tiredness.  He also gained weight. - We reviewed his blood work today.  Hemoglobin is 11.7.  I will further cut back the dose of prednisone to 10 mg alternating with 20 mg daily. -We will see him back in 4 weeks for follow-up.  If he continues to have a stable hemoglobin at that time, I will further decrease the prednisone to 10 mg daily.  2.  Hypogammaglobulinemia: Despite his IgG levels in the 400 range, he does not have any recurrent infections requiring antibiotics.  He was treated with IVIG in February 2017, which was discontinued secondary to high cost.  3.  Stage III CKD:  -She had left nephrectomy for renal cell carcinoma in 2005. -His elevated creatinine has been stable.  He is not requiring any Procrit.    4.  ID prophylaxis: - He will continue Bactrim 1 tablet 3 times a week as long as he is on steroids.

## 2018-11-20 DIAGNOSIS — G4733 Obstructive sleep apnea (adult) (pediatric): Secondary | ICD-10-CM | POA: Diagnosis not present

## 2018-11-22 ENCOUNTER — Encounter (INDEPENDENT_AMBULATORY_CARE_PROVIDER_SITE_OTHER): Payer: Self-pay | Admitting: Orthopaedic Surgery

## 2018-11-22 ENCOUNTER — Ambulatory Visit (INDEPENDENT_AMBULATORY_CARE_PROVIDER_SITE_OTHER): Payer: Medicare Other | Admitting: Orthopaedic Surgery

## 2018-11-22 VITALS — BP 116/68 | HR 66 | Resp 17 | Ht 67.0 in | Wt 269.0 lb

## 2018-11-22 DIAGNOSIS — G8929 Other chronic pain: Secondary | ICD-10-CM | POA: Diagnosis not present

## 2018-11-22 DIAGNOSIS — M25561 Pain in right knee: Secondary | ICD-10-CM | POA: Diagnosis not present

## 2018-11-22 MED ORDER — LIDOCAINE HCL 1 % IJ SOLN
2.0000 mL | INTRAMUSCULAR | Status: AC | PRN
  Administered 2018-11-22: 2 mL

## 2018-11-22 MED ORDER — METHYLPREDNISOLONE ACETATE 40 MG/ML IJ SUSP
80.0000 mg | INTRAMUSCULAR | Status: AC | PRN
  Administered 2018-11-22: 80 mg

## 2018-11-22 MED ORDER — BUPIVACAINE HCL 0.5 % IJ SOLN
2.0000 mL | INTRAMUSCULAR | Status: AC | PRN
  Administered 2018-11-22: 2 mL via INTRA_ARTICULAR

## 2018-11-22 NOTE — Progress Notes (Signed)
 Office Visit Note   Patient: [name] Ball           Date of Birth: 03/05/1951           MRN: [id number] Visit Date: 11/22/2018              Requested by: [place], Kawanta F, MD [address] [place], [place] [address] PCP: [place], Kawanta F, MD   Assessment & Plan: Visit Diagnoses:  1. Chronic pain of right knee     Plan: Recurrent pain from advanced osteoarthritis right knee.  Will reinject with cortisone and monitor response  Follow-Up Instructions: Return if symptoms worsen or fail to improve.   Orders:  Orders Placed This Encounter  Procedures  . Large Joint Inj: R knee   No orders of the defined types were placed in this encounter.     Procedures: Large Joint Inj: R knee on 11/22/2018 8:32 AM Indications: pain and diagnostic evaluation Details: 25 G 1.5 in needle, anteromedial approach  Arthrogram: No  Medications: 2 mL lidocaine 1 %; 2 mL bupivacaine 0.5 %; 80 mg methylPREDNISolone acetate 40 MG/ML Procedure, treatment alternatives, risks and benefits explained, specific risks discussed. Consent was given by the patient. Immediately prior to procedure a time out was called to verify the correct patient, procedure, equipment, support staff and site/side marked as required. Patient was prepped and draped in the usual sterile fashion.       Clinical Data: No additional findings.   Subjective: Chief Complaint  Patient presents with  . Right Knee - Pain  [name] was seen several months ago for evaluation of right knee pain.  Films demonstrated advanced osteoarthritis.  I injected his knee with good results.  Over the last several weeks has had some recurrent pain.  He does use a cane to aid with his ambulation.  No history of injury or trauma  HPI  Review of Systems  Constitutional: Negative for chills and fatigue.  HENT: Negative for sinus pressure and sinus pain.   Eyes: Negative for pain and redness.  Respiratory: Negative for chest tightness  and shortness of breath.   Cardiovascular: Negative for chest pain.  Gastrointestinal: Negative for abdominal pain, constipation, diarrhea and nausea.  Endocrine: Negative for polyuria.  Genitourinary: Negative for difficulty urinating, hematuria and urgency.  Musculoskeletal: Positive for gait problem and joint swelling.  Skin: Negative for rash.  Allergic/Immunologic: Negative for immunocompromised state.  Neurological: Negative for dizziness, weakness and headaches.  Hematological: Bruises/bleeds easily.  Psychiatric/Behavioral: Negative for confusion. The patient is not nervous/anxious.      Objective: Vital Signs: BP 116/68 (BP Location: Left Arm, Patient Position: Sitting, Cuff Size: Normal)   Pulse 66   Resp 17   Ht 5' 7" (1.702 m)   Wt 269 lb (122 kg)   BMI 42.13 kg/m   Physical Exam Constitutional:      Appearance: He is well-developed.  Eyes:     Pupils: Pupils are equal, round, and reactive to light.  Pulmonary:     Effort: Pulmonary effort is normal.  Skin:    General: Skin is warm and dry.  Neurological:     Mental Status: He is alert and oriented to person, place, and time.  Psychiatric:        Behavior: Behavior normal.     Ortho Exam right knee with small effusion.  Lacks just a few degrees to full extension but no instability.  Some chronic swelling distally in the calf.    Skin was intact  Specialty Comments:  No specialty comments available.  Imaging: No results found.   PMFS History: Patient Active Problem List   Diagnosis Date Noted  . Chronic pain of right knee 11/22/2018  . History of CVA (cerebrovascular accident) 03/18/2017  . Complicated migraine 66/29/4765  . Asthma 03/09/2017  . Hypogammaglobulinemia ([place]) 01/27/2016  . CLL (chronic lymphocytic leukemia) ([place]) 11/10/2015  . Prediabetes 04/08/2015  . Depression 12/10/2014  . Low testosterone 12/10/2014  . Essential hypertension, benign 08/09/2014  . Sleep apnea 08/09/2014  .  Onychomycosis 08/09/2014  . Diverticulosis of colon with hemorrhage 02/20/2014  . GI bleed 02/18/2014  . Recurrent ventral incisional hernia 01/21/2014  . Abdominal wall hernia 11/19/2013  . Hand dermatitis 09/18/2013  . Hypothyroidism 07/17/2013  . CKD (chronic kidney disease), stage III ([place]) 07/17/2013  . Obesity 07/17/2013  . Chronic pain syndrome 07/17/2013  . COPD with asthma ([place]) 07/17/2013  . GERD (gastroesophageal reflux disease) 07/17/2013   Past Medical History:  Diagnosis Date  . Allergy   . Arthritis   . Asthma   . Cancer ([place])    kidney  . CLL (chronic lymphocytic leukemia) ([place]) 2016  . COPD (chronic obstructive pulmonary disease) ([place])   . Diverticulitis   . Diverticulosis    colonoscopy 3/15  . GERD (gastroesophageal reflux disease)   . Hypertension   . Low testosterone   . Migraine   . Shingles 03/31/2016  . Solar purpura ([place])   . Thyroid disease    hypothryoid  . Ulcer   . Ventral hernia FEB 2015 CT    Large infra umbilical ventral hernia containing loops of small    Family History  Problem Relation Age of Onset  . Cancer Mother        lung, met to brain  . Diabetes Mellitus II Sister   . Colon cancer Neg Hx     Past Surgical History:  Procedure Laterality Date  . abdominal mesh inserted  june 2006, may 2008,dec 2010  . CHOLECYSTECTOMY  june 2007  . COLON SURGERY  sept 2007  . COLONOSCOPY    . COLONOSCOPY N/A 02/20/2014   Procedure: COLONOSCOPY;  Surgeon: Danie Binder, MD;  Location: [facility];  Service: Endoscopy;  Laterality: N/A;  . HERNIA REPAIR  05/2005 5/20108, 11/2009   with mesh  . KIDNEY CYST REMOVAL  jan 2005   renal cell carcinoma  . knee minescus repair Left feb 2007  . NEPHRECTOMY Left 12/2003   Social History   Occupational History    Comment: retired  Tobacco Use  . Smoking status: Former Smoker    Last attempt to quit: 12/06/1996    Years since quitting: 21.9  . Smokeless tobacco: Never Used  Substance and  Sexual Activity  . Alcohol use: Yes    Comment: a few shots a few times a month  . Drug use: No  . Sexual activity: Not on file

## 2018-11-30 ENCOUNTER — Other Ambulatory Visit: Payer: Self-pay | Admitting: Family Medicine

## 2018-12-05 ENCOUNTER — Emergency Department (HOSPITAL_COMMUNITY)
Admission: EM | Admit: 2018-12-05 | Discharge: 2018-12-05 | Disposition: A | Discharge status: Home / Self Care | Payer: Medicare Other | Attending: Emergency Medicine | Admitting: Emergency Medicine

## 2018-12-05 ENCOUNTER — Telehealth: Payer: Self-pay | Admitting: *Deleted

## 2018-12-05 ENCOUNTER — Encounter (HOSPITAL_COMMUNITY): Payer: Self-pay

## 2018-12-05 ENCOUNTER — Other Ambulatory Visit: Payer: Self-pay

## 2018-12-05 DIAGNOSIS — Z7902 Long term (current) use of antithrombotics/antiplatelets: Secondary | ICD-10-CM | POA: Diagnosis not present

## 2018-12-05 DIAGNOSIS — L03012 Cellulitis of left finger: Secondary | ICD-10-CM | POA: Diagnosis not present

## 2018-12-05 DIAGNOSIS — E039 Hypothyroidism, unspecified: Secondary | ICD-10-CM | POA: Insufficient documentation

## 2018-12-05 DIAGNOSIS — N183 Chronic kidney disease, stage 3 (moderate): Secondary | ICD-10-CM | POA: Insufficient documentation

## 2018-12-05 DIAGNOSIS — J449 Chronic obstructive pulmonary disease, unspecified: Secondary | ICD-10-CM | POA: Insufficient documentation

## 2018-12-05 DIAGNOSIS — Z79899 Other long term (current) drug therapy: Secondary | ICD-10-CM | POA: Insufficient documentation

## 2018-12-05 DIAGNOSIS — I129 Hypertensive chronic kidney disease with stage 1 through stage 4 chronic kidney disease, or unspecified chronic kidney disease: Secondary | ICD-10-CM | POA: Insufficient documentation

## 2018-12-05 DIAGNOSIS — M79642 Pain in left hand: Secondary | ICD-10-CM | POA: Diagnosis present

## 2018-12-05 DIAGNOSIS — C9110 Chronic lymphocytic leukemia of B-cell type not having achieved remission: Secondary | ICD-10-CM | POA: Insufficient documentation

## 2018-12-05 LAB — CBC WITH DIFFERENTIAL/PLATELET
Abs Immature Granulocytes: 0.11 10*3/uL — ABNORMAL HIGH (ref 0.00–0.07)
Basophils Absolute: 0.1 10*3/uL (ref 0.0–0.1)
Basophils Relative: 0 %
EOS ABS: 0 10*3/uL (ref 0.0–0.5)
EOS PCT: 0 %
HCT: 40.1 % (ref 39.0–52.0)
Hemoglobin: 12.2 g/dL — ABNORMAL LOW (ref 13.0–17.0)
Immature Granulocytes: 0 %
Lymphocytes Relative: 80 %
Lymphs Abs: 34.8 10*3/uL — ABNORMAL HIGH (ref 0.7–4.0)
MCH: 29.6 pg (ref 26.0–34.0)
MCHC: 30.4 g/dL (ref 30.0–36.0)
MCV: 97.3 fL (ref 80.0–100.0)
Monocytes Absolute: 4.1 10*3/uL — ABNORMAL HIGH (ref 0.1–1.0)
Monocytes Relative: 9 %
Neutro Abs: 4.8 10*3/uL (ref 1.7–7.7)
Neutrophils Relative %: 11 %
Platelets: 153 10*3/uL (ref 150–400)
RBC: 4.12 MIL/uL — ABNORMAL LOW (ref 4.22–5.81)
RDW: 14.2 % (ref 11.5–15.5)
WBC: 44 10*3/uL — ABNORMAL HIGH (ref 4.0–10.5)
nRBC: 0 % (ref 0.0–0.2)

## 2018-12-05 LAB — COMPREHENSIVE METABOLIC PANEL
ALT: 21 U/L (ref 0–44)
ANION GAP: 7 (ref 5–15)
AST: 18 U/L (ref 15–41)
Albumin: 4 g/dL (ref 3.5–5.0)
Alkaline Phosphatase: 29 U/L — ABNORMAL LOW (ref 38–126)
BUN: 30 mg/dL — ABNORMAL HIGH (ref 8–23)
CO2: 22 mmol/L (ref 22–32)
Calcium: 9.1 mg/dL (ref 8.9–10.3)
Chloride: 111 mmol/L (ref 98–111)
Creatinine, Ser: 1.75 mg/dL — ABNORMAL HIGH (ref 0.61–1.24)
GFR calc Af Amer: 45 mL/min — ABNORMAL LOW (ref >60)
GFR, EST NON AFRICAN AMERICAN: 39 mL/min — AB (ref >60)
Glucose, Bld: 140 mg/dL — ABNORMAL HIGH (ref 70–99)
Potassium: 5 mmol/L (ref 3.5–5.1)
Sodium: 140 mmol/L (ref 135–145)
Total Bilirubin: 0.4 mg/dL (ref 0.3–1.2)
Total Protein: 5.9 g/dL — ABNORMAL LOW (ref 6.5–8.1)

## 2018-12-05 MED ORDER — CEPHALEXIN 500 MG PO CAPS: 500.0000 mg | ORAL_CAPSULE | Freq: Two times a day (BID) | ORAL | 0 refills | Status: DC

## 2018-12-05 MED ORDER — LIDOCAINE HCL (PF) 2 % IJ SOLN
INTRAMUSCULAR | Status: AC
  Filled 2018-12-05: qty 10

## 2018-12-05 NOTE — Discharge Instructions (Signed)
Keep dressing in place Return if increased swelling, redness, fever, or streaking Recheck with your doctor 2-3 days

## 2018-12-05 NOTE — ED Provider Notes (Signed)
[facility] EMERGENCY DEPARTMENT Provider Note   CSN: [id number] Arrival date & time: 12/05/18  1718     History   Chief Complaint Chief Complaint  Patient presents with  . Hand Pain    ? infection    HPI [name] Ball is a 67 y.o. male.  HPI  67 year old male history of CLL presents with swelling and pain to the left thumb paronychia.  He describes feeling of a hangnail on Saturday.  Began feeling somewhat red on Sunday.  His friend placed antibiotic ointment and dressing.  He had increased swelling on Monday and discussed with his primary care doctor via phone.  He has continued to have swelling and presents to the ED today secondary to this.  His primary care physician stated that since he was on prednisone and is on Bactrim prophylactically 3 times a week he should be seen for evaluation.  He has noted some increased swelling of the paronychia with some mild redness surrounding it.  Is not any discharge.  Instructed not to water with Epson salts.  Denies any fever, systemic symptoms, nausea vomiting or chills  Past Medical History:  Diagnosis Date  . Allergy   . Arthritis   . Asthma   . Cancer ([place])    kidney  . CLL (chronic lymphocytic leukemia) ([place]) 2016  . COPD (chronic obstructive pulmonary disease) ([place])   . Diverticulitis   . Diverticulosis    colonoscopy 3/15  . GERD (gastroesophageal reflux disease)   . Hypertension   . Low testosterone   . Migraine   . Shingles 03/31/2016  . Solar purpura ([place])   . Thyroid disease    hypothryoid  . Ulcer   . Ventral hernia FEB 2015 CT    Large infra umbilical ventral hernia containing loops of small    Patient Active Problem List   Diagnosis Date Noted  . Chronic pain of right knee 11/22/2018  . History of CVA (cerebrovascular accident) 03/18/2017  . Complicated migraine 49/20/1007  . Asthma 03/09/2017  . Hypogammaglobulinemia ([place]) 01/27/2016  . CLL (chronic lymphocytic leukemia) ([place]) 11/10/2015  .  Prediabetes 04/08/2015  . Depression 12/10/2014  . Low testosterone 12/10/2014  . Essential hypertension, benign 08/09/2014  . Sleep apnea 08/09/2014  . Onychomycosis 08/09/2014  . Diverticulosis of colon with hemorrhage 02/20/2014  . GI bleed 02/18/2014  . Recurrent ventral incisional hernia 01/21/2014  . Abdominal wall hernia 11/19/2013  . Hand dermatitis 09/18/2013  . Hypothyroidism 07/17/2013  . CKD (chronic kidney disease), stage III ([place]) 07/17/2013  . Obesity 07/17/2013  . Chronic pain syndrome 07/17/2013  . COPD with asthma ([place]) 07/17/2013  . GERD (gastroesophageal reflux disease) 07/17/2013    Past Surgical History:  Procedure Laterality Date  . abdominal mesh inserted  june 2006, may 2008,dec 2010  . CHOLECYSTECTOMY  june 2007  . COLON SURGERY  sept 2007  . COLONOSCOPY    . COLONOSCOPY N/A 02/20/2014   Procedure: COLONOSCOPY;  Surgeon: Danie Binder, MD;  Location: [facility] ENDO SUITE;  Service: Endoscopy;  Laterality: N/A;  . HERNIA REPAIR  05/2005 5/20108, 11/2009   with mesh  . KIDNEY CYST REMOVAL  jan 2005   renal cell carcinoma  . knee minescus repair Left feb 2007  . NEPHRECTOMY Left 12/2003        Home Medications    Prior to Admission medications   Medication Sig Start Date End Date Taking? Authorizing Provider  acetaminophen (TYLENOL) 325 MG tablet Take 650 mg by mouth every  6 (six) hours as needed.   Yes [provider]  albuterol (PROVENTIL) (2.5 MG/3ML) 0.083% nebulizer solution Take 3 mLs (2.5 mg total) by nebulization every 2 (two) hours as needed for wheezing or shortness of breath. 08/28/18  Yes [address] [address], Modena Nunnery, MD  allopurinol (ZYLOPRIM) 100 MG tablet TAKE 1 TABLET(100 MG) BY MOUTH AT BEDTIME Patient taking differently: Take 100 mg by mouth every evening.  06/09/18  Yes [place], Modena Nunnery, MD  amLODipine (NORVASC) 10 MG tablet TAKE 1 TABLET BY MOUTH DAILY Patient taking differently: Take 10 mg by mouth every morning.  09/01/18  Yes [place],  Modena Nunnery, MD  ATROVENT HFA 17 MCG/ACT inhaler INHALE 2 PUFFS BY MOUTH INTO THE LUNGS EVERY 6 HOURS Patient taking differently: Inhale 2 puffs into the lungs every 6 (six) hours as needed for wheezing.  11/08/16  Yes [place], Modena Nunnery, MD  calcium carbonate (TUMS - DOSED IN MG ELEMENTAL CALCIUM) 500 MG chewable tablet Chew 1,000 mg by mouth at bedtime. Reported on 11/21/2015   Yes [provider]  cholecalciferol (VITAMIN D3) 25 MCG (1000 UT) tablet Take 1,000 Units by mouth every morning.   Yes [provider]  cimetidine (TAGAMET) 200 MG tablet Take 200 mg by mouth at bedtime.   Yes [provider]  citalopram (CELEXA) 10 MG tablet TAKE 1 TABLET(10 MG) BY MOUTH DAILY Patient taking differently: Take 10 mg by mouth every morning.  10/31/18  Yes [place], Modena Nunnery, MD  clobetasol cream (TEMOVATE) 0.05 % Apply topically 2 (two) times daily. 11/30/18  Yes [place], Modena Nunnery, MD  clopidogrel (PLAVIX) 75 MG tablet TAKE 1 TABLET(75 MG) BY MOUTH DAILY Patient taking differently: Take 75 mg by mouth every morning.  10/09/18  Yes [place], Modena Nunnery, MD  diphenhydrAMINE (BENADRYL) 25 MG tablet Take 25 mg by mouth daily as needed for allergies.    Yes [provider]  folic acid (FOLVITE) 1 MG tablet Take 1 tablet (1 mg total) by mouth daily. 05/22/18  Yes Derek Jack, MD  Glucosamine-Chondroit-Vit C-Mn (GLUCOSAMINE 1500 COMPLEX) CAPS Take 1,200 capsules by mouth 2 (two) times daily.   Yes [provider]  HYDROcodone-acetaminophen (NORCO) 10-325 MG tablet Take 1-2 tablets by mouth every 8 (eight) hours as needed. Patient taking differently: Take 1 tablet by mouth every 8 (eight) hours as needed for moderate pain or severe pain.  11/08/18  Yes [place], Modena Nunnery, MD  levothyroxine (SYNTHROID, LEVOTHROID) 50 MCG tablet TAKE 1 TABLET(50 MCG) BY MOUTH DAILY BEFORE BREAKFAST Patient taking differently: Take 50 mcg by mouth daily before breakfast.  10/31/18  Yes  [place], Modena Nunnery, MD  metoprolol tartrate (LOPRESSOR) 50 MG tablet TAKE 1 TABLET(50 MG) BY MOUTH TWICE DAILY Patient taking differently: Take 50 mg by mouth 2 (two) times daily.  11/30/18  Yes [place], Modena Nunnery, MD  mometasone (ASMANEX 60 METERED DOSES) 220 MCG/INH inhaler Inhale 2 puffs into the lungs 2 (two) times daily. 08/08/15  Yes [place], Modena Nunnery, MD  Multiple Vitamin (MULTIVITAMIN WITH MINERALS) TABS tablet Take 1 tablet by mouth daily.   Yes [provider]  neomycin-bacitracin-polymyxin (NEOSPORIN) ointment Apply 1 application topically as needed for wound care.   Yes [provider]  Omega-3 Fatty Acids (FISH OIL) 1000 MG CPDR Take 1 capsule by mouth 2 (two) times daily.    Yes [provider]  predniSONE (DELTASONE) 20 MG tablet Take 3 tablets (60 mg total) by mouth daily with breakfast. Patient taking differently: Take 10-20 mg by  mouth See admin instructions. Alternate taking 24m with 244mdaily as directed ([id number]) 06/27/18  Yes KatraDerek Jack sulfamethoxazole-trimethoprim (BACTRIM DS,SEPTRA DS) 800-160 MG tablet TAKE 1 TABLET BY MOUTH 3 TIMES A WEEK Patient taking differently: Take 1 tablet by mouth every Monday, Wednesday, and Friday.  10/09/18  Yes [place], Randi L, NP-C  vitamin B-12 (CYANOCOBALAMIN) 1000 MCG tablet Take 1,000 mcg by mouth every morning.    Yes [provider]  vitamin C (ASCORBIC ACID) 500 MG tablet Take 500 mg by mouth 2 (two) times daily.   Yes [provider]  vitamin E 400 UNIT capsule Take 400 Units by mouth every morning.    Yes [provider]    Family History Family History  Problem Relation Age of Onset  . Cancer Mother        lung, met to brain  . Diabetes Mellitus II Sister   . Colon cancer Neg Hx     Social History Social History   Tobacco Use  . Smoking status: Former Smoker    Last attempt to quit: 12/06/1996    Years since quitting: 22.0  . Smokeless  tobacco: Never Used  Substance Use Topics  . Alcohol use: Yes    Comment: a few shots a few times a month  . Drug use: No     Allergies   Contrast media [iodinated diagnostic agents]; Other; and Ether   Review of Systems Review of Systems  All other systems reviewed and are negative.    Physical Exam Updated Vital Signs BP 131/63 (BP Location: Left Arm)   Pulse 93   Temp 98.3 F (36.8 C) (Oral)   Resp 18   Ht 1.702 m (5' 7" )   Wt 122 kg   SpO2 96%   BMI 42.13 kg/m   Physical Exam Vitals signs and nursing note reviewed.  HENT:     Head: Normocephalic and atraumatic.  Musculoskeletal:       Hands:     Comments: Some erythema and swelling on the radial dorsal aspect of left thumb  Neurological:     Mental Status: He is alert.      ED Treatments / Results  Labs (all labs ordered are listed, but only abnormal results are displayed) Labs Reviewed  CBC WITH DIFFERENTIAL/PLATELET  COMPREHENSIVE METABOLIC PANEL    EKG None  Radiology No results found.  Procedures ..InciMarland Kitchenion and Drainage Date/Time: 12/05/2018 6:55 PM Performed by: Ayaan Ringle, Pattricia BossAuthorized by: Malya Cirillo, Pattricia Boss  Consent:    Consent obtained:  Verbal   Consent given by:  Patient   Risks discussed:  Bleeding, incomplete drainage and infection   Alternatives discussed:  No treatment Location:    Indications for incision and drainage: Paronychia.   Location:  Upper extremity   Upper extremity location:  Finger   Finger location:  L thumb Pre-procedure details:    Skin preparation:  Betadine Anesthesia (see MAR for exact dosages):    Anesthesia method:  Nerve block   Block needle gauge:  27 G   Block anesthetic:  Lidocaine 1% w/o epi   Block technique:  Digital   Block injection procedure:  Anatomic landmarks identified, introduced needle, incremental injection and anatomic landmarks palpated Procedure type:    Complexity:  Simple Procedure details:    Needle aspiration: no       Incision types:  Stab incision and single straight   Incision depth:  Dermal   Scalpel blade:  11  Drainage:  Bloody   Wound treatment:  Wound left open   Packing materials:  None Post-procedure details:    Patient tolerance of procedure:  Tolerated well, no immediate complications   (including critical care time)  Medications Ordered in ED Medications  lidocaine (XYLOCAINE) 2 % injection (has no administration in time range)     Initial Impression / Assessment and Plan / ED Course  I have reviewed the triage vital signs and the nursing notes.  Pertinent labs & imaging results that were available during my care of the patient were reviewed by me and considered in my medical decision making (see chart for details).       Final Clinical Impressions(s) / ED Diagnoses   Final diagnoses:  Paronychia of left thumb  CLL (chronic lymphocytic leukemia) [facility])    ED Discharge Orders    None       Pattricia Boss, MD 12/05/18 [id number]

## 2018-12-05 NOTE — Telephone Encounter (Signed)
Received call from patient wife Zigmund Daniel.   Reports that patient picked at hangnail on L thumb and has torn cuticle. Redness noted in a streak up side of thumb, but redness has now has spread to first joint. Isolated area of infection noted beside cuticle where tear in skin occurred.   Patient is currently on Bactrim DS 3x weekly due to prednisone use. States that he will continue Bactrim as prophylactic until prednisone is completed, but he will remain on prednisone for at least another month.   Patient is also using epsom salt soaks to help draw infection out of area.   MD made aware and states that area will need to be drained, and due to leukemia, it needs to be done in sterile setting. Also will likely need increased dosage of ABTx.   Patient wife made aware. Verbalized understanding. Reports that she will take patient to ER.

## 2018-12-05 NOTE — ED Triage Notes (Signed)
Pt reports noticing yesterday that left thumb was getting red and painful. Pt called PCP and was told to monitor, then PCP called back today to check on status and told pt to come to ED to have it checked and possibly lanced. Pt is currently taking Bactrim and prednisone since June of 2019. Pt is a CLL patient, currently not on chemo CLL is controlled right now, currently stage I per pt spouse.

## 2018-12-07 ENCOUNTER — Other Ambulatory Visit: Payer: Self-pay | Admitting: Family Medicine

## 2018-12-07 ENCOUNTER — Encounter (HOSPITAL_COMMUNITY): Payer: Self-pay | Admitting: Hematology

## 2018-12-07 ENCOUNTER — Other Ambulatory Visit (HOSPITAL_COMMUNITY): Payer: Medicare Other

## 2018-12-07 ENCOUNTER — Inpatient Hospital Stay (HOSPITAL_COMMUNITY): Payer: Medicare Other | Attending: Hematology | Admitting: Hematology

## 2018-12-07 ENCOUNTER — Other Ambulatory Visit: Payer: Self-pay

## 2018-12-07 DIAGNOSIS — R51 Headache: Secondary | ICD-10-CM | POA: Diagnosis not present

## 2018-12-07 DIAGNOSIS — D801 Nonfamilial hypogammaglobulinemia: Secondary | ICD-10-CM | POA: Insufficient documentation

## 2018-12-07 DIAGNOSIS — R197 Diarrhea, unspecified: Secondary | ICD-10-CM | POA: Insufficient documentation

## 2018-12-07 DIAGNOSIS — N183 Chronic kidney disease, stage 3 (moderate): Secondary | ICD-10-CM

## 2018-12-07 DIAGNOSIS — D591 Other autoimmune hemolytic anemias: Secondary | ICD-10-CM | POA: Diagnosis not present

## 2018-12-07 DIAGNOSIS — C9110 Chronic lymphocytic leukemia of B-cell type not having achieved remission: Secondary | ICD-10-CM | POA: Diagnosis not present

## 2018-12-07 MED ORDER — SULFAMETHOXAZOLE-TRIMETHOPRIM 800-160 MG PO TABS: 1.0000 | ORAL_TABLET | ORAL | 3 refills | Status: AC

## 2018-12-07 NOTE — Patient Instructions (Signed)
[facility] at [facility] Discharge Instructions  Follow up in 1 months with labs    Thank you for choosing [facility] at [facility] to provide your oncology and hematology care.  To afford each patient quality time with our provider, please arrive at least 15 minutes before your scheduled appointment time.   If you have a lab appointment with the Cancer Center please come in thru the  Main Entrance and check in at the main information desk  You need to re-schedule your appointment should you arrive 10 or more minutes late.  We strive to give you quality time with our providers, and arriving late affects you and other patients whose appointments are after yours.  Also, if you no show three or more times for appointments you may be dismissed from the clinic at the providers discretion.     Again, thank you for choosing [facility].  Our hope is that these requests will decrease the amount of time that you wait before being seen by our physicians.       _____________________________________________________________  Should you have questions after your visit to [facility], please contact our office at [phone] between the hours of 8:00 a.m. and 4:30 p.m.  Voicemails left after 4:00 p.m. will not be returned until the following business day.  For prescription refill requests, have your pharmacy contact our office and allow 72 hours.    Cancer Center Support Programs:   > Cancer Support Group  2nd Tuesday of the month 1pm-2pm, Journey Room    

## 2018-12-07 NOTE — Progress Notes (Signed)
[place], [place] [address]   CLINIC:  Medical Oncology/Hematology  PCP:  Alycia Rossetti, MD [address] [place] [address] [phone]   REASON FOR VISIT: Follow-up for CLL AND autoimmune hemolytic anemia  CURRENT THERAPY: prednisone37m everyday starting 10/21/18, procrit, and bactrim every other day   INTERVAL HISTORY:  Mr. [id number]. male returns for routine follow-up for CLL and autoimmune hemolytic anemia. He is having diarrhea occasionally. He is having headaches daily over the past week. He has also had some sinus problems lately. He is continuing to gain weight due to the prednisone. Denies any nausea or vomiting. Denies any new pains. Had not noticed any recent bleeding such as epistaxis, hematuria or hematochezia. Denies recent chest pain on exertion, shortness of breath on minimal exertion, pre-syncopal episodes, or palpitations. Denies any numbness or tingling in hands or feet. Denies any recent fevers, infections, or recent hospitalizations. He reports his appetite and energy level at 100%.     REVIEW OF SYSTEMS:  Review of Systems  Gastrointestinal: Positive for diarrhea.  All other systems reviewed and are negative.    PAST MEDICAL/SURGICAL HISTORY:  Past Medical History:  Diagnosis Date  . Allergy   . Arthritis   . Asthma   . Cancer ([facility]    kidney  . CLL (chronic lymphocytic leukemia) ([facility] 2016  . COPD (chronic obstructive pulmonary disease) ([facility]   . Diverticulitis   . Diverticulosis    colonoscopy 3/15  . GERD (gastroesophageal reflux disease)   . Hypertension   . Low testosterone   . Migraine   . Shingles 03/31/2016  . Solar purpura ([facility]   . Thyroid disease    hypothryoid  . Ulcer   . Ventral hernia FEB 2015 CT    Large infra umbilical ventral hernia containing loops of small   Past Surgical History:  Procedure Laterality Date  . abdominal mesh inserted  june 2006, may  2008,dec 2010  . CHOLECYSTECTOMY  june 2007  . COLON SURGERY  sept 2007  . COLONOSCOPY    . COLONOSCOPY N/A 02/20/2014   Procedure: COLONOSCOPY;  Surgeon: SDanie Binder MD;  Location: [facility];  Service: Endoscopy;  Laterality: N/A;  . HERNIA REPAIR  05/2005 5/20108, 11/2009   with mesh  . KIDNEY CYST REMOVAL  jan 2005   renal cell carcinoma  . knee minescus repair Left feb 2007  . NEPHRECTOMY Left 12/2003     SOCIAL HISTORY:  Social History   Socioeconomic History  . Marital status: Married    Spouse name: KZigmund Daniel . Number of children: 4  . Years of education: 161 . 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 Financial resource strain: Not on file  . Food insecurity:    Worry: Not on file    Inability: Not on file  . Transportation needs:    Medical: Not on file    Non-medical: Not on file  Tobacco Use  . Smoking status: Former Smoker    Last attempt to quit: 12/06/1996    Years since quitting: 22.0  . Smokeless tobacco: Never Used  Substance and Sexual Activity  . Alcohol use: Yes    Comment: a few shots a few times a month  . Drug use: No  . Sexual activity: Not on file  Lifestyle  . Physical activity:    Days per week:  Not on file    Minutes per session: Not on file  . Stress: Not on file  Relationships  . Social connections:    Talks on phone: Not on file    Gets together: Not on file    Attends religious service: Not on file    Active member of club or organization: Not on file    Attends meetings of clubs or organizations: Not on file    Relationship status: Not on file  . Intimate partner violence:    Fear of current or ex partner: Not on file    Emotionally abused: Not on file    Physically abused: Not on file    Forced sexual activity: Not on file  Other Topics Concern  . 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   Lives at home with  wife, daughter, family   Caffeine- sodas, 2 daily    FAMILY HISTORY:  Family History  Problem Relation Age of Onset  . Cancer Mother        lung, met to brain  . Diabetes Mellitus II Sister   . Colon cancer Neg Hx     CURRENT MEDICATIONS:  Outpatient Encounter Medications as of 12/07/2018  Medication Sig  . acetaminophen (TYLENOL) 325 MG tablet Take 650 mg by mouth every 6 (six) hours as needed.  Marland Kitchen albuterol (PROVENTIL) (2.5 MG/3ML) 0.083% nebulizer solution Take 3 mLs (2.5 mg total) by nebulization every 2 (two) hours as needed for wheezing or shortness of breath.  . allopurinol (ZYLOPRIM) 100 MG tablet TAKE 1 TABLET(100 MG) BY MOUTH AT BEDTIME (Patient taking differently: Take 100 mg by mouth every evening. )  . amLODipine (NORVASC) 10 MG tablet TAKE 1 TABLET BY MOUTH DAILY  . ATROVENT HFA 17 MCG/ACT inhaler INHALE 2 PUFFS BY MOUTH INTO THE LUNGS EVERY 6 HOURS (Patient taking differently: Inhale 2 puffs into the lungs every 6 (six) hours as needed for wheezing. )  . calcium carbonate (TUMS - DOSED IN MG ELEMENTAL CALCIUM) 500 MG chewable tablet Chew 1,000 mg by mouth at bedtime. Reported on 11/21/2015  . cephALEXin (KEFLEX) 500 MG capsule Take 1 capsule (500 mg total) by mouth 2 (two) times daily.  . cholecalciferol (VITAMIN D3) 25 MCG (1000 UT) tablet Take 1,000 Units by mouth every morning.  . cimetidine (TAGAMET) 200 MG tablet Take 200 mg by mouth at bedtime.  . citalopram (CELEXA) 10 MG tablet TAKE 1 TABLET(10 MG) BY MOUTH DAILY (Patient taking differently: Take 10 mg by mouth every morning. )  . clobetasol cream (TEMOVATE) 0.05 % Apply topically 2 (two) times daily.  . clopidogrel (PLAVIX) 75 MG tablet TAKE 1 TABLET(75 MG) BY MOUTH DAILY (Patient taking differently: Take 75 mg by mouth every morning. )  . diphenhydrAMINE (BENADRYL) 25 MG tablet Take 25 mg by mouth daily as needed for allergies.   . folic acid (FOLVITE) 1 MG tablet Take 1 tablet (1 mg total) by mouth daily.  .  Glucosamine-Chondroit-Vit C-Mn (GLUCOSAMINE 1500 COMPLEX) CAPS Take 1,200 capsules by mouth 2 (two) times daily.  Marland Kitchen HYDROcodone-acetaminophen (NORCO) 10-325 MG tablet Take 1-2 tablets by mouth every 8 (eight) hours as needed. (Patient taking differently: Take 1 tablet by mouth every 8 (eight) hours as needed for moderate pain or severe pain. )  . levothyroxine (SYNTHROID, LEVOTHROID) 50 MCG tablet TAKE 1 TABLET(50 MCG) BY MOUTH DAILY BEFORE BREAKFAST (Patient taking differently: Take 50 mcg by  mouth daily before breakfast. )  . metoprolol tartrate (LOPRESSOR) 50 MG tablet TAKE 1 TABLET(50 MG) BY MOUTH TWICE DAILY (Patient taking differently: Take 50 mg by mouth 2 (two) times daily. )  . mometasone (ASMANEX 60 METERED DOSES) 220 MCG/INH inhaler Inhale 2 puffs into the lungs 2 (two) times daily.  . Multiple Vitamin (MULTIVITAMIN WITH MINERALS) TABS tablet Take 1 tablet by mouth daily.  Marland Kitchen neomycin-bacitracin-polymyxin (NEOSPORIN) ointment Apply 1 application topically as needed for wound care.  . Omega-3 Fatty Acids (FISH OIL) 1000 MG CPDR Take 1 capsule by mouth 2 (two) times daily.   . predniSONE (DELTASONE) 20 MG tablet Take 3 tablets (60 mg total) by mouth daily with breakfast. (Patient taking differently: Take 10-20 mg by mouth See admin instructions. Alternate taking 63m with 250mdaily as directed ([id number]))  . [START ON 12/08/2018] sulfamethoxazole-trimethoprim (BACTRIM DS,SEPTRA DS) 800-160 MG tablet Take 1 tablet by mouth every Monday, Wednesday, and Friday.  . vitamin B-12 (CYANOCOBALAMIN) 1000 MCG tablet Take 1,000 mcg by mouth every morning.   . vitamin C (ASCORBIC ACID) 500 MG tablet Take 500 mg by mouth 2 (two) times daily.  . vitamin E 400 UNIT capsule Take 400 Units by mouth every morning.   . [DISCONTINUED] sulfamethoxazole-trimethoprim (BACTRIM DS,SEPTRA DS) 800-160 MG tablet TAKE 1 TABLET BY MOUTH 3 TIMES A WEEK (Patient taking differently: Take 1 tablet by mouth every  Monday, Wednesday, and Friday. )  . [DISCONTINUED] amLODipine (NORVASC) 10 MG tablet TAKE 1 TABLET BY MOUTH DAILY (Patient taking differently: Take 10 mg by mouth every morning. )   No facility-administered encounter medications on file as of 12/07/2018.     ALLERGIES:  Allergies  Allergen Reactions  . Contrast Media [Iodinated Diagnostic Agents]     Single functioning kidney  . Other Hives    Neoprene fabric Strawberry   . Ether Rash    Took a long time to wake up Doesn't wake up from it     PHYSICAL EXAM:  ECOG Performance status: 1  Vitals:   12/07/18 1100  BP: 105/69  Pulse: 70  Resp: 16  Temp: (!) 97.5 F (36.4 C)  SpO2: 95%   Filed Weights   12/07/18 1100  Weight: 270 lb (122.5 kg)    Physical Exam Constitutional:      Appearance: Normal appearance. He is normal weight.  Cardiovascular:     Rate and Rhythm: Normal rate and regular rhythm.     Heart sounds: Normal heart sounds.  Pulmonary:     Effort: Pulmonary effort is normal.     Breath sounds: Normal breath sounds.  Musculoskeletal: Normal range of motion.  Skin:    General: Skin is warm and dry.  Neurological:     Mental Status: He is alert and oriented to person, place, and time. Mental status is at baseline.  Psychiatric:        Mood and Affect: Mood normal.        Behavior: Behavior normal.        Thought Content: Thought content normal.        Judgment: Judgment normal.      LABORATORY DATA:  I have reviewed the labs as listed.  CBC    Component Value Date/Time   WBC 44.0 (H) 12/05/2018 1819   RBC 4.12 (L) 12/05/2018 1819   HGB 12.2 (L) 12/05/2018 1819   HCT 40.1 12/05/2018 1819   PLT 153 12/05/2018 1819   MCV 97.3 12/05/2018 1819  MCH 29.6 12/05/2018 1819   MCHC 30.4 12/05/2018 1819   RDW 14.2 12/05/2018 1819   LYMPHSABS 34.8 (H) 12/05/2018 1819   MONOABS 4.1 (H) 12/05/2018 1819   EOSABS 0.0 12/05/2018 1819   BASOSABS 0.1 12/05/2018 1819   CMP Latest Ref Rng & Units  12/05/2018 11/08/2018 10/09/2018  Glucose 70 - 99 mg/dL 140(H) 117(H) 138(H)  BUN 8 - 23 mg/dL 30(H) 21 29(H)  Creatinine 0.61 - 1.24 mg/dL 1.75(H) 1.69(H) 1.57(H)  Sodium 135 - 145 mmol/L 140 139 137  Potassium 3.5 - 5.1 mmol/L 5.0 4.7 4.3  Chloride 98 - 111 mmol/L 111 107 107  CO2 22 - 32 mmol/L _0 Calcium 8.9 - 10.3 mg/dL 9.1 9.0 9.2  Total Protein 6.5 - 8.1 g/dL 5.9(L) 6.1(L) 6.2(L)  Total Bilirubin 0.3 - 1.2 mg/dL 0.4 0.4 0.5  Alkaline Phos 38 - 126 U/L 29(L) 29(L) 32(L)  AST 15 - 41 U/L _1 ALT 0 - 44 U/L _2 DIAGNOSTIC IMAGING:  I have independently reviewed the scans and discussed with the patient.   I have reviewed Francene Finders, NP's note and agree with the documentation.  I personally performed a face-to-face visit, made revisions and my assessment and plan is as follows.    ASSESSMENT & PLAN:   CLL (chronic lymphocytic leukemia) ([place]) 1.  Autoimmune hemolytic anemia:  -He developed autoimmune hemolytic anemia as a complication of CLL.  Direct Coombs test was positive for IgG and Cd3.  We have started him on prednisone 1 mg/kg rounded of to 100 mg daily on 05/19/2018.  He is tolerating it very well. 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  His LDH has normalized.  We have cut back on his Procrit to 30,000 units on 06/07/2018.  We have also tapered his prednisone to 80 mg daily on 06/14/2018. - His prednisone dose was reduced to 60 mg on 06/21/2018.  He went on a trip to [place] for last 4 weeks.  He did not feel any severe fatigue. -Prednisone was dose reduced to 40 mg daily on 08/02/2018. -Prednisone dose reduced to 30 mg daily on 09/13/2018.   - Prednisone decreased to 20 mg daily on 10/22/2018.  - I have cut back on prednisone to 20 mg alternating with 10 mg on 11/08/2018.  His only complaint was weight gain. - He developed infection of his nail of the right thumb.  He went to the ER on  12/05/2018.  He was placed on Keflex.  Wound is healing well. - We reviewed blood work from the ER visit which showed hemoglobin of 12.2.  White count was 44. -I have recommended decreasing prednisone to 10 mg daily.  We will reevaluate him in 4 weeks.  2.  Hypogammaglobulinemia: Despite his IgG levels in the 400 range, he does not have any recurrent infections requiring antibiotics.  He was treated with IVIG in February 2017, which was discontinued secondary to high cost.  3.  Stage III CKD:  -He is status post left nephrectomy for renal cell carcinoma in 2005.   -His elevated creatinine is more or less stable.  4.  ID prophylaxis: - He will continue Bactrim DS 1 tablet 3 times a week as long as he is on steroids.      Orders placed this encounter:  Orders Placed This Encounter  Procedures  . Reticulocytes  .  Lactate dehydrogenase  . CBC with Differential/Platelet  . Comprehensive metabolic panel      Derek Jack, MD [place] [phone]

## 2018-12-07 NOTE — Telephone Encounter (Signed)
Ok to refill??  Last office visit 04/07/2018.  Last refill 11/08/2018.

## 2018-12-07 NOTE — Assessment & Plan Note (Addendum)
1.  Autoimmune hemolytic anemia:  -He developed autoimmune hemolytic anemia as a complication of CLL.  Direct Coombs test was positive for IgG and Cd3.  We have started him on prednisone 1 mg/kg rounded of to 100 mg daily on 05/19/2018.  He is tolerating it very well. 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  His LDH has normalized.  We have cut back on his Procrit to 30,000 units on 06/07/2018.  We have also tapered his prednisone to 80 mg daily on 06/14/2018. - His prednisone dose was reduced to 60 mg on 06/21/2018.  He went on a trip to [place] for last 4 weeks.  He did not feel any severe fatigue. -Prednisone was dose reduced to 40 mg daily on 08/02/2018. -Prednisone dose reduced to 30 mg daily on 09/13/2018.   - Prednisone decreased to 20 mg daily on 10/22/2018.  - I have cut back on prednisone to 20 mg alternating with 10 mg on 11/08/2018.  His only complaint was weight gain. - He developed infection of his nail of the right thumb.  He went to the ER on 12/05/2018.  He was placed on Keflex.  Wound is healing well. - We reviewed blood work from the ER visit which showed hemoglobin of 12.2.  White count was 44. -I have recommended decreasing prednisone to 10 mg daily.  We will reevaluate him in 4 weeks.  2.  Hypogammaglobulinemia: Despite his IgG levels in the 400 range, he does not have any recurrent infections requiring antibiotics.  He was treated with IVIG in February 2017, which was discontinued secondary to high cost.  3.  Stage III CKD:  -He is status post left nephrectomy for renal cell carcinoma in 2005.   -His elevated creatinine is more or less stable.  4.  ID prophylaxis: - He will continue Bactrim DS 1 tablet 3 times a week as long as he is on steroids.

## 2018-12-08 MED ORDER — HYDROCODONE-ACETAMINOPHEN 10-325 MG PO TABS: 1.0000 | ORAL_TABLET | Freq: Three times a day (TID) | ORAL | 0 refills | Status: DC | PRN

## 2018-12-30 ENCOUNTER — Encounter: Payer: Self-pay | Admitting: Family Medicine

## 2019-01-01 NOTE — Telephone Encounter (Signed)
Jury Summons received and printed for MD to review.

## 2019-01-03 ENCOUNTER — Inpatient Hospital Stay (HOSPITAL_BASED_OUTPATIENT_CLINIC_OR_DEPARTMENT_OTHER): Payer: Medicare Other | Admitting: Hematology

## 2019-01-03 ENCOUNTER — Encounter: Payer: Self-pay | Admitting: Family Medicine

## 2019-01-03 ENCOUNTER — Other Ambulatory Visit: Payer: Self-pay

## 2019-01-03 ENCOUNTER — Encounter (HOSPITAL_COMMUNITY): Payer: Self-pay | Admitting: Hematology

## 2019-01-03 ENCOUNTER — Inpatient Hospital Stay (HOSPITAL_COMMUNITY): Payer: Medicare Other

## 2019-01-03 VITALS — BP 108/58 | HR 67 | Temp 97.8°F | Resp 16 | Wt 269.0 lb

## 2019-01-03 DIAGNOSIS — D801 Nonfamilial hypogammaglobulinemia: Secondary | ICD-10-CM

## 2019-01-03 DIAGNOSIS — R51 Headache: Secondary | ICD-10-CM | POA: Diagnosis not present

## 2019-01-03 DIAGNOSIS — C9110 Chronic lymphocytic leukemia of B-cell type not having achieved remission: Secondary | ICD-10-CM

## 2019-01-03 DIAGNOSIS — D591 Other autoimmune hemolytic anemias: Secondary | ICD-10-CM

## 2019-01-03 DIAGNOSIS — R197 Diarrhea, unspecified: Secondary | ICD-10-CM | POA: Diagnosis not present

## 2019-01-03 DIAGNOSIS — N183 Chronic kidney disease, stage 3 (moderate): Secondary | ICD-10-CM

## 2019-01-03 LAB — COMPREHENSIVE METABOLIC PANEL
ALT: 19 U/L (ref 0–44)
ANION GAP: 8 (ref 5–15)
AST: 17 U/L (ref 15–41)
Albumin: 4.2 g/dL (ref 3.5–5.0)
Alkaline Phosphatase: 30 U/L — ABNORMAL LOW (ref 38–126)
BUN: 25 mg/dL — ABNORMAL HIGH (ref 8–23)
CO2: 25 mmol/L (ref 22–32)
Calcium: 9.5 mg/dL (ref 8.9–10.3)
Chloride: 106 mmol/L (ref 98–111)
Creatinine, Ser: 1.89 mg/dL — ABNORMAL HIGH (ref 0.61–1.24)
GFR calc Af Amer: 41 mL/min — ABNORMAL LOW (ref >60)
GFR calc non Af Amer: 36 mL/min — ABNORMAL LOW (ref >60)
Glucose, Bld: 139 mg/dL — ABNORMAL HIGH (ref 70–99)
Potassium: 5 mmol/L (ref 3.5–5.1)
Sodium: 139 mmol/L (ref 135–145)
TOTAL PROTEIN: 6.2 g/dL — AB (ref 6.5–8.1)
Total Bilirubin: 0.3 mg/dL (ref 0.3–1.2)

## 2019-01-03 LAB — CBC WITH DIFFERENTIAL/PLATELET
Abs Immature Granulocytes: 0.1 10*3/uL — ABNORMAL HIGH (ref 0.00–0.07)
Basophils Absolute: 0.1 10*3/uL (ref 0.0–0.1)
Basophils Relative: 0 %
Eosinophils Absolute: 0.2 10*3/uL (ref 0.0–0.5)
Eosinophils Relative: 0 %
HCT: 42.3 % (ref 39.0–52.0)
Hemoglobin: 12.4 g/dL — ABNORMAL LOW (ref 13.0–17.0)
Immature Granulocytes: 0 %
Lymphocytes Relative: 82 %
Lymphs Abs: 45.2 10*3/uL — ABNORMAL HIGH (ref 0.7–4.0)
MCH: 29.2 pg (ref 26.0–34.0)
MCHC: 29.3 g/dL — ABNORMAL LOW (ref 30.0–36.0)
MCV: 99.5 fL (ref 80.0–100.0)
MONO ABS: 6.7 10*3/uL — AB (ref 0.1–1.0)
Monocytes Relative: 12 %
Neutro Abs: 3.3 10*3/uL (ref 1.7–7.7)
Neutrophils Relative %: 6 %
Platelets: 168 10*3/uL (ref 150–400)
RBC: 4.25 MIL/uL (ref 4.22–5.81)
RDW: 14.1 % (ref 11.5–15.5)
WBC: 55.6 10*3/uL (ref 4.0–10.5)
nRBC: 0 % (ref 0.0–0.2)

## 2019-01-03 LAB — RETICULOCYTES
Immature Retic Fract: 22.4 % — ABNORMAL HIGH (ref 2.3–15.9)
RBC.: 4.25 MIL/uL (ref 4.22–5.81)
Retic Count, Absolute: 52 10*3/uL (ref 19.0–186.0)
Retic Ct Pct: 1.2 % (ref 0.4–3.1)

## 2019-01-03 LAB — LACTATE DEHYDROGENASE: LDH: 149 U/L (ref 98–192)

## 2019-01-03 NOTE — Assessment & Plan Note (Signed)
1.  Autoimmune hemolytic anemia:  -He developed autoimmune hemolytic anemia as a complication of CLL.  Direct Coombs test was positive for IgG and Cd3.  We have started him on prednisone 1 mg/kg rounded of to 100 mg daily on 05/19/2018.  He is tolerating it very well. 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  His LDH has normalized.  We have cut back on his Procrit to 30,000 units on 06/07/2018.  We have also tapered his prednisone to 80 mg daily on 06/14/2018. - His prednisone dose was reduced to 60 mg on 06/21/2018.  He went on a trip to [place] for last 4 weeks.  He did not feel any severe fatigue. -Prednisone was dose reduced to 40 mg daily on 08/02/2018. -Prednisone dose reduced to 30 mg daily on 09/13/2018.   - Prednisone decreased to 20 mg daily on 10/22/2018.  - I have cut back on prednisone to 20 mg alternating with 10 mg on 11/08/2018.  His only complaint was weight gain. - Prednisone was further decreased to 10 mg daily on 12/07/2018. -He did not have any shortness of breath or severe tiredness.  Denies any fevers or infections. -We reviewed blood work today.  Hemoglobin is 12.4.  Reticulocyte count and LDH were normal.  I have recommended further cutting the dose of prednisone to 5 mg daily.  We will reevaluate him in 4 weeks.  We will also check direct Coombs test.  2.  Hypogammaglobulinemia: Despite his IgG levels in the 400 range, he does not have any recurrent infections requiring antibiotics.  He was treated with IVIG in February 2017, which was discontinued secondary to high cost.  3.  Stage III CKD: -Status post left nephrectomy for RCC in 2005. -His creatinine is slightly elevated at 1.8 today.  4.  ID prophylaxis: - He will continue Bactrim DS 1 tablet 3 times a week as long as he is on steroids.

## 2019-01-03 NOTE — Patient Instructions (Addendum)
[facility] at [facility] Discharge Instructions  Follow up in 4 weeks with labs    Thank you for choosing [facility] at [facility] to provide your oncology and hematology care.  To afford each patient quality time with our provider, please arrive at least 15 minutes before your scheduled appointment time.   If you have a lab appointment with the Cancer Center please come in thru the  Main Entrance and check in at the main information desk  You need to re-schedule your appointment should you arrive 10 or more minutes late.  We strive to give you quality time with our providers, and arriving late affects you and other patients whose appointments are after yours.  Also, if you no show three or more times for appointments you may be dismissed from the clinic at the providers discretion.     Again, thank you for choosing [facility].  Our hope is that these requests will decrease the amount of time that you wait before being seen by our physicians.       _____________________________________________________________  Should you have questions after your visit to [facility], please contact our office at [phone] between the hours of 8:00 a.m. and 4:30 p.m.  Voicemails left after 4:00 p.m. will not be returned until the following business day.  For prescription refill requests, have your pharmacy contact our office and allow 72 hours.    Cancer Center Support Programs:   > Cancer Support Group  2nd Tuesday of the month 1pm-2pm, Journey Room    

## 2019-01-03 NOTE — Progress Notes (Signed)
[place], [place] [address]   CLINIC:  Medical Oncology/Hematology  PCP:  Alycia Rossetti, MD [address] [place] [address] [phone]   REASON FOR VISIT: Follow-up for CLL AND autoimmune hemolytic anemia  CURRENT THERAPY:Starting prednisone59meverydaystarting 01/03/19, and bactrim every other day   INTERVAL HISTORY:  [name] Ball returns for routine follow-up CLL AND autoimmune hemolytic anemia. He is here with his wife and doing well. He was excited he lost 1 pound. Denies any nausea, vomiting, or diarrhea. Denies any new pains. Had not noticed any recent bleeding such as epistaxis, hematuria or hematochezia. Denies recent chest pain on exertion, shortness of breath on minimal exertion, pre-syncopal episodes, or palpitations. Denies any numbness or tingling in hands or feet. Denies any recent fevers, infections, or recent hospitalizations. Patient reports appetite at 100% and energy level at 25%.   REVIEW OF SYSTEMS:  Review of Systems  All other systems reviewed and are negative.    PAST MEDICAL/SURGICAL HISTORY:  Past Medical History:  Diagnosis Date  . Allergy   . Arthritis   . Asthma   . Cancer ([facility]    kidney  . CLL ([place]ronic lymphocytic leukemia) ([facility] 2016  . COPD (ch[place] obstructive pulmonary disease) ([facility]   . Diverticulitis   . Diverticulosis    colonoscopy 3/15  . GERD (gastroesophageal reflux disease)   . Hypertension   . Low testosterone   . Migraine   . Shingles 03/31/2016  . Solar purpura ([facility]   . Thyroid disease    hypothryoid  . Ulcer   . Ventral hernia FEB 2015 CT    Large infra umbilical ventral hernia containing loops of small   Past Surgical History:  Procedure Laterality Date  . abdominal mesh inserted  june 2006, may 2008,dec 2010  . CHOLECYSTECTOMY  june 2007  . COLON SURGERY  sept 2007  . COLONOSCOPY    . COLONOSCOPY N/A 02/20/2014   Procedure: COLONOSCOPY;   Surgeon: SDanie Binder MD;  Location: [facility];  Service: Endoscopy;  Laterality: N/A;  . HERNIA REPAIR  05/2005 5/20108, 11/2009   with mesh  . KIDNEY CYST REMOVAL  jan 2005   renal cell carcinoma  . knee minescus repair Left feb 2007  . NEPHRECTOMY Left 12/2003     SOCIAL HISTORY:  Social History   Socioeconomic History  . Marital status: Married    Spouse name: KZigmund Daniel . Number of children: 4  . Years of education: 154 . 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 Financial resource strain: Not on file  . Food insecurity:    Worry: Not on file    Inability: Not on file  . Transportation needs:    Medical: Not on file    Non-medical: Not on file  Tobacco Use  . Smoking status: Former Smoker    Last attempt to quit: 12/06/1996    Years since quitting: 22.0  . Smokeless tobacco: Never Used  Substance and Sexual Activity  . Alcohol use: Yes    Comment: a few shots a few times a month  . Drug use: No  . Sexual activity: Not on file  Lifestyle  . Physical activity:    Days per week: Not on file    Minutes per session: Not on file  . Stress: Not on file  Relationships  . Social connections:  Talks on phone: Not on file    Gets together: Not on file    Attends religious service: Not on file    Active member of club or organization: Not on file    Attends meetings of clubs or organizations: Not on file    Relationship status: Not on file  . Intimate partner violence:    Fear of current or ex partner: Not on file    Emotionally abused: Not on file    Physically abused: Not on file    Forced sexual activity: Not on file  Other Topics Concern  . 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   Lives at home with wife, daughter, family   Caffeine- sodas, 2 daily    FAMILY HISTORY:  Family History  Problem Relation Age of Onset  . Cancer Mother         lung, met to brain  . Diabetes Mellitus II Sister   . Colon cancer Neg Hx     CURRENT MEDICATIONS:  Outpatient Encounter Medications as of 01/03/2019  Medication Sig  . acetaminophen (TYLENOL) 325 MG tablet Take 650 mg by mouth every 6 (six) hours as needed.  Marland Kitchen albuterol (PROVENTIL) (2.5 MG/3ML) 0.083% nebulizer solution Take 3 mLs (2.5 mg total) by nebulization every 2 (two) hours as needed for wheezing or shortness of breath.  . allopurinol (ZYLOPRIM) 100 MG tablet TAKE 1 TABLET(100 MG) BY MOUTH AT BEDTIME (Patient taking differently: Take 100 mg by mouth every evening. )  . amLODipine (NORVASC) 10 MG tablet TAKE 1 TABLET BY MOUTH DAILY  . ATROVENT HFA 17 MCG/ACT inhaler INHALE 2 PUFFS BY MOUTH INTO THE LUNGS EVERY 6 HOURS (Patient taking differently: Inhale 2 puffs into the lungs every 6 (six) hours as needed for wheezing. )  . calcium carbonate (TUMS - DOSED IN MG ELEMENTAL CALCIUM) 500 MG chewable tablet Chew 1,000 mg by mouth at bedtime. Reported on 11/21/2015  . cephALEXin (KEFLEX) 500 MG capsule Take 1 capsule (500 mg total) by mouth 2 (two) times daily.  . cholecalciferol (VITAMIN D3) 25 MCG (1000 UT) tablet Take 1,000 Units by mouth every morning.  . cimetidine (TAGAMET) 200 MG tablet Take 200 mg by mouth at bedtime.  . citalopram (CELEXA) 10 MG tablet TAKE 1 TABLET(10 MG) BY MOUTH DAILY (Patient taking differently: Take 10 mg by mouth every morning. )  . clobetasol cream (TEMOVATE) 0.05 % Apply topically 2 (two) times daily.  . clopidogrel (PLAVIX) 75 MG tablet TAKE 1 TABLET(75 MG) BY MOUTH DAILY (Patient taking differently: Take 75 mg by mouth every morning. )  . diphenhydrAMINE (BENADRYL) 25 MG tablet Take 25 mg by mouth daily as needed for allergies.   . folic acid (FOLVITE) 1 MG tablet Take 1 tablet (1 mg total) by mouth daily.  . Glucosamine-Chondroit-Vit C-Mn (GLUCOSAMINE 1500 COMPLEX) CAPS Take 1,200 capsules by mouth 2 (two) times daily.  Marland Kitchen HYDROcodone-acetaminophen (NORCO)  10-325 MG tablet Take 1-2 tablets by mouth every 8 (eight) hours as needed.  Marland Kitchen levothyroxine (SYNTHROID, LEVOTHROID) 50 MCG tablet TAKE 1 TABLET(50 MCG) BY MOUTH DAILY BEFORE BREAKFAST (Patient taking differently: Take 50 mcg by mouth daily before breakfast. )  . metoprolol tartrate (LOPRESSOR) 50 MG tablet TAKE 1 TABLET(50 MG) BY MOUTH TWICE DAILY (Patient taking differently: Take 50 mg by mouth 2 (two) times daily. )  . mometasone (ASMANEX 60 METERED DOSES) 220 MCG/INH inhaler Inhale 2 puffs into  the lungs 2 (two) times daily.  . Multiple Vitamin (MULTIVITAMIN WITH MINERALS) TABS tablet Take 1 tablet by mouth daily.  Marland Kitchen neomycin-bacitracin-polymyxin (NEOSPORIN) ointment Apply 1 application topically as needed for wound care.  . Omega-3 Fatty Acids (FISH OIL) 1000 MG CPDR Take 1 capsule by mouth 2 (two) times daily.   . predniSONE (DELTASONE) 20 MG tablet Take 3 tablets (60 mg total) by mouth daily with breakfast. (Patient taking differently: Take 10-20 mg by mouth See admin instructions. Alternate taking 24m with 268mdaily as directed ([id number]))  . sulfamethoxazole-trimethoprim (BACTRIM DS,SEPTRA DS) 800-160 MG tablet Take 1 tablet by mouth every Monday, Wednesday, and Friday.  . vitamin B-12 (CYANOCOBALAMIN) 1000 MCG tablet Take 1,000 mcg by mouth every morning.   . vitamin C (ASCORBIC ACID) 500 MG tablet Take 500 mg by mouth 2 (two) times daily.  . vitamin E 400 UNIT capsule Take 400 Units by mouth every morning.    No facility-administered encounter medications on file as of 01/03/2019.     ALLERGIES:  Allergies  Allergen Reactions  . Contrast Media [Iodinated Diagnostic Agents]     Single functioning kidney  . Other Hives    Neoprene fabric Strawberry   . Ether Rash    Took a long time to wake up Doesn't wake up from it     PHYSICAL EXAM:  ECOG Performance status: 1  Vitals:   01/03/19 1100  BP: (!) 108/58  Pulse: 67  Resp: 16  Temp: 97.8 F (36.6 C)    SpO2: 95%   Filed Weights   01/03/19 1100  Weight: 269 lb (122 kg)    Physical Exam Constitutional:      Appearance: Normal appearance. He is normal weight.  Musculoskeletal: Normal range of motion.  Skin:    General: Skin is warm and dry.  Neurological:     Mental Status: He is alert and oriented to person, place, and time. Mental status is at baseline.  Psychiatric:        Mood and Affect: Mood normal.        Behavior: Behavior normal.        Thought Content: Thought content normal.        Judgment: Judgment normal.      LABORATORY DATA:  I have reviewed the labs as listed.  CBC    Component Value Date/Time   WBC 55.6 (HH) 01/03/2019 1108   RBC 4.25 01/03/2019 1108   RBC 4.25 01/03/2019 1108   HGB 12.4 (L) 01/03/2019 1108   HCT 42.3 01/03/2019 1108   PLT 168 01/03/2019 1108   MCV 99.5 01/03/2019 1108   MCH 29.2 01/03/2019 1108   MCHC 29.3 (L) 01/03/2019 1108   RDW 14.1 01/03/2019 1108   LYMPHSABS 45.2 (H) 01/03/2019 1108   MONOABS 6.7 (H) 01/03/2019 1108   EOSABS 0.2 01/03/2019 1108   BASOSABS 0.1 01/03/2019 1108   CMP Latest Ref Rng & Units 01/03/2019 12/05/2018 11/08/2018  Glucose 70 - 99 mg/dL 139(H) 140(H) 117(H)  BUN 8 - 23 mg/dL 25(H) 30(H) 21  Creatinine 0.61 - 1.24 mg/dL 1.89(H) 1.75(H) 1.69(H)  Sodium 135 - 145 mmol/L 139 140 139  Potassium 3.5 - 5.1 mmol/L 5.0 5.0 4.7  Chloride 98 - 111 mmol/L 106 111 107  CO2 22 - 32 mmol/L 25 22 24   Calcium 8.9 - 10.3 mg/dL 9.5 9.1 9.0  Total Protein 6.5 - 8.1 g/dL 6.2(L) 5.9(L) 6.1(L)  Total Bilirubin 0.3 - 1.2 mg/dL 0.3 0.4 0.4  Alkaline Phos 38 - 126 U/L 30(L) 29(L) 29(L)  AST 15 - 41 U/L 17 18 16   ALT 0 - 44 U/L 19 21 23        DIAGNOSTIC IMAGING:  I have independently reviewed the scans and discussed with the patient.   I have reviewed Francene Finders, NP's note and agree with the documentation.  I personally performed a face-to-face visit, made revisions and my assessment and plan is as  follows.    ASSESSMENT & PLAN:   CLL (chronic lymphocytic leukemia) ([place]) 1.  Autoimmune hemolytic anemia:  -He developed autoimmune hemolytic anemia as a complication of CLL.  Direct Coombs test was positive for IgG and Cd3.  We have started him on prednisone 1 mg/kg rounded of to 100 mg daily on 05/19/2018.  He is tolerating it very well. 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  His LDH has normalized.  We have cut back on his Procrit to 30,000 units on 06/07/2018.  We have also tapered his prednisone to 80 mg daily on 06/14/2018. - His prednisone dose was reduced to 60 mg on 06/21/2018.  He went on a trip to [place] for last 4 weeks.  He did not feel any severe fatigue. -Prednisone was dose reduced to 40 mg daily on 08/02/2018. -Prednisone dose reduced to 30 mg daily on 09/13/2018.   - Prednisone decreased to 20 mg daily on 10/22/2018.  - I have cut back on prednisone to 20 mg alternating with 10 mg on 11/08/2018.  His only complaint was weight gain. - Prednisone was further decreased to 10 mg daily on 12/07/2018. -He did not have any shortness of breath or severe tiredness.  Denies any fevers or infections. -We reviewed blood work today.  Hemoglobin is 12.4.  Reticulocyte count and LDH were normal.  I have recommended further cutting the dose of prednisone to 5 mg daily.  We will reevaluate him in 4 weeks.  We will also check direct Coombs test.  2.  Hypogammaglobulinemia: Despite his IgG levels in the 400 range, he does not have any recurrent infections requiring antibiotics.  He was treated with IVIG in February 2017, which was discontinued secondary to high cost.  3.  Stage III CKD: -Status post left nephrectomy for RCC in 2005. -His creatinine is slightly elevated at 1.8 today.  4.  ID prophylaxis: - He will continue Bactrim DS 1 tablet 3 times a week as long as he is on steroids.      Orders placed this encounter:  Orders  Placed This Encounter  Procedures  . Lactate dehydrogenase  . CBC with Differential/Platelet  . Comprehensive metabolic panel  . Reticulocytes  . Direct antiglobulin test      Derek Jack, MD [place] [phone]

## 2019-01-04 ENCOUNTER — Other Ambulatory Visit: Payer: Self-pay | Admitting: Family Medicine

## 2019-01-04 NOTE — Progress Notes (Unsigned)
CRITICAL VALUE ALERT  Critical Value:  WBC 55.6  Date & Time Notied:  01/03/2019 at 1137  Provider Notified: Dr. Delton Coombes  Orders Received/Actions taken: n/a

## 2019-01-04 NOTE — Telephone Encounter (Signed)
Ok to refill??  Last office visit 04/07/2018.  Last refill 12/08/2018.

## 2019-01-05 MED ORDER — CLOPIDOGREL BISULFATE 75 MG PO TABS: 75.0000 mg | ORAL_TABLET | Freq: Every day | ORAL | 0 refills | Status: DC

## 2019-01-05 MED ORDER — HYDROCODONE-ACETAMINOPHEN 10-325 MG PO TABS: 1.0000 | ORAL_TABLET | Freq: Three times a day (TID) | ORAL | 0 refills | Status: DC | PRN

## 2019-01-05 NOTE — Telephone Encounter (Signed)
PT needs OV- 30 minute slot

## 2019-01-10 ENCOUNTER — Ambulatory Visit: Payer: Medicare Other | Admitting: Family Medicine

## 2019-01-12 ENCOUNTER — Ambulatory Visit (INDEPENDENT_AMBULATORY_CARE_PROVIDER_SITE_OTHER): Payer: Medicare Other | Admitting: Family Medicine

## 2019-01-12 ENCOUNTER — Encounter: Payer: Self-pay | Admitting: Family Medicine

## 2019-01-12 ENCOUNTER — Other Ambulatory Visit: Payer: Self-pay

## 2019-01-12 VITALS — BP 120/64 | HR 74 | Temp 98.2°F | Resp 18 | Ht 67.0 in | Wt 270.0 lb

## 2019-01-12 DIAGNOSIS — I10 Essential (primary) hypertension: Secondary | ICD-10-CM

## 2019-01-12 DIAGNOSIS — Z6841 Body Mass Index (BMI) 40.0 and over, adult: Secondary | ICD-10-CM

## 2019-01-12 DIAGNOSIS — J449 Chronic obstructive pulmonary disease, unspecified: Secondary | ICD-10-CM | POA: Diagnosis not present

## 2019-01-12 DIAGNOSIS — G894 Chronic pain syndrome: Secondary | ICD-10-CM

## 2019-01-12 DIAGNOSIS — N183 Chronic kidney disease, stage 3 unspecified: Secondary | ICD-10-CM

## 2019-01-12 DIAGNOSIS — E038 Other specified hypothyroidism: Secondary | ICD-10-CM | POA: Diagnosis not present

## 2019-01-12 DIAGNOSIS — Z8673 Personal history of transient ischemic attack (TIA), and cerebral infarction without residual deficits: Secondary | ICD-10-CM

## 2019-01-12 DIAGNOSIS — R7303 Prediabetes: Secondary | ICD-10-CM

## 2019-01-12 NOTE — Progress Notes (Signed)
Subjective:    Patient ID: [name] Ball, male    DOB: Jan 06, 1951, 68 y.o.   MRN: [id number]  Patient presents for Follow-up (is fasting)  Patient here to follow-up chronic medical problems.  He has multiple medications.  [facility] followed by oncology for his CLL he was last seen on January 29 by them.  Prednisone was decreased to 5 mg daily.  His hemoglobin has been has been steady.  He is not needing any antibiotics recently.  He is not on any active treatment for his CLL he is on prophylactic Bactrim DS 3 times a week as long as he is on steroids   Chronic kidney disease he is status post history of left nephrectomy his creatinine was up to 1.89 which is up from his baseline.  Is been seen by orthopedics for his knee pain, and Hip, has known OA, not a surgical candidate, given knee injection Weight gain has contributed to more pain   Hypertension- he is taking his blood pressure medicines as prescribed   Diabetes his last A1c was normal in May 2019  Anemia triglycerides have been chronically elevated his last to 46 in May 2019  COPD  he is maintained on his Asmanex inhaler, has some wheezing episodes but has not been using his inhaler   Hypothyroidism- taking synthroid   History of CVA- on plavix   OSA- using CPAP at bedtime and for naps   Review Of Systems:  GEN- denies fatigue, fever, weight loss,weakness, recent illness HEENT- denies eye drainage, change in vision, nasal discharge, CVS- denies chest pain, palpitations RESP- denies SOB, cough, wheeze ABD- denies N/V, change in stools, abd pain GU- denies dysuria, hematuria, dribbling, incontinence MSK- denies joint pain, muscle aches, injury Neuro- denies headache, dizziness, syncope, seizure activity       Objective:    BP 120/64   Pulse 74   Temp 98.2 F (36.8 C) (Oral)   Resp 18   Ht 5\' 7"  (1.702 m)   Wt 270 lb (122.5 kg)   SpO2 97%   BMI 42.29 kg/m  GEN- NAD, alert and oriented x3 HEENT- PERRL,  EOMI, non injected sclera, pink conjunctiva, MMM, oropharynx clear Neck- Supple, no thyromegaly CVS- RRR, no murmur RESP-CTAB ABD-NABS,soft,NT,ND Skin - small bruises on forearms , no hematoma EXT- No edema Pulses- Radial, DP- 2+        Assessment & Plan:      Problem List Items Addressed This Visit      Unprioritized   Chronic pain syndrome    Continue with Percocet as needed.      CKD (chronic kidney disease), stage III (HCC)    Worsening creatinine his baseline is about 1.7 he is going to have his labs rechecked in 3 weeks.  Discussed with him drinking water he has been eating a lot of sugary items and beverages.  We will see if his creatinine returned to his baseline.  If it continues to creep up we will get him over to nephrology as he has 1 functioning kidney.      COPD with asthma ([place])    Currently compensated he does have his nebs and inhalers at home.  He is also on low-dose prednisone even though this is for his leukemia      Relevant Medications   prednisoLONE 5 MG TABS tablet   Essential hypertension, benign - Primary    Blood pressure controlled no change in medication.      Relevant Orders  Lipid panel   History of CVA (cerebrovascular accident)    Has residual numbness on his face but overall doing well.  Continue Plavix try to control his other comorbidities including prediabetes obesity hypertension      Hypothyroidism    Check TSH continue Synthroid      Relevant Orders   TSH   Obesity    Discussed dietary changes that can be made.  Note he is on blood thinner as well as the prednisone making his skin very thin and he easily bruises      Prediabetes   Relevant Orders   Hemoglobin A1c      Note: This dictation was prepared with Dragon dictation along with smaller phrase technology. Any transcriptional errors that result from this process are unintentional.

## 2019-01-12 NOTE — Patient Instructions (Signed)
Continue current medications We will call with lab results F/U 4-5 months for Wellness Exam

## 2019-01-12 NOTE — Assessment & Plan Note (Signed)
Worsening creatinine his baseline is about 1.7 he is going to have his labs rechecked in 3 weeks.  Discussed with him drinking water he has been eating a lot of sugary items and beverages.  We will see if his creatinine returned to his baseline.  If it continues to creep up we will get him over to nephrology as he has 1 functioning kidney.

## 2019-01-12 NOTE — Assessment & Plan Note (Signed)
Has residual numbness on his face but overall doing well.  Continue Plavix try to control his other comorbidities including prediabetes obesity hypertension

## 2019-01-12 NOTE — Assessment & Plan Note (Signed)
Currently compensated he does have his nebs and inhalers at home.  He is also on low-dose prednisone even though this is for his leukemia

## 2019-01-12 NOTE — Assessment & Plan Note (Signed)
Continue with Percocet as needed.

## 2019-01-12 NOTE — Assessment & Plan Note (Signed)
Discussed dietary changes that can be made.  Note he is on blood thinner as well as the prednisone making his skin very thin and he easily bruises

## 2019-01-12 NOTE — Assessment & Plan Note (Signed)
Check TSH continue Synthroid ?

## 2019-01-12 NOTE — Assessment & Plan Note (Signed)
Blood pressure controlled no change in medication. 

## 2019-01-13 LAB — LIPID PANEL
CHOLESTEROL: 206 mg/dL — AB (ref <200)
HDL: 31 mg/dL — ABNORMAL LOW (ref >=40)
LDL Cholesterol (Calc): 119 mg/dL (calc) — ABNORMAL HIGH
Non-HDL Cholesterol (Calc): 175 mg/dL (calc) — ABNORMAL HIGH (ref <130)
Total CHOL/HDL Ratio: 6.6 (calc) — ABNORMAL HIGH (ref <5.0)
Triglycerides: 390 mg/dL — ABNORMAL HIGH (ref <150)

## 2019-01-13 LAB — HEMOGLOBIN A1C
Hgb A1c MFr Bld: 5.2 % of total Hgb (ref <5.7)
Mean Plasma Glucose: 103 (calc)
eAG (mmol/L): 5.7 (calc)

## 2019-01-13 LAB — TSH: TSH: 2.17 mIU/L (ref 0.40–4.50)

## 2019-01-18 ENCOUNTER — Encounter: Payer: Self-pay | Admitting: Family Medicine

## 2019-01-29 ENCOUNTER — Other Ambulatory Visit: Payer: Self-pay | Admitting: Family Medicine

## 2019-01-30 ENCOUNTER — Other Ambulatory Visit: Payer: Self-pay | Admitting: Family Medicine

## 2019-01-30 MED ORDER — HYDROCODONE-ACETAMINOPHEN 10-325 MG PO TABS: 1.0000 | ORAL_TABLET | Freq: Three times a day (TID) | ORAL | 0 refills | Status: DC | PRN

## 2019-01-30 NOTE — Telephone Encounter (Signed)
Ok to refill??  Last office visit 01/12/2019.  Last refill 01/05/2019.

## 2019-01-31 ENCOUNTER — Other Ambulatory Visit (HOSPITAL_COMMUNITY): Payer: Medicare Other

## 2019-02-05 ENCOUNTER — Inpatient Hospital Stay (HOSPITAL_COMMUNITY): Payer: Medicare Other | Attending: Hematology

## 2019-02-05 DIAGNOSIS — C9110 Chronic lymphocytic leukemia of B-cell type not having achieved remission: Secondary | ICD-10-CM | POA: Insufficient documentation

## 2019-02-05 DIAGNOSIS — N183 Chronic kidney disease, stage 3 (moderate): Secondary | ICD-10-CM | POA: Insufficient documentation

## 2019-02-05 DIAGNOSIS — D591 Other autoimmune hemolytic anemias: Secondary | ICD-10-CM | POA: Insufficient documentation

## 2019-02-05 DIAGNOSIS — D801 Nonfamilial hypogammaglobulinemia: Secondary | ICD-10-CM | POA: Insufficient documentation

## 2019-02-05 LAB — CBC WITH DIFFERENTIAL/PLATELET
Abs Immature Granulocytes: 0.21 10*3/uL — ABNORMAL HIGH (ref 0.00–0.07)
Basophils Absolute: 0.1 10*3/uL (ref 0.0–0.1)
Basophils Relative: 0 %
EOS ABS: 0.3 10*3/uL (ref 0.0–0.5)
Eosinophils Relative: 1 %
HEMATOCRIT: 38.6 % — AB (ref 39.0–52.0)
Hemoglobin: 11.6 g/dL — ABNORMAL LOW (ref 13.0–17.0)
Immature Granulocytes: 0 %
Lymphocytes Relative: 79 %
Lymphs Abs: 36.7 10*3/uL — ABNORMAL HIGH (ref 0.7–4.0)
MCH: 29.4 pg (ref 26.0–34.0)
MCHC: 30.1 g/dL (ref 30.0–36.0)
MCV: 98 fL (ref 80.0–100.0)
Monocytes Absolute: 6.6 10*3/uL — ABNORMAL HIGH (ref 0.1–1.0)
Monocytes Relative: 14 %
Neutro Abs: 2.8 10*3/uL (ref 1.7–7.7)
Neutrophils Relative %: 6 %
Platelets: 164 10*3/uL (ref 150–400)
RBC: 3.94 MIL/uL — ABNORMAL LOW (ref 4.22–5.81)
RDW: 13.9 % (ref 11.5–15.5)
WBC: 46.7 10*3/uL — ABNORMAL HIGH (ref 4.0–10.5)
nRBC: 0 % (ref 0.0–0.2)

## 2019-02-05 LAB — COMPREHENSIVE METABOLIC PANEL
ALT: 19 U/L (ref 0–44)
AST: 20 U/L (ref 15–41)
Albumin: 4.1 g/dL (ref 3.5–5.0)
Alkaline Phosphatase: 34 U/L — ABNORMAL LOW (ref 38–126)
Anion gap: 9 (ref 5–15)
BUN: 23 mg/dL (ref 8–23)
CO2: 24 mmol/L (ref 22–32)
Calcium: 9.3 mg/dL (ref 8.9–10.3)
Chloride: 105 mmol/L (ref 98–111)
Creatinine, Ser: 1.65 mg/dL — ABNORMAL HIGH (ref 0.61–1.24)
GFR calc Af Amer: 49 mL/min — ABNORMAL LOW (ref >60)
GFR calc non Af Amer: 42 mL/min — ABNORMAL LOW (ref >60)
Glucose, Bld: 136 mg/dL — ABNORMAL HIGH (ref 70–99)
Potassium: 4.8 mmol/L (ref 3.5–5.1)
Sodium: 138 mmol/L (ref 135–145)
TOTAL PROTEIN: 6 g/dL — AB (ref 6.5–8.1)
Total Bilirubin: 0.5 mg/dL (ref 0.3–1.2)

## 2019-02-05 LAB — RETICULOCYTES
Immature Retic Fract: 23.3 % — ABNORMAL HIGH (ref 2.3–15.9)
RBC.: 3.94 MIL/uL — ABNORMAL LOW (ref 4.22–5.81)
Retic Count, Absolute: 52.3 10*3/uL (ref 19.0–186.0)
Retic Ct Pct: 1.3 % (ref 0.4–3.1)

## 2019-02-05 LAB — LACTATE DEHYDROGENASE: LDH: 206 U/L — ABNORMAL HIGH (ref 98–192)

## 2019-02-07 ENCOUNTER — Encounter (INDEPENDENT_AMBULATORY_CARE_PROVIDER_SITE_OTHER): Payer: Self-pay | Admitting: Orthopaedic Surgery

## 2019-02-07 ENCOUNTER — Ambulatory Visit (INDEPENDENT_AMBULATORY_CARE_PROVIDER_SITE_OTHER): Payer: Medicare Other | Admitting: Orthopaedic Surgery

## 2019-02-07 ENCOUNTER — Encounter (HOSPITAL_COMMUNITY): Payer: Self-pay | Admitting: Hematology

## 2019-02-07 ENCOUNTER — Other Ambulatory Visit: Payer: Self-pay

## 2019-02-07 ENCOUNTER — Inpatient Hospital Stay (HOSPITAL_BASED_OUTPATIENT_CLINIC_OR_DEPARTMENT_OTHER): Payer: Medicare Other | Admitting: Hematology

## 2019-02-07 VITALS — BP 126/69 | HR 64 | Ht 67.0 in | Wt 270.0 lb

## 2019-02-07 VITALS — BP 129/61 | HR 63 | Temp 97.6°F | Resp 20 | Wt 270.3 lb

## 2019-02-07 DIAGNOSIS — N183 Chronic kidney disease, stage 3 (moderate): Secondary | ICD-10-CM | POA: Diagnosis not present

## 2019-02-07 DIAGNOSIS — D591 Other autoimmune hemolytic anemias: Secondary | ICD-10-CM

## 2019-02-07 DIAGNOSIS — M1711 Unilateral primary osteoarthritis, right knee: Secondary | ICD-10-CM | POA: Diagnosis not present

## 2019-02-07 DIAGNOSIS — C9110 Chronic lymphocytic leukemia of B-cell type not having achieved remission: Secondary | ICD-10-CM

## 2019-02-07 DIAGNOSIS — D801 Nonfamilial hypogammaglobulinemia: Secondary | ICD-10-CM | POA: Diagnosis not present

## 2019-02-07 MED ORDER — BUPIVACAINE HCL 0.5 % IJ SOLN
2.0000 mL | INTRAMUSCULAR | Status: AC | PRN
  Administered 2019-02-07: 2 mL via INTRA_ARTICULAR

## 2019-02-07 MED ORDER — LIDOCAINE HCL 1 % IJ SOLN
2.0000 mL | INTRAMUSCULAR | Status: AC | PRN
  Administered 2019-02-07: 2 mL

## 2019-02-07 MED ORDER — METHYLPREDNISOLONE ACETATE 40 MG/ML IJ SUSP
80.0000 mg | INTRAMUSCULAR | Status: AC | PRN
  Administered 2019-02-07: 80 mg via INTRA_ARTICULAR

## 2019-02-07 NOTE — Assessment & Plan Note (Signed)
1.  Autoimmune hemolytic anemia: -He developed autoimmune hemolytic anemia as a complication of CLL.  Direct Coombs test was positive for IgG and Cd3.  We have started him on prednisone 1 mg/kg rounded of to 100 mg daily on 05/19/2018.  He is tolerating it very well. 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  His LDH has normalized.  We have cut back on his Procrit to 30,000 units on 06/07/2018.  We have also tapered his prednisone to 80 mg daily on 06/14/2018. - His prednisone dose was reduced to 60 mg on 06/21/2018.  He went on a trip to [place] for last 4 weeks.  He did not feel any severe fatigue. -Prednisone was dose reduced to 40 mg daily on 08/02/2018. -Prednisone dose reduced to 30 mg daily on 09/13/2018.   - Prednisone decreased to 20 mg daily on 10/22/2018.  - I have cut back on prednisone to 20 mg alternating with 10 mg on 11/08/2018.  His only complaint was weight gain. - Prednisone was further decreased to 10 mg daily on 12/07/2018. -He denies any recent infections or hospitalizations.  No respiratory symptoms. -We reviewed blood work which showed hemoglobin has slightly dropped to 11.6.  LDH is also elevated about 200.  Hence I will continue prednisone 5 mg daily and would not taper today. -I will reevaluate him in 4 weeks.  If the hemoglobin is stable or improves, will consider prednisone taper to 5 mg every other day at that time.   2.  Hypogammaglobulinemia: Despite his IgG levels in the 400 range, he does not have any recurrent infections requiring antibiotics.  He was treated with IVIG in February 2017, which was discontinued secondary to high cost.  3.  Stage III CKD: -Status post left nephrectomy for RCC in 2005. -His creatinine is stable around 1.65 today.  4.  ID prophylaxis: -We will continue Bactrim DS 1 tablet 3 times a week as long as he is on prednisone.

## 2019-02-07 NOTE — Progress Notes (Signed)
[name] Ball [name] Ball, [name] Ball [id number]   CLINIC:  Medical Oncology/Hematology  [name] Ball:  [name] Rossetti, MD [address] [place] [address] [phone]   REASON FOR VISIT: Follow-up for CLL AND autoimmune hemolytic anemia  CURRENT THERAPY:Starting prednisone49meverydaystarting 01/03/19, and bactrim every other day  INTERVAL HISTORY:  [name] returns for routine follow-up for CLL and autoimmune hemolytic anemia. He is here with his wife. He reports he lost all the hair on his arms and axillary. He reports he has been unchanged since his last visit. Denies any nausea, vomiting, or diarrhea. Denies any new pains. Had not noticed any recent bleeding such as epistaxis, hematuria or hematochezia. Denies recent chest pain on exertion, shortness of breath on minimal exertion, pre-syncopal episodes, or palpitations. Denies any numbness or tingling in hands or feet. Denies any recent fevers, infections, or recent hospitalizations. Patient reports appetite at 100% and energy level at 50%. He has been eating well and maintaining his weight.     REVIEW OF SYSTEMS:  Review of Systems  Constitutional: Positive for fatigue.  Respiratory: Positive for cough and shortness of breath.   Cardiovascular: Positive for leg swelling.  Neurological: Positive for dizziness.  Hematological: Bruises/bleeds easily.  All other systems reviewed and are negative.    PAST MEDICAL/SURGICAL HISTORY:  Past Medical History:  Diagnosis Date  . Allergy   . Arthritis   . Asthma   . Cancer ([facility] [place] [facility]    kid[place]  . C[place] ([place]ocytic [place]) ([facility] [place]  . COPD ([place] pulmonary disease) [place]   . Diverticulitis   . Diverticulosis    colonoscopy 3/15  . GERD (gastroesophageal reflux disease)   . Hypertension   . Low testosterone   . Migraine   . Shingles 03/31/2016  . Solar purpura ([facility]   . Thyroid disease    hypothryoid  . Ulcer    . Ventral hernia FEB 2015 CT    Large infra umbilical ventral hernia containing loops of small   Past Surgical History:  Procedure Laterality Date  . abdominal mesh inserted  june 2006, may 2008,dec 2010  . CHOLECYSTECTOMY  june 2007  . COLON SURGERY  sept 2007  . COLONOSCOPY    . COLONOSCOPY N/A 02/20/2014   Procedure: COLONOSCOPY;  Surgeon: SDanie Binder MD;  [facility];  Service: Endoscopy;  Laterality: N/A;  . HERNIA REPAIR  05/2005 5/20108, 11/2009   with mesh  . KIDNEY CYST REMOVAL  jan 2005   renal cell carcinoma  . knee minescus repair Left feb 2007  . NEPHRECTOMY Left 12/2003     SOCIAL HISTORY:  Social History   Socioeconomic History  . Marital status: Married    Spouse name: [name] Ball . Number of children: 4  . Years of education: 152 . 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 Financial resource strain: Not on file  . Food insecurity:    Worry: Not on file    Inability: Not on file  . Transportation needs:    Medical: Not on file    Non-medical: Not on file  Tobacco Use  . Smoking status: Former Smoker    Last attempt to quit: 12/06/1996    Years since quitting: 22.1  . Smokeless tobacco: Never Used  Substance and Sexual Activity  . Alcohol use: Yes    Comment: a few shots a  few times a month  . Drug use: No  . Sexual activity: Not on file  Lifestyle  . Physical activity:    Days per week: Not on file    Minutes per session: Not on file  . Stress: Not on file  Relationships  . Social connections:    Talks on phone: Not on file    Gets together: Not on file    Attends religious service: Not on file    Active member of club or organization: Not on file    Attends meetings of clubs or organizations: Not on file    Relationship status: Not on file  . Intimate partner violence:    Fear of current or ex partner: Not on file    Emotionally abused: Not on file    Physically abused: Not  on file    Forced sexual activity: Not on file  Other Topics Concern  . 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   Lives at home with wife, daughter, family   Caffeine- sodas, 2 daily    FAMILY HISTORY:  Family History  Problem Relation Age of Onset  . Cancer Mother        lung, met to brain  . Diabetes Mellitus II Sister   . Colon cancer Neg Hx     CURRENT MEDICATIONS:  Outpatient Encounter Medications as of 02/07/2019  Medication Sig  . acetaminophen (TYLENOL) 325 MG tablet Take 650 mg by mouth every 6 (six) hours as needed.  Marland Kitchen albuterol (PROVENTIL) (2.5 MG/3ML) 0.083% nebulizer solution Take 3 mLs (2.5 mg total) by nebulization every 2 (two) hours as needed for wheezing or shortness of breath.  . allopurinol (ZYLOPRIM) 100 MG tablet TAKE 1 TABLET(100 MG) BY MOUTH AT BEDTIME (Patient taking differently: Take 100 mg by mouth every evening. )  . amLODipine (NORVASC) 10 MG tablet TAKE 1 TABLET BY MOUTH DAILY  . ATROVENT HFA 17 MCG/ACT inhaler INHALE 2 PUFFS BY MOUTH INTO THE LUNGS EVERY 6 HOURS (Patient taking differently: Inhale 2 puffs into the lungs every 6 (six) hours as needed for wheezing. )  . calcium carbonate (TUMS - DOSED IN MG ELEMENTAL CALCIUM) 500 MG chewable tablet Chew 1,000 mg by mouth at bedtime. Reported on 11/21/2015  . cholecalciferol (VITAMIN D3) 25 MCG (1000 UT) tablet Take 1,000 Units by mouth every morning.  . cimetidine (TAGAMET) 200 MG tablet Take 200 mg by mouth at bedtime.  . citalopram (CELEXA) 10 MG tablet TAKE 1 TABLET(10 MG) BY MOUTH DAILY  . clobetasol cream (TEMOVATE) 0.05 % Apply topically 2 (two) times daily.  . clopidogrel (PLAVIX) 75 MG tablet Take 1 tablet (75 mg total) by mouth daily.  . diphenhydrAMINE (BENADRYL) 25 MG tablet Take 25 mg by mouth daily as needed for allergies.   . folic acid (FOLVITE) 1 MG tablet Take 1 tablet (1 mg total) by mouth daily.  . Glucosamine-Chondroit-Vit C-Mn  (GLUCOSAMINE 1500 COMPLEX) CAPS Take 1,200 capsules by mouth 2 (two) times daily.  Marland Kitchen HYDROcodone-acetaminophen (NORCO) 10-325 MG tablet Take 1-2 tablets by mouth every 8 (eight) hours as needed.  Marland Kitchen levothyroxine (SYNTHROID, LEVOTHROID) 50 MCG tablet TAKE 1 TABLET(50 MCG) BY MOUTH DAILY BEFORE BREAKFAST (Patient taking differently: Take 50 mcg by mouth daily before breakfast. )  . metoprolol tartrate (LOPRESSOR) 50 MG tablet TAKE 1 TABLET(50 MG) BY MOUTH TWICE DAILY (Patient taking differently: Take 50 mg by mouth 2 (two) times  daily. )  . mometasone (ASMANEX 60 METERED DOSES) 220 MCG/INH inhaler Inhale 2 puffs into the lungs 2 (two) times daily.  . Multiple Vitamin (MULTIVITAMIN WITH MINERALS) TABS tablet Take 1 tablet by mouth daily.  Marland Kitchen neomycin-bacitracin-polymyxin (NEOSPORIN) ointment Apply 1 application topically as needed for wound care.  . Omega-3 Fatty Acids (FISH OIL) 1000 MG CPDR Take 1 capsule by mouth 2 (two) times daily.   . prednisoLONE 5 MG TABS tablet Take 5 mg by mouth daily.  Marland Kitchen sulfamethoxazole-trimethoprim (BACTRIM DS,SEPTRA DS) 800-160 MG tablet Take 1 tablet by mouth every Monday, Wednesday, and Friday.  . vitamin B-12 (CYANOCOBALAMIN) 1000 MCG tablet Take 1,000 mcg by mouth every morning.   . vitamin C (ASCORBIC ACID) 500 MG tablet Take 500 mg by mouth 2 (two) times daily.  . vitamin E 400 UNIT capsule Take 400 Units by mouth every morning.    No facility-administered encounter medications on file as of 02/07/2019.     ALLERGIES:  Allergies  Allergen Reactions  . Contrast Media [Iodinated Diagnostic Agents]     Single functioning kidney  . Other Hives    Neoprene fabric Strawberry   . Ether Rash    Took a long time to wake up Doesn't wake up from it     PHYSICAL EXAM:  ECOG Performance status: 1  Vitals:   02/07/19 1045  BP: 129/61  Pulse: 63  Resp: 20  Temp: 97.6 F (36.4 C)  SpO2: 97%   Filed Weights   02/07/19 1045  Weight: 270 lb 4.8 oz (122.6 kg)      Physical Exam Constitutional:      Appearance: Normal appearance. He is normal weight.  Cardiovascular:     Rate and Rhythm: Normal rate and regular rhythm.     Heart sounds: Normal heart sounds.  Pulmonary:     Effort: Pulmonary effort is normal.     Breath sounds: Normal breath sounds.  Musculoskeletal: Normal range of motion.  Skin:    General: Skin is warm and dry.  Neurological:     Mental Status: He is alert and oriented to person, place, and time. Mental status is at baseline.  Psychiatric:        Mood and Affect: Mood normal.        Behavior: Behavior normal.        Thought Content: Thought content normal.        Judgment: Judgment normal.      LABORATORY DATA:  I have reviewed the labs as listed.  CBC    Component Value Date/Time   WBC 46.7 (H) 02/05/2019 1125   RBC 3.94 (L) 02/05/2019 1125   RBC 3.94 (L) 02/05/2019 1125   HGB 11.6 (L) 02/05/2019 1125   HCT 38.6 (L) 02/05/2019 1125   PLT 164 02/05/2019 1125   MCV 98.0 02/05/2019 1125   MCH 29.4 02/05/2019 1125   MCHC 30.1 02/05/2019 1125   RDW 13.9 02/05/2019 1125   LYMPHSABS 36.7 (H) 02/05/2019 1125   MONOABS 6.6 (H) 02/05/2019 1125   EOSABS 0.3 02/05/2019 1125   BASOSABS 0.1 02/05/2019 1125   CMP Latest Ref Rng & Units 02/05/2019 01/03/2019 12/05/2018  Glucose 70 - 99 mg/dL 136(H) 139(H) 140(H)  BUN 8 - 23 mg/dL 23 25(H) 30(H)  Creatinine 0.61 - 1.24 mg/dL 1.65(H) 1.89(H) 1.75(H)  Sodium 135 - 145 mmol/L 138 139 140  Potassium 3.5 - 5.1 mmol/L 4.8 5.0 5.0  Chloride 98 - 111 mmol/L 105 106 111  CO2 22 -  32 mmol/L 24 25 22   Calcium 8.9 - 10.3 mg/dL 9.3 9.5 9.1  Total Protein 6.5 - 8.1 g/dL 6.0(L) 6.2(L) 5.9(L)  Total Bilirubin 0.3 - 1.2 mg/dL 0.5 0.3 0.4  Alkaline Phos 38 - 126 U/L 34(L) 30(L) 29(L)  AST 15 - 41 U/L 20 17 18   ALT 0 - 44 U/L 19 19 21        DIAGNOSTIC IMAGING:  I have independently reviewed the scans and discussed with the patient.   I have reviewed Francene Finders, NP's note  and agree with the documentation.  I personally performed a face-to-face visit, made revisions and my assessment and plan is as follows.    ASSESSMENT & PLAN:   CLL (chronic lymphocytic leukemia) ([place]) 1.  Autoimmune hemolytic anemia: -He developed autoimmune hemolytic anemia as a complication of CLL.  Direct Coombs test was positive for IgG and Cd3.  We have started him on prednisone 1 mg/kg rounded of to 100 mg daily on 05/19/2018.  He is tolerating it very well. 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  His LDH has normalized.  We have cut back on his Procrit to 30,000 units on 06/07/2018.  We have also tapered his prednisone to 80 mg daily on 06/14/2018. - His prednisone dose was reduced to 60 mg on 06/21/2018.  He went on a trip to [place] for last 4 weeks.  He did not feel any severe fatigue. -Prednisone was dose reduced to 40 mg daily on 08/02/2018. -Prednisone dose reduced to 30 mg daily on 09/13/2018.   - Prednisone decreased to 20 mg daily on 10/22/2018.  - I have cut back on prednisone to 20 mg alternating with 10 mg on 11/08/2018.  His only complaint was weight gain. - Prednisone was further decreased to 10 mg daily on 12/07/2018. -He denies any recent infections or hospitalizations.  No respiratory symptoms. -We reviewed blood work which showed hemoglobin has slightly dropped to 11.6.  LDH is also elevated about 200.  Hence I will continue prednisone 5 mg daily and would not taper today. -I will reevaluate him in 4 weeks.  If the hemoglobin is stable or improves, will consider prednisone taper to 5 mg every other day at that time.   2.  Hypogammaglobulinemia: Despite his IgG levels in the 400 range, he does not have any recurrent infections requiring antibiotics.  He was treated with IVIG in February 2017, which was discontinued secondary to high cost.  3.  Stage III CKD: -Status post left nephrectomy for RCC in 2005. -His  creatinine is stable around 1.65 today.  4.  ID prophylaxis: -We will continue Bactrim DS 1 tablet 3 times a week as long as he is on prednisone.       Orders placed this encounter:  Orders Placed This Encounter  Procedures  . Reticulocytes  . Lactate dehydrogenase  . CBC with Differential/Platelet  . Comprehensive metabolic panel      Derek Jack, MD [place] [phone]

## 2019-02-07 NOTE — Progress Notes (Signed)
 Office Visit Note   Patient: [name] Ball           Date of Birth: 01/28/1951           MRN: [id number] Visit Date: 02/07/2019              Requested by: [place], Kawanta F, MD [address] [place], [place] [address] PCP: [place], Kawanta F, MD   Assessment & Plan: Visit Diagnoses:  1. Unilateral primary osteoarthritis, right knee     End-stage osteoarthritis right knee.  [name] Ball has a number of significant comorbidities including COPD, chronic lymphocytic leukemia and chronic renal disease.  Has been wearing a knee support.  Limited in his ability to take medicines that would help his knee pain.  Also having difficulty losing weight because of all of his medicines.  He is on prednisone 5 mg a day for his CLL.  Reinject right knee with cortisone.  Long discussion regarding future treatment options and trying to spread out over many months the injections  Follow-Up Instructions: Return if symptoms worsen or fail to improve.   Orders:  No orders of the defined types were placed in this encounter.  No orders of the defined types were placed in this encounter.     Procedures: Large Joint Inj: R knee on 02/07/2019 1:48 PM Indications: pain and diagnostic evaluation Details: 25 G 1.5 in needle, anteromedial approach  Arthrogram: No  Medications: 2 mL lidocaine 1 %; 2 mL bupivacaine 0.5 %; 80 mg methylPREDNISolone acetate 40 MG/ML Procedure, treatment alternatives, risks and benefits explained, specific risks discussed. Consent was given by the patient. Immediately prior to procedure a time out was called to verify the correct patient, procedure, equipment, support staff and site/side marked as required. Patient was prepped and draped in the usual sterile fashion.       Clinical Data: No additional findings.   Subjective: Chief Complaint  Patient presents with  . Right Knee - Pain  [name] Ball has evidence of end-stage osteoarthritis of the right knee by prior  films.  I have injected his right knee with cortisone on several occasions in the past year with excellent temporary relief.  He has a number of medical comorbidities that preclude surgery including obesity, chronic lymphocytic leukemia presently on prednisone, COPD, chronic kidney disease  HPI  Review of Systems   Objective: Vital Signs: BP 126/69   Pulse 64   Ht 5' 7" (1.702 m)   Wt 270 lb (122.5 kg)   BMI 42.29 kg/m   Physical Exam Constitutional:      Appearance: He is well-developed.  Eyes:     Pupils: Pupils are equal, round, and reactive to light.  Pulmonary:     Effort: Pulmonary effort is normal.  Skin:    General: Skin is warm and dry.  Neurological:     Mental Status: He is alert and oriented to person, place, and time.  Psychiatric:        Behavior: Behavior normal.     Ortho Exam awake alert and oriented x3.  Hard of hearing.  Wife accompanies him.  Right knee with increased varus.  Full extension and flexed about 100 degrees.  No instability.  Possibly very small effusion  Specialty Comments:  No specialty comments available.  Imaging: No results found.   PMFS History: Patient Active Problem List   Diagnosis Date Noted  . Unilateral primary osteoarthritis, right knee 02/07/2019  . Chronic pain of right knee 11/22/2018  .   History of CVA (cerebrovascular accident) 03/18/2017  . Complicated migraine 03/09/2017  . Asthma 03/09/2017  . Hypogammaglobulinemia (HCC) 01/27/2016  . CLL (chronic lymphocytic leukemia) (HCC) 11/10/2015  . Prediabetes 04/08/2015  . Depression 12/10/2014  . Low testosterone 12/10/2014  . Essential hypertension, benign 08/09/2014  . Sleep apnea 08/09/2014  . Onychomycosis 08/09/2014  . Diverticulosis of colon with hemorrhage 02/20/2014  . GI bleed 02/18/2014  . Recurrent ventral incisional hernia 01/21/2014  . Abdominal wall hernia 11/19/2013  . Hand dermatitis 09/18/2013  . Hypothyroidism 07/17/2013  . CKD (chronic kidney  disease), stage III (HCC) 07/17/2013  . Obesity 07/17/2013  . Chronic pain syndrome 07/17/2013  . COPD with asthma (HCC) 07/17/2013  . GERD (gastroesophageal reflux disease) 07/17/2013   Past Medical History:  Diagnosis Date  . Allergy   . Arthritis   . Asthma   . Cancer (HCC)    kidney  . CLL (chronic lymphocytic leukemia) (H[facility]) 2016  . COPD (chronic obstructive pulmonary disease) (HCC)   . Diverticulitis   . Diverticulosis    colonoscopy 3/15  . GERD (gastroesophageal reflux disease)   . Hypertension   . Low testosterone   . Migraine   . Shingles 03/31/2016  . Solar purpura (HCC)   . Thyroid disease    hypothryoid  . Ulcer   . Ventral hernia FEB 2015 CT    Large infra umbilical ventral hernia containing loops of small    Family History  Problem Relation Age of Onset  . Cancer Mother        lung, met to brain  . Diabetes Mellitus II Sister   . Colon cancer Neg Hx     Past Surgical History:  Procedure Laterality Date  . abdominal mesh inserted  june 2006, may 2008,dec 2010  . CHOLECYSTECTOMY  june 2007  . COLON SURGERY  sept 2007  . COLONOSCOPY    . COLONOSCOPY N/A 02/20/2014   Procedure: COLONOSCOPY;  Surgeon: Sandi L Fields, MD;  [facility];  Service: Endoscopy;  Laterality: N/A;  . HERNIA REPAIR  05/2005 5/20108, 11/2009   with mesh  . KIDNEY CYST REMOVAL  jan 2005   renal cell carcinoma  . knee minescus repair Left feb 2007  . NEPHRECTOMY Left 12/2003   Social History   Occupational History    Comment: retired  Tobacco Use  . Smoking status: Former Smoker    Last attempt to quit: 12/06/1996    Years since quitting: 22.1  . Smokeless tobacco: Never Used  Substance and Sexual Activity  . Alcohol use: Yes    Comment: a few shots a few times a month  . Drug use: No  . Sexual activity: Not on file     Peter W Whitfield, MD   Note - This record has been created using Dragon software.  Chart creation errors have been sought, but may not  always  have been located. Such creation errors do not reflect on  the standard of medical care.  

## 2019-02-07 NOTE — Patient Instructions (Signed)
[place] at [facility] Discharge Instructions  You were seen today by Dr. Delton Coombes, he reviewed labs. He discussed your recent hair loss. He examined you and went over how you've been feeling lately. He wants you to continue taking the Bactrim and Prednisone at the same doses. We will see you back in 4 weeks with labs and follow up.   Thank you for choosing [facility] at [facility] to provide your oncology and hematology care.  To afford each patient quality time with our provider, please arrive at least 15 minutes before your scheduled appointment time.   If you have a lab appointment with the [facility] please come in thru the  Main Entrance and check in at the main information desk  You need to re-schedule your appointment should you arrive 10 or more minutes late.  We strive to give you quality time with our providers, and arriving late affects you and other patients whose appointments are after yours.  Also, if you no show three or more times for appointments you may be dismissed from the clinic at the providers discretion.     Again, thank you for choosing [facility].  Our hope is that these requests will decrease the amount of time that you wait before being seen by our physicians.       _____________________________________________________________  Should you have questions after your visit to [facility], please contact our office at [phone] between the hours of 8:00 a.m. and 4:30 p.m.  Voicemails left after 4:00 p.m. will not be returned until the following business day.  For prescription refill requests, have your pharmacy contact our office and allow 72 hours.    Cancer Center Support Programs:   > Cancer Support Group  2nd Tuesday of the month 1pm-2pm, Journey Room

## 2019-02-07 NOTE — Progress Notes (Deleted)
 Office Visit Note   Patient: [name] Ball           Date of Birth: 04/24/1951           MRN: [id number] Visit Date: 02/07/2019              Requested by: [place], Kawanta F, MD [address] [place], [place] [address] PCP: [place], Kawanta F, MD   Assessment & Plan: Visit Diagnoses: No diagnosis found.  Plan: ***  Follow-Up Instructions: No follow-ups on file.   Orders:  No orders of the defined types were placed in this encounter.  No orders of the defined types were placed in this encounter.     Procedures: No procedures performed   Clinical Data: No additional findings.   Subjective: Chief Complaint  Patient presents with  . Right Knee - Pain  Patient presents today for right knee pain. He had a cortisone injection last on 11/22/18. Patient states that it has been hurting again for 3 weeks. He is wanting another one today.   HPI  Review of Systems   Objective: Vital Signs: There were no vitals taken for this visit.  Physical Exam  Ortho Exam  Specialty Comments:  No specialty comments available.  Imaging: No results found.   PMFS History: Patient Active Problem List   Diagnosis Date Noted  . Chronic pain of right knee 11/22/2018  . History of CVA (cerebrovascular accident) 03/18/2017  . Complicated migraine 03/09/2017  . Asthma 03/09/2017  . Hypogammaglobulinemia (HCC) 01/27/2016  . CLL (chronic lymphocytic leukemia) (HCC) 11/10/2015  . Prediabetes 04/08/2015  . Depression 12/10/2014  . Low testosterone 12/10/2014  . Essential hypertension, benign 08/09/2014  . Sleep apnea 08/09/2014  . Onychomycosis 08/09/2014  . Diverticulosis of colon with hemorrhage 02/20/2014  . GI bleed 02/18/2014  . Recurrent ventral incisional hernia 01/21/2014  . Abdominal wall hernia 11/19/2013  . Hand dermatitis 09/18/2013  . Hypothyroidism 07/17/2013  . CKD (chronic kidney disease), stage III (HCC) 07/17/2013  . Obesity 07/17/2013  . Chronic pain  syndrome 07/17/2013  . COPD with asthma (HCC) 07/17/2013  . GERD (gastroesophageal reflux disease) 07/17/2013   Past Medical History:  Diagnosis Date  . Allergy   . Arthritis   . Asthma   . Cancer (HCC)    kidney  . CLL (chronic lymphocytic leukemia) (HCC) 2016  . COPD (chronic obstructive pulmonary disease) (HCC)   . Diverticulitis   . Diverticulosis    colonoscopy 3/15  . GERD (gastroesophageal reflux disease)   . Hypertension   . Low testosterone   . Migraine   . Shingles 03/31/2016  . Solar purpura (HCC)   . Thyroid disease    hypothryoid  . Ulcer   . Ventral hernia FEB 2015 CT    Large infra umbilical ventral hernia containing loops of small    Family History  Problem Relation Age of Onset  . Cancer Mother        lung, met to brain  . Diabetes Mellitus II Sister   . Colon cancer Neg Hx     Past Surgical History:  Procedure Laterality Date  . abdominal mesh inserted  june 2006, may 2008,dec 2010  . CHOLECYSTECTOMY  june 2007  . COLON SURGERY  sept 2007  . COLONOSCOPY    . COLONOSCOPY N/A 02/20/2014   Procedure: COLONOSCOPY;  Surgeon: Sandi L Fields, MD;  [facility];  Service: Endoscopy;  Laterality: N/A;  . HERNIA REPAIR  05/2005 5/20108,   11/2009   with mesh  . KIDNEY CYST REMOVAL  jan 2005   renal cell carcinoma  . knee minescus repair Left feb 2007  . NEPHRECTOMY Left 12/2003   Social History   Occupational History    Comment: retired  Tobacco Use  . Smoking status: Former Smoker    Last attempt to quit: 12/06/1996    Years since quitting: 22.1  . Smokeless tobacco: Never Used  Substance and Sexual Activity  . Alcohol use: Yes    Comment: a few shots a few times a month  . Drug use: No  . Sexual activity: Not on file

## 2019-02-08 ENCOUNTER — Ambulatory Visit (INDEPENDENT_AMBULATORY_CARE_PROVIDER_SITE_OTHER): Payer: Medicare Other | Admitting: Orthopaedic Surgery

## 2019-02-20 ENCOUNTER — Other Ambulatory Visit: Payer: Self-pay | Admitting: Family Medicine

## 2019-02-21 DIAGNOSIS — G4733 Obstructive sleep apnea (adult) (pediatric): Secondary | ICD-10-CM | POA: Diagnosis not present

## 2019-02-26 ENCOUNTER — Encounter (HOSPITAL_COMMUNITY): Payer: Self-pay

## 2019-02-26 ENCOUNTER — Other Ambulatory Visit: Payer: Self-pay | Admitting: Family Medicine

## 2019-02-26 MED ORDER — HYDROCODONE-ACETAMINOPHEN 10-325 MG PO TABS: 1.0000 | ORAL_TABLET | Freq: Three times a day (TID) | ORAL | 0 refills | Status: DC | PRN

## 2019-02-26 NOTE — Telephone Encounter (Signed)
Ok to refill??  Last office visit 01/12/2019.  Last refill 01/30/2019.

## 2019-03-01 ENCOUNTER — Inpatient Hospital Stay (HOSPITAL_COMMUNITY): Payer: Medicare Other

## 2019-03-01 ENCOUNTER — Other Ambulatory Visit: Payer: Self-pay

## 2019-03-01 DIAGNOSIS — D591 Other autoimmune hemolytic anemias: Secondary | ICD-10-CM | POA: Diagnosis not present

## 2019-03-01 DIAGNOSIS — N183 Chronic kidney disease, stage 3 (moderate): Secondary | ICD-10-CM | POA: Diagnosis not present

## 2019-03-01 DIAGNOSIS — C9110 Chronic lymphocytic leukemia of B-cell type not having achieved remission: Secondary | ICD-10-CM | POA: Diagnosis not present

## 2019-03-01 DIAGNOSIS — D801 Nonfamilial hypogammaglobulinemia: Secondary | ICD-10-CM | POA: Diagnosis not present

## 2019-03-01 LAB — CBC WITH DIFFERENTIAL/PLATELET
Abs Immature Granulocytes: 0.29 10*3/uL — ABNORMAL HIGH (ref 0.00–0.07)
Basophils Absolute: 0.2 10*3/uL — ABNORMAL HIGH (ref 0.0–0.1)
Basophils Relative: 0 %
Eosinophils Absolute: 0.3 10*3/uL (ref 0.0–0.5)
Eosinophils Relative: 1 %
HCT: 38.8 % — ABNORMAL LOW (ref 39.0–52.0)
Hemoglobin: 11.8 g/dL — ABNORMAL LOW (ref 13.0–17.0)
IMMATURE GRANULOCYTES: 1 %
Lymphocytes Relative: 76 %
Lymphs Abs: 37.9 10*3/uL — ABNORMAL HIGH (ref 0.7–4.0)
MCH: 29.2 pg (ref 26.0–34.0)
MCHC: 30.4 g/dL (ref 30.0–36.0)
MCV: 96 fL (ref 80.0–100.0)
Monocytes Absolute: 8.2 10*3/uL — ABNORMAL HIGH (ref 0.1–1.0)
Monocytes Relative: 17 %
NEUTROS ABS: 2.3 10*3/uL (ref 1.7–7.7)
Neutrophils Relative %: 5 %
Platelets: 124 10*3/uL — ABNORMAL LOW (ref 150–400)
RBC: 4.04 MIL/uL — ABNORMAL LOW (ref 4.22–5.81)
RDW: 14.2 % (ref 11.5–15.5)
WBC MORPHOLOGY: REACTIVE
WBC: 49.2 10*3/uL — ABNORMAL HIGH (ref 4.0–10.5)
nRBC: 0 % (ref 0.0–0.2)

## 2019-03-01 LAB — LACTATE DEHYDROGENASE: LDH: 180 U/L (ref 98–192)

## 2019-03-01 LAB — COMPREHENSIVE METABOLIC PANEL
ALT: 18 U/L (ref 0–44)
AST: 16 U/L (ref 15–41)
Albumin: 4.3 g/dL (ref 3.5–5.0)
Alkaline Phosphatase: 36 U/L — ABNORMAL LOW (ref 38–126)
Anion gap: 8 (ref 5–15)
BUN: 22 mg/dL (ref 8–23)
CO2: 24 mmol/L (ref 22–32)
Calcium: 9.1 mg/dL (ref 8.9–10.3)
Chloride: 107 mmol/L (ref 98–111)
Creatinine, Ser: 1.49 mg/dL — ABNORMAL HIGH (ref 0.61–1.24)
GFR calc Af Amer: 55 mL/min — ABNORMAL LOW (ref >60)
GFR, EST NON AFRICAN AMERICAN: 48 mL/min — AB (ref >60)
Glucose, Bld: 117 mg/dL — ABNORMAL HIGH (ref 70–99)
POTASSIUM: 4.5 mmol/L (ref 3.5–5.1)
Sodium: 139 mmol/L (ref 135–145)
Total Bilirubin: 0.3 mg/dL (ref 0.3–1.2)
Total Protein: 6 g/dL — ABNORMAL LOW (ref 6.5–8.1)

## 2019-03-01 LAB — RETICULOCYTES
IMMATURE RETIC FRACT: 17.5 % — AB (ref 2.3–15.9)
RBC.: 4.04 MIL/uL — AB (ref 4.22–5.81)
Retic Count, Absolute: 37.5 10*3/uL (ref 19.0–186.0)
Retic Ct Pct: 0.9 % (ref 0.4–3.1)

## 2019-03-05 ENCOUNTER — Encounter (HOSPITAL_COMMUNITY): Payer: Self-pay

## 2019-03-07 ENCOUNTER — Other Ambulatory Visit: Payer: Self-pay

## 2019-03-07 ENCOUNTER — Inpatient Hospital Stay (HOSPITAL_COMMUNITY): Payer: Medicare Other | Attending: Hematology | Admitting: Hematology

## 2019-03-07 ENCOUNTER — Encounter (HOSPITAL_COMMUNITY): Payer: Self-pay | Admitting: Hematology

## 2019-03-07 DIAGNOSIS — D630 Anemia in neoplastic disease: Secondary | ICD-10-CM | POA: Diagnosis not present

## 2019-03-07 DIAGNOSIS — N189 Chronic kidney disease, unspecified: Secondary | ICD-10-CM

## 2019-03-07 DIAGNOSIS — Z7952 Long term (current) use of systemic steroids: Secondary | ICD-10-CM | POA: Diagnosis not present

## 2019-03-07 DIAGNOSIS — C9110 Chronic lymphocytic leukemia of B-cell type not having achieved remission: Secondary | ICD-10-CM

## 2019-03-07 NOTE — Progress Notes (Signed)
Virtual Visit via Telephone Note  I connected with [name] Ball on 03/07/19 at 10:30 AM EDT by telephone and verified that I am speaking with the correct person using two identifiers.   I discussed the limitations, risks, security and privacy concerns of performing an evaluation and management service by telephone and the availability of in person appointments. I also discussed with the patient that there may be a patient responsible charge related to this service. The patient expressed understanding and agreed to proceed.   History of Present Illness: #1 autoimmune hemolytic anemia secondary to CLL. -Currently on prednisone 5 mg daily. 2.  Hypogammaglobulinemia 3.  CKD   Observations/Objective: I have talked to his wife on the phone as he is hearing impaired.  He did not experience any fevers, night sweats or weight loss in the last 1 month.  Denied any visits to the ER or hospitalization.  Denies any bleeding per rectum or melena.  Denies any other infections.  Assessment and Plan: -I have reviewed blood work with the patient's wife.  Hemoglobin is stable around 11.8.  LDH was normal.  Platelets are mildly decreased at 124.  White count is stable at 49. -Hence have recommended decreasing prednisone to 5 mg every other day. -I will reevaluate him in 4 weeks.   Follow Up Instructions: -RTC 4 weeks with labs.    I discussed the assessment and treatment plan with the patient. The patient was provided an opportunity to ask questions and all were answered. The patient agreed with the plan and demonstrated an understanding of the instructions.   The patient was advised to call back or seek an in-person evaluation if the symptoms worsen or if the condition fails to improve as anticipated.  I provided 10 minutes of non-face-to-face time during this encounter.   Derek Jack, MD

## 2019-03-27 ENCOUNTER — Encounter: Payer: Self-pay | Admitting: Family Medicine

## 2019-03-27 ENCOUNTER — Other Ambulatory Visit: Payer: Self-pay | Admitting: Family Medicine

## 2019-03-27 MED ORDER — HYDROCODONE-ACETAMINOPHEN 10-325 MG PO TABS: 1.0000 | ORAL_TABLET | Freq: Three times a day (TID) | ORAL | 0 refills | Status: DC | PRN

## 2019-03-27 NOTE — Telephone Encounter (Signed)
Ok to refill??  Last office visit 01/12/2019.  Last refill 02/26/2019.

## 2019-04-10 ENCOUNTER — Other Ambulatory Visit: Payer: Self-pay | Admitting: Family Medicine

## 2019-04-12 ENCOUNTER — Encounter (HOSPITAL_COMMUNITY): Payer: Self-pay | Admitting: Hematology

## 2019-04-12 ENCOUNTER — Other Ambulatory Visit: Payer: Self-pay

## 2019-04-12 ENCOUNTER — Inpatient Hospital Stay (HOSPITAL_COMMUNITY): Payer: Medicare Other

## 2019-04-12 ENCOUNTER — Inpatient Hospital Stay (HOSPITAL_COMMUNITY): Payer: Medicare Other | Attending: Hematology | Admitting: Hematology

## 2019-04-12 DIAGNOSIS — Z87891 Personal history of nicotine dependence: Secondary | ICD-10-CM | POA: Diagnosis not present

## 2019-04-12 DIAGNOSIS — C9110 Chronic lymphocytic leukemia of B-cell type not having achieved remission: Secondary | ICD-10-CM

## 2019-04-12 DIAGNOSIS — Z79899 Other long term (current) drug therapy: Secondary | ICD-10-CM | POA: Insufficient documentation

## 2019-04-12 DIAGNOSIS — D591 Other autoimmune hemolytic anemias: Secondary | ICD-10-CM | POA: Diagnosis not present

## 2019-04-12 DIAGNOSIS — E039 Hypothyroidism, unspecified: Secondary | ICD-10-CM | POA: Diagnosis not present

## 2019-04-12 DIAGNOSIS — I10 Essential (primary) hypertension: Secondary | ICD-10-CM | POA: Insufficient documentation

## 2019-04-12 LAB — CBC WITH DIFFERENTIAL/PLATELET
Abs Immature Granulocytes: 0.14 10*3/uL — ABNORMAL HIGH (ref 0.00–0.07)
Basophils Absolute: 0.4 10*3/uL — ABNORMAL HIGH (ref 0.0–0.1)
Basophils Relative: 0 %
Eosinophils Absolute: 0.4 10*3/uL (ref 0.0–0.5)
Eosinophils Relative: 0 %
HCT: 36.8 % — ABNORMAL LOW (ref 39.0–52.0)
Hemoglobin: 11 g/dL — ABNORMAL LOW (ref 13.0–17.0)
Immature Granulocytes: 0 %
Lymphocytes Relative: 73 %
Lymphs Abs: 87 10*3/uL — ABNORMAL HIGH (ref 0.7–4.0)
MCH: 29.2 pg (ref 26.0–34.0)
MCHC: 29.9 g/dL — ABNORMAL LOW (ref 30.0–36.0)
MCV: 97.6 fL (ref 80.0–100.0)
Monocytes Absolute: 29.9 10*3/uL — ABNORMAL HIGH (ref 0.1–1.0)
Monocytes Relative: 25 %
Neutro Abs: 1.9 10*3/uL (ref 1.7–7.7)
Neutrophils Relative %: 2 %
Platelets: 203 10*3/uL (ref 150–400)
RBC: 3.77 MIL/uL — ABNORMAL LOW (ref 4.22–5.81)
RDW: 14.6 % (ref 11.5–15.5)
WBC: 119.8 10*3/uL (ref 4.0–10.5)
nRBC: 0 % (ref 0.0–0.2)

## 2019-04-12 LAB — RETICULOCYTES
Immature Retic Fract: 23 % — ABNORMAL HIGH (ref 2.3–15.9)
RBC.: 3.77 MIL/uL — ABNORMAL LOW (ref 4.22–5.81)
Retic Count, Absolute: 41.4 10*3/uL (ref 19.0–186.0)
Retic Ct Pct: 1.1 % (ref 0.4–3.1)

## 2019-04-12 LAB — COMPREHENSIVE METABOLIC PANEL
ALT: 17 U/L (ref 0–44)
AST: 22 U/L (ref 15–41)
Albumin: 4.2 g/dL (ref 3.5–5.0)
Alkaline Phosphatase: 43 U/L (ref 38–126)
Anion gap: 8 (ref 5–15)
BUN: 25 mg/dL — ABNORMAL HIGH (ref 8–23)
CO2: 22 mmol/L (ref 22–32)
Calcium: 9.3 mg/dL (ref 8.9–10.3)
Chloride: 110 mmol/L (ref 98–111)
Creatinine, Ser: 1.77 mg/dL — ABNORMAL HIGH (ref 0.61–1.24)
GFR calc Af Amer: 45 mL/min — ABNORMAL LOW (ref >60)
GFR calc non Af Amer: 39 mL/min — ABNORMAL LOW (ref >60)
Glucose, Bld: 134 mg/dL — ABNORMAL HIGH (ref 70–99)
Potassium: 4.7 mmol/L (ref 3.5–5.1)
Sodium: 140 mmol/L (ref 135–145)
Total Bilirubin: 0.6 mg/dL (ref 0.3–1.2)
Total Protein: 6.3 g/dL — ABNORMAL LOW (ref 6.5–8.1)

## 2019-04-12 LAB — LACTATE DEHYDROGENASE: LDH: 187 U/L (ref 98–192)

## 2019-04-12 NOTE — Progress Notes (Signed)
CRITICAL VALUE ALERT  Critical Value:  WBC 119.8  Date & Time Notied:  04/12/2019 at 1012  Provider Notified: Dr. Delton Coombes  Orders Received/Actions taken: n/a

## 2019-04-12 NOTE — Assessment & Plan Note (Signed)
1.  Autoimmune hemolytic anemia: -He developed autoimmune hemolytic anemia as a complication of CLL in June 2019. -Prednisone 1 mg/kg (100 mg daily) started on 05/19/2018, gradually being tapered off. -He was also started on Procrit at that time as he is a Jehovah's Witness.  -His most recent prednisone taper was on 03/07/2019, prednisone changed to 5 mg every other day. - He is tolerating the dose very well.  He is able to lose some weight. -We reviewed his blood work today.  Hemoglobin decreased to 11 today.  White count also increased to 119.  However he reports having cold infection last week and using over-the-counter cold medicine.  LDH and reticulocyte count was normal. -I did not make any changes today.  He will continue the same dose.  I will reevaluate him in 4 weeks. - I will consider repeating Coombs test at some point.  He will also continue folic acid supplements daily.  2.  Hypogammaglobulinemia: -His IgG level is in the 400 range.  However he does not have any recurrent infections requiring antibiotics. -Hence we will continue to monitor him.  He was treated with IVIG in February 2017, which was discontinued secondary to high cost.   3.  Stage III CKD: -He had left nephrectomy for RCC in 2005. -His creatinine between 1.4 and 1.7.  4.  ID prophylaxis: -We will continue Bactrim DS 1 tablet 3 times a week as long as he is on prednisone.

## 2019-04-12 NOTE — Progress Notes (Signed)
[place], [place] [address]   CLINIC:  Medical Oncology/Hematology  PCP:  Alycia Rossetti, MD [address] [place] [address] [phone]   REASON FOR VISIT:  Follow-up for CLL and AIHA.    INTERVAL HISTORY:  [name] Ball 68 y.o. male returns for routine follow-up. He is here today with his wife. He states that he has been feeling well since his last visit. He has had a recent allergy/cold/sinusinfection, just took over the counter medication. He states that he has had a little weight loss. Denies any nausea, vomiting, or diarrhea. Denies any new pains. Had not noticed any recent bleeding such as epistaxis, hematuria or hematochezia. Denies recent chest pain on exertion, shortness of breath on minimal exertion, pre-syncopal episodes, or palpitations. Denies any numbness or tingling in hands or feet. Denies any recent fevers, infections, or recent hospitalizations. Patient reports appetite at 100% and energy level at 25%.     REVIEW OF SYSTEMS:  Review of Systems  Respiratory: Positive for shortness of breath.   Gastrointestinal: Positive for diarrhea.  Neurological: Positive for dizziness and headaches.     PAST MEDICAL/SURGICAL HISTORY:  Past Medical History:  Diagnosis Date  . Allergy   . Arthritis   . Asthma   . Cancer ([place])    kidney  . CLL (chronic lymphocytic leukemia) ([facility]) 2016  . COPD (chronic obstructive pulmonary disease) ([facility])   . Diverticulitis   . Diverticulosis    colonoscopy 3/15  . GERD (gastroesophageal reflux disease)   . Hypertension   . Low testosterone   . Migraine   . Shingles 03/31/2016  . Solar purpura ([facility])   . Thyroid disease    hypothryoid  . Ulcer   . Ventral hernia FEB 2015 CT    Large infra umbilical ventral hernia containing loops of small   Past Surgical History:  Procedure Laterality Date  . abdominal mesh inserted  june 2006, may 2008,dec 2010  . CHOLECYSTECTOMY  june 2007   . COLON SURGERY  sept 2007  . COLONOSCOPY    . COLONOSCOPY N/A 02/20/2014   Procedure: COLONOSCOPY;  Surgeon: Danie Binder, MD;  Location: [facility];  Service: Endoscopy;  Laterality: N/A;  . HERNIA REPAIR  05/2005 5/20108, 11/2009   with mesh  . KIDNEY CYST REMOVAL  jan 2005   renal cell carcinoma  . knee minescus repair Left feb 2007  . NEPHRECTOMY Left 12/2003     SOCIAL HISTORY:  Social History   Socioeconomic History  . Marital status: Married    Spouse name: [name] Ball  . Number of children: 4  . Years of education: 2  . Highest education level: Not on file  Occupational History    Comment: retired  Scientific laboratory technician  . Financial resource strain: Not on file  . Food insecurity:    Worry: Not on file    Inability: Not on file  . Transportation needs:    Medical: Not on file    Non-medical: Not on file  Tobacco Use  . Smoking status: Former Smoker    Last attempt to quit: 12/06/1996    Years since quitting: 22.3  . Smokeless tobacco: Never Used  Substance and Sexual Activity  . Alcohol use: Yes    Comment: a few shots a few times a month  . Drug use: No  . Sexual activity: Not on file  Lifestyle  . Physical activity:    Days per week:  Not on file    Minutes per session: Not on file  . Stress: Not on file  Relationships  . Social connections:    Talks on phone: Not on file    Gets together: Not on file    Attends religious service: Not on file    Active member of club or organization: Not on file    Attends meetings of clubs or organizations: Not on file    Relationship status: Not on file  . Intimate partner violence:    Fear of current or ex partner: Not on file    Emotionally abused: Not on file    Physically abused: Not on file    Forced sexual activity: Not on file  Other Topics Concern  . 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   Lives at home with wife, daughter, family   Caffeine- sodas, 2  daily    FAMILY HISTORY:  Family History  Problem Relation Age of Onset  . Cancer Mother        lung, met to brain  . Diabetes Mellitus II Sister   . Colon cancer Neg Hx     CURRENT MEDICATIONS:  Outpatient Encounter Medications as of 04/12/2019  Medication Sig  . acetaminophen (TYLENOL) 325 MG tablet Take 650 mg by mouth every 6 (six) hours as needed.  Marland Kitchen albuterol (PROVENTIL) (2.5 MG/3ML) 0.083% nebulizer solution Take 3 mLs (2.5 mg total) by nebulization every 2 (two) hours as needed for wheezing or shortness of breath.  . allopurinol (ZYLOPRIM) 100 MG tablet TAKE 1 TABLET(100 MG) BY MOUTH AT BEDTIME (Patient taking differently: Take 100 mg by mouth every evening. )  . amLODipine (NORVASC) 10 MG tablet TAKE 1 TABLET BY MOUTH DAILY  . ATROVENT HFA 17 MCG/ACT inhaler INHALE 2 PUFFS BY MOUTH INTO THE LUNGS EVERY 6 HOURS (Patient taking differently: Inhale 2 puffs into the lungs every 6 (six) hours as needed for wheezing. )  . calcium carbonate (TUMS - DOSED IN MG ELEMENTAL CALCIUM) 500 MG chewable tablet Chew 1,000 mg by mouth at bedtime. Reported on 11/21/2015  . cholecalciferol (VITAMIN D3) 25 MCG (1000 UT) tablet Take 1,000 Units by mouth every morning.  . cimetidine (TAGAMET) 200 MG tablet Take 200 mg by mouth at bedtime.  . citalopram (CELEXA) 10 MG tablet TAKE 1 TABLET(10 MG) BY MOUTH DAILY  . clopidogrel (PLAVIX) 75 MG tablet TAKE 1 TABLET(75 MG) BY MOUTH DAILY  . diphenhydrAMINE (BENADRYL) 25 MG tablet Take 25 mg by mouth daily as needed for allergies.   . folic acid (FOLVITE) 1 MG tablet Take 1 tablet (1 mg total) by mouth daily.  . Glucosamine-Chondroit-Vit C-Mn (GLUCOSAMINE 1500 COMPLEX) CAPS Take 1,200 capsules by mouth 2 (two) times daily.  Marland Kitchen HYDROcodone-acetaminophen (NORCO) 10-325 MG tablet Take 1-2 tablets by mouth every 8 (eight) hours as needed.  Marland Kitchen levothyroxine (SYNTHROID, LEVOTHROID) 50 MCG tablet TAKE 1 TABLET(50 MCG) BY MOUTH DAILY BEFORE BREAKFAST  . metoprolol  tartrate (LOPRESSOR) 50 MG tablet TAKE 1 TABLET(50 MG) BY MOUTH TWICE DAILY  . mometasone (ASMANEX 60 METERED DOSES) 220 MCG/INH inhaler Inhale 2 puffs into the lungs 2 (two) times daily.  . Multiple Vitamin (MULTIVITAMIN WITH MINERALS) TABS tablet Take 1 tablet by mouth daily.  Marland Kitchen neomycin-bacitracin-polymyxin (NEOSPORIN) ointment Apply 1 application topically as needed for wound care.  . Omega-3 Fatty Acids (FISH OIL) 1000 MG CPDR Take 1 capsule by mouth 2 (two)  times daily.   Marland Kitchen sulfamethoxazole-trimethoprim (BACTRIM DS,SEPTRA DS) 800-160 MG tablet Take 1 tablet by mouth every Monday, Wednesday, and Friday.  . vitamin B-12 (CYANOCOBALAMIN) 1000 MCG tablet Take 1,000 mcg by mouth every morning.   . vitamin C (ASCORBIC ACID) 500 MG tablet Take 500 mg by mouth 2 (two) times daily.  . vitamin E 400 UNIT capsule Take 400 Units by mouth every morning.   . clobetasol cream (TEMOVATE) 0.05 % Apply topically 2 (two) times daily. (Patient not taking: Reported on 04/12/2019)  . prednisoLONE 5 MG TABS tablet Take 5 mg by mouth daily.   No facility-administered encounter medications on file as of 04/12/2019.     ALLERGIES:  Allergies  Allergen Reactions  . Contrast Media [Iodinated Diagnostic Agents]     Single functioning kidney  . Other Hives    Neoprene fabric Strawberry   . Ether Rash    Took a long time to wake up Doesn't wake up from it     PHYSICAL EXAM:  ECOG Performance status: 1  Vitals:   04/12/19 1005  BP: 117/75  Pulse: 66  Resp: 18  Temp: (!) 97.5 F (36.4 C)  SpO2: 94%   Filed Weights   04/12/19 1005  Weight: 266 lb 3.2 oz (120.7 kg)    Physical Exam Vitals signs reviewed.  Constitutional:      Appearance: Normal appearance.  Cardiovascular:     Rate and Rhythm: Normal rate and regular rhythm.     Heart sounds: Normal heart sounds.  Pulmonary:     Effort: Pulmonary effort is normal.     Breath sounds: Normal breath sounds.  Abdominal:     General: There is no  distension.     Palpations: Abdomen is soft.  Musculoskeletal:        General: No swelling.  Skin:    General: Skin is warm.  Neurological:     General: No focal deficit present.     Mental Status: He is alert and oriented to person, place, and time.  Psychiatric:        Mood and Affect: Mood normal.        Behavior: Behavior normal.      LABORATORY DATA:  I have reviewed the labs as listed.  CBC    Component Value Date/Time   WBC 119.8 (HH) 04/12/2019 0934   RBC 3.77 (L) 04/12/2019 0934   RBC 3.77 (L) 04/12/2019 0934   HGB 11.0 (L) 04/12/2019 0934   HCT 36.8 (L) 04/12/2019 0934   PLT 203 04/12/2019 0934   MCV 97.6 04/12/2019 0934   MCH 29.2 04/12/2019 0934   MCHC 29.9 (L) 04/12/2019 0934   RDW 14.6 04/12/2019 0934   LYMPHSABS 87.0 (H) 04/12/2019 0934   MONOABS 29.9 (H) 04/12/2019 0934   EOSABS 0.4 04/12/2019 0934   BASOSABS 0.4 (H) 04/12/2019 0934   CMP Latest Ref Rng & Units 04/12/2019 03/01/2019 02/05/2019  Glucose 70 - 99 mg/dL 134(H) 117(H) 136(H)  BUN 8 - 23 mg/dL 25(H) 22 23  Creatinine 0.61 - 1.24 mg/dL 1.77(H) 1.49(H) 1.65(H)  Sodium 135 - 145 mmol/L 140 139 138  Potassium 3.5 - 5.1 mmol/L 4.7 4.5 4.8  Chloride 98 - 111 mmol/L 110 107 105  CO2 22 - 32 mmol/L 22 24 24   Calcium 8.9 - 10.3 mg/dL 9.3 9.1 9.3  Total Protein 6.5 - 8.1 g/dL 6.3(L) 6.0(L) 6.0(L)  Total Bilirubin 0.3 - 1.2 mg/dL 0.6 0.3 0.5  Alkaline Phos 38 - 126 U/L 43  36(L) 34(L)  AST 15 - 41 U/L 22 16 20   ALT 0 - 44 U/L 17 18 19        DIAGNOSTIC IMAGING:  I have independently reviewed the scans and discussed with the patient.   I have reviewed [name] Ball's note and agree with the documentation.  I personally performed a face-to-face visit, made revisions and my assessment and plan is as follows.    ASSESSMENT & PLAN:   CLL (chronic lymphocytic leukemia) ([facility]) 1.  Autoimmune hemolytic anemia: -He developed autoimmune hemolytic anemia as a complication of CLL in June 2019.  -Prednisone 1 mg/kg (100 mg daily) started on 05/19/2018, gradually being tapered off. -He was also started on Procrit at that time as he is a Jehovah's Witness.  -His most recent prednisone taper was on 03/07/2019, prednisone changed to 5 mg every other day. - He is tolerating the dose very well.  He is able to lose some weight. -We reviewed his blood work today.  Hemoglobin decreased to 11 today.  White count also increased to 119.  However he reports having cold infection last week and using over-the-counter cold medicine.  LDH and reticulocyte count was normal. -I did not make any changes today.  He will continue the same dose.  I will reevaluate him in 4 weeks. - I will consider repeating Coombs test at some point.  He will also continue folic acid supplements daily.  2.  Hypogammaglobulinemia: -His IgG level is in the 400 range.  However he does not have any recurrent infections requiring antibiotics. -Hence we will continue to monitor him.  He was treated with IVIG in February 2017, which was discontinued secondary to high cost.   3.  Stage III CKD: -He had left nephrectomy for RCC in 2005. -His creatinine between 1.4 and 1.7.  4.  ID prophylaxis: -We will continue Bactrim DS 1 tablet 3 times a week as long as he is on prednisone.    Total time spent is 25 minutes with more than 50% of the time spent face-to-face discussing his diagnosis, management and coordination of care.  Orders placed this encounter:  No orders of the defined types were placed in this encounter.     Derek Jack, MD [place] [phone]

## 2019-04-12 NOTE — Patient Instructions (Signed)
[facility] at [facility] Discharge Instructions  You were seen today by Dr. Katragadda. He went over your recent lab results. He will see you back in 4 weeks for labs and follow up.   Thank you for choosing [facility] at [facility] to provide your oncology and hematology care.  To afford each patient quality time with our provider, please arrive at least 15 minutes before your scheduled appointment time.   If you have a lab appointment with the Cancer Center please come in thru the  Main Entrance and check in at the main information desk  You need to re-schedule your appointment should you arrive 10 or more minutes late.  We strive to give you quality time with our providers, and arriving late affects you and other patients whose appointments are after yours.  Also, if you no show three or more times for appointments you may be dismissed from the clinic at the providers discretion.     Again, thank you for choosing [facility].  Our hope is that these requests will decrease the amount of time that you wait before being seen by our physicians.       _____________________________________________________________  Should you have questions after your visit to [facility], please contact our office at [phone] between the hours of 8:00 a.m. and 4:30 p.m.  Voicemails left after 4:00 p.m. will not be returned until the following business day.  For prescription refill requests, have your pharmacy contact our office and allow 72 hours.    Cancer Center Support Programs:   > Cancer Support Group  2nd Tuesday of the month 1pm-2pm, Journey Room    

## 2019-04-13 LAB — HAPTOGLOBIN: Haptoglobin: 105 mg/dL (ref 32–363)

## 2019-04-25 ENCOUNTER — Other Ambulatory Visit: Payer: Self-pay | Admitting: Family Medicine

## 2019-04-26 ENCOUNTER — Telehealth: Payer: Self-pay | Admitting: Family Medicine

## 2019-04-26 NOTE — Telephone Encounter (Signed)
Patient is requesting a refill on Hydrocodone   LOV: 01/12/19  LRF:   03/27/19

## 2019-04-27 MED ORDER — CITALOPRAM HYDROBROMIDE 10 MG PO TABS: ORAL_TABLET | ORAL | 2 refills | Status: AC

## 2019-04-27 MED ORDER — HYDROCODONE-ACETAMINOPHEN 10-325 MG PO TABS: 1.0000 | ORAL_TABLET | Freq: Three times a day (TID) | ORAL | 0 refills | Status: DC | PRN

## 2019-04-27 NOTE — Telephone Encounter (Signed)
Requested Prescriptions   Pending Prescriptions Disp Refills  . citalopram (CELEXA) 10 MG tablet [Pharmacy Med Name: CITALOPRAM 10MG  TABLETS] 90 tablet 0    Sig: TAKE 1 TABLET(10 MG) BY MOUTH DAILY   Last OV 01/12/2019  Rx not on pt's med list. It is marked as not taking.

## 2019-05-11 ENCOUNTER — Other Ambulatory Visit (HOSPITAL_COMMUNITY): Payer: Self-pay | Admitting: Emergency Medicine

## 2019-05-11 DIAGNOSIS — C9110 Chronic lymphocytic leukemia of B-cell type not having achieved remission: Secondary | ICD-10-CM

## 2019-05-14 ENCOUNTER — Inpatient Hospital Stay (HOSPITAL_COMMUNITY): Payer: Medicare Other | Attending: Hematology

## 2019-05-14 ENCOUNTER — Other Ambulatory Visit: Payer: Self-pay

## 2019-05-14 DIAGNOSIS — C9110 Chronic lymphocytic leukemia of B-cell type not having achieved remission: Secondary | ICD-10-CM | POA: Diagnosis not present

## 2019-05-14 DIAGNOSIS — Z905 Acquired absence of kidney: Secondary | ICD-10-CM | POA: Diagnosis not present

## 2019-05-14 DIAGNOSIS — N183 Chronic kidney disease, stage 3 (moderate): Secondary | ICD-10-CM | POA: Insufficient documentation

## 2019-05-14 DIAGNOSIS — D591 Other autoimmune hemolytic anemias: Secondary | ICD-10-CM | POA: Insufficient documentation

## 2019-05-14 DIAGNOSIS — D801 Nonfamilial hypogammaglobulinemia: Secondary | ICD-10-CM | POA: Insufficient documentation

## 2019-05-14 DIAGNOSIS — R197 Diarrhea, unspecified: Secondary | ICD-10-CM | POA: Diagnosis not present

## 2019-05-14 LAB — CBC WITH DIFFERENTIAL/PLATELET
Abs Immature Granulocytes: 0.3 10*3/uL — ABNORMAL HIGH (ref 0.00–0.07)
Basophils Absolute: 0.3 10*3/uL — ABNORMAL HIGH (ref 0.0–0.1)
Basophils Relative: 0 %
Eosinophils Absolute: 0.5 10*3/uL (ref 0.0–0.5)
Eosinophils Relative: 0 %
HCT: 33.6 % — ABNORMAL LOW (ref 39.0–52.0)
Hemoglobin: 9.9 g/dL — ABNORMAL LOW (ref 13.0–17.0)
Immature Granulocytes: 0 %
Lymphocytes Relative: 70 %
Lymphs Abs: 99.9 10*3/uL — ABNORMAL HIGH (ref 0.7–4.0)
MCH: 29.5 pg (ref 26.0–34.0)
MCHC: 29.5 g/dL — ABNORMAL LOW (ref 30.0–36.0)
MCV: 100 fL (ref 80.0–100.0)
Monocytes Absolute: 39.3 10*3/uL — ABNORMAL HIGH (ref 0.1–1.0)
Monocytes Relative: 27 %
Neutro Abs: 4.4 10*3/uL (ref 1.7–7.7)
Neutrophils Relative %: 3 %
Platelets: 188 10*3/uL (ref 150–400)
RBC: 3.36 MIL/uL — ABNORMAL LOW (ref 4.22–5.81)
RDW: 15.2 % (ref 11.5–15.5)
WBC: 144.7 10*3/uL (ref 4.0–10.5)
nRBC: 0 % (ref 0.0–0.2)

## 2019-05-14 LAB — COMPREHENSIVE METABOLIC PANEL
ALT: 17 U/L (ref 0–44)
AST: 20 U/L (ref 15–41)
Albumin: 4.1 g/dL (ref 3.5–5.0)
Alkaline Phosphatase: 48 U/L (ref 38–126)
Anion gap: 7 (ref 5–15)
BUN: 23 mg/dL (ref 8–23)
CO2: 23 mmol/L (ref 22–32)
Calcium: 8.9 mg/dL (ref 8.9–10.3)
Chloride: 106 mmol/L (ref 98–111)
Creatinine, Ser: 1.52 mg/dL — ABNORMAL HIGH (ref 0.61–1.24)
GFR calc Af Amer: 54 mL/min — ABNORMAL LOW (ref >60)
GFR calc non Af Amer: 46 mL/min — ABNORMAL LOW (ref >60)
Glucose, Bld: 124 mg/dL — ABNORMAL HIGH (ref 70–99)
Potassium: 4.6 mmol/L (ref 3.5–5.1)
Sodium: 136 mmol/L (ref 135–145)
Total Bilirubin: 0.8 mg/dL (ref 0.3–1.2)
Total Protein: 6.1 g/dL — ABNORMAL LOW (ref 6.5–8.1)

## 2019-05-14 LAB — RETICULOCYTES
Immature Retic Fract: 21.6 % — ABNORMAL HIGH (ref 2.3–15.9)
RBC.: 3.36 MIL/uL — ABNORMAL LOW (ref 4.22–5.81)
Retic Count, Absolute: 56.6 10*3/uL (ref 19.0–186.0)
Retic Ct Pct: 1.7 % (ref 0.4–3.1)

## 2019-05-14 LAB — LACTATE DEHYDROGENASE: LDH: 186 U/L (ref 98–192)

## 2019-05-14 NOTE — Progress Notes (Unsigned)
CRITICAL VALUE ALERT  Critical Value:  WBC 144.7  Date & Time Notied:  05/14/2019 at 1357  Provider Notified: Dr. Delton Coombes  Orders Received/Actions taken: n/a

## 2019-05-16 ENCOUNTER — Other Ambulatory Visit: Payer: Self-pay

## 2019-05-16 ENCOUNTER — Other Ambulatory Visit (HOSPITAL_COMMUNITY): Payer: Medicare Other

## 2019-05-16 ENCOUNTER — Encounter (HOSPITAL_COMMUNITY): Payer: Self-pay | Admitting: Hematology

## 2019-05-16 ENCOUNTER — Inpatient Hospital Stay (HOSPITAL_BASED_OUTPATIENT_CLINIC_OR_DEPARTMENT_OTHER): Payer: Medicare Other | Admitting: Hematology

## 2019-05-16 VITALS — BP 139/59 | HR 85 | Temp 98.0°F | Resp 18 | Wt 265.5 lb

## 2019-05-16 DIAGNOSIS — Z905 Acquired absence of kidney: Secondary | ICD-10-CM | POA: Diagnosis not present

## 2019-05-16 DIAGNOSIS — D801 Nonfamilial hypogammaglobulinemia: Secondary | ICD-10-CM | POA: Diagnosis not present

## 2019-05-16 DIAGNOSIS — R197 Diarrhea, unspecified: Secondary | ICD-10-CM | POA: Diagnosis not present

## 2019-05-16 DIAGNOSIS — N183 Chronic kidney disease, stage 3 (moderate): Secondary | ICD-10-CM

## 2019-05-16 DIAGNOSIS — D591 Other autoimmune hemolytic anemias: Secondary | ICD-10-CM

## 2019-05-16 DIAGNOSIS — C9110 Chronic lymphocytic leukemia of B-cell type not having achieved remission: Secondary | ICD-10-CM | POA: Diagnosis not present

## 2019-05-16 NOTE — Progress Notes (Signed)
[name] Ball, [name] Ball [id number]   CLINIC:  Medical Oncology/Hematology  PCP:  Alycia Rossetti, MD [address] [place] [address] [phone]   REASON FOR VISIT:  Follow-up for CLL and AIHA.    INTERVAL HISTORY:  [name] Ball 68 y.o. male seen for follow-up of autoimmune hemolytic anemia and CLL.  Denies any fevers, night sweats or weight loss.  He has weight gain.  Complains of shortness of breath on exertion.  Had diarrhea for the past 1 month on and off.  Reportedly some of his family members also had diarrhea.  He also thinks it is coming from certain foods.  He is taking Bactrim 3 times a weeks.  He is taking prednisone 5 mg every other day.  Denies any hospitalizations or ER visits.   REVIEW OF SYSTEMS:  Review of Systems  Respiratory: Positive for shortness of breath.   Gastrointestinal: Positive for diarrhea.  Neurological: Negative for dizziness and headaches.  All other systems reviewed and are negative.    PAST MEDICAL/SURGICAL HISTORY:  Past Medical History:  Diagnosis Date  . Allergy   . Arthritis   . Asthma   . Cancer ([place])    kidney  . CLL (chronic lymphocytic leukemia) ([place]) 2016  . COPD (chronic obstructive pulmonary disease) ([place])   . Diverticulitis   . Diverticulosis    colonoscopy 3/15  . GERD (gastroesophageal reflux disease)   . Hypertension   . Low testosterone   . Migraine   . Shingles 03/31/2016  . Solar purpura ([place])   . Thyroid disease    hypothryoid  . Ulcer   . Ventral hernia FEB 2015 CT    Large infra umbilical ventral hernia containing loops of small   Past Surgical History:  Procedure Laterality Date  . abdominal mesh inserted  june 2006, may 2008,dec 2010  . CHOLECYSTECTOMY  june 2007  . COLON SURGERY  sept 2007  . COLONOSCOPY    . COLONOSCOPY N/A 02/20/2014   Procedure: COLONOSCOPY;  Surgeon: Danie Binder, MD;  Location: [facility];  Service: Endoscopy;  Laterality: N/A;   . HERNIA REPAIR  05/2005 5/20108, 11/2009   with mesh  . KIDNEY CYST REMOVAL  jan 2005   renal cell carcinoma  . knee minescus repair Left feb 2007  . NEPHRECTOMY Left 12/2003     SOCIAL HISTORY:  Social History   Socioeconomic History  . Marital status: Married    Spouse name: Zigmund Daniel  . Number of children: 4  . Years of education: 1  . Highest education level: Not on file  Occupational History    Comment: retired  Scientific laboratory technician  . Financial resource strain: Not on file  . Food insecurity:    Worry: Not on file    Inability: Not on file  . Transportation needs:    Medical: Not on file    Non-medical: Not on file  Tobacco Use  . Smoking status: Former Smoker    Last attempt to quit: 12/06/1996    Years since quitting: 22.4  . Smokeless tobacco: Never Used  Substance and Sexual Activity  . Alcohol use: Yes    Comment: a few shots a few times a month  . Drug use: No  . Sexual activity: Not on file  Lifestyle  . Physical activity:    Days per week: Not on file    Minutes per session: Not on file  . Stress: Not on  file  Relationships  . Social connections:    Talks on phone: Not on file    Gets together: Not on file    Attends religious service: Not on file    Active member of club or organization: Not on file    Attends meetings of clubs or organizations: Not on file    Relationship status: Not on file  . Intimate partner violence:    Fear of current or ex partner: Not on file    Emotionally abused: Not on file    Physically abused: Not on file    Forced sexual activity: Not on file  Other Topics Concern  . 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   Lives at home with wife, daughter, family   Caffeine- sodas, 2 daily    FAMILY HISTORY:  Family History  Problem Relation Age of Onset  . Cancer Mother        lung, met to brain  . Diabetes Mellitus II Sister   . Colon cancer Neg Hx     CURRENT  MEDICATIONS:  Outpatient Encounter Medications as of 05/16/2019  Medication Sig  . acetaminophen (TYLENOL) 325 MG tablet Take 650 mg by mouth every 6 (six) hours as needed.  Marland Kitchen albuterol (PROVENTIL) (2.5 MG/3ML) 0.083% nebulizer solution Take 3 mLs (2.5 mg total) by nebulization every 2 (two) hours as needed for wheezing or shortness of breath.  . allopurinol (ZYLOPRIM) 100 MG tablet TAKE 1 TABLET(100 MG) BY MOUTH AT BEDTIME (Patient taking differently: Take 100 mg by mouth every evening. )  . amLODipine (NORVASC) 10 MG tablet TAKE 1 TABLET BY MOUTH DAILY  . ATROVENT HFA 17 MCG/ACT inhaler INHALE 2 PUFFS BY MOUTH INTO THE LUNGS EVERY 6 HOURS (Patient taking differently: Inhale 2 puffs into the lungs every 6 (six) hours as needed for wheezing. )  . calcium carbonate (TUMS - DOSED IN MG ELEMENTAL CALCIUM) 500 MG chewable tablet Chew 1,000 mg by mouth at bedtime. Reported on 11/21/2015  . cholecalciferol (VITAMIN D3) 25 MCG (1000 UT) tablet Take 1,000 Units by mouth every morning.  . cimetidine (TAGAMET) 200 MG tablet Take 200 mg by mouth at bedtime.  . citalopram (CELEXA) 10 MG tablet TAKE 1 TABLET(10 MG) BY MOUTH DAILY  . clopidogrel (PLAVIX) 75 MG tablet TAKE 1 TABLET(75 MG) BY MOUTH DAILY  . diphenhydrAMINE (BENADRYL) 25 MG tablet Take 25 mg by mouth daily as needed for allergies.   . folic acid (FOLVITE) 1 MG tablet Take 1 tablet (1 mg total) by mouth daily.  . Glucosamine-Chondroit-Vit C-Mn (GLUCOSAMINE 1500 COMPLEX) CAPS Take 1,200 capsules by mouth 2 (two) times daily.  Marland Kitchen HYDROcodone-acetaminophen (NORCO) 10-325 MG tablet Take 1-2 tablets by mouth every 8 (eight) hours as needed.  Marland Kitchen levothyroxine (SYNTHROID, LEVOTHROID) 50 MCG tablet TAKE 1 TABLET(50 MCG) BY MOUTH DAILY BEFORE BREAKFAST  . metoprolol tartrate (LOPRESSOR) 50 MG tablet TAKE 1 TABLET(50 MG) BY MOUTH TWICE DAILY  . mometasone (ASMANEX 60 METERED DOSES) 220 MCG/INH inhaler Inhale 2 puffs into the lungs 2 (two) times daily.  .  Multiple Vitamin (MULTIVITAMIN WITH MINERALS) TABS tablet Take 1 tablet by mouth daily.  Marland Kitchen neomycin-bacitracin-polymyxin (NEOSPORIN) ointment Apply 1 application topically as needed for wound care.  . Omega-3 Fatty Acids (FISH OIL) 1000 MG CPDR Take 1 capsule by mouth 2 (two) times daily.   . prednisoLONE 5 MG TABS tablet Take 5 mg by mouth daily.  Marland Kitchen  sulfamethoxazole-trimethoprim (BACTRIM DS,SEPTRA DS) 800-160 MG tablet Take 1 tablet by mouth every Monday, Wednesday, and Friday.  . vitamin B-12 (CYANOCOBALAMIN) 1000 MCG tablet Take 1,000 mcg by mouth every morning.   . vitamin C (ASCORBIC ACID) 500 MG tablet Take 500 mg by mouth 2 (two) times daily.  . vitamin E 400 UNIT capsule Take 400 Units by mouth every morning.   . clobetasol cream (TEMOVATE) 0.05 % Apply topically 2 (two) times daily. (Patient not taking: Reported on 04/12/2019)   No facility-administered encounter medications on file as of 05/16/2019.     ALLERGIES:  Allergies  Allergen Reactions  . Contrast Media [Iodinated Diagnostic Agents]     Single functioning kidney  . Other Hives    Neoprene fabric Strawberry   . Ether Rash    Took a long time to wake up Doesn't wake up from it     PHYSICAL EXAM:  ECOG Performance status: 1  Vitals:   05/16/19 1521  BP: (!) 139/59  Pulse: 85  Resp: 18  Temp: 98 F (36.7 C)  SpO2: 94%   Filed Weights   05/16/19 1521  Weight: 265 lb 8 oz (120.4 kg)    Physical Exam Vitals signs reviewed.  Constitutional:      Appearance: Normal appearance.  Cardiovascular:     Rate and Rhythm: Normal rate and regular rhythm.     Heart sounds: Normal heart sounds.  Pulmonary:     Effort: Pulmonary effort is normal.     Breath sounds: Normal breath sounds.  Abdominal:     General: There is no distension.     Palpations: Abdomen is soft.  Musculoskeletal:        General: No swelling.  Skin:    General: Skin is warm.  Neurological:     General: No focal deficit present.      Mental Status: He is alert and oriented to person, place, and time.  Psychiatric:        Mood and Affect: Mood normal.        Behavior: Behavior normal.      LABORATORY DATA:  I have reviewed the labs as listed.  CBC    Component Value Date/Time   WBC 144.7 (HH) 05/14/2019 1318   RBC 3.36 (L) 05/14/2019 1318   RBC 3.36 (L) 05/14/2019 1318   HGB 9.9 (L) 05/14/2019 1318   HCT 33.6 (L) 05/14/2019 1318   PLT 188 05/14/2019 1318   MCV 100.0 05/14/2019 1318   MCH 29.5 05/14/2019 1318   MCHC 29.5 (L) 05/14/2019 1318   RDW 15.2 05/14/2019 1318   LYMPHSABS 99.9 (H) 05/14/2019 1318   MONOABS 39.3 (H) 05/14/2019 1318   EOSABS 0.5 05/14/2019 1318   BASOSABS 0.3 (H) 05/14/2019 1318   CMP Latest Ref Rng & Units 05/14/2019 04/12/2019 03/01/2019  Glucose 70 - 99 mg/dL 124(H) 134(H) 117(H)  BUN 8 - 23 mg/dL 23 25(H) 22  Creatinine 0.61 - 1.24 mg/dL 1.52(H) 1.77(H) 1.49(H)  Sodium 135 - 145 mmol/L 136 140 139  Potassium 3.5 - 5.1 mmol/L 4.6 4.7 4.5  Chloride 98 - 111 mmol/L 106 110 107  CO2 22 - 32 mmol/L _0 Calcium 8.9 - 10.3 mg/dL 8.9 9.3 9.1  Total Protein 6.5 - 8.1 g/dL 6.1(L) 6.3(L) 6.0(L)  Total Bilirubin 0.3 - 1.2 mg/dL 0.8 0.6 0.3  Alkaline Phos 38 - 126 U/L 48 43 36(L)  AST 15 - 41 U/L _1 ALT 0 - 44 U/L 17  17 18       DIAGNOSTIC IMAGING:  I have independently reviewed the scans and discussed with the patient.   I have reviewed Venita Lick LPN's note and agree with the documentation.  I personally performed a face-to-face visit, made revisions and my assessment and plan is as follows.    ASSESSMENT & PLAN:   CLL (chronic lymphocytic leukemia) ([place]) 1.  Autoimmune hemolytic anemia: -He developed autoimmune hemolytic anemia as a complication of CLL in June 2019. -Prednisone 1 mg/kg (100 mg daily) started on 05/19/2018, gradually being tapered off. -He was also started on Procrit at that time as he is a Jehovah's Witness.  -His most recent prednisone taper  was on 03/07/2019, prednisone changed to 5 mg every other day. - He reported diarrhea which is ongoing for 1 month.  I have recommended C. difficile testing. -We reviewed his blood work.  Hemoglobin dropped to 9.9 by 1 g.  White count has gone up to 144 from 110. -Hence have recommended changing prednisone back to 5 mg every day. -I will reevaluate him in 2 weeks.  2.  Hypogammaglobulinemia: -His IgG level is in the 400 range.  However he does not have any recurrent infections requiring antibiotics. -Hence we will continue to monitor him.  He was treated with IVIG in February 2017, which was discontinued secondary to high cost.   3.  Stage III CKD: -He had left nephrectomy for RCC in 2005. -His creatinine between 1.4 and 1.7.  4.  ID prophylaxis: -We will continue Bactrim DS 1 tablet 3 times a week.     Total time spent is 25 minutes with more than 50% of the time spent face-to-face discussing his diagnosis, management and coordination of care.  Orders placed this encounter:  Orders Placed This Encounter  Procedures  . CBC with Differential/Platelet  . Comprehensive metabolic panel  . Clostridium difficile culture-fecal      Derek Jack, MD [place] [phone]

## 2019-05-16 NOTE — Assessment & Plan Note (Signed)
1.  Autoimmune hemolytic anemia: -He developed autoimmune hemolytic anemia as a complication of CLL in June 2019. -Prednisone 1 mg/kg (100 mg daily) started on 05/19/2018, gradually being tapered off. -He was also started on Procrit at that time as he is a Jehovah's Witness.  -His most recent prednisone taper was on 03/07/2019, prednisone changed to 5 mg every other day. - He reported diarrhea which is ongoing for 1 month.  I have recommended C. difficile testing. -We reviewed his blood work.  Hemoglobin dropped to 9.9 by 1 g.  White count has gone up to 144 from 110. -Hence have recommended changing prednisone back to 5 mg every day. -I will reevaluate him in 2 weeks.  2.  Hypogammaglobulinemia: -His IgG level is in the 400 range.  However he does not have any recurrent infections requiring antibiotics. -Hence we will continue to monitor him.  He was treated with IVIG in February 2017, which was discontinued secondary to high cost.   3.  Stage III CKD: -He had left nephrectomy for RCC in 2005. -His creatinine between 1.4 and 1.7.  4.  ID prophylaxis: -We will continue Bactrim DS 1 tablet 3 times a week.

## 2019-05-16 NOTE — Patient Instructions (Addendum)
[place] at [facility] Discharge Instructions  You were seen today by Dr. Delton Coombes. He went over your recent lab results. He will see you back in 2 weeks for labs and follow up. Increase your prednisone to 5 mg everyday.   Thank you for choosing [place] at [facility] to provide your oncology and hematology care.  To afford each patient quality time with our provider, please arrive at least 15 minutes before your scheduled appointment time.   If you have a lab appointment with the [facility] please come in thru the  Main Entrance and check in at the main information desk  You need to re-schedule your appointment should you arrive 10 or more minutes late.  We strive to give you quality time with our providers, and arriving late affects you and other patients whose appointments are after yours.  Also, if you no show three or more times for appointments you may be dismissed from the clinic at the providers discretion.     Again, thank you for choosing [facility].  Our hope is that these requests will decrease the amount of time that you wait before being seen by our physicians.       _____________________________________________________________  Should you have questions after your visit to [facility], please contact our office at [phone] between the hours of 8:00 a.m. and 4:30 p.m.  Voicemails left after 4:00 p.m. will not be returned until the following business day.  For prescription refill requests, have your pharmacy contact our office and allow 72 hours.    Cancer Center Support Programs:   > Cancer Support Group  2nd Tuesday of the month 1pm-2pm, Journey Room

## 2019-05-17 ENCOUNTER — Other Ambulatory Visit (HOSPITAL_COMMUNITY): Payer: Self-pay | Admitting: Endocrinology

## 2019-05-17 DIAGNOSIS — D591 Other autoimmune hemolytic anemias: Secondary | ICD-10-CM | POA: Diagnosis not present

## 2019-05-17 DIAGNOSIS — R197 Diarrhea, unspecified: Secondary | ICD-10-CM

## 2019-05-17 DIAGNOSIS — Z905 Acquired absence of kidney: Secondary | ICD-10-CM | POA: Diagnosis not present

## 2019-05-17 DIAGNOSIS — C9110 Chronic lymphocytic leukemia of B-cell type not having achieved remission: Secondary | ICD-10-CM | POA: Diagnosis not present

## 2019-05-17 DIAGNOSIS — N183 Chronic kidney disease, stage 3 (moderate): Secondary | ICD-10-CM | POA: Diagnosis not present

## 2019-05-17 DIAGNOSIS — D801 Nonfamilial hypogammaglobulinemia: Secondary | ICD-10-CM | POA: Diagnosis not present

## 2019-05-17 LAB — C DIFFICILE QUICK SCREEN W PCR REFLEX
C Diff antigen: NEGATIVE
C Diff interpretation: NOT DETECTED
C Diff toxin: NEGATIVE

## 2019-05-22 ENCOUNTER — Other Ambulatory Visit: Payer: Self-pay | Admitting: Family Medicine

## 2019-05-23 DIAGNOSIS — G4733 Obstructive sleep apnea (adult) (pediatric): Secondary | ICD-10-CM | POA: Diagnosis not present

## 2019-05-28 ENCOUNTER — Other Ambulatory Visit: Payer: Self-pay

## 2019-05-28 ENCOUNTER — Other Ambulatory Visit: Payer: Medicare Other

## 2019-05-28 ENCOUNTER — Other Ambulatory Visit: Payer: Self-pay | Admitting: Family Medicine

## 2019-05-28 ENCOUNTER — Other Ambulatory Visit (HOSPITAL_COMMUNITY): Payer: Medicare Other

## 2019-05-28 DIAGNOSIS — Z20822 Contact with and (suspected) exposure to covid-19: Secondary | ICD-10-CM

## 2019-05-28 DIAGNOSIS — R6889 Other general symptoms and signs: Secondary | ICD-10-CM | POA: Diagnosis not present

## 2019-05-28 MED ORDER — HYDROCODONE-ACETAMINOPHEN 10-325 MG PO TABS: 1.0000 | ORAL_TABLET | Freq: Three times a day (TID) | ORAL | 0 refills | Status: AC | PRN

## 2019-05-28 NOTE — Telephone Encounter (Signed)
Ok to refill??  Last office visit 01/12/2019.  Last refill 04/27/2019.

## 2019-05-30 ENCOUNTER — Ambulatory Visit (HOSPITAL_COMMUNITY): Payer: Medicare Other | Admitting: Hematology

## 2019-05-30 ENCOUNTER — Other Ambulatory Visit (HOSPITAL_COMMUNITY): Payer: Medicare Other

## 2019-06-01 LAB — NOVEL CORONAVIRUS, NAA: SARS-CoV-2, NAA: DETECTED — AB

## 2019-06-02 ENCOUNTER — Inpatient Hospital Stay (HOSPITAL_COMMUNITY)
Admission: EM | Admit: 2019-06-02 | Discharge: 2019-07-07 | DRG: 177 | Disposition: E | Payer: Medicare Other | Attending: Internal Medicine | Admitting: Internal Medicine

## 2019-06-02 ENCOUNTER — Encounter: Payer: Self-pay | Admitting: Family Medicine

## 2019-06-02 ENCOUNTER — Other Ambulatory Visit: Payer: Self-pay

## 2019-06-02 ENCOUNTER — Emergency Department (HOSPITAL_COMMUNITY): Payer: Medicare Other

## 2019-06-02 ENCOUNTER — Telehealth: Payer: Self-pay | Admitting: Family Medicine

## 2019-06-02 ENCOUNTER — Encounter (HOSPITAL_COMMUNITY): Payer: Self-pay | Admitting: Emergency Medicine

## 2019-06-02 DIAGNOSIS — Z66 Do not resuscitate: Secondary | ICD-10-CM | POA: Diagnosis not present

## 2019-06-02 DIAGNOSIS — J440 Chronic obstructive pulmonary disease with acute lower respiratory infection: Secondary | ICD-10-CM | POA: Diagnosis present

## 2019-06-02 DIAGNOSIS — Z6841 Body Mass Index (BMI) 40.0 and over, adult: Secondary | ICD-10-CM

## 2019-06-02 DIAGNOSIS — Z8673 Personal history of transient ischemic attack (TIA), and cerebral infarction without residual deficits: Secondary | ICD-10-CM | POA: Diagnosis not present

## 2019-06-02 DIAGNOSIS — E038 Other specified hypothyroidism: Secondary | ICD-10-CM | POA: Diagnosis not present

## 2019-06-02 DIAGNOSIS — H9190 Unspecified hearing loss, unspecified ear: Secondary | ICD-10-CM | POA: Diagnosis present

## 2019-06-02 DIAGNOSIS — U071 COVID-19: Principal | ICD-10-CM | POA: Diagnosis present

## 2019-06-02 DIAGNOSIS — F329 Major depressive disorder, single episode, unspecified: Secondary | ICD-10-CM | POA: Diagnosis present

## 2019-06-02 DIAGNOSIS — Z9981 Dependence on supplemental oxygen: Secondary | ICD-10-CM | POA: Diagnosis not present

## 2019-06-02 DIAGNOSIS — J1289 Other viral pneumonia: Secondary | ICD-10-CM | POA: Diagnosis not present

## 2019-06-02 DIAGNOSIS — I130 Hypertensive heart and chronic kidney disease with heart failure and stage 1 through stage 4 chronic kidney disease, or unspecified chronic kidney disease: Secondary | ICD-10-CM | POA: Diagnosis present

## 2019-06-02 DIAGNOSIS — N183 Chronic kidney disease, stage 3 unspecified: Secondary | ICD-10-CM | POA: Diagnosis present

## 2019-06-02 DIAGNOSIS — D692 Other nonthrombocytopenic purpura: Secondary | ICD-10-CM | POA: Diagnosis not present

## 2019-06-02 DIAGNOSIS — J80 Acute respiratory distress syndrome: Secondary | ICD-10-CM | POA: Diagnosis present

## 2019-06-02 DIAGNOSIS — J4489 Other specified chronic obstructive pulmonary disease: Secondary | ICD-10-CM | POA: Diagnosis present

## 2019-06-02 DIAGNOSIS — R0902 Hypoxemia: Secondary | ICD-10-CM

## 2019-06-02 DIAGNOSIS — J449 Chronic obstructive pulmonary disease, unspecified: Secondary | ICD-10-CM

## 2019-06-02 DIAGNOSIS — R05 Cough: Secondary | ICD-10-CM | POA: Diagnosis not present

## 2019-06-02 DIAGNOSIS — Z79890 Hormone replacement therapy (postmenopausal): Secondary | ICD-10-CM

## 2019-06-02 DIAGNOSIS — C9110 Chronic lymphocytic leukemia of B-cell type not having achieved remission: Secondary | ICD-10-CM | POA: Diagnosis not present

## 2019-06-02 DIAGNOSIS — Z87891 Personal history of nicotine dependence: Secondary | ICD-10-CM

## 2019-06-02 DIAGNOSIS — D899 Disorder involving the immune mechanism, unspecified: Secondary | ICD-10-CM | POA: Diagnosis present

## 2019-06-02 DIAGNOSIS — Z7952 Long term (current) use of systemic steroids: Secondary | ICD-10-CM

## 2019-06-02 DIAGNOSIS — M1990 Unspecified osteoarthritis, unspecified site: Secondary | ICD-10-CM | POA: Diagnosis not present

## 2019-06-02 DIAGNOSIS — Z85528 Personal history of other malignant neoplasm of kidney: Secondary | ICD-10-CM

## 2019-06-02 DIAGNOSIS — Z905 Acquired absence of kidney: Secondary | ICD-10-CM | POA: Diagnosis not present

## 2019-06-02 DIAGNOSIS — G43909 Migraine, unspecified, not intractable, without status migrainosus: Secondary | ICD-10-CM | POA: Diagnosis present

## 2019-06-02 DIAGNOSIS — I878 Other specified disorders of veins: Secondary | ICD-10-CM | POA: Diagnosis not present

## 2019-06-02 DIAGNOSIS — E039 Hypothyroidism, unspecified: Secondary | ICD-10-CM | POA: Diagnosis present

## 2019-06-02 DIAGNOSIS — I10 Essential (primary) hypertension: Secondary | ICD-10-CM | POA: Diagnosis present

## 2019-06-02 DIAGNOSIS — K219 Gastro-esophageal reflux disease without esophagitis: Secondary | ICD-10-CM | POA: Diagnosis not present

## 2019-06-02 DIAGNOSIS — Z7902 Long term (current) use of antithrombotics/antiplatelets: Secondary | ICD-10-CM

## 2019-06-02 DIAGNOSIS — R5381 Other malaise: Secondary | ICD-10-CM | POA: Diagnosis not present

## 2019-06-02 DIAGNOSIS — D591 Other autoimmune hemolytic anemias: Secondary | ICD-10-CM | POA: Diagnosis present

## 2019-06-02 DIAGNOSIS — J189 Pneumonia, unspecified organism: Secondary | ICD-10-CM | POA: Diagnosis not present

## 2019-06-02 DIAGNOSIS — F32A Depression, unspecified: Secondary | ICD-10-CM | POA: Diagnosis present

## 2019-06-02 DIAGNOSIS — J9601 Acute respiratory failure with hypoxia: Secondary | ICD-10-CM | POA: Diagnosis not present

## 2019-06-02 DIAGNOSIS — Z91041 Radiographic dye allergy status: Secondary | ICD-10-CM

## 2019-06-02 DIAGNOSIS — R918 Other nonspecific abnormal finding of lung field: Secondary | ICD-10-CM | POA: Diagnosis not present

## 2019-06-02 DIAGNOSIS — I5031 Acute diastolic (congestive) heart failure: Secondary | ICD-10-CM | POA: Diagnosis not present

## 2019-06-02 DIAGNOSIS — Z79899 Other long term (current) drug therapy: Secondary | ICD-10-CM

## 2019-06-02 DIAGNOSIS — R0602 Shortness of breath: Secondary | ICD-10-CM

## 2019-06-02 DIAGNOSIS — J9621 Acute and chronic respiratory failure with hypoxia: Secondary | ICD-10-CM | POA: Diagnosis not present

## 2019-06-02 DIAGNOSIS — E662 Morbid (severe) obesity with alveolar hypoventilation: Secondary | ICD-10-CM | POA: Diagnosis present

## 2019-06-02 DIAGNOSIS — J1282 Pneumonia due to coronavirus disease 2019: Secondary | ICD-10-CM | POA: Diagnosis present

## 2019-06-02 DIAGNOSIS — E875 Hyperkalemia: Secondary | ICD-10-CM | POA: Diagnosis present

## 2019-06-02 DIAGNOSIS — I517 Cardiomegaly: Secondary | ICD-10-CM | POA: Diagnosis not present

## 2019-06-02 DIAGNOSIS — R825 Elevated urine levels of drugs, medicaments and biological substances: Secondary | ICD-10-CM | POA: Diagnosis not present

## 2019-06-02 DIAGNOSIS — Z884 Allergy status to anesthetic agent status: Secondary | ICD-10-CM

## 2019-06-02 DIAGNOSIS — Z9109 Other allergy status, other than to drugs and biological substances: Secondary | ICD-10-CM

## 2019-06-02 HISTORY — DX: COVID-19: U07.1

## 2019-06-02 HISTORY — DX: Unspecified hearing loss, unspecified ear: H91.90

## 2019-06-02 HISTORY — DX: Reserved for inherently not codable concepts without codable children: IMO0001

## 2019-06-02 LAB — CBC WITH DIFFERENTIAL/PLATELET
Abs Immature Granulocytes: 0.31 10*3/uL — ABNORMAL HIGH (ref 0.00–0.07)
Basophils Absolute: 0.4 10*3/uL — ABNORMAL HIGH (ref 0.0–0.1)
Basophils Relative: 0 %
Eosinophils Absolute: 0 10*3/uL (ref 0.0–0.5)
Eosinophils Relative: 0 %
HCT: 30 % — ABNORMAL LOW (ref 39.0–52.0)
Hemoglobin: 8.8 g/dL — ABNORMAL LOW (ref 13.0–17.0)
Immature Granulocytes: 0 %
Lymphocytes Relative: 80 %
Lymphs Abs: 91.4 10*3/uL — ABNORMAL HIGH (ref 0.7–4.0)
MCH: 29.2 pg (ref 26.0–34.0)
MCHC: 29.3 g/dL — ABNORMAL LOW (ref 30.0–36.0)
MCV: 99.7 fL (ref 80.0–100.0)
Monocytes Absolute: 16.7 10*3/uL — ABNORMAL HIGH (ref 0.1–1.0)
Monocytes Relative: 15 %
Neutro Abs: 6.1 10*3/uL (ref 1.7–7.7)
Neutrophils Relative %: 5 %
Platelets: 129 10*3/uL — ABNORMAL LOW (ref 150–400)
RBC: 3.01 MIL/uL — ABNORMAL LOW (ref 4.22–5.81)
RDW: 14.7 % (ref 11.5–15.5)
WBC: 114.9 10*3/uL (ref 4.0–10.5)
nRBC: 0 % (ref 0.0–0.2)

## 2019-06-02 LAB — TROPONIN I (HIGH SENSITIVITY): Troponin I (High Sensitivity): 9 ng/L (ref <18)

## 2019-06-02 LAB — COMPREHENSIVE METABOLIC PANEL
ALT: 17 U/L (ref 0–44)
AST: 16 U/L (ref 15–41)
Albumin: 3.8 g/dL (ref 3.5–5.0)
Alkaline Phosphatase: 57 U/L (ref 38–126)
Anion gap: 13 (ref 5–15)
BUN: 27 mg/dL — ABNORMAL HIGH (ref 8–23)
CO2: 19 mmol/L — ABNORMAL LOW (ref 22–32)
Calcium: 9.2 mg/dL (ref 8.9–10.3)
Chloride: 101 mmol/L (ref 98–111)
Creatinine, Ser: 1.88 mg/dL — ABNORMAL HIGH (ref 0.61–1.24)
GFR calc Af Amer: 41 mL/min — ABNORMAL LOW (ref >60)
GFR calc non Af Amer: 36 mL/min — ABNORMAL LOW (ref >60)
Glucose, Bld: 144 mg/dL — ABNORMAL HIGH (ref 70–99)
Potassium: 4.4 mmol/L (ref 3.5–5.1)
Sodium: 133 mmol/L — ABNORMAL LOW (ref 135–145)
Total Bilirubin: 0.8 mg/dL (ref 0.3–1.2)
Total Protein: 6.2 g/dL — ABNORMAL LOW (ref 6.5–8.1)

## 2019-06-02 LAB — C-REACTIVE PROTEIN: CRP: 18.1 mg/dL — ABNORMAL HIGH (ref <1.0)

## 2019-06-02 LAB — CK: Total CK: 35 U/L — ABNORMAL LOW (ref 49–397)

## 2019-06-02 LAB — FERRITIN: Ferritin: 272 ng/mL (ref 24–336)

## 2019-06-02 LAB — LACTATE DEHYDROGENASE: LDH: 128 U/L (ref 98–192)

## 2019-06-02 MED ORDER — METOPROLOL TARTRATE 25 MG PO TABS
12.5000 mg | ORAL_TABLET | Freq: Two times a day (BID) | ORAL | Status: DC
  Administered 2019-06-03 – 2019-06-16 (×26): 12.5 mg via ORAL
  Filled 2019-06-02 (×27): qty 1

## 2019-06-02 MED ORDER — SODIUM CHLORIDE 0.9 % IV SOLN
100.0000 mg | INTRAVENOUS | Status: AC
  Administered 2019-06-03 – 2019-06-06 (×4): 100 mg via INTRAVENOUS
  Filled 2019-06-02 (×4): qty 20

## 2019-06-02 MED ORDER — PANTOPRAZOLE SODIUM 40 MG PO TBEC
40.0000 mg | DELAYED_RELEASE_TABLET | Freq: Every day | ORAL | Status: DC
  Administered 2019-06-02 – 2019-06-16 (×15): 40 mg via ORAL
  Filled 2019-06-02 (×15): qty 1

## 2019-06-02 MED ORDER — MOMETASONE FUROATE 220 MCG/INH IN AEPB: 2.0000 | INHALATION_SPRAY | Freq: Two times a day (BID) | RESPIRATORY_TRACT | Status: DC

## 2019-06-02 MED ORDER — ONDANSETRON HCL 4 MG/2ML IJ SOLN
4.0000 mg | Freq: Four times a day (QID) | INTRAMUSCULAR | Status: DC | PRN
  Administered 2019-06-02 – 2019-06-09 (×2): 4 mg via INTRAVENOUS
  Filled 2019-06-02 (×2): qty 2

## 2019-06-02 MED ORDER — ALBUTEROL SULFATE HFA 108 (90 BASE) MCG/ACT IN AERS
2.0000 | INHALATION_SPRAY | RESPIRATORY_TRACT | Status: AC
  Administered 2019-06-02: 2 via RESPIRATORY_TRACT
  Filled 2019-06-02: qty 6.7

## 2019-06-02 MED ORDER — SODIUM CHLORIDE 0.9 % IV SOLN
INTRAVENOUS | Status: AC
  Administered 2019-06-02 (×2): via INTRAVENOUS

## 2019-06-02 MED ORDER — FOLIC ACID 1 MG PO TABS
1.0000 mg | ORAL_TABLET | Freq: Every day | ORAL | Status: DC
  Administered 2019-06-03 – 2019-06-16 (×14): 1 mg via ORAL
  Filled 2019-06-02 (×13): qty 1

## 2019-06-02 MED ORDER — IPRATROPIUM-ALBUTEROL 20-100 MCG/ACT IN AERS
1.0000 | INHALATION_SPRAY | Freq: Four times a day (QID) | RESPIRATORY_TRACT | Status: DC
  Administered 2019-06-02 – 2019-06-11 (×32): 1 via RESPIRATORY_TRACT
  Filled 2019-06-02: qty 4

## 2019-06-02 MED ORDER — HYDROCODONE-ACETAMINOPHEN 10-325 MG PO TABS
1.0000 | ORAL_TABLET | Freq: Three times a day (TID) | ORAL | Status: DC | PRN
  Administered 2019-06-02: 1 via ORAL

## 2019-06-02 MED ORDER — FLUTICASONE PROPIONATE HFA 44 MCG/ACT IN AERO
2.0000 | INHALATION_SPRAY | Freq: Two times a day (BID) | RESPIRATORY_TRACT | Status: DC
  Administered 2019-06-02 – 2019-06-11 (×17): 2 via RESPIRATORY_TRACT
  Filled 2019-06-02 (×3): qty 10.6

## 2019-06-02 MED ORDER — HEPARIN SODIUM (PORCINE) 5000 UNIT/ML IJ SOLN
5000.0000 [IU] | Freq: Three times a day (TID) | INTRAMUSCULAR | Status: DC
  Administered 2019-06-02 – 2019-06-13 (×33): 5000 [IU] via SUBCUTANEOUS
  Filled 2019-06-02 (×33): qty 1

## 2019-06-02 MED ORDER — LEVOTHYROXINE SODIUM 50 MCG PO TABS
50.0000 ug | ORAL_TABLET | Freq: Every day | ORAL | Status: DC
  Administered 2019-06-03 – 2019-06-16 (×13): 50 ug via ORAL
  Filled 2019-06-02 (×14): qty 1

## 2019-06-02 MED ORDER — CLOPIDOGREL BISULFATE 75 MG PO TABS
75.0000 mg | ORAL_TABLET | Freq: Every day | ORAL | Status: DC
  Administered 2019-06-03 – 2019-06-16 (×14): 75 mg via ORAL
  Filled 2019-06-02 (×14): qty 1

## 2019-06-02 MED ORDER — SULFAMETHOXAZOLE-TRIMETHOPRIM 800-160 MG PO TABS
1.0000 | ORAL_TABLET | ORAL | Status: DC
  Administered 2019-06-04 – 2019-06-15 (×6): 1 via ORAL
  Filled 2019-06-02 (×6): qty 1

## 2019-06-02 MED ORDER — SODIUM CHLORIDE 0.9 % IV SOLN
200.0000 mg | Freq: Once | INTRAVENOUS | Status: AC
  Administered 2019-06-02: 200 mg via INTRAVENOUS
  Filled 2019-06-02: qty 40

## 2019-06-02 MED ORDER — AEROCHAMBER Z-STAT PLUS/MEDIUM MISC
1.0000 | Freq: Once | Status: AC
  Administered 2019-06-02: 1

## 2019-06-02 MED ORDER — PIPERACILLIN-TAZOBACTAM 3.375 G IVPB 30 MIN
3.3750 g | Freq: Once | INTRAVENOUS | Status: AC
  Administered 2019-06-02: 15:00:00 3.375 g via INTRAVENOUS
  Filled 2019-06-02: qty 50

## 2019-06-02 MED ORDER — HYDROCODONE-ACETAMINOPHEN 10-325 MG PO TABS
ORAL_TABLET | ORAL | Status: AC
  Filled 2019-06-02: qty 1

## 2019-06-02 MED ORDER — ACETAMINOPHEN 500 MG PO TABS
1000.0000 mg | ORAL_TABLET | Freq: Once | ORAL | Status: AC
  Administered 2019-06-02: 14:00:00 1000 mg via ORAL
  Filled 2019-06-02: qty 2

## 2019-06-02 MED ORDER — METHYLPREDNISOLONE SODIUM SUCC 125 MG IJ SOLR
60.0000 mg | Freq: Two times a day (BID) | INTRAMUSCULAR | Status: DC
  Administered 2019-06-02: 60 mg via INTRAVENOUS
  Filled 2019-06-02: qty 2

## 2019-06-02 MED ORDER — VANCOMYCIN HCL IN DEXTROSE 1-5 GM/200ML-% IV SOLN
1000.0000 mg | Freq: Once | INTRAVENOUS | Status: AC
  Administered 2019-06-02: 15:00:00 1000 mg via INTRAVENOUS
  Filled 2019-06-02: qty 200

## 2019-06-02 MED ORDER — ONDANSETRON HCL 4 MG PO TABS
4.0000 mg | ORAL_TABLET | Freq: Four times a day (QID) | ORAL | Status: DC | PRN
  Administered 2019-06-15: 4 mg via ORAL
  Filled 2019-06-02: qty 1

## 2019-06-02 MED ORDER — ACETAMINOPHEN 325 MG PO TABS
650.0000 mg | ORAL_TABLET | Freq: Four times a day (QID) | ORAL | Status: DC | PRN
  Administered 2019-06-02 – 2019-06-09 (×3): 650 mg via ORAL
  Filled 2019-06-02 (×3): qty 2

## 2019-06-02 MED ORDER — CITALOPRAM HYDROBROMIDE 10 MG PO TABS
10.0000 mg | ORAL_TABLET | Freq: Every day | ORAL | Status: DC
  Administered 2019-06-03 – 2019-06-16 (×14): 10 mg via ORAL
  Filled 2019-06-02 (×16): qty 1

## 2019-06-02 NOTE — Progress Notes (Signed)
Pharmacy Brief Note  O:  ALT: 17 CXR: consistent with possible PNA SpO2: 92% on 2L [place]  A/P:  Patient meets criteria for remdesevir therapy. Will initiate remdesivir 200 mg iv once followed by 100 mg iv daily x 4 days. Will f/u ALT.   Napoleon Form, [facility] 05/15/2019 7:59 PM

## 2019-06-02 NOTE — Telephone Encounter (Signed)
Called Dr Buelah Manis and notfied her of + Covid. Name, DOB, phone number.  Called pt but unable to VM but number not in service.

## 2019-06-02 NOTE — ED Notes (Signed)
Pt noted to have expiratory wheezes. EDP notified

## 2019-06-02 NOTE — ED Notes (Signed)
Noted pt has no blood card in wallet. This is also an advance directive.

## 2019-06-02 NOTE — ED Notes (Signed)
Wife  [phone]

## 2019-06-02 NOTE — Plan of Care (Signed)
Discussed with patient plan of care for the evening, pain management and admission procedures.  Wife called and discussed concerns for the patient.  He is supposed to be a full code at this time

## 2019-06-02 NOTE — ED Notes (Signed)
ED TO INPATIENT HANDOFF REPORT  ED Nurse Name and Phone #: Osie Cheeks Name/Age/Gender [name] Ball 68 y.o. male Room/Bed: APA07/APA07  Code Status   Code Status: Full Code  Home/SNF/Other Home Patient oriented to: self, place, time and situation Is this baseline? Yes   Triage Complete: Triage complete  Chief Complaint Shortness of Breath  Triage Note Pt brought from home by ems for shortness of breath. Tested positive for covid a few days ago. Sats were found to be around 70 by ems on room air.    Allergies Allergies  Allergen Reactions  . Contrast Media [Iodinated Diagnostic Agents]     Single functioning kidney  . Other Hives    Neoprene fabric Strawberry   . Ether Rash    Took a long time to wake up Doesn't wake up from it    Level of Care/Admitting Diagnosis ED Disposition    ED Disposition Condition [place]: [place] [100101]  Level of Care: Progressive [102]  Covid Evaluation: Confirmed COVID Positive  Isolation Risk Level: Low Risk/Droplet (Less than 4L [place] supplementation)  Diagnosis: Pneumonia due to COVID-19 virus [[id number]]  Admitting Physician: Barton Dubois [[id number]]  Attending Physician: Barton Dubois [[id number]]  Estimated length of stay: past midnight tomorrow  Certification:: I certify this patient will need inpatient services for at least 2 midnights  PT Class (Do Not Modify): Inpatient [101]  PT Acc Code (Do Not Modify): Private [1]       B Medical/Surgery History Past Medical History:  Diagnosis Date  . Allergy   . Arthritis   . Asthma   . Cancer ([place])    kidney  . CLL (chronic lymphocytic leukemia) ([place]) 2016  . COPD (chronic obstructive pulmonary disease) ([place])   . COVID-19   . Diverticulitis   . Diverticulosis    colonoscopy 3/15  . GERD (gastroesophageal reflux disease)   . Hearing impaired   . Hypertension   . Low testosterone   . Migraine   . Shingles 03/31/2016  . Solar  purpura ([place])   . Thyroid disease    hypothryoid  . Ulcer   . Ventral hernia FEB 2015 CT    Large infra umbilical ventral hernia containing loops of small   Past Surgical History:  Procedure Laterality Date  . abdominal mesh inserted  june 2006, may 2008,dec 2010  . CHOLECYSTECTOMY  june 2007  . COLON SURGERY  sept 2007  . COLONOSCOPY    . COLONOSCOPY N/A 02/20/2014   Procedure: COLONOSCOPY;  Surgeon: Danie Binder, MD;  Location: [facility];  Service: Endoscopy;  Laterality: N/A;  . HERNIA REPAIR  05/2005 5/20108, 11/2009   with mesh  . KIDNEY CYST REMOVAL  jan 2005   renal cell carcinoma  . knee minescus repair Left feb 2007  . NEPHRECTOMY Left 12/2003     A IV Location/Drains/Wounds Patient Lines/Drains/Airways Status   Active Line/Drains/Airways    Name:   Placement date:   Placement time:   Site:   Days:   Peripheral IV 05/17/2019 Left Antecubital   05/16/2019    1334    Antecubital   less than 1   Peripheral IV 05/26/2019 Left Hand   05/09/2019    1453    Hand   less than 1          Intake/Output Last 24 hours  Intake/Output Summary (Last 24 hours) at 05/09/2019 1606 Last data filed at 05/11/2019 1514 Gross  per 24 hour  Intake 50 ml  Output -  Net 50 ml    Labs/Imaging Results for orders placed or performed during the hospital encounter of 05/31/2019 (from the past 48 hour(s))  CBC with Differential/Platelet     Status: Abnormal   Collection Time: 05/12/2019  1:30 PM  Result Value Ref Range   WBC 114.9 (HH) 4.0 - 10.5 K/uL    Comment: REPEATED TO VERIFY WHITE COUNT CONFIRMED ON SMEAR THIS CRITICAL RESULT HAS VERIFIED AND BEEN CALLED TO [place] [facility]. BY LATISHA HENDERSON ON 06 27 2020 AT 1358, AND HAS BEEN READ BACK. CRITICAL RESULT VERIFIED    RBC 3.01 (L) 4.22 - 5.81 MIL/uL   Hemoglobin 8.8 (L) 13.0 - 17.0 g/dL   HCT 30.0 (L) 39.0 - 52.0 %   MCV 99.7 80.0 - 100.0 fL   MCH 29.2 26.0 - 34.0 pg   MCHC 29.3 (L) 30.0 - 36.0 g/dL   RDW 14.7 11.5 - 15.5 %   Platelets  129 (L) 150 - 400 K/uL    Comment: REPEATED TO VERIFY PLATELET COUNT CONFIRMED BY SMEAR SPECIMEN CHECKED FOR CLOTS    nRBC 0.0 0.0 - 0.2 %   Neutrophils Relative % 5 %   Neutro Abs 6.1 1.7 - 7.7 K/uL   Lymphocytes Relative 80 %   Lymphs Abs 91.4 (H) 0.7 - 4.0 K/uL   Monocytes Relative 15 %   Monocytes Absolute 16.7 (H) 0.1 - 1.0 K/uL   Eosinophils Relative 0 %   Eosinophils Absolute 0.0 0.0 - 0.5 K/uL   Basophils Relative 0 %   Basophils Absolute 0.4 (H) 0.0 - 0.1 K/uL   Immature Granulocytes 0 %   Abs Immature Granulocytes 0.31 (H) 0.00 - 0.07 K/uL    Comment: Performed at [facility], [address], [place], [place] [address]  Comprehensive metabolic panel     Status: Abnormal   Collection Time: 05/18/2019  1:30 PM  Result Value Ref Range   Sodium 133 (L) 135 - 145 mmol/L   Potassium 4.4 3.5 - 5.1 mmol/L   Chloride 101 98 - 111 mmol/L   CO2 19 (L) 22 - 32 mmol/L   Glucose, Bld 144 (H) 70 - 99 mg/dL   BUN 27 (H) 8 - 23 mg/dL   Creatinine, Ser 1.88 (H) 0.61 - 1.24 mg/dL   Calcium 9.2 8.9 - 10.3 mg/dL   Total Protein 6.2 (L) 6.5 - 8.1 g/dL   Albumin 3.8 3.5 - 5.0 g/dL   AST 16 15 - 41 U/L   ALT 17 0 - 44 U/L   Alkaline Phosphatase 57 38 - 126 U/L   Total Bilirubin 0.8 0.3 - 1.2 mg/dL   GFR calc non Af Amer 36 (L) >60 mL/min   GFR calc Af Amer 41 (L) >60 mL/min   Anion gap 13 5 - 15    Comment: Performed at [facility], [address], [place], [place] [address]  Troponin I (High Sensitivity)     Status: None   Collection Time: 05/31/2019  1:30 PM  Result Value Ref Range   Troponin I (High Sensitivity) 9.00 <18 ng/L    Comment: (NOTE) Elevated high sensitivity troponin I (hsTnI) values and significant  changes across serial measurements may suggest ACS but many other  chronic and acute conditions are known to elevate hsTnI results.  Refer to the "Links" section for chest pain algorithms and additional  guidance. Performed at [facility], [address] [place],  [place] [address]  C-reactive protein     Status: Abnormal   Collection Time: 05/12/2019  1:30 PM  Result Value Ref Range   CRP 18.1 (H) <1.0 mg/dL    Comment: Performed at [facility], [address], [place], [place] [address]  CK     Status: Abnormal   Collection Time: 05/27/2019  1:30 PM  Result Value Ref Range   Total CK 35 (L) 49 - 397 U/L    Comment: Performed at [facility], [address] [place], [place] [address]  Lactate dehydrogenase     Status: None   Collection Time: 05/17/2019  1:30 PM  Result Value Ref Range   LDH 128 98 - 192 U/L    Comment: Performed at [facility] [address] [place], [place] [address]   Dg Chest Port 1 View  Result Date: 05/20/2019 CLINICAL DATA:  Shortness of breath and cough EXAM: PORTABLE CHEST 1 VIEW COMPARISON:  June 16, 2016. FINDINGS: There is an area of ill-defined opacity adjacent to the left heart border which is suspicious for an area of pneumonia. Lungs elsewhere clear. Heart is upper normal in size with pulmonary vascularity normal. No adenopathy. No bone lesions. IMPRESSION: Ill-defined opacity adjacent to the left heart border suspicious for pneumonia. Lungs elsewhere clear. Heart upper normal in size. Followup PA and lateral chest radiographs recommended in 3-4 weeks following trial of antibiotic therapy to ensure resolution and exclude underlying malignancy. Electronically Signed   By: Lowella Grip III M.D.   On: 05/18/2019 14:05    Pending Labs Unresulted Labs (From admission, onward)    Start     Ordered   06/03/19 0500  C-reactive protein  Daily,   R     05/09/2019 1548   06/03/19 0500  Comprehensive metabolic panel  Daily,   R     05/24/2019 1548   06/03/19 0500  Magnesium  Daily,   R     05/15/2019 1548   06/03/19 0500  Phosphorus  Daily,   R     05/18/2019 1548   06/03/19 0500  Ferritin  Daily,   R     05/19/2019 1548   06/03/19 0500  CBC with Differential/Platelet  Daily,   R     06/05/2019 1548   06/03/19 0500  D-dimer,  quantitative (not at [facility])  Daily,   R     05/07/2019 1548   05/22/2019 1547  Strep pneumoniae urinary antigen  Once,   STAT     05/15/2019 1548   05/26/2019 1543  HIV antibody (Routine Testing)  Once,   STAT     06/03/2019 1548   05/21/2019 1543  ABO/Rh  Once,   STAT     05/07/2019 1548   06/01/2019 1316  Ferritin (Iron Binding Protein)  ONCE - STAT,   STAT     05/31/2019 1316          Vitals/Pain Today's Vitals   05/15/2019 1443 05/12/2019 1500 05/22/2019 1501 06/05/2019 1530  BP:  (!) 110/42 (!) 110/42 (!) 110/45  Pulse:   76 79  Resp:  (!) 22 (!) 24 (!) 22  Temp: 98.4 F (36.9 C)     TempSrc: Oral     SpO2:   94% 95%  Weight:      Height:      PainSc:        Isolation Precautions Airborne and Contact precautions  Medications Medications  heparin injection 5,000 Units (has no administration in time range)  0.9 %  sodium  chloride infusion (has no administration in time range)  Ipratropium-Albuterol (COMBIVENT) respimat 1 puff (has no administration in time range)  pantoprazole (PROTONIX) EC tablet 40 mg (has no administration in time range)  acetaminophen (TYLENOL) tablet 650 mg (has no administration in time range)  ondansetron (ZOFRAN) tablet 4 mg (has no administration in time range)    Or  ondansetron (ZOFRAN) injection 4 mg (has no administration in time range)  methylPREDNISolone sodium succinate (SOLU-MEDROL) 125 mg/2 mL injection 60 mg (has no administration in time range)  citalopram (CELEXA) tablet 10 mg (has no administration in time range)  clopidogrel (PLAVIX) tablet 75 mg (has no administration in time range)  levothyroxine (SYNTHROID) tablet 50 mcg (has no administration in time range)  metoprolol tartrate (LOPRESSOR) tablet 12.5 mg (has no administration in time range)  sulfamethoxazole-trimethoprim (BACTRIM DS) 800-160 MG per tablet 1 tablet (has no administration in time range)  folic acid (FOLVITE) tablet 1 mg (has no administration in time range)  fluticasone (FLOVENT  HFA) 44 MCG/ACT inhaler 2 puff (has no administration in time range)  acetaminophen (TYLENOL) tablet 1,000 mg (1,000 mg Oral Given 05/18/2019 1338)  vancomycin (VANCOCIN) IVPB 1000 mg/200 mL premix (0 mg Intravenous Stopped 05/23/2019 1547)  piperacillin-tazobactam (ZOSYN) IVPB 3.375 g (0 g Intravenous Stopped 05/15/2019 1514)  albuterol (VENTOLIN HFA) 108 (90 Base) MCG/ACT inhaler 2 puff (2 puffs Inhalation Given 05/10/2019 1455)  aerochamber Z-Stat Plus/medium 1 each (1 each Other Given 06/04/2019 1455)    Mobility walks Low fall risk   Focused Assessments    R Recommendations: See Admitting Provider Note  Report given to:   Additional Notes: pt is deaf  Able to sign , use interpretor

## 2019-06-02 NOTE — H&P (Signed)
History and Physical    [name] Ball PXT:[id number] DOB: November 28, 1951 DOA: 05/30/2019  Referring MD/NP/PA: Dr. Sabra Heck. PCP: Alycia Rossetti, MD  Patient coming from: Home  Chief Complaint: COVID PNA, SOB and hypoxia.  HPI: [name] Ball is a 68 y.o. male with PMH significant for CLL, HTN, Hypothyroidism, hx of CVA, GERD, COPD/asthma, CKD stage 3 and severe hearing impared/deafness; who presented to ED with complaints of SOB and hypoxia. Patient seen 5 days ago, positive COVID-19 and further symptoms progression, now hypoxic to mid 80's on RA. No fever, but positive chills and general malaise. No CP, nausea, vomiting, diarrhea, hematuria, dysuria, focal weakness or any other complaints.   In ED, patient's CXR demonstrated LLL infiltrates, WBC's 114.9K, stable electrolytes and renal function. [place] 2L applied with O2 sat on 93%. Antibiotics given and preliminary labs for [place] admission taken. [facility] contacted for admission and further management.   Past Medical/Surgical History: Past Medical History:  Diagnosis Date  . Allergy   . Arthritis   . Asthma   . Cancer ([place])    kidney  . CLL (chronic lymphocytic leukemia) ([place]) 2016  . COPD (chronic obstructive pulmonary disease) ([place])   . COVID-19   . Diverticulitis   . Diverticulosis    colonoscopy 3/15  . GERD (gastroesophageal reflux disease)   . Hearing impaired   . Hypertension   . Low testosterone   . Migraine   . Shingles 03/31/2016  . Solar purpura ([place])   . Thyroid disease    hypothryoid  . Ulcer   . Ventral hernia FEB 2015 CT    Large infra umbilical ventral hernia containing loops of small    Past Surgical History:  Procedure Laterality Date  . abdominal mesh inserted  june 2006, may 2008,dec 2010  . CHOLECYSTECTOMY  june 2007  . COLON SURGERY  sept 2007  . COLONOSCOPY    . COLONOSCOPY N/A 02/20/2014   Procedure: COLONOSCOPY;  Surgeon: Danie Binder, MD;  Location[facility];  Service: Endoscopy;  Laterality:  N/A;  . HERNIA REPAIR  05/2005 5/20108, 11/2009   with mesh  . KIDNEY CYST REMOVAL  jan 2005   renal cell carcinoma  . knee minescus repair Left feb 2007  . NEPHRECTOMY Left 12/2003    Social History:  reports that he quit smoking about 22 years ago. He has never used smokeless tobacco. He reports current alcohol use. He reports that he does not use drugs.  Allergies: Allergies  Allergen Reactions  . Contrast Media [Iodinated Diagnostic Agents]     Single functioning kidney  . Other Hives    Neoprene fabric Strawberry   . Ether Rash    Took a long time to wake up Doesn't wake up from it    Family History:  Family History  Problem Relation Age of Onset  . Cancer Mother        lung, met to brain  . Diabetes Mellitus II Sister   . Colon cancer Neg Hx     Prior to Admission medications   Medication Sig Start Date End Date Taking? Authorizing Provider  albuterol (PROVENTIL) (2.5 MG/3ML) 0.083% nebulizer solution Take 3 mLs (2.5 mg total) by nebulization every 2 (two) hours as needed for wheezing or shortness of breath. 08/28/18  Yes [place], Modena Nunnery, MD  allopurinol (ZYLOPRIM) 100 MG tablet TAKE 1 TABLET(100 MG) BY MOUTH AT BEDTIME 05/22/19  Yes [place], Modena Nunnery, MD  amLODipine (NORVASC) 10 MG tablet TAKE 1 TABLET BY  MOUTH DAILY 05/22/19  Yes [place], Modena Nunnery, MD  ATROVENT HFA 17 MCG/ACT inhaler INHALE 2 PUFFS BY MOUTH INTO THE LUNGS EVERY 6 HOURS Patient taking differently: Inhale 2 puffs into the lungs every 6 (six) hours as needed for wheezing.  11/08/16  Yes [place], Modena Nunnery, MD  calcium carbonate (TUMS - DOSED IN MG ELEMENTAL CALCIUM) 500 MG chewable tablet Chew 1,000 mg by mouth at bedtime. Reported on 11/21/2015   Yes [provider]  cholecalciferol (VITAMIN D3) 25 MCG (1000 UT) tablet Take 1,000 Units by mouth every morning.   Yes [provider]  cimetidine (TAGAMET) 200 MG tablet Take 200 mg by mouth at bedtime.   Yes [provider]   citalopram (CELEXA) 10 MG tablet TAKE 1 TABLET(10 MG) BY MOUTH DAILY 04/27/19  Yes [place], Modena Nunnery, MD  clopidogrel (PLAVIX) 75 MG tablet TAKE 1 TABLET(75 MG) BY MOUTH DAILY 04/10/19  Yes [place], Modena Nunnery, MD  diphenhydrAMINE (BENADRYL) 25 MG tablet Take 25 mg by mouth daily as needed for allergies.    Yes [provider]  folic acid (FOLVITE) 1 MG tablet Take 1 tablet (1 mg total) by mouth daily. 05/22/18  Yes Derek Jack, MD  Glucosamine-Chondroit-Vit C-Mn (GLUCOSAMINE 1500 COMPLEX) CAPS Take 1,200 capsules by mouth 2 (two) times daily.   Yes [provider]  HYDROcodone-acetaminophen (NORCO) 10-325 MG tablet Take 1-2 tablets by mouth every 8 (eight) hours as needed. 05/28/19  Yes [place], Modena Nunnery, MD  levothyroxine (SYNTHROID) 50 MCG tablet TAKE 1 TABLET(50 MCG) BY MOUTH DAILY BEFORE BREAKFAST 05/22/19  Yes [place], Modena Nunnery, MD  metoprolol tartrate (LOPRESSOR) 50 MG tablet TAKE 1 TABLET(50 MG) BY MOUTH TWICE DAILY 05/22/19  Yes [place], Modena Nunnery, MD  mometasone (ASMANEX 60 METERED DOSES) 220 MCG/INH inhaler Inhale 2 puffs into the lungs 2 (two) times daily. 08/08/15  Yes [place], Modena Nunnery, MD  Multiple Vitamin (MULTIVITAMIN WITH MINERALS) TABS tablet Take 1 tablet by mouth daily.   Yes [provider]  neomycin-bacitracin-polymyxin (NEOSPORIN) ointment Apply 1 application topically as needed for wound care.   Yes [provider]  Omega-3 Fatty Acids (FISH OIL) 1000 MG CPDR Take 1 capsule by mouth 2 (two) times daily.    Yes [provider]  prednisoLONE 5 MG TABS tablet Take 5 mg by mouth daily.   Yes [provider]  vitamin B-12 (CYANOCOBALAMIN) 1000 MCG tablet Take 1,000 mcg by mouth every morning.    Yes [provider]  vitamin C (ASCORBIC ACID) 500 MG tablet Take 500 mg by mouth 2 (two) times daily.   Yes [provider]  vitamin E 400 UNIT capsule Take 400 Units by mouth every morning.    Yes [provider]  acetaminophen (TYLENOL) 325 MG tablet Take 650 mg by mouth every 6 (six) hours as needed.    [provider]  sulfamethoxazole-trimethoprim (BACTRIM DS,SEPTRA DS) 800-160 MG tablet Take 1 tablet by mouth every Monday, Wednesday, and Friday. 12/08/18   [place]ie [place], NP-C    Review of Systems:  Negative except as otherwise mentioned on HPI. But Caveat level 5 apply given hx of deafness.    Physical Exam: Vitals:   05/14/2019 1443 05/17/2019 1500 05/24/2019 1501 06/04/2019 1530  BP:  (!) 110/42 (!) 110/42 (!) 110/45  Pulse:   76 79  Resp:  (!) 22 (!) 24 (!) 22  Temp: 98.4 F (36.9 C)     TempSrc: Oral     SpO2:  94% 95%  Weight:      Height:        Constitutional: afebrile, mild resp distress, using [place] supplementation, no CP. Eyes: PERRL, lids and conjunctivae normal ENMT: Mucous membranes are moist. Posterior pharynx clear of any exudate or lesions. Neck: normal, supple, no masses, no thyromegaly, no JVD Respiratory: positive rhonchi, no crackles, no using accessory muscles. Normal respiratory effort. No accessory muscle use.positive tachypnea.   Cardiovascular: Regular rate and rhythm, no murmurs / rubs / gallops. No extremity edema. 2+ pedal pulses. No carotid bruits.  Abdomen: Obese, no tenderness, no masses palpated. No hepatosplenomegaly. Bowel sounds positive.  Musculoskeletal: no clubbing / cyanosis. No joint deformity upper and lower extremities. Good ROM, no contractures. Normal muscle tone.  Skin: no rashes, lesions, ulcers. No induration Neurologic: CN 2-12 grossly intact. Sensation intact, DTR normal. Strength 5/5 in all 4.  Psychiatric: Normal judgment and insight. Alert and oriented x 3. Normal mood.    Labs on Admission: I have personally reviewed the following labs and imaging studies  CBC: Recent Labs  Lab 05/18/2019 1330  WBC 114.9*  NEUTROABS 6.1  HGB 8.8*  HCT 30.0*  MCV 99.7  PLT 606*   Basic Metabolic Panel: Recent Labs  Lab 05/20/2019 1330   NA 133*  K 4.4  CL 101  CO2 19*  GLUCOSE 144*  BUN 27*  CREATININE 1.88*  CALCIUM 9.2   GFR: Estimated Creatinine Clearance: 46.8 mL/min (A) (by C-G formula based on SCr of 1.88 mg/dL (H)).   Liver Function Tests: Recent Labs  Lab 06/01/2019 1330  AST 16  ALT 17  ALKPHOS 57  BILITOT 0.8  PROT 6.2*  ALBUMIN 3.8   Cardiac Enzymes: Recent Labs  Lab 05/14/2019 1330  CKTOTAL 35*   Urine analysis:    Component Value Date/Time   COLORURINE YELLOW 04/04/2018 1507   APPEARANCEUR CLEAR 04/04/2018 1507   LABSPEC 1.025 04/04/2018 1507   PHURINE 5.5 04/04/2018 1507   GLUCOSEU NEGATIVE 04/04/2018 1507   HGBUR NEGATIVE 04/04/2018 1507   BILIRUBINUR NEGATIVE 03/09/2017 0105   KETONESUR NEGATIVE 04/04/2018 1507   PROTEINUR TRACE (A) 04/04/2018 1507   UROBILINOGEN 0.2 05/21/2014 2340   NITRITE NEGATIVE 04/04/2018 1507   LEUKOCYTESUR NEGATIVE 04/04/2018 1507    Recent Results (from the past 240 hour(s))  Novel Coronavirus, NAA ([facility])     Status: Abnormal   Collection Time: 05/28/19 10:36 AM  Result Value Ref Range Status   SARS-CoV-2, NAA Detected (A) Not Detected Final    Comment: Testing was performed using the cobas(R) SARS-CoV-2 test. This test was developed and its performance characteristics determined by [employer]. This test has not been FDA cleared or approved. This test has been authorized by FDA under an Emergency Use Authorization (EUA). This test is only authorized for the duration of time the declaration that circumstances exist justifying the authorization of the emergency use of in vitro diagnostic tests for detection of SARS-CoV-2 virus and/or diagnosis of COVID-19 infection under section 564(b)(1) of the Act, 21 U.S.C. 301SWF-0(X)(3), unless the authorization is terminated or revoked sooner. When diagnostic testing is negative, the possibility of a false negative result should be considered in the context of a patient's recent exposures and the  presence of clinical signs and symptoms consistent with COVID-19. An individual without symptoms of COVID-19 and who is not shedding SARS-CoV-2 virus would expect to have a negati ve (not detected) result in this assay.      Radiological Exams on Admission: Dg Chest [facility]  1 View  Result Date: 05/25/2019 CLINICAL DATA:  Shortness of breath and cough EXAM: PORTABLE CHEST 1 VIEW COMPARISON:  June 16, 2016. FINDINGS: There is an area of ill-defined opacity adjacent to the left heart border which is suspicious for an area of pneumonia. Lungs elsewhere clear. Heart is upper normal in size with pulmonary vascularity normal. No adenopathy. No bone lesions. IMPRESSION: Ill-defined opacity adjacent to the left heart border suspicious for pneumonia. Lungs elsewhere clear. Heart upper normal in size. Followup PA and lateral chest radiographs recommended in 3-4 weeks following trial of antibiotic therapy to ensure resolution and exclude underlying malignancy. Electronically Signed   By: Lowella Grip III M.D.   On: 05/25/2019 14:05    EKG: none   Assessment/Plan 1-Pneumonia due to COVID-19 virus -with elevated CRP, LLL infiltrates on CXR and hypoxia on RA -93% on 2L [place] -started on IV steroids -Redemsivir to be started at [facility] -follow D-dimer and rest of admission protocol labs -normal LFT's  2-Hypothyroidism -continue synthroid   3-CKD (chronic kidney disease), stage III (HCC) -stable and with Cr at baseline by GFR -Cr 1.7--1.8 at baseline -follow renal function trend   4-COPD with asthma ([place]) -no wheezing currently -SOB most likely from COVID-19 -will use steroids as mentioned above, continue PRN respimat and continue mometasone.  5-GERD (gastroesophageal reflux disease) -continue PPI  6-Essential hypertension, benign -will continue metoprolol -holding norvasc initially as BP is stable/soft. -follow vital signs -gentle IVF's for 15 hours -heart healthy diet ordered   7-Depression -continue celexa -no SI or hallucinations   8-CLL (chronic lymphocytic leukemia) (HCC) -appears stable -continue steroids -continue outpatient follow up with Dr. Tera Helper   9-History of CVA (cerebrovascular accident) -no new deficits -will continue plavix for secondary prevention   10-morbid obesity -Body mass index is 40.36 kg/m. -low calorie diet, portion control and increase activity as part of lifestyle changes discussed with patient.  DVT prophylaxis: heparin  Code Status:  Full Family Communication:  No family at bedside; Wife updated by Phone.  Disposition Plan:  Will admit to [facility] campus for further management and treatment of COVID-19 PNA. Consults called:  None  Admission status:  Inpatient, LOS > 2 midnights, Progressive unit.    Time Spent: 70 minutes PPE use: N95, Goggles, face shield, gown and gloves.   Barton Dubois MD [facility] Pager [phone]  05/12/2019, 3:57 PM

## 2019-06-02 NOTE — ED Notes (Signed)
[facility] to transport pt 

## 2019-06-02 NOTE — ED Triage Notes (Signed)
Pt brought from home by ems for shortness of breath. Tested positive for covid a few days ago. Sats were found to be around 70 by ems on room air.

## 2019-06-02 NOTE — ED Notes (Signed)
Date and time results received: 05/20/2019 1400 (use smartphrase ".now" to insert current time)  Test: wbc Critical Value: 114.9  Name of Provider Notified: dr Sabra Heck  Orders Received? Or Actions Taken?: see chart

## 2019-06-02 NOTE — ED Provider Notes (Signed)
[facility] EMERGENCY DEPARTMENT Provider Note   CSN: [id number] Arrival date & time: 05/29/2019  1307    History   Chief Complaint Chief Complaint  Patient presents with  . Shortness of Breath    HPI [name] Ball is a 68 y.o. male.     HPI  The patient is an obese 68 year old male with multiple medical problems including a history of kidney cancer, chronic lymphocytic leukemia, COPD and recently diagnosed with COVID-19 as of 22 June.  Apparently everybody in the family has had the same infection, he has been told to monitor his oxygen levels over the last couple of days and has noted today that his oxygen was down in the 70% range.  Paramedics arrived to find the patient in respiratory distress with oxygen in the mid to upper 80% and placed him on 2 L and then 3 L of nasal cannula which rose to the mid 90% range.  He remains tachypneic and complains of some chest discomfort.  He has not had objective fevers but has had some hot and cold spells.  He has had some vomiting and diarrhea last night but has not had that today.  His respiratory symptoms are persistent, they became much worse today.  Past Medical History:  Diagnosis Date  . Allergy   . Arthritis   . Asthma   . Cancer ([place])    kidney  . CLL (chronic lymphocytic leukemia) ([place]) 2016  . COPD (chronic obstructive pulmonary disease) ([place])   . COVID-19   . Diverticulitis   . Diverticulosis    colonoscopy 3/15  . GERD (gastroesophageal reflux disease)   . Hearing impaired   . Hypertension   . Low testosterone   . Migraine   . Shingles 03/31/2016  . Solar purpura ([place])   . Thyroid disease    hypothryoid  . Ulcer   . Ventral hernia FEB 2015 CT    Large infra umbilical ventral hernia containing loops of small    Patient Active Problem List   Diagnosis Date Noted  . Unilateral primary osteoarthritis, right knee 02/07/2019  . Chronic pain of right knee 11/22/2018  . History of CVA (cerebrovascular accident)  03/18/2017  . Complicated migraine 83/41/9622  . Asthma 03/09/2017  . Hypogammaglobulinemia ([place]) 01/27/2016  . CLL (chronic lymphocytic leukemia) ([place]) 11/10/2015  . Prediabetes 04/08/2015  . Depression 12/10/2014  . Low testosterone 12/10/2014  . Essential hypertension, benign 08/09/2014  . Sleep apnea 08/09/2014  . Onychomycosis 08/09/2014  . Diverticulosis of colon with hemorrhage 02/20/2014  . GI bleed 02/18/2014  . Recurrent ventral incisional hernia 01/21/2014  . Abdominal wall hernia 11/19/2013  . Hand dermatitis 09/18/2013  . Hypothyroidism 07/17/2013  . CKD (chronic kidney disease), stage III ([place]) 07/17/2013  . Obesity 07/17/2013  . Chronic pain syndrome 07/17/2013  . COPD with asthma ([place]) 07/17/2013  . GERD (gastroesophageal reflux disease) 07/17/2013    Past Surgical History:  Procedure Laterality Date  . abdominal mesh inserted  june 2006, may 2008,dec 2010  . CHOLECYSTECTOMY  june 2007  . COLON SURGERY  sept 2007  . COLONOSCOPY    . COLONOSCOPY N/A 02/20/2014   Procedure: COLONOSCOPY;  Surgeon: Danie Binder, MD;  Location: [facility] ENDO SUITE;  Service: Endoscopy;  Laterality: N/A;  . HERNIA REPAIR  05/2005 5/20108, 11/2009   with mesh  . KIDNEY CYST REMOVAL  jan 2005   renal cell carcinoma  . knee minescus repair Left feb 2007  . NEPHRECTOMY Left 12/2003  Home Medications    Prior to Admission medications   Medication Sig Start Date End Date Taking? Authorizing Provider  albuterol (PROVENTIL) (2.5 MG/3ML) 0.083% nebulizer solution Take 3 mLs (2.5 mg total) by nebulization every 2 (two) hours as needed for wheezing or shortness of breath. 08/28/18  Yes [place], Modena Nunnery, MD  allopurinol (ZYLOPRIM) 100 MG tablet TAKE 1 TABLET(100 MG) BY MOUTH AT BEDTIME 05/22/19  Yes [place], Modena Nunnery, MD  amLODipine (NORVASC) 10 MG tablet TAKE 1 TABLET BY MOUTH DAILY 05/22/19  Yes [place], Modena Nunnery, MD  ATROVENT HFA 17 MCG/ACT inhaler INHALE 2 PUFFS BY MOUTH INTO THE  LUNGS EVERY 6 HOURS Patient taking differently: Inhale 2 puffs into the lungs every 6 (six) hours as needed for wheezing.  11/08/16  Yes [place], Modena Nunnery, MD  calcium carbonate (TUMS - DOSED IN MG ELEMENTAL CALCIUM) 500 MG chewable tablet Chew 1,000 mg by mouth at bedtime. Reported on 11/21/2015   Yes [provider]  cholecalciferol (VITAMIN D3) 25 MCG (1000 UT) tablet Take 1,000 Units by mouth every morning.   Yes [provider]  cimetidine (TAGAMET) 200 MG tablet Take 200 mg by mouth at bedtime.   Yes [provider]  citalopram (CELEXA) 10 MG tablet TAKE 1 TABLET(10 MG) BY MOUTH DAILY 04/27/19  Yes [place], Modena Nunnery, MD  clopidogrel (PLAVIX) 75 MG tablet TAKE 1 TABLET(75 MG) BY MOUTH DAILY 04/10/19  Yes [place], Modena Nunnery, MD  diphenhydrAMINE (BENADRYL) 25 MG tablet Take 25 mg by mouth daily as needed for allergies.    Yes [provider]  folic acid (FOLVITE) 1 MG tablet Take 1 tablet (1 mg total) by mouth daily. 05/22/18  Yes Derek Jack, MD  Glucosamine-Chondroit-Vit C-Mn (GLUCOSAMINE 1500 COMPLEX) CAPS Take 1,200 capsules by mouth 2 (two) times daily.   Yes [provider]  HYDROcodone-acetaminophen (NORCO) 10-325 MG tablet Take 1-2 tablets by mouth every 8 (eight) hours as needed. 05/28/19  Yes [place], Modena Nunnery, MD  levothyroxine (SYNTHROID) 50 MCG tablet TAKE 1 TABLET(50 MCG) BY MOUTH DAILY BEFORE BREAKFAST 05/22/19  Yes [place], Modena Nunnery, MD  metoprolol tartrate (LOPRESSOR) 50 MG tablet TAKE 1 TABLET(50 MG) BY MOUTH TWICE DAILY 05/22/19  Yes [place], Modena Nunnery, MD  mometasone (ASMANEX 60 METERED DOSES) 220 MCG/INH inhaler Inhale 2 puffs into the lungs 2 (two) times daily. 08/08/15  Yes , Modena Nunnery, MD  Multiple Vitamin (MULTIVITAMIN WITH MINERALS) TABS tablet Take 1 tablet by mouth daily.   Yes [provider]  neomycin-bacitracin-polymyxin (NEOSPORIN) ointment Apply 1 application topically as needed for wound care.   Yes [provider]  Omega-3 Fatty Acids (FISH OIL) 1000 MG CPDR Take 1 capsule by mouth 2 (two) times daily.    Yes [provider]  prednisoLONE 5 MG TABS tablet Take 5 mg by mouth daily.   Yes [provider]  vitamin B-12 (CYANOCOBALAMIN) 1000 MCG tablet Take 1,000 mcg by mouth every morning.    Yes [provider]  vitamin C (ASCORBIC ACID) 500 MG tablet Take 500 mg by mouth 2 (two) times daily.   Yes [provider]  vitamin E 400 UNIT capsule Take 400 Units by mouth every morning.    Yes [provider]  acetaminophen (TYLENOL) 325 MG tablet Take 650 mg by mouth every 6 (six) hours as needed.    [provider]  sulfamethoxazole-trimethoprim (BACTRIM DS,SEPTRA DS) 800-160 MG tablet Take 1 tablet by mouth every Monday, Wednesday, and Friday. 12/08/18  [place]ie [place], NP-C    Family History Family History  Problem Relation Age of Onset  . Cancer Mother        lung, met to brain  . Diabetes Mellitus II Sister   . Colon cancer Neg Hx     Social History Social History   Tobacco Use  . Smoking status: Former Smoker    Quit date: 12/06/1996    Years since quitting: 22.5  . Smokeless tobacco: Never Used  Substance Use Topics  . Alcohol use: Yes    Comment: a few shots a few times a month  . Drug use: No     Allergies   Contrast media [iodinated diagnostic agents], Other, and Ether   Review of Systems Review of Systems  All other systems reviewed and are negative.    Physical Exam Updated Vital Signs BP (!) 110/42 (BP Location: Right Arm)   Pulse 76   Temp 98.4 F (36.9 C) (Oral)   Resp (!) 24   Ht 1.727 m (5' 8" )   Wt 120.4 kg   SpO2 94%   BMI 40.36 kg/m   Physical Exam Vitals signs and nursing note reviewed.  Constitutional:      General: He is in acute distress.     Appearance: He is well-developed. He is ill-appearing.  HENT:     Head: Normocephalic and atraumatic.     Mouth/Throat:     Pharynx: No  oropharyngeal exudate.  Eyes:     General: No scleral icterus.       Right eye: No discharge.        Left eye: No discharge.     Conjunctiva/sclera: Conjunctivae normal.     Pupils: Pupils are equal, round, and reactive to light.  Neck:     Musculoskeletal: Normal range of motion and neck supple.     Thyroid: No thyromegaly.     Vascular: No JVD.  Cardiovascular:     Rate and Rhythm: Normal rate and regular rhythm.     Heart sounds: Normal heart sounds. No murmur. No friction rub. No gallop.   Pulmonary:     Effort: Respiratory distress present.     Breath sounds: Examination of the right-lower field reveals rales. Examination of the left-lower field reveals rales. Rales present. No wheezing.  Chest:     Chest wall: No mass, tenderness or edema.  Abdominal:     General: Bowel sounds are normal. There is no distension.     Palpations: Abdomen is soft. There is no mass.     Tenderness: There is no abdominal tenderness.  Musculoskeletal: Normal range of motion.        General: No tenderness.  Lymphadenopathy:     Cervical: No cervical adenopathy.  Skin:    General: Skin is warm and dry.     Findings: No erythema or rash.  Neurological:     Mental Status: He is alert.     Coordination: Coordination normal.  Psychiatric:        Behavior: Behavior normal.      ED Treatments / Results  Labs (all labs ordered are listed, but only abnormal results are displayed) Labs Reviewed  CBC WITH DIFFERENTIAL/PLATELET - Abnormal; Notable for the following components:      Result Value   WBC 114.9 (*)    RBC 3.01 (*)    Hemoglobin 8.8 (*)    HCT 30.0 (*)    MCHC 29.3 (*)    Platelets 129 (*)  Lymphs Abs 91.4 (*)    Monocytes Absolute 16.7 (*)    Basophils Absolute 0.4 (*)    Abs Immature Granulocytes 0.31 (*)    All other components within normal limits  COMPREHENSIVE METABOLIC PANEL - Abnormal; Notable for the following components:   Sodium 133 (*)    CO2 19 (*)    Glucose,  Bld 144 (*)    BUN 27 (*)    Creatinine, Ser 1.88 (*)    Total Protein 6.2 (*)    GFR calc non Af Amer 36 (*)    GFR calc Af Amer 41 (*)    All other components within normal limits  C-REACTIVE PROTEIN - Abnormal; Notable for the following components:   CRP 18.1 (*)    All other components within normal limits  CK - Abnormal; Notable for the following components:   Total CK 35 (*)    All other components within normal limits  TROPONIN I (HIGH SENSITIVITY)  LACTATE DEHYDROGENASE  FERRITIN    EKG None  Radiology Dg Chest Port 1 View  Result Date: 05/13/2019 CLINICAL DATA:  Shortness of breath and cough EXAM: PORTABLE CHEST 1 VIEW COMPARISON:  June 16, 2016. FINDINGS: There is an area of ill-defined opacity adjacent to the left heart border which is suspicious for an area of pneumonia. Lungs elsewhere clear. Heart is upper normal in size with pulmonary vascularity normal. No adenopathy. No bone lesions. IMPRESSION: Ill-defined opacity adjacent to the left heart border suspicious for pneumonia. Lungs elsewhere clear. Heart upper normal in size. Followup PA and lateral chest radiographs recommended in 3-4 weeks following trial of antibiotic therapy to ensure resolution and exclude underlying malignancy. Electronically Signed   By: Lowella Grip III M.D.   On: 05/07/2019 14:05    Procedures Procedures (including critical care time)  Medications Ordered in ED Medications  vancomycin (VANCOCIN) IVPB 1000 mg/200 mL premix (1,000 mg Intravenous New Bag/Given 05/24/2019 1440)  piperacillin-tazobactam (ZOSYN) IVPB 3.375 g (3.375 g Intravenous New Bag/Given 05/11/2019 1441)  acetaminophen (TYLENOL) tablet 1,000 mg (1,000 mg Oral Given 06/03/2019 1338)  albuterol (VENTOLIN HFA) 108 (90 Base) MCG/ACT inhaler 2 puff (2 puffs Inhalation Given 06/03/2019 1455)  aerochamber Z-Stat Plus/medium 1 each (1 each Other Given 06/01/2019 1455)     Initial Impression / Assessment and Plan / ED Course  I have  reviewed the triage vital signs and the nursing notes.  Pertinent labs & imaging results that were available during my care of the patient were reviewed by me and considered in my medical decision making (see chart for details).        The patient is ill-appearing with hypoxia tachypnea and increased work of breathing.  I suspect this is related to coronavirus however given his chest pain that is started today with a shortness of breath we will obtain a troponin EKG and a chest x-ray.  He may need to be admitted to the [facility] for coronavirus pneumonia.  He is afebrile by mouth.  CXR has L basilar pneumonia -blood counts are very elevated consistent with prior leukocytosis secondary to underlying leukemia.  Discussed with the hospitalist who will admit for ongoing hypoxia and oxygen requirement, the patient does have pneumonia on the x-ray, antibiotics were ordered, he will need to be transferred most likely to the [facility].  Final Clinical Impressions(s) / ED Diagnoses   Final diagnoses:  Pneumonia due to COVID-19 virus    ED Discharge Orders    None  Noemi Chapel, MD 05/25/2019 [phone]

## 2019-06-02 NOTE — ED Notes (Addendum)
Wife notifed of pending transfer

## 2019-06-03 ENCOUNTER — Inpatient Hospital Stay (HOSPITAL_COMMUNITY): Payer: Medicare Other

## 2019-06-03 LAB — COMPREHENSIVE METABOLIC PANEL
ALT: 19 U/L (ref 0–44)
AST: 17 U/L (ref 15–41)
Albumin: 3.7 g/dL (ref 3.5–5.0)
Alkaline Phosphatase: 58 U/L (ref 38–126)
Anion gap: 11 (ref 5–15)
BUN: 32 mg/dL — ABNORMAL HIGH (ref 8–23)
CO2: 21 mmol/L — ABNORMAL LOW (ref 22–32)
Calcium: 8.9 mg/dL (ref 8.9–10.3)
Chloride: 105 mmol/L (ref 98–111)
Creatinine, Ser: 1.69 mg/dL — ABNORMAL HIGH (ref 0.61–1.24)
GFR calc Af Amer: 47 mL/min — ABNORMAL LOW (ref >60)
GFR calc non Af Amer: 41 mL/min — ABNORMAL LOW (ref >60)
Glucose, Bld: 185 mg/dL — ABNORMAL HIGH (ref 70–99)
Potassium: 5 mmol/L (ref 3.5–5.1)
Sodium: 137 mmol/L (ref 135–145)
Total Bilirubin: 0.6 mg/dL (ref 0.3–1.2)
Total Protein: 6.5 g/dL (ref 6.5–8.1)

## 2019-06-03 LAB — CBC WITH DIFFERENTIAL/PLATELET
Abs Immature Granulocytes: 0.27 10*3/uL — ABNORMAL HIGH (ref 0.00–0.07)
Basophils Absolute: 0.1 10*3/uL (ref 0.0–0.1)
Basophils Relative: 0 %
Eosinophils Absolute: 0 10*3/uL (ref 0.0–0.5)
Eosinophils Relative: 0 %
HCT: 29.6 % — ABNORMAL LOW (ref 39.0–52.0)
Hemoglobin: 8.7 g/dL — ABNORMAL LOW (ref 13.0–17.0)
Immature Granulocytes: 0 %
Lymphocytes Relative: 82 %
Lymphs Abs: 99.6 10*3/uL — ABNORMAL HIGH (ref 0.7–4.0)
MCH: 29.4 pg (ref 26.0–34.0)
MCHC: 29.4 g/dL — ABNORMAL LOW (ref 30.0–36.0)
MCV: 100 fL (ref 80.0–100.0)
Monocytes Absolute: 16.2 10*3/uL — ABNORMAL HIGH (ref 0.1–1.0)
Monocytes Relative: 13 %
Neutro Abs: 6.3 10*3/uL (ref 1.7–7.7)
Neutrophils Relative %: 5 %
Platelets: 134 10*3/uL — ABNORMAL LOW (ref 150–400)
RBC: 2.96 MIL/uL — ABNORMAL LOW (ref 4.22–5.81)
RDW: 14.5 % (ref 11.5–15.5)
WBC: 122.4 10*3/uL (ref 4.0–10.5)
nRBC: 0 % (ref 0.0–0.2)

## 2019-06-03 LAB — D-DIMER, QUANTITATIVE: D-Dimer, Quant: 0.71 ug/mL-FEU — ABNORMAL HIGH (ref 0.00–0.50)

## 2019-06-03 LAB — MAGNESIUM: Magnesium: 2.5 mg/dL — ABNORMAL HIGH (ref 1.7–2.4)

## 2019-06-03 LAB — STREP PNEUMONIAE URINARY ANTIGEN: Strep Pneumo Urinary Antigen: NEGATIVE

## 2019-06-03 LAB — FERRITIN: Ferritin: 54 ng/mL (ref 24–336)

## 2019-06-03 LAB — PHOSPHORUS: Phosphorus: 4 mg/dL (ref 2.5–4.6)

## 2019-06-03 LAB — C-REACTIVE PROTEIN: CRP: 19.2 mg/dL — ABNORMAL HIGH (ref <1.0)

## 2019-06-03 MED ORDER — METHYLPREDNISOLONE SODIUM SUCC 125 MG IJ SOLR
60.0000 mg | Freq: Two times a day (BID) | INTRAMUSCULAR | Status: DC
  Administered 2019-06-03 – 2019-06-05 (×5): 60 mg via INTRAVENOUS
  Filled 2019-06-03 (×5): qty 2

## 2019-06-03 MED ORDER — GUAIFENESIN-DM 100-10 MG/5ML PO SYRP
10.0000 mL | ORAL_SOLUTION | ORAL | Status: DC | PRN
  Administered 2019-06-03 – 2019-06-14 (×8): 10 mL via ORAL
  Filled 2019-06-03 (×9): qty 10

## 2019-06-03 MED ORDER — HYDROCODONE-ACETAMINOPHEN 5-325 MG PO TABS
2.0000 | ORAL_TABLET | Freq: Three times a day (TID) | ORAL | Status: DC | PRN
  Administered 2019-06-03 – 2019-06-15 (×18): 2 via ORAL
  Filled 2019-06-03 (×19): qty 2

## 2019-06-03 NOTE — Progress Notes (Signed)
CRITICAL VALUE ALERT  Critical Value:  WBC   Date & Time Notied:  06/03/2019 at 1000   Provider Notified: Oren Binet, MD  Orders Received/Actions taken: no action required, chronic elevated WBC, CLL

## 2019-06-03 NOTE — Plan of Care (Signed)
  Problem: Education: Goal: Knowledge of General Education information will improve Description: Including pain rating scale, medication(s)/side effects and non-pharmacologic comfort measures 06/03/2019 0744 by Shanon Ace, RN Outcome: Progressing 06/03/2019 0744 by Shanon Ace, RN Outcome: Progressing   Problem: Activity: Goal: Risk for activity intolerance will decrease 06/03/2019 0744 by Shanon Ace, RN Outcome: Progressing 06/03/2019 0744 by Shanon Ace, RN Outcome: Progressing   Problem: Pain Managment: Goal: General experience of comfort will improve 06/03/2019 0744 by Shanon Ace, RN Outcome: Progressing 06/03/2019 0744 by Shanon Ace, RN Outcome: Progressing   Problem: Safety: Goal: Ability to remain free from injury will improve 06/03/2019 0744 by Shanon Ace, RN Outcome: Progressing 06/03/2019 0744 by Shanon Ace, RN Outcome: Progressing   Problem: Skin Integrity: Goal: Risk for impaired skin integrity will decrease 06/03/2019 0744 by Shanon Ace, RN Outcome: Progressing 06/03/2019 0744 by Shanon Ace, RN Outcome: Progressing   Problem: Education: Goal: Knowledge of risk factors and measures for prevention of condition will improve Outcome: Progressing   Problem: Coping: Goal: Psychosocial and spiritual needs will be supported Outcome: Progressing   Problem: Respiratory: Goal: Will maintain a patent airway Outcome: Progressing Goal: Complications related to the disease process, condition or treatment will be avoided or minimized Outcome: Progressing

## 2019-06-03 NOTE — Plan of Care (Signed)
  Problem: Education: Goal: Knowledge of General Education information will improve Description: Including pain rating scale, medication(s)/side effects and non-pharmacologic comfort measures 06/03/2019 0744 by Shanon Ace, RN Outcome: Progressing 06/03/2019 0744 by Shanon Ace, RN Outcome: Progressing 06/03/2019 0744 by Shanon Ace, RN Outcome: Progressing   Problem: Activity: Goal: Risk for activity intolerance will decrease 06/03/2019 0744 by Shanon Ace, RN Outcome: Progressing 06/03/2019 0744 by Shanon Ace, RN Outcome: Progressing 06/03/2019 0744 by Shanon Ace, RN Outcome: Progressing   Problem: Pain Managment: Goal: General experience of comfort will improve 06/03/2019 0744 by Shanon Ace, RN Outcome: Progressing 06/03/2019 0744 by Shanon Ace, RN Outcome: Progressing 06/03/2019 0744 by Shanon Ace, RN Outcome: Progressing   Problem: Safety: Goal: Ability to remain free from injury will improve 06/03/2019 0744 by Shanon Ace, RN Outcome: Progressing 06/03/2019 0744 by Shanon Ace, RN Outcome: Progressing 06/03/2019 0744 by Shanon Ace, RN Outcome: Progressing   Problem: Skin Integrity: Goal: Risk for impaired skin integrity will decrease 06/03/2019 0744 by Shanon Ace, RN Outcome: Progressing 06/03/2019 0744 by Shanon Ace, RN Outcome: Progressing 06/03/2019 0744 by Shanon Ace, RN Outcome: Progressing   Problem: Education: Goal: Knowledge of risk factors and measures for prevention of condition will improve 06/03/2019 0744 by Shanon Ace, RN Outcome: Progressing 06/03/2019 0744 by Shanon Ace, RN Outcome: Progressing   Problem: Coping: Goal: Psychosocial and spiritual needs will be supported 06/03/2019 0744 by Shanon Ace, RN Outcome: Progressing 06/03/2019 0744 by Shanon Ace, RN Outcome: Progressing   Problem: Respiratory: Goal: Will maintain a patent airway 06/03/2019 0744 by Shanon Ace, RN Outcome: Progressing 06/03/2019  0744 by Shanon Ace, RN Outcome: Progressing Goal: Complications related to the disease process, condition or treatment will be avoided or minimized 06/03/2019 0744 by Shanon Ace, RN Outcome: Progressing 06/03/2019 0744 by Shanon Ace, RN Outcome: Progressing

## 2019-06-03 NOTE — Progress Notes (Addendum)
PROGRESS NOTE                                                                                                                                                                                                             Patient Demographics:    [name] Ball, is a 68 y.o. male, DOB - 05/31/1951, NFA:[id number]  Outpatient Primary MD for the patient is [facility], [place] Nunnery, MD    LOS - 1  Chief Complaint  Patient presents with  . Shortness of Breath       Brief Narrative: Patient is a 68 y.o. male with PMHx of CLL, HTN, Hypothyroidism, hx of CVA, GERD, COPD/asthma, CKD stage 3 and severe hearing impared/deafness-presented to the hospital with SOB-found to have acute hypoxic resp failure secondary to COVID 19 PNA.   Subjective:    [name] Ball today feels better-remains on 2L of O2. No CP, SOB, Nausea or vomiting.   Note-video interpreter/sign language used   Assessment  & Plan :   Acute Hypoxic Resp Failure due to Covid 19 Viral pneumonia: remains stable and on 2 L of O2. Continue Steroids and Remdesivir.Inflammatory markers remain significantly elevated. Given clinical stability do not think patient requires Actemra/Plasma at this time.   COVID-19 Labs:  Recent Labs    06/01/2019 1330 06/03/19 0528  DDIMER  --  0.71*  FERRITIN 272 54  LDH 128  --   CRP 18.1* 19.2*    Lab Results  Component Value Date   SARSCOV2NAA Detected (A) 05/28/2019     COVID-19 Medications: 6/27>> Solumedrol 6/27>>Remdesivir  Hypothyroidism:continue synthroid   CKD (chronic kidney disease), stage III ([place]): remains at baseline-follow  COPD/Asthma: no exacerbation-continue bronchodilators.  GERD (gastroesophageal reflux disease):continue PPI  Essential hypertension:controlled-continue metoprolol-resume Norvasc over the next few days,  Depression:continue celexa,no SI or hallucinations   CLL (chronic lymphocytic  leukemia): stable-has significant leukocytosis at baseline. Follow with Oncology on discharge.   Autoimmune hemolytic anemia related to CLL: Hemoglobin stable-on steroids.  History of CVA (cerebrovascular accident):no new focal deficits-continue Plavix  OSA: on CPAP at home-unable to resume during this hospitalization due to hospital protocol. On O2.  Morbid obesity  Severe hearing loss/Deafness  Jehovah's Witness: Refuses all blood work    ABG:    Component Value Date/Time   TCO2 25 03/08/2017 2353    Condition -Stable  Family  Communication  : Spoke with spouse and daughter over the phone  Code Status :  Full Code  Diet :  Diet Order            Diet Heart Room service appropriate? Yes; Fluid consistency: Thin  Diet effective now               Disposition Plan  :  Remain inpatient   DVT Prophylaxis  :   Heparin  Lab Results  Component Value Date   PLT 134 (L) 06/03/2019    Inpatient Medications  Scheduled Meds: . citalopram  10 mg Oral Daily  . clopidogrel  75 mg Oral Daily  . fluticasone  2 puff Inhalation BID  . folic acid  1 mg Oral Daily  . heparin  5,000 Units Subcutaneous Q8H  . Ipratropium-Albuterol  1 puff Inhalation Q6H  . levothyroxine  50 mcg Oral Q0600  . methylPREDNISolone (SOLU-MEDROL) injection  60 mg Intravenous Q12H  . metoprolol tartrate  12.5 mg Oral BID  . pantoprazole  40 mg Oral Daily  . [START ON 06/04/2019] sulfamethoxazole-trimethoprim  1 tablet Oral Q M,W,F   Continuous Infusions: . remdesivir 100 mg in NS 250 mL     PRN Meds:.acetaminophen, HYDROcodone-acetaminophen, ondansetron **OR** ondansetron (ZOFRAN) IV  Antibiotics  :    Anti-infectives (From admission, onward)   Start     Dose/Rate Route Frequency Ordered Stop   06/04/19 1000  sulfamethoxazole-trimethoprim (BACTRIM DS) 800-160 MG per tablet 1 tablet     1 tablet Oral Every M-W-F 05/17/2019 1551     06/03/19 2100  remdesivir 100 mg in sodium chloride 0.9 % 250  mL IVPB     100 mg 500 mL/hr over 30 Minutes Intravenous Every 24 hours 05/19/2019 1956 06/07/19 2059   05/24/2019 2100  remdesivir 200 mg in sodium chloride 0.9 % 250 mL IVPB     200 mg 500 mL/hr over 30 Minutes Intravenous Once 05/07/2019 1956 05/28/2019 2315   05/13/2019 1430  vancomycin (VANCOCIN) IVPB 1000 mg/200 mL premix     1,000 mg 200 mL/hr over 60 Minutes Intravenous  Once 05/18/2019 1422 05/21/2019 1547   05/12/2019 1430  piperacillin-tazobactam (ZOSYN) IVPB 3.375 g     3.375 g 100 mL/hr over 30 Minutes Intravenous  Once 05/27/2019 1422 05/29/2019 1514       Time Spent in minutes  25   Oren Binet M.D on 06/03/2019 at 10:52 AM  To page go to [email or web address] - use universal password  [facility] -  Office  [phone]   See all Orders from today for further details   Admit date - 05/21/2019    1    Objective:   Vitals:   06/03/19 0400 06/03/19 0700 06/03/19 0909 06/03/19 1000  BP: 118/64 137/62 (!) 143/57 (!) 122/59  Pulse: 78 81 89 82  Resp: 13 (!) 21 (!) 23 16  Temp:   97.6 F (36.4 C)   TempSrc:   Oral   SpO2: 100% 98%  98%  Weight:      Height:        Wt Readings from Last 3 Encounters:  05/15/2019 114.4 kg  05/16/19 120.4 kg  04/12/19 120.7 kg     Intake/Output Summary (Last 24 hours) at 06/03/2019 1052 Last data filed at 06/03/2019 0500 Gross per 24 hour  Intake 1539.73 ml  Output 650 ml  Net 889.73 ml     Physical Exam Gen Exam:Alert awake-not in any distress HEENT:atraumatic, normocephalic Chest: B/L clear to  auscultation anteriorly CVS:S1S2 regular Abdomen:soft non tender, non distended Extremities:no edema Neurology:Non focal Skin: no rash   Data Review:    CBC Recent Labs  Lab 05/16/2019 1330 06/03/19 0528  WBC 114.9* 122.4*  HGB 8.8* 8.7*  HCT 30.0* 29.6*  PLT 129* 134*  MCV 99.7 100.0  MCH 29.2 29.4  MCHC 29.3* 29.4*  RDW 14.7 14.5  LYMPHSABS 91.4* 99.6*  MONOABS 16.7* 16.2*  EOSABS 0.0 0.0  BASOSABS 0.4* 0.1     Chemistries  Recent Labs  Lab 05/26/2019 1330 06/03/19 0528  NA 133* 137  K 4.4 5.0  CL 101 105  CO2 19* 21*  GLUCOSE 144* 185*  BUN 27* 32*  CREATININE 1.88* 1.69*  CALCIUM 9.2 8.9  MG  --  2.5*  AST 16 17  ALT 17 19  ALKPHOS 57 58  BILITOT 0.8 0.6   ------------------------------------------------------------------------------------------------------------------ No results for input(s): CHOL, HDL, LDLCALC, TRIG, CHOLHDL, LDLDIRECT in the last 72 hours.  Lab Results  Component Value Date   HGBA1C 5.2 01/12/2019   ------------------------------------------------------------------------------------------------------------------ No results for input(s): TSH, T4TOTAL, T3FREE, THYROIDAB in the last 72 hours.  Invalid input(s): FREET3 ------------------------------------------------------------------------------------------------------------------ Recent Labs    05/23/2019 1330 06/03/19 0528  FERRITIN 272 54    Coagulation profile No results for input(s): INR, PROTIME in the last 168 hours.  Recent Labs    06/03/19 0528  DDIMER 0.71*    Cardiac Enzymes No results for input(s): CKMB, TROPONINI, MYOGLOBIN in the last 168 hours.  Invalid input(s): CK ------------------------------------------------------------------------------------------------------------------ No results found for: BNP  Micro Results Recent Results (from the past 240 hour(s))  Novel Coronavirus, NAA ([facility])     Status: Abnormal   Collection Time: 05/28/19 10:36 AM  Result Value Ref Range Status   SARS-CoV-2, NAA Detected (A) Not Detected Final    Comment: Testing was performed using the cobas(R) SARS-CoV-2 test. This test was developed and its performance characteristics determined by [employer]. This test has not been FDA cleared or approved. This test has been authorized by FDA under an Emergency Use Authorization (EUA). This test is only authorized for the duration of time the  declaration that circumstances exist justifying the authorization of the emergency use of in vitro diagnostic tests for detection of SARS-CoV-2 virus and/or diagnosis of COVID-19 infection under section 564(b)(1) of the Act, 21 U.S.C. 527POE-4(M)(3), unless the authorization is terminated or revoked sooner. When diagnostic testing is negative, the possibility of a false negative result should be considered in the context of a patient's recent exposures and the presence of clinical signs and symptoms consistent with COVID-19. An individual without symptoms of COVID-19 and who is not shedding SARS-CoV-2 virus would expect to have a negati ve (not detected) result in this assay.     Radiology Reports Dg Chest Port 1 View  Result Date: 06/03/2019 CLINICAL DATA:  Shortness of breath EXAM: PORTABLE CHEST 1 VIEW COMPARISON:  Chest x-rays dated 06/03/2019 and 2020/04/2616. FINDINGS: Persistent opacity at the LEFT lung base. Additional opacity now noted within the periphery of the LEFT mid lung. Chronic opacities noted at the RIGHT lung base. No pneumothorax seen. Stable cardiomegaly. IMPRESSION: 1. Persistent opacity at the LEFT lung base. Additional opacity now noted within the periphery of the LEFT mid lung. Findings are suspicious for multifocal pneumonia. Consider chest CT for more definitive characterization. 2. Cardiomegaly. Electronically Signed   By: Franki Cabot M.D.   On: 06/03/2019 08:02   Dg Chest Port 1 View  Result Date: 05/07/2019 CLINICAL DATA:  Shortness of breath and cough EXAM: PORTABLE CHEST 1 VIEW COMPARISON:  June 16, 2016. FINDINGS: There is an area of ill-defined opacity adjacent to the left heart border which is suspicious for an area of pneumonia. Lungs elsewhere clear. Heart is upper normal in size with pulmonary vascularity normal. No adenopathy. No bone lesions. IMPRESSION: Ill-defined opacity adjacent to the left heart border suspicious for pneumonia. Lungs elsewhere clear.  Heart upper normal in size. Followup PA and lateral chest radiographs recommended in 3-4 weeks following trial of antibiotic therapy to ensure resolution and exclude underlying malignancy. Electronically Signed   By: Lowella Grip III M.D.   On: 05/28/2019 14:05

## 2019-06-03 NOTE — Progress Notes (Signed)
Updated Pt's wife this afternoon. Advised that Pt was weaning down on his O2 demand - was now on 2 L, he was up walking around in the room and tolerating activity well. He continue with treatment,  Wife advised if there were any issues to call her and she would talk with Pt.

## 2019-06-03 NOTE — Progress Notes (Signed)
Notified spouse of progress.  All questions were answered and this nurse's contact number shared for further communication.   

## 2019-06-04 LAB — COMPREHENSIVE METABOLIC PANEL
ALT: 18 U/L (ref 0–44)
AST: 18 U/L (ref 15–41)
Albumin: 3.5 g/dL (ref 3.5–5.0)
Alkaline Phosphatase: 56 U/L (ref 38–126)
Anion gap: 9 (ref 5–15)
BUN: 41 mg/dL — ABNORMAL HIGH (ref 8–23)
CO2: 20 mmol/L — ABNORMAL LOW (ref 22–32)
Calcium: 9.1 mg/dL (ref 8.9–10.3)
Chloride: 110 mmol/L (ref 98–111)
Creatinine, Ser: 1.58 mg/dL — ABNORMAL HIGH (ref 0.61–1.24)
GFR calc Af Amer: 51 mL/min — ABNORMAL LOW (ref >60)
GFR calc non Af Amer: 44 mL/min — ABNORMAL LOW (ref >60)
Glucose, Bld: 178 mg/dL — ABNORMAL HIGH (ref 70–99)
Potassium: 5.4 mmol/L — ABNORMAL HIGH (ref 3.5–5.1)
Sodium: 139 mmol/L (ref 135–145)
Total Bilirubin: 0.2 mg/dL — ABNORMAL LOW (ref 0.3–1.2)
Total Protein: 6.3 g/dL — ABNORMAL LOW (ref 6.5–8.1)

## 2019-06-04 LAB — CBC WITH DIFFERENTIAL/PLATELET
Abs Immature Granulocytes: 0.32 10*3/uL — ABNORMAL HIGH (ref 0.00–0.07)
Basophils Absolute: 0.2 10*3/uL — ABNORMAL HIGH (ref 0.0–0.1)
Basophils Relative: 0 %
Eosinophils Absolute: 0 10*3/uL (ref 0.0–0.5)
Eosinophils Relative: 0 %
HCT: 29.9 % — ABNORMAL LOW (ref 39.0–52.0)
Hemoglobin: 8.7 g/dL — ABNORMAL LOW (ref 13.0–17.0)
Immature Granulocytes: 0 %
Lymphocytes Relative: 81 %
Lymphs Abs: 90.5 10*3/uL — ABNORMAL HIGH (ref 0.7–4.0)
MCH: 29.1 pg (ref 26.0–34.0)
MCHC: 29.1 g/dL — ABNORMAL LOW (ref 30.0–36.0)
MCV: 100 fL (ref 80.0–100.0)
Monocytes Absolute: 14.6 10*3/uL — ABNORMAL HIGH (ref 0.1–1.0)
Monocytes Relative: 13 %
Neutro Abs: 6.7 10*3/uL (ref 1.7–7.7)
Neutrophils Relative %: 6 %
Platelets: 149 10*3/uL — ABNORMAL LOW (ref 150–400)
RBC: 2.99 MIL/uL — ABNORMAL LOW (ref 4.22–5.81)
RDW: 14.4 % (ref 11.5–15.5)
WBC: 112.3 10*3/uL (ref 4.0–10.5)
nRBC: 0 % (ref 0.0–0.2)

## 2019-06-04 LAB — C-REACTIVE PROTEIN: CRP: 11 mg/dL — ABNORMAL HIGH (ref <1.0)

## 2019-06-04 LAB — FERRITIN: Ferritin: 501 ng/mL — ABNORMAL HIGH (ref 24–336)

## 2019-06-04 LAB — PHOSPHORUS: Phosphorus: 4.1 mg/dL (ref 2.5–4.6)

## 2019-06-04 LAB — D-DIMER, QUANTITATIVE: D-Dimer, Quant: 0.39 ug/mL-FEU (ref 0.00–0.50)

## 2019-06-04 LAB — PATHOLOGIST SMEAR REVIEW

## 2019-06-04 LAB — MAGNESIUM: Magnesium: 2.5 mg/dL — ABNORMAL HIGH (ref 1.7–2.4)

## 2019-06-04 MED ORDER — SODIUM ZIRCONIUM CYCLOSILICATE 10 G PO PACK
10.0000 g | PACK | Freq: Once | ORAL | Status: AC
  Administered 2019-06-04: 10 g via ORAL
  Filled 2019-06-04: qty 1

## 2019-06-04 NOTE — Evaluation (Signed)
Occupational Therapy Evaluation Patient Details Name: [name] Ball MRN: [id number] DOB: 1951-10-26 Today's Date: 06/04/2019    History of Present Illness Patient is a 68 y.o. male with PMHx of CLL, HTN, Hypothyroidism, hx of CVA(left thalamic infarct), GERD, COPD/asthma, CKD stage 3 and severe hearing impared/deafness-presented to the hospital with SOB-found to have acute hypoxic resp failure secondary to COVID 19 PNA.    Clinical Impression   PTA Pt mod I for mobility with SPC and ADL with shower seat and grab bars. Pt typically independent for bathing/dressing although his wife helps him PRN for things like socks (he uses slip on shoes). Pt has about 10% of hearing so [employer] ASL interpreter utilized throughout session (Brandi [id number]) Pt required cues to look at screen for interpretation throughout session. Pt was able to don socks long sitting in the bed and perform grooming/UB ADL with set up from seated position. Pt required min guard A for short ambulation around bed to recliner with brief drop in O2 saturations (initially Pt was 1 L [place] 92%, 85 HR 16 RR - and went to 87% 1 L HR 87 RR24) with  In room ambulation. PT will have assist from his wife, children, and grandchildren at discharge. Recommend 24 hour supervision initially but anticipate that he will not need OT follow up. Next session plan on HEP for UB (limited in mobility by pain in his R knee) and energy conservation education.     Follow Up Recommendations  No OT follow up;Supervision/Assistance - 24 hour(initially)    Equipment Recommendations  3 in 1 bedside commode    Recommendations for Other Services       Precautions / Restrictions Precautions Precautions: Fall Restrictions Weight Bearing Restrictions: No      Mobility Bed Mobility Overal bed mobility: Needs Assistance Bed Mobility: Supine to Sit     Supine to sit: Supervision;HOB elevated     General bed mobility comments: supervision for  safety  Transfers Overall transfer level: Needs assistance Equipment used: Straight cane Transfers: Sit to/from Stand Sit to Stand: Min guard         General transfer comment: utilized momentum for rise from EOB, min guard for safety    Balance Overall balance assessment: Mild deficits observed, not formally tested                                         ADL either performed or assessed with clinical judgement   ADL Overall ADL's : Needs assistance/impaired Eating/Feeding: Independent   Grooming: Set up;Sitting   Upper Body Bathing: Minimal assistance   Lower Body Bathing: Min guard;Sitting/lateral leans   Upper Body Dressing : Minimal assistance   Lower Body Dressing: Minimal assistance Lower Body Dressing Details (indicate cue type and reason): able to don socks long sitting in bed, however he requires min A for sit<>stand Toilet Transfer: Min guard;+2 for safety/equipment;Ambulation(SPC) Toilet Transfer Details (indicate cue type and reason): walking around bed to recliner (simulated) Pt limited by pain in his knee Toileting- Clothing Manipulation and Hygiene: Min guard   Tub/ Shower Transfer: Min guard;Ambulation;Shower seat(SPC)   Functional mobility during ADLs: Min guard;+2 for safety/equipment;Cane General ADL Comments: O2 levels dropped to 87% from 92% on 1LO2     Vision Baseline Vision/History: Wears glasses Wears Glasses: At all times Patient Visual Report: No change from baseline Vision Assessment?: No apparent visual deficits  Perception     Praxis      Pertinent Vitals/Pain Pain Assessment: 0-10 Pain Score: 9  Pain Location: R knee Pain Descriptors / Indicators: Constant;Discomfort Pain Intervention(s): Premedicated before session;Repositioned     Hand Dominance Left   Extremity/Trunk Assessment Upper Extremity Assessment Upper Extremity Assessment: Generalized weakness   Lower Extremity Assessment Lower Extremity  Assessment: Defer to PT evaluation       Communication Communication Communication: HOH(used ASL Brandi #[id number] through [employer])   Cognition Arousal/Alertness: Awake/alert Behavior During Therapy: WFL for tasks assessed/performed Overall Cognitive Status: Impaired/Different from baseline Area of Impairment: Attention;Safety/judgement;Awareness                   Current Attention Level: Sustained     Safety/Judgement: Decreased awareness of deficits;Decreased awareness of safety Awareness: Emergent   General Comments: Pt is a jokester, likes to laugh - very sweet - however he answered some questions incorrectly (i.e. do you live in a house or an apt? and he would answer" I have many stairs".) Unsure if related to hearing or cognition   General Comments       Exercises     Shoulder Instructions      Home Living Family/patient expects to be discharged to:: Private residence Living Arrangements: Spouse/significant other;Children;Other relatives(daughter, son, grandchildren) Available Help at Discharge: Family;Available 24 hours/day Type of Home: House Home Access: Stairs to enter;Ramped entrance(process of building a ramp currently) Entrance Stairs-Number of Steps: 3 Entrance Stairs-Rails: Right Home Layout: Multi-level;Able to live on main level with bedroom/bathroom Alternate Level Stairs-Number of Steps: flight   Bathroom Shower/Tub: Occupational psychologist: Standard     Home Equipment: Cane - single point;Grab bars - toilet;Shower seat;Grab bars - tub/shower          Prior Functioning/Environment Level of Independence: Independent        Comments: I can usually do things, but if not, my wife helps me        OT Problem List: Decreased activity tolerance;Impaired balance (sitting and/or standing);Decreased safety awareness;Cardiopulmonary status limiting activity;Obesity;Pain      OT Treatment/Interventions:      OT Goals(Current goals  can be found in the care plan section) Acute Rehab OT Goals Patient Stated Goal: To get well and home to grandchildren OT Goal Formulation: With patient Time For Goal Achievement: 06/18/19 Potential to Achieve Goals: Good ADL Goals Pt Will Perform Grooming: with supervision;standing Pt Will Perform Upper Body Dressing: with modified independence;sitting Pt Will Perform Lower Body Dressing: with min guard assist;sit to/from stand Pt Will Transfer to Toilet: with supervision;ambulating Pt Will Perform Toileting - Clothing Manipulation and hygiene: with modified independence;sit to/from stand Pt/caregiver will Perform Home Exercise Program: Both right and left upper extremity;With theraband;Independently;With written HEP provided Additional ADL Goal #1: Pt will recall and implement 3 energy conservation techniques during ADL routine at independent level  OT Frequency: Min 2X/week   Barriers to D/C:            Co-evaluation PT/OT/SLP Co-Evaluation/Treatment: Yes Reason for Co-Treatment: For patient/therapist safety PT goals addressed during session: Mobility/safety with mobility OT goals addressed during session: ADL's and self-care;Proper use of Adaptive equipment and DME      AM-PAC OT "6 Clicks" Daily Activity     Outcome Measure Help from another person eating meals?: None Help from another person taking care of personal grooming?: A Little Help from another person toileting, which includes using toliet, bedpan, or urinal?: A Little Help from another person bathing (  including washing, rinsing, drying)?: A Little Help from another person to put on and taking off regular upper body clothing?: A Little Help from another person to put on and taking off regular lower body clothing?: A Lot 6 Click Score: 18   End of Session Equipment Utilized During Treatment: Gait belt;Oxygen(1LO2 via [place]; SPC) Nurse Communication: Mobility status  Activity Tolerance: Patient limited by pain(in R  knee) Patient left: in chair;with call bell/phone within reach;with chair alarm set  OT Visit Diagnosis: Unsteadiness on feet (R26.81);Muscle weakness (generalized) (M62.81);Other (comment);Pain(decreased activity tolerance, respiratory status) Pain - Right/Left: Right Pain - part of body: Knee                Time: 8483-5075 OT Time Calculation (min): 30 min Charges:  OT General Charges $OT Visit: 1 Visit OT Evaluation $OT Eval Moderate Complexity: Loraine OTR/L Acute Rehabilitation Services Pager: [phone] Office: [place] 06/04/2019, 1:45 PM

## 2019-06-04 NOTE — Progress Notes (Signed)
Pt c/o of dizziness around 1430 this afternoon. He advised that he started coughing, and then the dizziness started.. It lasted until approximately 1700.  MD was notified at approximately 1445, after the Pt advised this RN.  VS have been stable.  Pt has been sleeping off and on during this time period, he has been arousallable .  Will continue to monitor him I spoke to the Pt's wife at approximately 1745 to update her, she advised that she has been talking to the MD this day as well, a couple of times, and has been updated. She indicated that the Pt does have pain with right knee if he does PT and does not have his knee brace, which he does not have.  Advised of the above incident today. She advised that the Pt had an inner ear issue that come with congestion, that possibly that could have contributed to the dizziness - which if he was coughing, could of cause pressure in his ear. I advised that the Pt was up with PT/OT today, and he did get in a nap for approximately 2-3 hours. And he was sitting up to eat when I last saw him.Marland Kitchen

## 2019-06-04 NOTE — Evaluation (Addendum)
Physical Therapy Evaluation Patient Details Name: [name] Ball MRN: [id number] DOB: [birth year] Today's Date: 06/04/2019   History of Present Illness  Patient is a 68 y.o. male with PMHx of CLL, HTN, Hypothyroidism, hx of CVA(left thalamic infarct), GERD, COPD/asthma, CKD stage 3 and severe hearing impared/deafness-presented to the hospital with SOB-found to have acute hypoxic resp failure secondary to COVID 19 PNA.   Clinical Impression  The patient  Reports right knee pain, limiting mobility. Pt. Used SPC and geld onto bed. Patient most likely could benefit from a Rw for improved safety but declined. Continue to assess gait safety. Pt admitted with above diagnosis. Pt currently with functional limitations due to the deficits listed below (see PT Problem List).Pt will benefit from skilled PT to increase their independence and safety with mobility to allow discharge to the venue listed below.       Follow Up Recommendations Home health PT    Equipment Recommendations  Rolling walker with 5" wheels    Recommendations for Other Services       Precautions / Restrictions Precautions Precautions: Fall Restrictions Weight Bearing Restrictions: No      Mobility  Bed Mobility Overal bed mobility: Needs Assistance Bed Mobility: Supine to Sit     Supine to sit: Supervision;HOB elevated     General bed mobility comments: supervision for safety  Transfers Overall transfer level: Needs assistance Equipment used: Straight cane Transfers: Sit to/from Stand Sit to Stand: Min guard         General transfer comment: utilized momentum for rise from EOB, min guard for safety  Ambulation/Gait Ambulation/Gait assistance: Min Web designer (Feet): 15 Feet Assistive device: Straight cane Gait Pattern/deviations: Step-to pattern;Trunk flexed Gait velocity: decr   General Gait Details: leaning on bed also with cane. Antalgic on right leg.  Stairs             Wheelchair Mobility    Modified Rankin (Stroke Patients Only)       Balance Overall balance assessment: Needs assistance Sitting-balance support: No upper extremity supported;Feet supported Sitting balance-Leahy Scale: Fair     Standing balance support: During functional activity;Single extremity supported Standing balance-Leahy Scale: Poor                               Pertinent Vitals/Pain Pain Assessment: 0-10 Pain Score: 9  Pain Location: R knee Pain Descriptors / Indicators: Constant;Discomfort Pain Intervention(s): RN gave pain meds during session;Monitored during session;Limited activity within patient's tolerance    Home Living Family/patient expects to be discharged to:: Private residence Living Arrangements: Spouse/significant other;Children;Other relatives(daughter, son, grandchildren) Available Help at Discharge: Family;Available 24 hours/day Type of Home: House Home Access: Stairs to enter;Ramped entrance(process of building a ramp currently) Entrance Stairs-Rails: Right Entrance Stairs-Number of Steps: 3 Home Layout: Multi-level;Able to live on main level with bedroom/bathroom Home Equipment: Cane - single point;Grab bars - toilet;Shower seat;Grab bars - tub/shower      Prior Function Level of Independence: Independent         Comments: I can usually do things, but if not, my wife helps me     Hand Dominance   Dominant Hand: Left    Extremity/Trunk Assessment   Upper Extremity Assessment Upper Extremity Assessment: Defer to OT evaluation    Lower Extremity Assessment Lower Extremity Assessment: Generalized weakness;RLE deficits/detail RLE Deficits / Details: decr knee extension, painful    Cervical / Trunk Assessment Cervical / Trunk Assessment:  Normal  Communication   Communication: HOH(used ASL Brandi #[id number] through [employer])  Cognition Arousal/Alertness: Awake/alert Behavior During Therapy: WFL for tasks  assessed/performed Overall Cognitive Status: Impaired/Different from baseline Area of Impairment: Attention;Safety/judgement;Awareness                   Current Attention Level: Sustained     Safety/Judgement: Decreased awareness of deficits;Decreased awareness of safety Awareness: Emergent   General Comments: Pt is a jokester, likes to laugh - very sweet - however he answered some questions incorrectly (i.e. do you live in a house or an apt? and he would answer" I have many stairs".) Unsure if related to hearing or cognition      General Comments      Exercises     Assessment/Plan    PT Assessment Patient needs continued PT services  PT Problem List Decreased strength;Decreased mobility;Decreased safety awareness;Decreased knowledge of precautions;Decreased activity tolerance;Cardiopulmonary status limiting activity;Decreased balance;Decreased knowledge of use of DME;Pain       PT Treatment Interventions DME instruction;Therapeutic activities;Gait training;Therapeutic exercise;Functional mobility training;Patient/family education    PT Goals (Current goals can be found in the Care Plan section)  Acute Rehab PT Goals Patient Stated Goal: To get well and home to grandchildren PT Goal Formulation: With patient Time For Goal Achievement: 06/18/19 Potential to Achieve Goals: Fair    Frequency Min 3X/week   Barriers to discharge        Co-evaluation PT/OT/SLP Co-Evaluation/Treatment: Yes Reason for Co-Treatment: For patient/therapist safety PT goals addressed during session: Mobility/safety with mobility OT goals addressed during session: ADL's and self-care;Proper use of Adaptive equipment and DME       AM-PAC PT "6 Clicks" Mobility  Outcome Measure Help needed turning from your back to your side while in a flat bed without using bedrails?: A Little Help needed moving from lying on your back to sitting on the side of a flat bed without using bedrails?: A  Little Help needed moving to and from a bed to a chair (including a wheelchair)?: A Lot Help needed standing up from a chair using your arms (e.g., wheelchair or bedside chair)?: A Lot Help needed to walk in hospital room?: A Lot Help needed climbing 3-5 steps with a railing? : Total 6 Click Score: 13    End of Session Equipment Utilized During Treatment: Gait belt;Oxygen Activity Tolerance: No increased pain Patient left: in chair;with call bell/phone within reach;with chair alarm set Nurse Communication: Mobility status PT Visit Diagnosis: Unsteadiness on feet (R26.81)    Time: 2878-6767 PT Time Calculation (min) (ACUTE ONLY): 36 min   Charges:  Low evaluation             Tresa Endo PT Acute Rehabilitation Services Pager [phone] Office [phone]   [name] 06/04/2019, 1:50 PM

## 2019-06-04 NOTE — Progress Notes (Signed)
Pt'sw WCB elevated, expected, chronic - Pt has CCL

## 2019-06-04 NOTE — Progress Notes (Addendum)
PROGRESS NOTE                                                                                                                                                                                                             Patient Demographics:    [name] Ball, is a 68 y.o. male, DOB - June 05, 1951, OVF:[id number]  Outpatient Primary MD for the patient is [facility], Modena Nunnery, MD    LOS - 2  Chief Complaint  Patient presents with  . Shortness of Breath       Brief Narrative: Patient is a 68 y.o. male with PMHx of CLL, HTN, Hypothyroidism, hx of CVA, GERD, COPD/asthma, CKD stage 3 and severe hearing impared/deafness-presented to the hospital with SOB-found to have acute hypoxic resp failure secondary to COVID 19 PNA.   Subjective:    [name] Ball today continues to have coughing spells-his breathing is stable-remains on just 2 L of oxygen.   Assessment  & Plan :   Acute Hypoxic Resp Failure due to Covid 19 Viral pneumonia: Remains essentially the same-remains on 2 L of oxygen, afebrile.  Continues to have coughing spells.  Continue steroids and Remdesivir.  Continue to follow inflammatory markers-ferritin has gone up, however CRP downtrending.  Given clinical stability-and minimal oxygen requirement-do not think patient requires Actemra plasma at this point    COVID-19 Labs:  Recent Labs    05/15/2019 1330 06/03/19 0528 06/04/19 0325  DDIMER  --  0.71* 0.39  FERRITIN 272 54 501*  LDH 128  --   --   CRP 18.1* 19.2* 11.0*    Lab Results  Component Value Date   SARSCOV2NAA Detected (A) 05/28/2019     COVID-19 Medications: 6/27>> Solumedrol 6/27>>Remdesivir  Hyperkalemia: Monitor closely-mild for now-we will give 1 dose of Lokelma.  Patient is on Bactrim.  Hypothyroidism: Continue Synthroid  CKD (chronic kidney disease), stage III ([place]): Remains at baseline-follow  COPD/Asthma: no exacerbation-continue  bronchodilators.  GERD (gastroesophageal reflux disease):continue PPI  Essential hypertension:controlled-continue metoprolol-resume Norvasc over the next few days,  Depression:continue celexa,no SI or hallucinations   CLL (chronic lymphocytic leukemia): stable-has significant leukocytosis at baseline. Follow with Oncology on discharge.   Autoimmune hemolytic anemia related to CLL: Hemoglobin stable-on steroids.  History of CVA (cerebrovascular accident):no new focal deficits-continue Plavix  OSA: on CPAP at home-unable to resume during this hospitalization  due to hospital protocol. On O2.  Morbid obesity  Severe hearing loss/Deafness  Jehovah's Witness: Refuses all blood work    ABG:    Component Value Date/Time   TCO2 25 03/08/2017 2353    Condition -Stable  Family Communication  : Unable to reach patient's spouse-updated daughter (daughter answered spouse's phone)  Code Status :  Full Code  Diet :  Diet Order            Diet Heart Room service appropriate? Yes; Fluid consistency: Thin  Diet effective now               Disposition Plan  :  Remain inpatient   DVT Prophylaxis  :   Heparin  Lab Results  Component Value Date   PLT 149 (L) 06/04/2019    Inpatient Medications  Scheduled Meds: . citalopram  10 mg Oral Daily  . clopidogrel  75 mg Oral Daily  . fluticasone  2 puff Inhalation BID  . folic acid  1 mg Oral Daily  . heparin  5,000 Units Subcutaneous Q8H  . Ipratropium-Albuterol  1 puff Inhalation Q6H  . levothyroxine  50 mcg Oral Q0600  . methylPREDNISolone (SOLU-MEDROL) injection  60 mg Intravenous Q12H  . metoprolol tartrate  12.5 mg Oral BID  . pantoprazole  40 mg Oral Daily  . sulfamethoxazole-trimethoprim  1 tablet Oral Q M,W,F   Continuous Infusions: . remdesivir 100 mg in NS 250 mL 500 mL/hr at 06/04/19 0700   PRN Meds:.acetaminophen, guaiFENesin-dextromethorphan, HYDROcodone-acetaminophen, ondansetron **OR** ondansetron  (ZOFRAN) IV  Antibiotics  :    Anti-infectives (From admission, onward)   Start     Dose/Rate Route Frequency Ordered Stop   06/04/19 1000  sulfamethoxazole-trimethoprim (BACTRIM DS) 800-160 MG per tablet 1 tablet     1 tablet Oral Every M-W-F 06/01/2019 1551     06/03/19 2100  remdesivir 100 mg in sodium chloride 0.9 % 250 mL IVPB     100 mg 500 mL/hr over 30 Minutes Intravenous Every 24 hours 05/14/2019 1956 06/07/19 2059   05/09/2019 2100  remdesivir 200 mg in sodium chloride 0.9 % 250 mL IVPB     200 mg 500 mL/hr over 30 Minutes Intravenous Once 05/12/2019 1956 05/22/2019 2315   05/29/2019 1430  vancomycin (VANCOCIN) IVPB 1000 mg/200 mL premix     1,000 mg 200 mL/hr over 60 Minutes Intravenous  Once 06/03/2019 1422 05/15/2019 1547   05/20/2019 1430  piperacillin-tazobactam (ZOSYN) IVPB 3.375 g     3.375 g 100 mL/hr over 30 Minutes Intravenous  Once 05/29/2019 1422 05/08/2019 1514       Time Spent in minutes  25   Oren Binet M.D on 06/04/2019 at 1:26 PM  To page go to [email or web address] - use universal password  [facility] -  Office  [phone]   See all Orders from today for further details   Admit date - 05/29/2019    2    Objective:   Vitals:   06/04/19 0540 06/04/19 0753 06/04/19 0800 06/04/19 0900  BP: 134/64 (!) 121/56  128/80  Pulse: 83  100 88  Resp: 20  (!) 35   Temp:  97.9 F (36.6 C)    TempSrc:  Oral    SpO2: 92%  91% 91%  Weight:      Height:        Wt Readings from Last 3 Encounters:  05/30/2019 114.4 kg  05/16/19 120.4 kg  04/12/19 120.7 kg     Intake/Output Summary (Last 24  hours) at 06/04/2019 1326 Last data filed at 06/04/2019 0900 Gross per 24 hour  Intake 250 ml  Output 1200 ml  Net -950 ml     Physical Exam General appearance:Awake, alert, not in any distress.  Eyes:no scleral icterus. HEENT: Atraumatic and Normocephalic Neck: supple, no JVD. Resp:Good air entry bilaterally,no rales or rhonchi CVS: S1 S2 regular, no murmurs.  GI:  Bowel sounds present, Non tender and not distended with no gaurding, rigidity or rebound. Extremities: B/L Lower Ext shows no edema, both legs are warm to touch Neurology:  Non focal Psychiatric: Normal judgment and insight. Normal mood. Musculoskeletal:No digital cyanosis Skin:No Rash, warm and dry Wounds:N/A  Data Review:    CBC Recent Labs  Lab 05/07/2019 1330 06/03/19 0528 06/04/19 0325  WBC 114.9* 122.4* 112.3*  HGB 8.8* 8.7* 8.7*  HCT 30.0* 29.6* 29.9*  PLT 129* 134* 149*  MCV 99.7 100.0 100.0  MCH 29.2 29.4 29.1  MCHC 29.3* 29.4* 29.1*  RDW 14.7 14.5 14.4  LYMPHSABS 91.4* 99.6* 90.5*  MONOABS 16.7* 16.2* 14.6*  EOSABS 0.0 0.0 0.0  BASOSABS 0.4* 0.1 0.2*    Chemistries  Recent Labs  Lab 05/22/2019 1330 06/03/19 0528 06/04/19 0325  NA 133* 137 139  K 4.4 5.0 5.4*  CL 101 105 110  CO2 19* 21* 20*  GLUCOSE 144* 185* 178*  BUN 27* 32* 41*  CREATININE 1.88* 1.69* 1.58*  CALCIUM 9.2 8.9 9.1  MG  --  2.5* 2.5*  AST 16 17 18   ALT 17 19 18   ALKPHOS 57 58 56  BILITOT 0.8 0.6 0.2*   ------------------------------------------------------------------------------------------------------------------ No results for input(s): CHOL, HDL, LDLCALC, TRIG, CHOLHDL, LDLDIRECT in the last 72 hours.  Lab Results  Component Value Date   HGBA1C 5.2 01/12/2019   ------------------------------------------------------------------------------------------------------------------ No results for input(s): TSH, T4TOTAL, T3FREE, THYROIDAB in the last 72 hours.  Invalid input(s): FREET3 ------------------------------------------------------------------------------------------------------------------ Recent Labs    06/03/19 0528 06/04/19 0325  FERRITIN 54 501*    Coagulation profile No results for input(s): INR, PROTIME in the last 168 hours.  Recent Labs    06/03/19 0528 06/04/19 0325  DDIMER 0.71* 0.39    Cardiac Enzymes No results for input(s): CKMB, TROPONINI,  MYOGLOBIN in the last 168 hours.  Invalid input(s): CK ------------------------------------------------------------------------------------------------------------------ No results found for: BNP  Micro Results Recent Results (from the past 240 hour(s))  Novel Coronavirus, NAA ([facility])     Status: Abnormal   Collection Time: 05/28/19 10:36 AM  Result Value Ref Range Status   SARS-CoV-2, NAA Detected (A) Not Detected Final    Comment: Testing was performed using the cobas(R) SARS-CoV-2 test. This test was developed and its performance characteristics determined by [employer]. This test has not been FDA cleared or approved. This test has been authorized by FDA under an Emergency Use Authorization (EUA). This test is only authorized for the duration of time the declaration that circumstances exist justifying the authorization of the emergency use of in vitro diagnostic tests for detection of SARS-CoV-2 virus and/or diagnosis of COVID-19 infection under section 564(b)(1) of the Act, 21 U.S.C. 268TMH-9(Q)(2), unless the authorization is terminated or revoked sooner. When diagnostic testing is negative, the possibility of a false negative result should be considered in the context of a patient's recent exposures and the presence of clinical signs and symptoms consistent with COVID-19. An individual without symptoms of COVID-19 and who is not shedding SARS-CoV-2 virus would expect to have a negati ve (not detected) result in this assay.  Radiology Reports Dg Chest Port 1 View  Result Date: 06/03/2019 CLINICAL DATA:  Shortness of breath EXAM: PORTABLE CHEST 1 VIEW COMPARISON:  Chest x-rays dated 05/07/2019 and 11-15-2016. FINDINGS: Persistent opacity at the LEFT lung base. Additional opacity now noted within the periphery of the LEFT mid lung. Chronic opacities noted at the RIGHT lung base. No pneumothorax seen. Stable cardiomegaly. IMPRESSION: 1. Persistent opacity at the  LEFT lung base. Additional opacity now noted within the periphery of the LEFT mid lung. Findings are suspicious for multifocal pneumonia. Consider chest CT for more definitive characterization. 2. Cardiomegaly. Electronically Signed   By: Franki Cabot M.D.   On: 06/03/2019 08:02   Dg Chest Port 1 View  Result Date: 06/05/2019 CLINICAL DATA:  Shortness of breath and cough EXAM: PORTABLE CHEST 1 VIEW COMPARISON:  June 16, 2016. FINDINGS: There is an area of ill-defined opacity adjacent to the left heart border which is suspicious for an area of pneumonia. Lungs elsewhere clear. Heart is upper normal in size with pulmonary vascularity normal. No adenopathy. No bone lesions. IMPRESSION: Ill-defined opacity adjacent to the left heart border suspicious for pneumonia. Lungs elsewhere clear. Heart upper normal in size. Followup PA and lateral chest radiographs recommended in 3-4 weeks following trial of antibiotic therapy to ensure resolution and exclude underlying malignancy. Electronically Signed   By: Lowella Grip III M.D.   On: 05/30/2019 14:05

## 2019-06-04 NOTE — Telephone Encounter (Signed)
Pt notified via wife on Sat. Discussed red flags when to go to ER Pt deaf

## 2019-06-04 NOTE — Plan of Care (Signed)
Discussed with patient plan of care for the evening, pain management and using incentive spirometry with some teach back displayed.  Called and updated wife and answered any questions at this time per patient request.

## 2019-06-04 NOTE — Progress Notes (Signed)
Pt is not as drowsy or dizzy, he is more alert. He indicated around 1800 that he is feeling much better.Marland Kitchen He was sitting on the side of bed, eating.

## 2019-06-04 NOTE — Plan of Care (Signed)
  Problem: Education: Goal: Knowledge of General Education information will improve Description: Including pain rating scale, medication(s)/side effects and non-pharmacologic comfort measures Outcome: Progressing   Problem: Activity: Goal: Risk for activity intolerance will decrease Outcome: Progressing   Problem: Pain Managment: Goal: General experience of comfort will improve Outcome: Progressing   Problem: Safety: Goal: Ability to remain free from injury will improve Outcome: Progressing   Problem: Skin Integrity: Goal: Risk for impaired skin integrity will decrease Outcome: Progressing   Problem: Education: Goal: Knowledge of risk factors and measures for prevention of condition will improve Outcome: Progressing   Problem: Coping: Goal: Psychosocial and spiritual needs will be supported Outcome: Progressing   Problem: Respiratory: Goal: Will maintain a patent airway Outcome: Progressing Goal: Complications related to the disease process, condition or treatment will be avoided or minimized Outcome: Progressing

## 2019-06-05 LAB — CBC WITH DIFFERENTIAL/PLATELET
Abs Immature Granulocytes: 0.19 10*3/uL — ABNORMAL HIGH (ref 0.00–0.07)
Basophils Absolute: 0.3 10*3/uL — ABNORMAL HIGH (ref 0.0–0.1)
Basophils Relative: 0 %
Eosinophils Absolute: 0 10*3/uL (ref 0.0–0.5)
Eosinophils Relative: 0 %
HCT: 29.9 % — ABNORMAL LOW (ref 39.0–52.0)
Hemoglobin: 9 g/dL — ABNORMAL LOW (ref 13.0–17.0)
Immature Granulocytes: 0 %
Lymphocytes Relative: 83 %
Lymphs Abs: 67.5 10*3/uL — ABNORMAL HIGH (ref 0.7–4.0)
MCH: 29.9 pg (ref 26.0–34.0)
MCHC: 30.1 g/dL (ref 30.0–36.0)
MCV: 99.3 fL (ref 80.0–100.0)
Monocytes Absolute: 8.8 10*3/uL — ABNORMAL HIGH (ref 0.1–1.0)
Monocytes Relative: 11 %
Neutro Abs: 4.5 10*3/uL (ref 1.7–7.7)
Neutrophils Relative %: 6 %
Platelets: 127 10*3/uL — ABNORMAL LOW (ref 150–400)
RBC: 3.01 MIL/uL — ABNORMAL LOW (ref 4.22–5.81)
RDW: 14.5 % (ref 11.5–15.5)
WBC Morphology: ABNORMAL
WBC: 81.3 10*3/uL (ref 4.0–10.5)
nRBC: 0 % (ref 0.0–0.2)

## 2019-06-05 LAB — COMPREHENSIVE METABOLIC PANEL
ALT: 16 U/L (ref 0–44)
AST: 14 U/L — ABNORMAL LOW (ref 15–41)
Albumin: 3.4 g/dL — ABNORMAL LOW (ref 3.5–5.0)
Alkaline Phosphatase: 54 U/L (ref 38–126)
Anion gap: 10 (ref 5–15)
BUN: 51 mg/dL — ABNORMAL HIGH (ref 8–23)
CO2: 21 mmol/L — ABNORMAL LOW (ref 22–32)
Calcium: 9.2 mg/dL (ref 8.9–10.3)
Chloride: 107 mmol/L (ref 98–111)
Creatinine, Ser: 1.42 mg/dL — ABNORMAL HIGH (ref 0.61–1.24)
GFR calc Af Amer: 58 mL/min — ABNORMAL LOW (ref >60)
GFR calc non Af Amer: 50 mL/min — ABNORMAL LOW (ref >60)
Glucose, Bld: 153 mg/dL — ABNORMAL HIGH (ref 70–99)
Potassium: 5.4 mmol/L — ABNORMAL HIGH (ref 3.5–5.1)
Sodium: 138 mmol/L (ref 135–145)
Total Bilirubin: 0.4 mg/dL (ref 0.3–1.2)
Total Protein: 6.1 g/dL — ABNORMAL LOW (ref 6.5–8.1)

## 2019-06-05 LAB — FERRITIN: Ferritin: 490 ng/mL — ABNORMAL HIGH (ref 24–336)

## 2019-06-05 LAB — C-REACTIVE PROTEIN: CRP: 4.5 mg/dL — ABNORMAL HIGH (ref <1.0)

## 2019-06-05 LAB — D-DIMER, QUANTITATIVE: D-Dimer, Quant: 0.31 ug/mL-FEU (ref 0.00–0.50)

## 2019-06-05 MED ORDER — SODIUM ZIRCONIUM CYCLOSILICATE 10 G PO PACK
10.0000 g | PACK | Freq: Every day | ORAL | Status: AC
  Administered 2019-06-05: 10 g via ORAL
  Filled 2019-06-05: qty 1

## 2019-06-05 MED ORDER — METHYLPREDNISOLONE SODIUM SUCC 40 MG IJ SOLR
40.0000 mg | Freq: Two times a day (BID) | INTRAMUSCULAR | Status: DC
  Administered 2019-06-05 – 2019-06-10 (×10): 40 mg via INTRAVENOUS
  Filled 2019-06-05 (×10): qty 1

## 2019-06-05 NOTE — Progress Notes (Signed)
PROGRESS NOTE                                                                                                                                                                                                             Patient Demographics:    [name] Ball, is a 68 y.o. male, DOB - December 07, 1950, IWP:[id number]  Outpatient Primary MD for the patient is [facility], Modena Nunnery, MD    LOS - 3  Chief Complaint  Patient presents with  . Shortness of Breath       Brief Narrative: Patient is a 68 y.o. male with PMHx of CLL, HTN, Hypothyroidism, hx of CVA, GERD, COPD/asthma, CKD stage 3 and severe hearing impared/deafness-presented to the hospital with SOB-found to have acute hypoxic resp failure secondary to COVID 19 PNA.   Subjective:    [name] Ball was lying comfortably in bed-spoke with him to sign language interpreter via bedside iPad/video translator.  He denied any chest pain or shortness of breath-he claims he felt better.    Assessment  & Plan :   Acute Hypoxic Resp Failure due to Covid 19 Viral pneumonia: Stable-remains on just 2 L of oxygen this morning.  Cough seems to be better.  Continue steroids and Remdesivir-attempt to titrate down oxygen.  Mobilize with PT-out of bed to chair.  Inflammatory markers are downtrending.Given clinical stability-and minimal oxygen requirement-do not think patient requires Actemra plasma at this point    COVID-19 Labs:  Recent Labs    06/03/19 0528 06/04/19 0325 06/05/19 0322  DDIMER 0.71* 0.39 0.31  FERRITIN 54 501* 490*  CRP 19.2* 11.0* 4.5*    Lab Results  Component Value Date   SARSCOV2NAA Detected (A) 05/28/2019     COVID-19 Medications: 6/27>> Solumedrol 6/27>>Remdesivir  Hyperkalemia: Monitor closely-mild for now-we will give 1 dose of Lokelma.  Patient is on Bactrim (prophylaxis-as patient on chronic steroids for CLL).  Hypothyroidism: Continue Synthroid  CKD  (chronic kidney disease), stage III ([place]): Remains at baseline-follow  COPD/Asthma: no exacerbation-continue bronchodilators.  GERD (gastroesophageal reflux disease):continue PPI  Essential hypertension:controlled-continue metoprolol-resume Norvasc over the next few days,  Depression:continue celexa,no SI or hallucinations   CLL (chronic lymphocytic leukemia): stable-has significant leukocytosis at baseline. Follow with Oncology on discharge.  Is maintained on 5 mg of prednisolone daily-and on prophylactic Bactrim  Autoimmune hemolytic anemia related to CLL:  Hemoglobin stable-on steroids.  History of CVA (cerebrovascular accident):no new focal deficits-continue Plavix  OSA: on CPAP at home-unable to resume during this hospitalization due to hospital protocol. On O2.  Morbid obesity  Severe hearing loss/Deafness  Jehovah's Witness: Refuses all blood work    ABG:    Component Value Date/Time   TCO2 25 03/08/2017 2353    Condition -Stable  Family Communication  : Unable to reach patient's spouse-updated daughter (daughter answered spouse's phone)  Code Status :  Full Code  Diet :  Diet Order            Diet Heart Room service appropriate? Yes; Fluid consistency: Thin  Diet effective now               Disposition Plan  :  Remain inpatient   DVT Prophylaxis  :   Heparin  Lab Results  Component Value Date   PLT 127 (L) 06/05/2019    Inpatient Medications  Scheduled Meds: . citalopram  10 mg Oral Daily  . clopidogrel  75 mg Oral Daily  . fluticasone  2 puff Inhalation BID  . folic acid  1 mg Oral Daily  . heparin  5,000 Units Subcutaneous Q8H  . Ipratropium-Albuterol  1 puff Inhalation Q6H  . levothyroxine  50 mcg Oral Q0600  . methylPREDNISolone (SOLU-MEDROL) injection  60 mg Intravenous Q12H  . metoprolol tartrate  12.5 mg Oral BID  . pantoprazole  40 mg Oral Daily  . sulfamethoxazole-trimethoprim  1 tablet Oral Q M,W,F   Continuous Infusions:  . remdesivir 100 mg in NS 250 mL Stopped (06/04/19 2330)   PRN Meds:.acetaminophen, guaiFENesin-dextromethorphan, HYDROcodone-acetaminophen, ondansetron **OR** ondansetron (ZOFRAN) IV  Antibiotics  :    Anti-infectives (From admission, onward)   Start     Dose/Rate Route Frequency Ordered Stop   06/04/19 1000  sulfamethoxazole-trimethoprim (BACTRIM DS) 800-160 MG per tablet 1 tablet     1 tablet Oral Every M-W-F 05/26/2019 1551     06/03/19 2100  remdesivir 100 mg in sodium chloride 0.9 % 250 mL IVPB     100 mg 500 mL/hr over 30 Minutes Intravenous Every 24 hours 05/15/2019 1956 06/07/19 2059   05/08/2019 2100  remdesivir 200 mg in sodium chloride 0.9 % 250 mL IVPB     200 mg 500 mL/hr over 30 Minutes Intravenous Once 05/24/2019 1956 05/28/2019 2315   05/13/2019 1430  vancomycin (VANCOCIN) IVPB 1000 mg/200 mL premix     1,000 mg 200 mL/hr over 60 Minutes Intravenous  Once 05/08/2019 1422 05/20/2019 1547   05/13/2019 1430  piperacillin-tazobactam (ZOSYN) IVPB 3.375 g     3.375 g 100 mL/hr over 30 Minutes Intravenous  Once 05/16/2019 1422 05/30/2019 1514       Time Spent in minutes  25   Oren Binet M.D on 06/05/2019 at 1:35 PM  To page go to [email or web address] - use universal password  [facility] -  Office  [phone]   See all Orders from today for further details   Admit date - 05/21/2019    3    Objective:   Vitals:   06/05/19 0915 06/05/19 0935 06/05/19 1153 06/05/19 1321  BP: (!) 132/92  (!) 140/92   Pulse: 89 91 90 90  Resp: (!) 31  20 (!) 21  Temp: 97.9 F (36.6 C)     TempSrc: Oral     SpO2: (!) 87% 93% 92% 90%  Weight:      Height:        Wt  Readings from Last 3 Encounters:  05/22/2019 114.4 kg  05/16/19 120.4 kg  04/12/19 120.7 kg     Intake/Output Summary (Last 24 hours) at 06/05/2019 1335 Last data filed at 06/05/2019 1321 Gross per 24 hour  Intake 850 ml  Output 1751 ml  Net -901 ml     Physical Exam General appearance:Awake, alert, not in any  distress.  Eyes:no scleral icterus. HEENT: Atraumatic and Normocephalic Neck: supple, no JVD. Resp:Good air entry bilaterally,no rales or rhonchi CVS: S1 S2 regular, no murmurs.  GI: Bowel sounds present, Non tender and not distended with no gaurding, rigidity or rebound. Extremities: B/L Lower Ext shows no edema, both legs are warm to touch Neurology:  Non focal Psychiatric: Normal judgment and insight. Normal mood. Musculoskeletal:No digital cyanosis Skin:No Rash, warm and dry Wounds:N/A   Data Review:    CBC Recent Labs  Lab 05/24/2019 1330 06/03/19 0528 06/04/19 0325 06/05/19 0322  WBC 114.9* 122.4* 112.3* 81.3*  HGB 8.8* 8.7* 8.7* 9.0*  HCT 30.0* 29.6* 29.9* 29.9*  PLT 129* 134* 149* 127*  MCV 99.7 100.0 100.0 99.3  MCH 29.2 29.4 29.1 29.9  MCHC 29.3* 29.4* 29.1* 30.1  RDW 14.7 14.5 14.4 14.5  LYMPHSABS 91.4* 99.6* 90.5* 67.5*  MONOABS 16.7* 16.2* 14.6* 8.8*  EOSABS 0.0 0.0 0.0 0.0  BASOSABS 0.4* 0.1 0.2* 0.3*    Chemistries  Recent Labs  Lab 05/11/2019 1330 06/03/19 0528 06/04/19 0325 06/05/19 0322  NA 133* 137 139 138  K 4.4 5.0 5.4* 5.4*  CL 101 105 110 107  CO2 19* 21* 20* 21*  GLUCOSE 144* 185* 178* 153*  BUN 27* 32* 41* 51*  CREATININE 1.88* 1.69* 1.58* 1.42*  CALCIUM 9.2 8.9 9.1 9.2  MG  --  2.5* 2.5*  --   AST 16 17 18  14*  ALT 17 19 18 16   ALKPHOS 57 58 56 54  BILITOT 0.8 0.6 0.2* 0.4   ------------------------------------------------------------------------------------------------------------------ No results for input(s): CHOL, HDL, LDLCALC, TRIG, CHOLHDL, LDLDIRECT in the last 72 hours.  Lab Results  Component Value Date   HGBA1C 5.2 01/12/2019   ------------------------------------------------------------------------------------------------------------------ No results for input(s): TSH, T4TOTAL, T3FREE, THYROIDAB in the last 72 hours.  Invalid input(s): FREET3  ------------------------------------------------------------------------------------------------------------------ Recent Labs    06/04/19 0325 06/05/19 0322  FERRITIN 501* 490*    Coagulation profile No results for input(s): INR, PROTIME in the last 168 hours.  Recent Labs    06/04/19 0325 06/05/19 0322  DDIMER 0.39 0.31    Cardiac Enzymes No results for input(s): CKMB, TROPONINI, MYOGLOBIN in the last 168 hours.  Invalid input(s): CK ------------------------------------------------------------------------------------------------------------------ No results found for: BNP  Micro Results Recent Results (from the past 240 hour(s))  Novel Coronavirus, NAA ([facility])     Status: Abnormal   Collection Time: 05/28/19 10:36 AM  Result Value Ref Range Status   SARS-CoV-2, NAA Detected (A) Not Detected Final    Comment: Testing was performed using the cobas(R) SARS-CoV-2 test. This test was developed and its performance characteristics determined by [employer]. This test has not been FDA cleared or approved. This test has been authorized by FDA under an Emergency Use Authorization (EUA). This test is only authorized for the duration of time the declaration that circumstances exist justifying the authorization of the emergency use of in vitro diagnostic tests for detection of SARS-CoV-2 virus and/or diagnosis of COVID-19 infection under section 564(b)(1) of the Act, 21 U.S.C. 607PXT-0(G)(2), unless the authorization is terminated or revoked sooner. When diagnostic testing is negative,  the possibility of a false negative result should be considered in the context of a patient's recent exposures and the presence of clinical signs and symptoms consistent with COVID-19. An individual without symptoms of COVID-19 and who is not shedding SARS-CoV-2 virus would expect to have a negati ve (not detected) result in this assay.     Radiology Reports Dg Chest Port 1 View   Result Date: 06/03/2019 CLINICAL DATA:  Shortness of breath EXAM: PORTABLE CHEST 1 VIEW COMPARISON:  Chest x-rays dated 05/14/2019 and 02/26/2016. FINDINGS: Persistent opacity at the LEFT lung base. Additional opacity now noted within the periphery of the LEFT mid lung. Chronic opacities noted at the RIGHT lung base. No pneumothorax seen. Stable cardiomegaly. IMPRESSION: 1. Persistent opacity at the LEFT lung base. Additional opacity now noted within the periphery of the LEFT mid lung. Findings are suspicious for multifocal pneumonia. Consider chest CT for more definitive characterization. 2. Cardiomegaly. Electronically Signed   By: Franki Cabot M.D.   On: 06/03/2019 08:02   Dg Chest Port 1 View  Result Date: 05/24/2019 CLINICAL DATA:  Shortness of breath and cough EXAM: PORTABLE CHEST 1 VIEW COMPARISON:  June 16, 2016. FINDINGS: There is an area of ill-defined opacity adjacent to the left heart border which is suspicious for an area of pneumonia. Lungs elsewhere clear. Heart is upper normal in size with pulmonary vascularity normal. No adenopathy. No bone lesions. IMPRESSION: Ill-defined opacity adjacent to the left heart border suspicious for pneumonia. Lungs elsewhere clear. Heart upper normal in size. Followup PA and lateral chest radiographs recommended in 3-4 weeks following trial of antibiotic therapy to ensure resolution and exclude underlying malignancy. Electronically Signed   By: Lowella Grip III M.D.   On: 05/26/2019 14:05

## 2019-06-05 NOTE — Progress Notes (Signed)
Called patient wife Zigmund Daniel, answered questions and gave update; wife appreciative of care.  Continue to monitor.

## 2019-06-06 LAB — C-REACTIVE PROTEIN: CRP: 2 mg/dL — ABNORMAL HIGH (ref <1.0)

## 2019-06-06 LAB — CBC WITH DIFFERENTIAL/PLATELET
Abs Immature Granulocytes: 0.12 10*3/uL — ABNORMAL HIGH (ref 0.00–0.07)
Basophils Absolute: 0.2 10*3/uL — ABNORMAL HIGH (ref 0.0–0.1)
Basophils Relative: 0 %
Eosinophils Absolute: 0 10*3/uL (ref 0.0–0.5)
Eosinophils Relative: 0 %
HCT: 29.3 % — ABNORMAL LOW (ref 39.0–52.0)
Hemoglobin: 8.9 g/dL — ABNORMAL LOW (ref 13.0–17.0)
Immature Granulocytes: 0 %
Lymphocytes Relative: 82 %
Lymphs Abs: 52.8 10*3/uL — ABNORMAL HIGH (ref 0.7–4.0)
MCH: 30 pg (ref 26.0–34.0)
MCHC: 30.4 g/dL (ref 30.0–36.0)
MCV: 98.7 fL (ref 80.0–100.0)
Monocytes Absolute: 8 10*3/uL — ABNORMAL HIGH (ref 0.1–1.0)
Monocytes Relative: 12 %
Neutro Abs: 3.6 10*3/uL (ref 1.7–7.7)
Neutrophils Relative %: 6 %
Platelets: 130 10*3/uL — ABNORMAL LOW (ref 150–400)
RBC: 2.97 MIL/uL — ABNORMAL LOW (ref 4.22–5.81)
RDW: 14.4 % (ref 11.5–15.5)
WBC Morphology: ABNORMAL
WBC: 64.7 10*3/uL (ref 4.0–10.5)
nRBC: 0 % (ref 0.0–0.2)

## 2019-06-06 LAB — COMPREHENSIVE METABOLIC PANEL
ALT: 20 U/L (ref 0–44)
AST: 18 U/L (ref 15–41)
Albumin: 3.5 g/dL (ref 3.5–5.0)
Alkaline Phosphatase: 46 U/L (ref 38–126)
Anion gap: 7 (ref 5–15)
BUN: 52 mg/dL — ABNORMAL HIGH (ref 8–23)
CO2: 23 mmol/L (ref 22–32)
Calcium: 9 mg/dL (ref 8.9–10.3)
Chloride: 107 mmol/L (ref 98–111)
Creatinine, Ser: 1.4 mg/dL — ABNORMAL HIGH (ref 0.61–1.24)
GFR calc Af Amer: 59 mL/min — ABNORMAL LOW (ref >60)
GFR calc non Af Amer: 51 mL/min — ABNORMAL LOW (ref >60)
Glucose, Bld: 157 mg/dL — ABNORMAL HIGH (ref 70–99)
Potassium: 5.6 mmol/L — ABNORMAL HIGH (ref 3.5–5.1)
Sodium: 137 mmol/L (ref 135–145)
Total Bilirubin: 0.5 mg/dL (ref 0.3–1.2)
Total Protein: 5.9 g/dL — ABNORMAL LOW (ref 6.5–8.1)

## 2019-06-06 LAB — FERRITIN: Ferritin: 454 ng/mL — ABNORMAL HIGH (ref 24–336)

## 2019-06-06 LAB — D-DIMER, QUANTITATIVE: D-Dimer, Quant: <0.27 ug/mL-FEU (ref 0.00–0.50)

## 2019-06-06 MED ORDER — SODIUM ZIRCONIUM CYCLOSILICATE 10 G PO PACK
10.0000 g | PACK | Freq: Three times a day (TID) | ORAL | Status: AC
  Administered 2019-06-06 – 2019-06-07 (×3): 10 g via ORAL
  Filled 2019-06-06 (×3): qty 1

## 2019-06-06 MED ORDER — FUROSEMIDE 10 MG/ML IJ SOLN
40.0000 mg | Freq: Once | INTRAMUSCULAR | Status: AC
  Administered 2019-06-06: 40 mg via INTRAVENOUS
  Filled 2019-06-06: qty 4

## 2019-06-06 MED ORDER — POLYETHYLENE GLYCOL 3350 17 G PO PACK
17.0000 g | PACK | Freq: Two times a day (BID) | ORAL | Status: AC
  Administered 2019-06-06 – 2019-06-07 (×2): 17 g via ORAL
  Filled 2019-06-06 (×3): qty 1

## 2019-06-06 NOTE — Progress Notes (Signed)
Physical Therapy Treatment Patient Details Name: [name] Ball MRN: [id number] DOB: [birth year] Today's Date: 06/06/2019    History of Present Illness Patient is a 68 y.o. male with PMHx of CLL, HTN, Hypothyroidism, hx of CVA(left thalamic infarct), GERD, COPD/asthma, CKD stage 3 and severe hearing impared/deafness-presented to the hospital with SOB-found to have acute hypoxic resp failure secondary to COVID 19 PNA.     PT Comments    The patient ambulated x 40' x 2 with 2 assist for chair following. Gait is somewhat unsteady with cane. Will encourage pt to use RW next visit. Patient';s SaO2 dropped to 845 on 2 L, increased to 3 for second walk, SaO2 87%. Requires several minutes to recover,.  Continue PT. Patient is not near his baseline.  Follow Up Recommendations  Home health PT     Equipment Recommendations  Rolling walker with 5" wheels    Recommendations for Other Services       Precautions / Restrictions Precautions Precautions: Fall Precaution Comments: bad R knee, can buckle, watch sats    Mobility  Bed Mobility   Bed Mobility: Supine to Sit     Supine to sit: Supervision     General bed mobility comments: supervision for safety  Transfers   Equipment used: Straight cane Transfers: Sit to/from Stand Sit to Stand: Min guard            Ambulation/Gait Ambulation/Gait assistance: Min assist Gait Distance (Feet): 40 Feet(x 2) Assistive device: Straight cane Gait Pattern/deviations: Trunk flexed;Step-through pattern;Drifts right/left     General Gait Details: close min assist for stability. Patient on 2 L , drop to 84%, incr to 3 l  at 87%. Requires several minutes to recover,.   Stairs             Wheelchair Mobility    Modified Rankin (Stroke Patients Only)       Balance   Sitting-balance support: No upper extremity supported;Feet supported Sitting balance-Leahy Scale: Good     Standing balance support: During functional  activity;Single extremity supported Standing balance-Leahy Scale: Poor                              Cognition Arousal/Alertness: Awake/alert     Area of Impairment: Attention;Safety/judgement;Awareness                   Current Attention Level: Sustained           General Comments: Pt is a jokester, likes to laugh - very sweet - however he answered some questions incorrectly (i.e. do you live in a house or an apt? and he would answer" I have many stairs".) Unsure if related to hearing or cognition      Exercises      General Comments        Pertinent Vitals/Pain Pain Score: 4  Pain Location: R knee Pain Descriptors / Indicators: Constant;Discomfort Pain Intervention(s): Monitored during session;Premedicated before session    Home Living                      Prior Function            PT Goals (current goals can now be found in the care plan section) Progress towards PT goals: Progressing toward goals    Frequency    Min 3X/week      PT Plan Current plan remains appropriate    Co-evaluation PT/OT/SLP Co-Evaluation/Treatment: Yes Reason  for Co-Treatment: For patient/therapist safety PT goals addressed during session: Mobility/safety with mobility OT goals addressed during session: ADL's and self-care      AM-PAC PT "6 Clicks" Mobility   Outcome Measure  Help needed turning from your back to your side while in a flat bed without using bedrails?: None Help needed moving from lying on your back to sitting on the side of a flat bed without using bedrails?: A Little Help needed moving to and from a bed to a chair (including a wheelchair)?: A Lot Help needed standing up from a chair using your arms (e.g., wheelchair or bedside chair)?: A Lot Help needed to walk in hospital room?: A Lot Help needed climbing 3-5 steps with a railing? : A Lot 6 Click Score: 15    End of Session Equipment Utilized During Treatment: Gait  belt;Oxygen Activity Tolerance: No increased pain Patient left: in chair;with call bell/phone within reach;with chair alarm set Nurse Communication: Mobility status PT Visit Diagnosis: Unsteadiness on feet (R26.81)     Time: 1415-1450 PT Time Calculation (min) (ACUTE ONLY): 35 min  Charges:  $Gait Training: 8-22 mins                    Tresa Endo PT Acute Rehabilitation Services Pager [phone] Office [phone]  [name] 06/06/2019, 5:02 PM

## 2019-06-06 NOTE — Progress Notes (Signed)
Occupational Therapy Treatment Patient Details Name: [name] Ball MRN: [id number] DOB: 1951-01-22 Today's Date: 06/06/2019    History of present illness Patient is a 68 y.o. male with PMHx of CLL, HTN, Hypothyroidism, hx of CVA(left thalamic infarct), GERD, COPD/asthma, CKD stage 3 and severe hearing impared/deafness-presented to the hospital with SOB-found to have acute hypoxic resp failure secondary to COVID 19 PNA.    OT comments  Pt progressing towards OT goals this session, min guard for transfers from EOB for toilet transfer with [facility], agreeable to hallway ambulation with chair follow. Initially Pt on 2L O2, on RA Pt's O2 dropped to mid 80's. Pt's SaO2 dropped to 84% on 2 L during ambulation to bathroom and initial walk into hallway, increased to 3L for second walk, SaO2 87%. Requires several minutes to recover in seated position. Next session plan on HEP with theraband as well as Energy conservation education.    Follow Up Recommendations  Home health OT;Supervision/Assistance - 24 hour(initially)    Equipment Recommendations  3 in 1 bedside commode    Recommendations for Other Services      Precautions / Restrictions Precautions Precautions: Fall Precaution Comments: bad R knee, can buckle, watch sats Restrictions Weight Bearing Restrictions: No       Mobility Bed Mobility Overal bed mobility: Needs Assistance Bed Mobility: Supine to Sit     Supine to sit: Supervision     General bed mobility comments: supervision for safety  Transfers Overall transfer level: Needs assistance Equipment used: Straight cane Transfers: Sit to/from Stand Sit to Stand: Min guard              Balance Overall balance assessment: Needs assistance Sitting-balance support: No upper extremity supported;Feet supported Sitting balance-Leahy Scale: Good     Standing balance support: During functional activity;Single extremity supported Standing balance-Leahy Scale: Poor Standing  balance comment: dependent on SPC or external support                           ADL either performed or assessed with clinical judgement   ADL Overall ADL's : Needs assistance/impaired     Grooming: Set up;Sitting;Wash/dry face Grooming Details (indicate cue type and reason): in recliner post-ambualtion                 Toilet Transfer: Min guard;+2 for safety/equipment;Ambulation(SPC)   Toileting- Water quality scientist and Hygiene: Min guard       Functional mobility during ADLs: Min guard;+2 for safety/equipment(SPC)       Vision   Additional Comments: glasses in room, not needed this session   Perception     Praxis      Cognition Arousal/Alertness: Awake/alert Behavior During Therapy: WFL for tasks assessed/performed Overall Cognitive Status: Difficult to assess Area of Impairment: Attention;Safety/judgement;Awareness                   Current Attention Level: Sustained           General Comments: Pt is a jokester, likes to laugh - very sweet - however he answered some questions incorrectly (i.e. do you live in a house or an apt? and he would answer" I have many stairs".) Unsure if related to hearing or cognition        Exercises     Shoulder Instructions       General Comments At rest on 2 L O2 SpO2 was 93% with 88 HR; with mobility his Saturations dropped to 84% on 2L,  back to 89% when O2 increased to 3L.     Pertinent Vitals/ Pain       Pain Assessment: 0-10 Pain Score: 4  Pain Location: R knee Pain Descriptors / Indicators: Constant;Discomfort Pain Intervention(s): Monitored during session;Repositioned  Home Living                                          Prior Functioning/Environment              Frequency  Min 3X/week        Progress Toward Goals  OT Goals(current goals can now be found in the care plan section)  Progress towards OT goals: Progressing toward goals  Acute Rehab OT  Goals Patient Stated Goal: To get well and home to grandchildren OT Goal Formulation: With patient Time For Goal Achievement: 06/18/19 Potential to Achieve Goals: Good  Plan Discharge plan needs to be updated;Frequency needs to be updated    Co-evaluation    PT/OT/SLP Co-Evaluation/Treatment: Yes Reason for Co-Treatment: For patient/therapist safety PT goals addressed during session: Mobility/safety with mobility;Balance OT goals addressed during session: ADL's and self-care      AM-PAC OT "6 Clicks" Daily Activity     Outcome Measure   Help from another person eating meals?: None Help from another person taking care of personal grooming?: A Little Help from another person toileting, which includes using toliet, bedpan, or urinal?: A Little Help from another person bathing (including washing, rinsing, drying)?: A Little Help from another person to put on and taking off regular upper body clothing?: A Little Help from another person to put on and taking off regular lower body clothing?: A Lot 6 Click Score: 18    End of Session Equipment Utilized During Treatment: Gait belt;Oxygen(2-3L O2 via [place])  OT Visit Diagnosis: Unsteadiness on feet (R26.81);Muscle weakness (generalized) (M62.81);Pain Pain - Right/Left: Right Pain - part of body: Knee   Activity Tolerance Patient tolerated treatment well   Patient Left in chair;with call bell/phone within reach;with chair alarm set   Nurse Communication Mobility status        Time: 6962-9528 OT Time Calculation (min): 28 min  Charges: OT General Charges $OT Visit: 1 Visit OT Treatments $Therapeutic Activity: 8-22 mins  Hulda Humphrey OTR/L Acute Rehabilitation Services Pager: [phone] Office: [place] 06/06/2019, 5:43 PM

## 2019-06-06 NOTE — Care Management Important Message (Signed)
Important Message  Patient Details  Name: [name] Ball MRN: [id number] Date of Birth: 04/22/1951   Medicare Important Message Given:  Yes - Important Message mailed due to current National Emergency    Verbal consent obtained due to current National Emergency  Relationship to patient: Spouse/Significant Other Contact Name: Drema Dallas Call Date: 06/06/19  Time: 1636 Phone: [phone] Outcome: Spoke with contact Important Message mailed to: Emergency contact on file     Orbie Pyo 06/06/2019, 4:37 PM

## 2019-06-06 NOTE — Care Management Important Message (Signed)
Important Message  Patient Details  Name: [name] Ball MRN: [id number] Date of Birth: [birth year]   Medicare Important Message Given:  Yes - Important Message mailed due to current National Emergency  Verbal consent obtained due to current National Emergency  Relationship to patient: Spouse/Significant Other Contact Name: Issam Carlyon Call Date: 06/06/19  Time: 1544 Phone: [phone] Outcome: Spoke with contact Important Message mailed to: Emergency contact on file     [facility] 06/06/2019, 3:45 PM

## 2019-06-06 NOTE — Progress Notes (Signed)
PROGRESS NOTE                                                                                                                                                                                                             Patient Demographics:    [name] Ball, is a 68 y.o. male, DOB - 01/01/1951, QQP:[id number]  Outpatient Primary MD for the patient is [facility], [place] Nunnery, MD    LOS - 4  Chief Complaint  Patient presents with  . Shortness of Breath       Brief Narrative: Patient is a 68 y.o. male with PMHx of CLL, HTN, Hypothyroidism, hx of CVA, GERD, COPD/asthma, CKD stage 3 and severe hearing impared/deafness-presented to the hospital with SOB-found to have acute hypoxic resp failure secondary to COVID 19 PNA.   Subjective:    [name] Ball was lying comfortably in bed, he did drop to 87% st rest  when I stopped his oxygen.   Assessment  & Plan :   Acute Hypoxic Resp Failure due to Covid 19 Viral pneumonia:  - Stable, remains on 2 L nasal cannula this morning, I was unable to wean any further , hypoxic 87% on room air, back on 2 L nasal cannula encouraged use incentive spirometry . - Continue steroids and Remdesivir-attempt to titrate down oxygen.  Mobilize with PT-out of bed to chair.  Inflammatory markers are downtrending.Given clinical stability-and minimal oxygen requirement-do not think patient requires Actemra , no convulsant plasma given his Jehovah's Witness.  COVID-19 Labs:  Recent Labs    06/04/19 0325 06/05/19 0322 06/06/19 0205  DDIMER 0.39 0.31 <0.27  FERRITIN 501* 490* 454*  CRP 11.0* 4.5* 2.0*    Lab Results  Component Value Date   SARSCOV2NAA Detected (A) 05/28/2019     COVID-19 Medications: 6/27>> Solumedrol 6/27>>Remdesivir  Hyperkalemia: Significantly elevated at 5.6 today, will continue with Lokelma . Patient is on Bactrim (prophylaxis-as patient on chronic steroids for CLL).   Hypothyroidism: Continue Synthroid  CKD (chronic kidney disease), stage III ([place]): Remains at baseline-follow  COPD/Asthma: no exacerbation-continue bronchodilators.  GERD (gastroesophageal reflux disease):continue PPI  Essential hypertension:controlled-continue metoprolol-resume Norvasc over the next few days,  Depression:continue celexa,no SI or hallucinations   CLL (chronic lymphocytic leukemia): stable-has significant leukocytosis at baseline. Follow with Oncology on discharge.  Is maintained on 5 mg of prednisolone daily-and on  prophylactic Bactrim, prednisone is on hold given he is on IV Solu-Medrol.  Autoimmune hemolytic anemia related to CLL: Hemoglobin stable-on steroids.  History of CVA (cerebrovascular accident):no new focal deficits-continue Plavix  OSA: on CPAP at home-unable to resume during this hospitalization due to hospital protocol. On O2.  Morbid obesity  Severe hearing loss/Deafness  Jehovah's Witness: Refuses all blood product    ABG:    Component Value Date/Time   TCO2 25 03/08/2017 2353    Condition -Stable  Family Communication  : Unable to reach patient's spouse-updated daughter (daughter answered spouse's phone)  Code Status :  Full Code  Diet :  Diet Order            Diet Heart Room service appropriate? Yes; Fluid consistency: Thin  Diet effective now               Disposition Plan  :  Remain inpatient   DVT Prophylaxis  :   Heparin  Lab Results  Component Value Date   PLT 130 (L) 06/06/2019    Inpatient Medications  Scheduled Meds: . citalopram  10 mg Oral Daily  . clopidogrel  75 mg Oral Daily  . fluticasone  2 puff Inhalation BID  . folic acid  1 mg Oral Daily  . furosemide  40 mg Intravenous Once  . heparin  5,000 Units Subcutaneous Q8H  . Ipratropium-Albuterol  1 puff Inhalation Q6H  . levothyroxine  50 mcg Oral Q0600  . methylPREDNISolone (SOLU-MEDROL) injection  40 mg Intravenous Q12H  . metoprolol  tartrate  12.5 mg Oral BID  . pantoprazole  40 mg Oral Daily  . polyethylene glycol  17 g Oral BID  . sodium zirconium cyclosilicate  10 g Oral TID  . sulfamethoxazole-trimethoprim  1 tablet Oral Q M,W,F   Continuous Infusions: . remdesivir 100 mg in NS 250 mL Stopped (06/05/19 2254)   PRN Meds:.acetaminophen, guaiFENesin-dextromethorphan, HYDROcodone-acetaminophen, ondansetron **OR** ondansetron (ZOFRAN) IV  Antibiotics  :    Anti-infectives (From admission, onward)   Start     Dose/Rate Route Frequency Ordered Stop   06/04/19 1000  sulfamethoxazole-trimethoprim (BACTRIM DS) 800-160 MG per tablet 1 tablet     1 tablet Oral Every M-W-F 05/31/2019 1551     06/03/19 2100  remdesivir 100 mg in sodium chloride 0.9 % 250 mL IVPB     100 mg 500 mL/hr over 30 Minutes Intravenous Every 24 hours 05/27/2019 1956 06/07/19 2059   05/24/2019 2100  remdesivir 200 mg in sodium chloride 0.9 % 250 mL IVPB     200 mg 500 mL/hr over 30 Minutes Intravenous Once 05/27/2019 1956 05/21/2019 2315   05/09/2019 1430  vancomycin (VANCOCIN) IVPB 1000 mg/200 mL premix     1,000 mg 200 mL/hr over 60 Minutes Intravenous  Once 05/26/2019 1422 05/12/2019 1547   05/30/2019 1430  piperacillin-tazobactam (ZOSYN) IVPB 3.375 g     3.375 g 100 mL/hr over 30 Minutes Intravenous  Once 05/28/2019 1422 05/31/2019 1514         Objective:   Vitals:   06/05/19 1520 06/05/19 2015 06/06/19 0530 06/06/19 0900  BP: (!) 154/71   (!) 143/64  Pulse: 90     Resp: (!) 30     Temp: 97.6 F (36.4 C) 98.4 F (36.9 C) 97.9 F (36.6 C) 98 F (36.7 C)  TempSrc: Oral Oral Oral Oral  SpO2: 95%     Weight:      Height:        Wt Readings from  Last 3 Encounters:  05/25/2019 114.4 kg  05/16/19 120.4 kg  04/12/19 120.7 kg     Intake/Output Summary (Last 24 hours) at 06/06/2019 1335 Last data filed at 06/06/2019 1000 Gross per 24 hour  Intake -  Output 1550 ml  Net -1550 ml     Physical Exam  Awake Alert, Oriented X 3, extremly hard of  hearing, no new F.N deficits, Normal affect Symmetrical Chest wall movement, Good air movement bilaterally, CTAB RRR,No Gallops,Rubs or new Murmurs, No Parasternal Heave +ve B.Sounds, Abd Soft, No tenderness, No rebound - guarding or rigidity. No Cyanosis, Clubbing or edema, No new Rash or bruise      Data Review:    CBC Recent Labs  Lab 05/09/2019 1330 06/03/19 0528 06/04/19 0325 06/05/19 0322 06/06/19 0205  WBC 114.9* 122.4* 112.3* 81.3* 64.7*  HGB 8.8* 8.7* 8.7* 9.0* 8.9*  HCT 30.0* 29.6* 29.9* 29.9* 29.3*  PLT 129* 134* 149* 127* 130*  MCV 99.7 100.0 100.0 99.3 98.7  MCH 29.2 29.4 29.1 29.9 30.0  MCHC 29.3* 29.4* 29.1* 30.1 30.4  RDW 14.7 14.5 14.4 14.5 14.4  LYMPHSABS 91.4* 99.6* 90.5* 67.5* 52.8*  MONOABS 16.7* 16.2* 14.6* 8.8* 8.0*  EOSABS 0.0 0.0 0.0 0.0 0.0  BASOSABS 0.4* 0.1 0.2* 0.3* 0.2*    Chemistries  Recent Labs  Lab 05/26/2019 1330 06/03/19 0528 06/04/19 0325 06/05/19 0322 06/06/19 0205  NA 133* 137 139 138 137  K 4.4 5.0 5.4* 5.4* 5.6*  CL 101 105 110 107 107  CO2 19* 21* 20* 21* 23  GLUCOSE 144* 185* 178* 153* 157*  BUN 27* 32* 41* 51* 52*  CREATININE 1.88* 1.69* 1.58* 1.42* 1.40*  CALCIUM 9.2 8.9 9.1 9.2 9.0  MG  --  2.5* 2.5*  --   --   AST 16 17 18  14* 18  ALT 17 19 18 16 20   ALKPHOS 57 58 56 54 46  BILITOT 0.8 0.6 0.2* 0.4 0.5   ------------------------------------------------------------------------------------------------------------------ No results for input(s): CHOL, HDL, LDLCALC, TRIG, CHOLHDL, LDLDIRECT in the last 72 hours.  Lab Results  Component Value Date   HGBA1C 5.2 01/12/2019   ------------------------------------------------------------------------------------------------------------------ No results for input(s): TSH, T4TOTAL, T3FREE, THYROIDAB in the last 72 hours.  Invalid input(s): FREET3 ------------------------------------------------------------------------------------------------------------------ Recent Labs     06/05/19 0322 06/06/19 0205  FERRITIN 490* 454*    Coagulation profile No results for input(s): INR, PROTIME in the last 168 hours.  Recent Labs    06/05/19 0322 06/06/19 0205  DDIMER 0.31 <0.27    Cardiac Enzymes No results for input(s): CKMB, TROPONINI, MYOGLOBIN in the last 168 hours.  Invalid input(s): CK ------------------------------------------------------------------------------------------------------------------ No results found for: BNP  Micro Results Recent Results (from the past 240 hour(s))  Novel Coronavirus, NAA ([facility])     Status: Abnormal   Collection Time: 05/28/19 10:36 AM  Result Value Ref Range Status   SARS-CoV-2, NAA Detected (A) Not Detected Final    Comment: Testing was performed using the cobas(R) SARS-CoV-2 test. This test was developed and its performance characteristics determined by [employer]. This test has not been FDA cleared or approved. This test has been authorized by FDA under an Emergency Use Authorization (EUA). This test is only authorized for the duration of time the declaration that circumstances exist justifying the authorization of the emergency use of in vitro diagnostic tests for detection of SARS-CoV-2 virus and/or diagnosis of COVID-19 infection under section 564(b)(1) of the Act, 21 U.S.C. 700FVC-9(S)(4), unless the authorization is terminated or revoked sooner.  When diagnostic testing is negative, the possibility of a false negative result should be considered in the context of a patient's recent exposures and the presence of clinical signs and symptoms consistent with COVID-19. An individual without symptoms of COVID-19 and who is not shedding SARS-CoV-2 virus would expect to have a negati ve (not detected) result in this assay.     Radiology Reports Dg Chest Port 1 View  Result Date: 06/03/2019 CLINICAL DATA:  Shortness of breath EXAM: PORTABLE CHEST 1 VIEW COMPARISON:  Chest x-rays dated  06/01/2019 and 10/26/202017. FINDINGS: Persistent opacity at the LEFT lung base. Additional opacity now noted within the periphery of the LEFT mid lung. Chronic opacities noted at the RIGHT lung base. No pneumothorax seen. Stable cardiomegaly. IMPRESSION: 1. Persistent opacity at the LEFT lung base. Additional opacity now noted within the periphery of the LEFT mid lung. Findings are suspicious for multifocal pneumonia. Consider chest CT for more definitive characterization. 2. Cardiomegaly. Electronically Signed   By: Franki Cabot M.D.   On: 06/03/2019 08:02   Dg Chest Port 1 View  Result Date: 05/26/2019 CLINICAL DATA:  Shortness of breath and cough EXAM: PORTABLE CHEST 1 VIEW COMPARISON:  June 16, 2016. FINDINGS: There is an area of ill-defined opacity adjacent to the left heart border which is suspicious for an area of pneumonia. Lungs elsewhere clear. Heart is upper normal in size with pulmonary vascularity normal. No adenopathy. No bone lesions. IMPRESSION: Ill-defined opacity adjacent to the left heart border suspicious for pneumonia. Lungs elsewhere clear. Heart upper normal in size. Followup PA and lateral chest radiographs recommended in 3-4 weeks following trial of antibiotic therapy to ensure resolution and exclude underlying malignancy. Electronically Signed   By: Lowella Grip III M.D.   On: 05/28/2019 14:05    Time Spent in minutes  25   Phillips Climes M.D on 06/06/2019 at 1:35 PM  To page go to [email or web address] - use universal password  [facility] -  Office  [phone]   See all Orders from today for further details   Admit date - 05/31/2019    4

## 2019-06-06 DEATH — deceased

## 2019-06-07 LAB — COMPREHENSIVE METABOLIC PANEL
ALT: 28 U/L (ref 0–44)
AST: 19 U/L (ref 15–41)
Albumin: 3.6 g/dL (ref 3.5–5.0)
Alkaline Phosphatase: 47 U/L (ref 38–126)
Anion gap: 9 (ref 5–15)
BUN: 55 mg/dL — ABNORMAL HIGH (ref 8–23)
CO2: 24 mmol/L (ref 22–32)
Calcium: 8.9 mg/dL (ref 8.9–10.3)
Chloride: 105 mmol/L (ref 98–111)
Creatinine, Ser: 1.5 mg/dL — ABNORMAL HIGH (ref 0.61–1.24)
GFR calc Af Amer: 54 mL/min — ABNORMAL LOW (ref >60)
GFR calc non Af Amer: 47 mL/min — ABNORMAL LOW (ref >60)
Glucose, Bld: 169 mg/dL — ABNORMAL HIGH (ref 70–99)
Potassium: 5.2 mmol/L — ABNORMAL HIGH (ref 3.5–5.1)
Sodium: 138 mmol/L (ref 135–145)
Total Bilirubin: 0.5 mg/dL (ref 0.3–1.2)
Total Protein: 5.8 g/dL — ABNORMAL LOW (ref 6.5–8.1)

## 2019-06-07 LAB — CBC WITH DIFFERENTIAL/PLATELET
Abs Immature Granulocytes: 0.12 10*3/uL — ABNORMAL HIGH (ref 0.00–0.07)
Basophils Absolute: 0.1 10*3/uL (ref 0.0–0.1)
Basophils Relative: 0 %
Eosinophils Absolute: 0 10*3/uL (ref 0.0–0.5)
Eosinophils Relative: 0 %
HCT: 29.6 % — ABNORMAL LOW (ref 39.0–52.0)
Hemoglobin: 8.8 g/dL — ABNORMAL LOW (ref 13.0–17.0)
Immature Granulocytes: 0 %
Lymphocytes Relative: 77 %
Lymphs Abs: 46 10*3/uL — ABNORMAL HIGH (ref 0.7–4.0)
MCH: 29.1 pg (ref 26.0–34.0)
MCHC: 29.7 g/dL — ABNORMAL LOW (ref 30.0–36.0)
MCV: 98 fL (ref 80.0–100.0)
Monocytes Absolute: 10.4 10*3/uL — ABNORMAL HIGH (ref 0.1–1.0)
Monocytes Relative: 17 %
Neutro Abs: 3.6 10*3/uL (ref 1.7–7.7)
Neutrophils Relative %: 6 %
Platelets: 143 10*3/uL — ABNORMAL LOW (ref 150–400)
RBC: 3.02 MIL/uL — ABNORMAL LOW (ref 4.22–5.81)
RDW: 14.6 % (ref 11.5–15.5)
WBC Morphology: ABNORMAL
WBC: 60.1 10*3/uL (ref 4.0–10.5)
nRBC: 0 % (ref 0.0–0.2)

## 2019-06-07 LAB — C-REACTIVE PROTEIN: CRP: 2.8 mg/dL — ABNORMAL HIGH (ref <1.0)

## 2019-06-07 LAB — FERRITIN: Ferritin: 420 ng/mL — ABNORMAL HIGH (ref 24–336)

## 2019-06-07 LAB — D-DIMER, QUANTITATIVE: D-Dimer, Quant: 0.29 ug/mL-FEU (ref 0.00–0.50)

## 2019-06-07 MED ORDER — SODIUM ZIRCONIUM CYCLOSILICATE 10 G PO PACK
10.0000 g | PACK | Freq: Three times a day (TID) | ORAL | Status: AC
  Administered 2019-06-07 – 2019-06-08 (×3): 10 g via ORAL
  Filled 2019-06-07 (×3): qty 1

## 2019-06-07 MED ORDER — POLYETHYLENE GLYCOL 3350 17 G PO PACK
17.0000 g | PACK | Freq: Two times a day (BID) | ORAL | Status: AC
  Filled 2019-06-07: qty 1

## 2019-06-07 MED ORDER — FUROSEMIDE 10 MG/ML IJ SOLN
40.0000 mg | Freq: Once | INTRAMUSCULAR | Status: AC
  Administered 2019-06-07: 40 mg via INTRAVENOUS
  Filled 2019-06-07: qty 4

## 2019-06-07 NOTE — Progress Notes (Signed)
Called by Tele monitoring d/t pt cardiac leads being off.  Upon entering room pt was in the bathroom.  Pt wearing nasal canula but was experiencing shortness of breath, increased o2 to 6l n/c while pt was in the bathroom.  Assisted pt back to bed, Sats 83% on 6 l n/c.  Instructed pt to take deep breaths through his nose.

## 2019-06-07 NOTE — Progress Notes (Signed)
PROGRESS NOTE                                                                                                                                                                                                             Patient Demographics:    [name] Ball, is a 68 y.o. male, DOB - 1951-08-26, ZOX:[id number]  Outpatient Primary MD for the patient is [facility], [place] Nunnery, MD    LOS - 5  Chief Complaint  Patient presents with  . Shortness of Breath       Brief Narrative: Patient is a 68 y.o. male with PMHx of CLL, HTN, Hypothyroidism, hx of CVA, GERD, COPD/asthma, CKD stage 3 and severe hearing impared/deafness-presented to the hospital with SOB-found to have acute hypoxic resp failure secondary to COVID 19 PNA.   Subjective:    [name] Ball denies any complaints today, he had bowel movement earlier today, he denies chest pain, still reports some dyspnea .   Assessment  & Plan :   Acute Hypoxic Resp Failure due to Covid 19 Viral pneumonia:  - Stable, remains on 2 L nasal cannula this morning, unable to wean any further, he was encouraged again to use incentive spirometry, and out of bed to chair . - Continue steroids and Remdesivir-attempt to titrate down oxygen.  Mobilize with PT-out of bed to chair.  Inflammatory markers are downtrending.Given clinical stability-and minimal oxygen requirement-do not think patient requires Actemra , no convulsant plasma given his Jehovah's Witness. -Has been receiving Lasix as needed, will give another dose of Lasix today to see if it helps with his oxygen requirement, I will check proBNP in a.m. as well.   COVID-19 Labs:  Recent Labs    06/05/19 0322 06/06/19 0205 06/07/19 0300 06/07/19 0415  DDIMER 0.31 <0.27 0.29  --   FERRITIN 490* 454*  --  420*  CRP 4.5* 2.0*  --  2.8*    Lab Results  Component Value Date   SARSCOV2NAA Detected (A) 05/28/2019     COVID-19  Medications: 6/27>> Solumedrol 6/27>>Remdesivir  Hyperkalemia: Improving , it is 5.2 today, this is most likely related to Lasix and Lokelma he is receiving, will give Lokelma and Lasix again today . - Patient is on Bactrim (prophylaxis-as patient on chronic steroids for CLL).  Hypothyroidism: Continue Synthroid  CKD (chronic kidney disease), stage III ([place]):  Remains at baseline-follow  COPD/Asthma: no exacerbation-continue bronchodilators.  GERD (gastroesophageal reflux disease):continue PPI  Essential hypertension:controlled-continue metoprolol-resume Norvasc over the next few days,  Depression:continue celexa,no SI or hallucinations   CLL (chronic lymphocytic leukemia): stable-has significant leukocytosis at baseline. Follow with Oncology on discharge.  Is maintained on 5 mg of prednisolone daily-and on prophylactic Bactrim, prednisone is on hold given he is on IV Solu-Medrol.  Autoimmune hemolytic anemia related to CLL: Hemoglobin stable-on steroids.  History of CVA (cerebrovascular accident):no new focal deficits-continue Plavix  OSA: on CPAP at home-unable to resume during this hospitalization due to hospital protocol. On O2.  Morbid obesity  Severe hearing loss/Deafness  Jehovah's Witness: Refuses all blood product    ABG:    Component Value Date/Time   TCO2 25 03/08/2017 2353    Condition -Stable  Family Communication  : D/W wifr via phone 7/2  Code Status :  Full Code  Diet :  Diet Order            Diet Heart Room service appropriate? Yes; Fluid consistency: Thin  Diet effective now               Disposition Plan  :  Remain inpatient   DVT Prophylaxis  :   Heparin  Lab Results  Component Value Date   PLT 143 (L) 06/07/2019    Inpatient Medications  Scheduled Meds: . citalopram  10 mg Oral Daily  . clopidogrel  75 mg Oral Daily  . fluticasone  2 puff Inhalation BID  . folic acid  1 mg Oral Daily  . heparin  5,000 Units  Subcutaneous Q8H  . Ipratropium-Albuterol  1 puff Inhalation Q6H  . levothyroxine  50 mcg Oral Q0600  . methylPREDNISolone (SOLU-MEDROL) injection  40 mg Intravenous Q12H  . metoprolol tartrate  12.5 mg Oral BID  . pantoprazole  40 mg Oral Daily  . polyethylene glycol  17 g Oral BID  . sulfamethoxazole-trimethoprim  1 tablet Oral Q M,W,F   Continuous Infusions:  PRN Meds:.acetaminophen, guaiFENesin-dextromethorphan, HYDROcodone-acetaminophen, ondansetron **OR** ondansetron (ZOFRAN) IV  Antibiotics  :    Anti-infectives (From admission, onward)   Start     Dose/Rate Route Frequency Ordered Stop   06/04/19 1000  sulfamethoxazole-trimethoprim (BACTRIM DS) 800-160 MG per tablet 1 tablet     1 tablet Oral Every M-W-F 05/19/2019 1551     06/03/19 2100  remdesivir 100 mg in sodium chloride 0.9 % 250 mL IVPB     100 mg 500 mL/hr over 30 Minutes Intravenous Every 24 hours 05/16/2019 1956 06/06/19 2143   05/10/2019 2100  remdesivir 200 mg in sodium chloride 0.9 % 250 mL IVPB     200 mg 500 mL/hr over 30 Minutes Intravenous Once 05/19/2019 1956 06/05/2019 2315   05/12/2019 1430  vancomycin (VANCOCIN) IVPB 1000 mg/200 mL premix     1,000 mg 200 mL/hr over 60 Minutes Intravenous  Once 05/13/2019 1422 05/21/2019 1547   05/25/2019 1430  piperacillin-tazobactam (ZOSYN) IVPB 3.375 g     3.375 g 100 mL/hr over 30 Minutes Intravenous  Once 05/10/2019 1422 05/19/2019 1514         Objective:   Vitals:   06/06/19 2000 06/07/19 0400 06/07/19 0753 06/07/19 0906  BP: (!) 158/66 136/67 137/74   Pulse: 94 74 86   Resp: (!) 26 (!) 22 (!) 23   Temp: 98.6 F (37 C) 98.4 F (36.9 C)  98.2 F (36.8 C)  TempSrc: Oral Oral  Oral  SpO2: 94% 91%  Weight:      Height:        Wt Readings from Last 3 Encounters:  05/31/2019 114.4 kg  05/16/19 120.4 kg  04/12/19 120.7 kg     Intake/Output Summary (Last 24 hours) at 06/07/2019 1435 Last data filed at 06/07/2019 0800 Gross per 24 hour  Intake 900 ml  Output 2250 ml   Net -1350 ml     Physical Exam  Awake Alert, Oriented X 3, extremely hard of hearing,No new F.N deficits, Normal affect Symmetrical Chest wall movement, Good air movement bilaterally, CTAB RRR,No Gallops,Rubs or new Murmurs, No Parasternal Heave +ve B.Sounds, Abd Soft, No tenderness, No rebound - guarding or rigidity. No Cyanosis, Clubbing or edema, No new Rash or bruise       Data Review:    CBC Recent Labs  Lab 06/03/19 0528 06/04/19 0325 06/05/19 0322 06/06/19 0205 06/07/19 0300  WBC 122.4* 112.3* 81.3* 64.7* 60.1*  HGB 8.7* 8.7* 9.0* 8.9* 8.8*  HCT 29.6* 29.9* 29.9* 29.3* 29.6*  PLT 134* 149* 127* 130* 143*  MCV 100.0 100.0 99.3 98.7 98.0  MCH 29.4 29.1 29.9 30.0 29.1  MCHC 29.4* 29.1* 30.1 30.4 29.7*  RDW 14.5 14.4 14.5 14.4 14.6  LYMPHSABS 99.6* 90.5* 67.5* 52.8* 46.0*  MONOABS 16.2* 14.6* 8.8* 8.0* 10.4*  EOSABS 0.0 0.0 0.0 0.0 0.0  BASOSABS 0.1 0.2* 0.3* 0.2* 0.1    Chemistries  Recent Labs  Lab 06/03/19 0528 06/04/19 0325 06/05/19 0322 06/06/19 0205 06/07/19 0300  NA 137 139 138 137 138  K 5.0 5.4* 5.4* 5.6* 5.2*  CL 105 110 107 107 105  CO2 21* 20* 21* 23 24  GLUCOSE 185* 178* 153* 157* 169*  BUN 32* 41* 51* 52* 55*  CREATININE 1.69* 1.58* 1.42* 1.40* 1.50*  CALCIUM 8.9 9.1 9.2 9.0 8.9  MG 2.5* 2.5*  --   --   --   AST 17 18 14* 18 19  ALT 19 18 16 20 28   ALKPHOS 58 56 54 46 47  BILITOT 0.6 0.2* 0.4 0.5 0.5   ------------------------------------------------------------------------------------------------------------------ No results for input(s): CHOL, HDL, LDLCALC, TRIG, CHOLHDL, LDLDIRECT in the last 72 hours.  Lab Results  Component Value Date   HGBA1C 5.2 01/12/2019   ------------------------------------------------------------------------------------------------------------------ No results for input(s): TSH, T4TOTAL, T3FREE, THYROIDAB in the last 72 hours.  Invalid input(s): FREET3  ------------------------------------------------------------------------------------------------------------------ Recent Labs    06/06/19 0205 06/07/19 0415  FERRITIN 454* 420*    Coagulation profile No results for input(s): INR, PROTIME in the last 168 hours.  Recent Labs    06/06/19 0205 06/07/19 0300  DDIMER <0.27 0.29    Cardiac Enzymes No results for input(s): CKMB, TROPONINI, MYOGLOBIN in the last 168 hours.  Invalid input(s): CK ------------------------------------------------------------------------------------------------------------------ No results found for: BNP  Micro Results No results found for this or any previous visit (from the past 240 hour(s)).  Radiology Reports Dg Chest Port 1 View  Result Date: 06/03/2019 CLINICAL DATA:  Shortness of breath EXAM: PORTABLE CHEST 1 VIEW COMPARISON:  Chest x-rays dated 06/04/2019 and 08/05/2016. FINDINGS: Persistent opacity at the LEFT lung base. Additional opacity now noted within the periphery of the LEFT mid lung. Chronic opacities noted at the RIGHT lung base. No pneumothorax seen. Stable cardiomegaly. IMPRESSION: 1. Persistent opacity at the LEFT lung base. Additional opacity now noted within the periphery of the LEFT mid lung. Findings are suspicious for multifocal pneumonia. Consider chest CT for more definitive characterization. 2. Cardiomegaly. Electronically Signed   By: Franki Cabot  M.D.   On: 06/03/2019 08:02   Dg Chest Port 1 View  Result Date: 05/25/2019 CLINICAL DATA:  Shortness of breath and cough EXAM: PORTABLE CHEST 1 VIEW COMPARISON:  June 16, 2016. FINDINGS: There is an area of ill-defined opacity adjacent to the left heart border which is suspicious for an area of pneumonia. Lungs elsewhere clear. Heart is upper normal in size with pulmonary vascularity normal. No adenopathy. No bone lesions. IMPRESSION: Ill-defined opacity adjacent to the left heart border suspicious for pneumonia. Lungs elsewhere clear.  Heart upper normal in size. Followup PA and lateral chest radiographs recommended in 3-4 weeks following trial of antibiotic therapy to ensure resolution and exclude underlying malignancy. Electronically Signed   By: Lowella Grip III M.D.   On: 06/01/2019 14:05    Time Spent in minutes  25   Phillips Climes M.D on 06/06/2019 at 1:35 PM  To page go to [email or web address] - use universal password  [facility] -  Office  [phone]   See all Orders from today for further details   Admit date - 05/25/2019    4

## 2019-06-07 NOTE — Progress Notes (Signed)
Pt sats back up to 92% on 6l n/c, turned back down to 2.  Instructed pt use call light for assistance. Placed bedside toilet in pt's room.

## 2019-06-08 LAB — COMPREHENSIVE METABOLIC PANEL
ALT: 26 U/L (ref 0–44)
AST: 15 U/L (ref 15–41)
Albumin: 4 g/dL (ref 3.5–5.0)
Alkaline Phosphatase: 50 U/L (ref 38–126)
Anion gap: 11 (ref 5–15)
BUN: 58 mg/dL — ABNORMAL HIGH (ref 8–23)
CO2: 25 mmol/L (ref 22–32)
Calcium: 9.1 mg/dL (ref 8.9–10.3)
Chloride: 101 mmol/L (ref 98–111)
Creatinine, Ser: 1.78 mg/dL — ABNORMAL HIGH (ref 0.61–1.24)
GFR calc Af Amer: 44 mL/min — ABNORMAL LOW (ref >60)
GFR calc non Af Amer: 38 mL/min — ABNORMAL LOW (ref >60)
Glucose, Bld: 134 mg/dL — ABNORMAL HIGH (ref 70–99)
Potassium: 4.1 mmol/L (ref 3.5–5.1)
Sodium: 137 mmol/L (ref 135–145)
Total Bilirubin: 0.9 mg/dL (ref 0.3–1.2)
Total Protein: 6.7 g/dL (ref 6.5–8.1)

## 2019-06-08 LAB — CBC
HCT: 32.2 % — ABNORMAL LOW (ref 39.0–52.0)
Hemoglobin: 9.6 g/dL — ABNORMAL LOW (ref 13.0–17.0)
MCH: 29 pg (ref 26.0–34.0)
MCHC: 29.8 g/dL — ABNORMAL LOW (ref 30.0–36.0)
MCV: 97.3 fL (ref 80.0–100.0)
Platelets: 184 10*3/uL (ref 150–400)
RBC: 3.31 MIL/uL — ABNORMAL LOW (ref 4.22–5.81)
RDW: 14.5 % (ref 11.5–15.5)
WBC: 84.7 10*3/uL (ref 4.0–10.5)
nRBC: 0 % (ref 0.0–0.2)

## 2019-06-08 LAB — C-REACTIVE PROTEIN: CRP: 1.8 mg/dL — ABNORMAL HIGH (ref <1.0)

## 2019-06-08 LAB — BRAIN NATRIURETIC PEPTIDE: B Natriuretic Peptide: 24.4 pg/mL (ref 0.0–100.0)

## 2019-06-08 NOTE — Progress Notes (Signed)
Patient's wife Zigmund Daniel was contacted and updated on patient status. Wife states that if patient has to come home on some oxygen that would be fine with her. Emotional support was given to the patient's wife.

## 2019-06-08 NOTE — Plan of Care (Signed)
Pt careplan discussed.  Pt improving with hopes to go home today.

## 2019-06-08 NOTE — Progress Notes (Addendum)
Pt sleeping.  No s/sx of distress noted.  CLWR.  0430  Pt sleeping.  No s/sx of distress noted.  CLWR.

## 2019-06-08 NOTE — Progress Notes (Signed)
Occupational Therapy Treatment Patient Details Name: [name] Ball MRN: [id number] DOB: [birth year] Today's Date: 06/08/2019    History of present illness Patient is a 68 y.o. male with PMHx of CLL, HTN, Hypothyroidism, hx of CVA(left thalamic infarct), GERD, COPD/asthma, CKD stage 3 and severe hearing impared/deafness-presented to the hospital with SOB-found to have acute hypoxic resp failure secondary to COVID 19 PNA.    OT comments  Pt progressing towards OT goals this session, able to demonstrate LB dressing EOB at supervision level, transfers at min guard with rollator, toilet transfer in bathroom at min guard (+2 helpful for lines/equipment management), sink level grooming at min guard, and hallway ambulation. Max HR 126, RR 45 SpO2  86% on 3 L [place] with probe on forehead. Pt also provided with energy conservation education handout and HEP (will need theraband - we ran on on the floor). OT will continue to follow acutely.   Follow Up Recommendations  Home health OT;Supervision/Assistance - 24 hour(initially)    Equipment Recommendations  3 in 1 bedside commode(WIDE)    Recommendations for Other Services      Precautions / Restrictions Precautions Precautions: Fall Precaution Comments: bad R knee, can buckle, watch sats Restrictions Weight Bearing Restrictions: No       Mobility Bed Mobility Overal bed mobility: Modified Independent       Supine to sit: Supervision     General bed mobility comments: supervision for safety  Transfers Overall transfer level: Needs assistance Equipment used: 4-wheeled walker Transfers: Sit to/from Stand Sit to Stand: Min guard         General transfer comment: easily stood from bed and rollator    Balance Overall balance assessment: Needs assistance Sitting-balance support: No upper extremity supported;Feet supported Sitting balance-Leahy Scale: Good     Standing balance support: Bilateral upper extremity supported;During  functional activity Standing balance-Leahy Scale: Fair                             ADL either performed or assessed with clinical judgement   ADL Overall ADL's : Needs assistance/impaired     Grooming: Wash/dry hands;Wash/dry face;Set up;Sitting Grooming Details (indicate cue type and reason): in recliner post-ambualtion             Lower Body Dressing: Supervision/safety;Sitting/lateral leans Lower Body Dressing Details (indicate cue type and reason): able to don socks EOB Toilet Transfer: Min guard;+2 for safety/equipment(Rollator)   Toileting- Water quality scientist and Hygiene: Min guard       Functional mobility during ADLs: Min guard;+2 for safety/equipment(Rollator)       Vision       Perception     Praxis      Cognition Arousal/Alertness: Awake/alert Behavior During Therapy: WFL for tasks assessed/performed Overall Cognitive Status: Within Functional Limits for tasks assessed                                 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, likes to laugh        Exercises     Shoulder Instructions       General Comments      Pertinent Vitals/ Pain       Pain Assessment: Faces Faces Pain Scale: Hurts a little bit Pain Location: R knee Pain Descriptors / Indicators: Constant;Discomfort Pain Intervention(s): Monitored during session;Repositioned  Home Living  Prior Functioning/Environment              Frequency  Min 3X/week        Progress Toward Goals  OT Goals(current goals can now be found in the care plan section)  Progress towards OT goals: Progressing toward goals  Acute Rehab OT Goals Patient Stated Goal: To get well and home to grandchildren OT Goal Formulation: With patient Time For Goal Achievement: 06/18/19 Potential to Achieve Goals: Good  Plan Discharge plan needs to be updated;Frequency needs to be updated     Co-evaluation    PT/OT/SLP Co-Evaluation/Treatment: Yes Reason for Co-Treatment: For patient/therapist safety PT goals addressed during session: Mobility/safety with mobility;Balance;Proper use of DME OT goals addressed during session: ADL's and self-care;Strengthening/ROM;Proper use of Adaptive equipment and DME      AM-PAC OT "6 Clicks" Daily Activity     Outcome Measure   Help from another person eating meals?: None Help from another person taking care of personal grooming?: A Little Help from another person toileting, which includes using toliet, bedpan, or urinal?: A Little Help from another person bathing (including washing, rinsing, drying)?: A Little Help from another person to put on and taking off regular upper body clothing?: A Little Help from another person to put on and taking off regular lower body clothing?: A Lot 6 Click Score: 18    End of Session Equipment Utilized During Treatment: Gait belt;Oxygen;Rolling walker(3L O2; Rollator)  OT Visit Diagnosis: Unsteadiness on feet (R26.81);Muscle weakness (generalized) (M62.81);Pain Pain - Right/Left: Right Pain - part of body: Knee   Activity Tolerance Patient tolerated treatment well   Patient Left in chair;with call bell/phone within reach;with chair alarm set   Nurse Communication Mobility status        Time: 2641-5830 OT Time Calculation (min): 33 min  Charges: OT General Charges $OT Visit: 1 Visit OT Treatments $Self Care/Home Management : 8-22 mins  Hulda Humphrey OTR/L Acute Rehabilitation Services Pager: [phone] Office: [place] 06/08/2019, 5:38 PM

## 2019-06-08 NOTE — Progress Notes (Signed)
Physical Therapy Treatment Patient Details Name: [name] Ball MRN: [id number] DOB: [birth year] Today's Date: 06/08/2019    History of Present Illness Patient is a 68 y.o. male with PMHx of CLL, HTN, Hypothyroidism, hx of CVA(left thalamic infarct), GERD, COPD/asthma, CKD stage 3 and severe hearing impared/deafness-presented to the hospital with SOB-found to have acute hypoxic resp failure secondary to COVID 19 PNA.     PT Comments    See note for oxygen qualification walk test.  The patient was much safer on $ wheeled RW, he has one at home. Patient ambulated  X 95' x2. Max HR 126, RR 45 Sao2  86% on # L [place] with probe on forehead. Continue PT  For mobility and safety.   Follow Up Recommendations  Home health PT     Equipment Recommendations  None recommended by PT    Recommendations for Other Services       Precautions / Restrictions Precautions Precaution Comments: bad R knee, can buckle, watch sats    Mobility  Bed Mobility         Supine to sit: Supervision     General bed mobility comments: supervision for safety  Transfers Overall transfer level: Needs assistance Equipment used: 4-wheeled walker   Sit to Stand: Min guard         General transfer comment: easily stood from bed and rollator  Ambulation/Gait Ambulation/Gait assistance: Min assist;+2 safety/equipment Gait Distance (Feet): 80 Feet(x 2) Assistive device: 4-wheeled walker Gait Pattern/deviations: Trunk flexed;Step-through pattern Gait velocity: decr   General Gait Details: Much steadier with rollator.   Stairs             Wheelchair Mobility    Modified Rankin (Stroke Patients Only)       Balance     Sitting balance-Leahy Scale: Good     Standing balance support: Bilateral upper extremity supported;During functional activity Standing balance-Leahy Scale: Fair                              Cognition Arousal/Alertness: Awake/alert                                            Exercises      General Comments        Pertinent Vitals/Pain Pain Assessment: Faces Faces Pain Scale: Hurts a little bit Pain Location: R knee Pain Descriptors / Indicators: Constant;Discomfort Pain Intervention(s): Monitored during session;Premedicated before session    Home Living                      Prior Function            PT Goals (current goals can now be found in the care plan section) Progress towards PT goals: Progressing toward goals    Frequency           PT Plan Current plan remains appropriate    Co-evaluation PT/OT/SLP Co-Evaluation/Treatment: Yes Reason for Co-Treatment: For patient/therapist safety   OT goals addressed during session: ADL's and self-care      AM-PAC PT "6 Clicks" Mobility   Outcome Measure  Help needed turning from your back to your side while in a flat bed without using bedrails?: None Help needed moving from lying on your back to sitting on the side of a flat bed without using bedrails?:  None Help needed moving to and from a bed to a chair (including a wheelchair)?: A Little Help needed standing up from a chair using your arms (e.g., wheelchair or bedside chair)?: A Little Help needed to walk in hospital room?: A Lot Help needed climbing 3-5 steps with a railing? : A Lot 6 Click Score: 18    End of Session Equipment Utilized During Treatment: Gait belt;Oxygen Activity Tolerance: Patient tolerated treatment well Patient left: in chair;with call bell/phone within reach Nurse Communication: Mobility status PT Visit Diagnosis: Unsteadiness on feet (R26.81)     Time: 3474-2595 PT Time Calculation (min) (ACUTE ONLY): 30 min  Charges:  $Gait Training: 8-22 mins                     [place] [name] Ball [phone] Office [phone]    [name] Ball 06/08/2019, 1:51 PM

## 2019-06-08 NOTE — Progress Notes (Signed)
PROGRESS NOTE                                                                                                                                                                                                             Patient Demographics:    [name] Ball, is a 68 y.o. male, DOB - [birth year], VEH:[id number]  Outpatient Primary MD for the patient is [facility], Modena Nunnery, MD    LOS - 6  Chief Complaint  Patient presents with  . Shortness of Breath       Brief Narrative: Patient is a 68 y.o. male with PMHx of CLL, HTN, Hypothyroidism, hx of CVA, GERD, COPD/asthma, CKD stage 3 and severe hearing impared/deafness-presented to the hospital with SOB-found to have acute hypoxic resp failure secondary to COVID 19 PNA.   Subjective:    [name] Ball denies any complaints today, he had bowel movement earlier today, he denies chest pain, still reports some dyspnea .   Assessment  & Plan :   Acute Hypoxic Resp Failure due to Covid 19 Viral pneumonia:  - Stable, remains on 2 L nasal cannula this morning, unable to wean any further. -He was encouraged today to use incentive spirometry . - Continue steroids  - Received Remdesivir X5 days  -attempt to titrate down oxygen.  Mobilize with PT-out of bed to chair.  Inflammatory markers are downtrending.Given clinical stability-and minimal oxygen requirement-do not think patient requires Actemra ,  - no convulsant plasma given his Jehovah's Witness. -Has been receiving Lasix as needed, will hold on giving Lasix today, given creatinine up to 1.78 .  COVID-19 Labs:  Recent Labs    06/06/19 0205 06/07/19 0300 06/07/19 0415 06/08/19 0808  DDIMER <0.27 0.29  --   --   FERRITIN 454*  --  420*  --   CRP 2.0*  --  2.8* 1.8*    Lab Results  Component Value Date   SARSCOV2NAA Detected (A) 05/28/2019     COVID-19 Medications: 6/27>> Solumedrol 6/27>>Remdesivir  Hyperkalemia:  -  resolved  - Patient is on Bactrim (prophylaxis-as patient on chronic steroids for CLL).  Hypothyroidism: Continue Synthroid  CKD (chronic kidney disease), stage III ([place]): Remains at baseline-follow  COPD/Asthma: no exacerbation-continue bronchodilators.  GERD (gastroesophageal reflux disease):continue PPI  Essential hypertension:controlled-continue metoprolol-resume Norvasc over the next few days,  Depression:continue celexa,no SI or  hallucinations   CLL (chronic lymphocytic leukemia):  - stable-has significant leukocytosis at baseline. Follow with Oncology on discharge.  Is maintained on 5 mg of prednisolone daily-and on prophylactic Bactrim, prednisone is on hold given he is on IV Solu-Medrol.  Autoimmune hemolytic anemia related to CLL: Hemoglobin stable-on steroids.  History of CVA (cerebrovascular accident):no new focal deficits-continue Plavix  OSA: on CPAP at home-unable to resume during this hospitalization due to hospital protocol. On O2.  Morbid obesity  Severe hearing loss/Deafness  Jehovah's Witness: Refuses all blood product    ABG:    Component Value Date/Time   TCO2 25 03/08/2017 2353    Condition -Stable  Family Communication  : D/W wifr via phone 7/2  Code Status :  Full Code  Diet :  Diet Order            Diet Heart Room service appropriate? Yes; Fluid consistency: Thin  Diet effective now               Disposition Plan  :  Remain inpatient   DVT Prophylaxis  :   Heparin  Lab Results  Component Value Date   PLT 184 06/08/2019    Inpatient Medications  Scheduled Meds: . citalopram  10 mg Oral Daily  . clopidogrel  75 mg Oral Daily  . fluticasone  2 puff Inhalation BID  . folic acid  1 mg Oral Daily  . heparin  5,000 Units Subcutaneous Q8H  . Ipratropium-Albuterol  1 puff Inhalation Q6H  . levothyroxine  50 mcg Oral Q0600  . methylPREDNISolone (SOLU-MEDROL) injection  40 mg Intravenous Q12H  . metoprolol tartrate  12.5  mg Oral BID  . pantoprazole  40 mg Oral Daily  . polyethylene glycol  17 g Oral BID  . sulfamethoxazole-trimethoprim  1 tablet Oral Q M,W,F   Continuous Infusions:  PRN Meds:.acetaminophen, guaiFENesin-dextromethorphan, HYDROcodone-acetaminophen, ondansetron **OR** ondansetron (ZOFRAN) IV  Antibiotics  :    Anti-infectives (From admission, onward)   Start     Dose/Rate Route Frequency Ordered Stop   06/04/19 1000  sulfamethoxazole-trimethoprim (BACTRIM DS) 800-160 MG per tablet 1 tablet     1 tablet Oral Every M-W-F 05/15/2019 1551     06/03/19 2100  remdesivir 100 mg in sodium chloride 0.9 % 250 mL IVPB     100 mg 500 mL/hr over 30 Minutes Intravenous Every 24 hours 05/17/2019 1956 06/06/19 2143   05/15/2019 2100  remdesivir 200 mg in sodium chloride 0.9 % 250 mL IVPB     200 mg 500 mL/hr over 30 Minutes Intravenous Once 05/14/2019 1956 05/22/2019 2315   05/18/2019 1430  vancomycin (VANCOCIN) IVPB 1000 mg/200 mL premix     1,000 mg 200 mL/hr over 60 Minutes Intravenous  Once 06/05/2019 1422 05/09/2019 1547   05/18/2019 1430  piperacillin-tazobactam (ZOSYN) IVPB 3.375 g     3.375 g 100 mL/hr over 30 Minutes Intravenous  Once 06/04/2019 1422 05/14/2019 1514         Objective:   Vitals:   06/08/19 0540 06/08/19 0824 06/08/19 0836 06/08/19 0954  BP: 137/63 140/67    Pulse: 79 84  96  Resp: (!) 22 20  (!) 26  Temp: 98.1 F (36.7 C)  97.6 F (36.4 C)   TempSrc: Oral     SpO2: 90% (!) 88%  90%  Weight:      Height:        Wt Readings from Last 3 Encounters:  06/03/2019 114.4 kg  05/16/19 120.4 kg  04/12/19  120.7 kg     Intake/Output Summary (Last 24 hours) at 06/08/2019 1426 Last data filed at 06/08/2019 0900 Gross per 24 hour  Intake 605 ml  Output 2700 ml  Net -2095 ml     Physical Exam  Awake Alert, Oriented X 3, No new F.N deficits, Normal affect, extremely hard of hearinh Symmetrical Chest wall movement, Good air movement bilaterally, CTAB RRR,No Gallops,Rubs or new Murmurs, No  Parasternal Heave +ve B.Sounds, Abd Soft, No tenderness, No rebound - guarding or rigidity. No Cyanosis, Clubbing or edema, No new Rash or bruise        Data Review:    CBC Recent Labs  Lab 06/03/19 0528 06/04/19 0325 06/05/19 0322 06/06/19 0205 06/07/19 0300 06/08/19 0808  WBC 122.4* 112.3* 81.3* 64.7* 60.1* 84.7*  HGB 8.7* 8.7* 9.0* 8.9* 8.8* 9.6*  HCT 29.6* 29.9* 29.9* 29.3* 29.6* 32.2*  PLT 134* 149* 127* 130* 143* 184  MCV 100.0 100.0 99.3 98.7 98.0 97.3  MCH 29.4 29.1 29.9 30.0 29.1 29.0  MCHC 29.4* 29.1* 30.1 30.4 29.7* 29.8*  RDW 14.5 14.4 14.5 14.4 14.6 14.5  LYMPHSABS 99.6* 90.5* 67.5* 52.8* 46.0*  --   MONOABS 16.2* 14.6* 8.8* 8.0* 10.4*  --   EOSABS 0.0 0.0 0.0 0.0 0.0  --   BASOSABS 0.1 0.2* 0.3* 0.2* 0.1  --     Chemistries  Recent Labs  Lab 06/03/19 0528 06/04/19 0325 06/05/19 0322 06/06/19 0205 06/07/19 0300 06/08/19 0808  NA 137 139 138 137 138 137  K 5.0 5.4* 5.4* 5.6* 5.2* 4.1  CL 105 110 107 107 105 101  CO2 21* 20* 21* 23 24 25   GLUCOSE 185* 178* 153* 157* 169* 134*  BUN 32* 41* 51* 52* 55* 58*  CREATININE 1.69* 1.58* 1.42* 1.40* 1.50* 1.78*  CALCIUM 8.9 9.1 9.2 9.0 8.9 9.1  MG 2.5* 2.5*  --   --   --   --   AST 17 18 14* 18 19 15   ALT 19 18 16 20 28 26   ALKPHOS 58 56 54 46 47 50  BILITOT 0.6 0.2* 0.4 0.5 0.5 0.9   ------------------------------------------------------------------------------------------------------------------ No results for input(s): CHOL, HDL, LDLCALC, TRIG, CHOLHDL, LDLDIRECT in the last 72 hours.  Lab Results  Component Value Date   HGBA1C 5.2 01/12/2019   ------------------------------------------------------------------------------------------------------------------ No results for input(s): TSH, T4TOTAL, T3FREE, THYROIDAB in the last 72 hours.  Invalid input(s): FREET3 ------------------------------------------------------------------------------------------------------------------ Recent Labs     06/06/19 0205 06/07/19 0415  FERRITIN 454* 420*    Coagulation profile No results for input(s): INR, PROTIME in the last 168 hours.  Recent Labs    06/06/19 0205 06/07/19 0300  DDIMER <0.27 0.29    Cardiac Enzymes No results for input(s): CKMB, TROPONINI, MYOGLOBIN in the last 168 hours.  Invalid input(s): CK ------------------------------------------------------------------------------------------------------------------    Component Value Date/Time   BNP 24.4 06/08/2019 0415    Micro Results No results found for this or any previous visit (from the past 240 hour(s)).  Radiology Reports Dg Chest Port 1 View  Result Date: 06/03/2019 CLINICAL DATA:  Shortness of breath EXAM: PORTABLE CHEST 1 VIEW COMPARISON:  Chest x-rays dated 05/14/2019 and 02-07-2016. FINDINGS: Persistent opacity at the LEFT lung base. Additional opacity now noted within the periphery of the LEFT mid lung. Chronic opacities noted at the RIGHT lung base. No pneumothorax seen. Stable cardiomegaly. IMPRESSION: 1. Persistent opacity at the LEFT lung base. Additional opacity now noted within the periphery of the LEFT mid lung. Findings are suspicious  for multifocal pneumonia. Consider chest CT for more definitive characterization. 2. Cardiomegaly. Electronically Signed   By: Franki Cabot M.D.   On: 06/03/2019 08:02   Dg Chest Port 1 View  Result Date: 05/26/2019 CLINICAL DATA:  Shortness of breath and cough EXAM: PORTABLE CHEST 1 VIEW COMPARISON:  June 16, 2016. FINDINGS: There is an area of ill-defined opacity adjacent to the left heart border which is suspicious for an area of pneumonia. Lungs elsewhere clear. Heart is upper normal in size with pulmonary vascularity normal. No adenopathy. No bone lesions. IMPRESSION: Ill-defined opacity adjacent to the left heart border suspicious for pneumonia. Lungs elsewhere clear. Heart upper normal in size. Followup PA and lateral chest radiographs recommended in 3-4 weeks  following trial of antibiotic therapy to ensure resolution and exclude underlying malignancy. Electronically Signed   By: Lowella Grip III M.D.   On: 05/14/2019 14:05    Time Spent in minutes  25   Phillips Climes M.D on 06/06/2019 at 1:35 PM  To page go to [email or web address] - use universal password  [facility] -  Office  [phone]   See all Orders from today for further details   Admit date - 06/01/2019    4

## 2019-06-08 NOTE — Progress Notes (Signed)
SATURATION QUALIFICATIONS: (This note is used to comply with regulatory documentation for home oxygen) Pule ox probe on forehead. Patient Saturations on Room Air at Rest = 83%  Patient Saturations on [employer] while Ambulating = 81%  Patient Saturations on 3 Liters of oxygen while Ambulating = 86% RR 45, HR 126  Please briefly explain why patient needs home oxygen:To keep oxygen saturation > 88% with activity. [place]  Office [phone]

## 2019-06-09 ENCOUNTER — Inpatient Hospital Stay (HOSPITAL_COMMUNITY): Payer: Medicare Other

## 2019-06-09 LAB — COMPREHENSIVE METABOLIC PANEL
ALT: 24 U/L (ref 0–44)
AST: 14 U/L — ABNORMAL LOW (ref 15–41)
Albumin: 3.8 g/dL (ref 3.5–5.0)
Alkaline Phosphatase: 49 U/L (ref 38–126)
Anion gap: 11 (ref 5–15)
BUN: 60 mg/dL — ABNORMAL HIGH (ref 8–23)
CO2: 23 mmol/L (ref 22–32)
Calcium: 9.3 mg/dL (ref 8.9–10.3)
Chloride: 103 mmol/L (ref 98–111)
Creatinine, Ser: 1.64 mg/dL — ABNORMAL HIGH (ref 0.61–1.24)
GFR calc Af Amer: 49 mL/min — ABNORMAL LOW (ref >60)
GFR calc non Af Amer: 42 mL/min — ABNORMAL LOW (ref >60)
Glucose, Bld: 150 mg/dL — ABNORMAL HIGH (ref 70–99)
Potassium: 5.2 mmol/L — ABNORMAL HIGH (ref 3.5–5.1)
Sodium: 137 mmol/L (ref 135–145)
Total Bilirubin: 0.8 mg/dL (ref 0.3–1.2)
Total Protein: 6.3 g/dL — ABNORMAL LOW (ref 6.5–8.1)

## 2019-06-09 LAB — CBC
HCT: 31.6 % — ABNORMAL LOW (ref 39.0–52.0)
Hemoglobin: 9.6 g/dL — ABNORMAL LOW (ref 13.0–17.0)
MCH: 29.7 pg (ref 26.0–34.0)
MCHC: 30.4 g/dL (ref 30.0–36.0)
MCV: 97.8 fL (ref 80.0–100.0)
Platelets: 198 10*3/uL (ref 150–400)
RBC: 3.23 MIL/uL — ABNORMAL LOW (ref 4.22–5.81)
RDW: 14.6 % (ref 11.5–15.5)
WBC: 82.4 10*3/uL (ref 4.0–10.5)
nRBC: 0 % (ref 0.0–0.2)

## 2019-06-09 LAB — C-REACTIVE PROTEIN: CRP: 1.1 mg/dL — ABNORMAL HIGH (ref <1.0)

## 2019-06-09 MED ORDER — SODIUM POLYSTYRENE SULFONATE 15 GM/60ML PO SUSP
30.0000 g | Freq: Once | ORAL | Status: AC
  Administered 2019-06-09: 30 g via ORAL
  Filled 2019-06-09: qty 120

## 2019-06-09 MED ORDER — FUROSEMIDE 10 MG/ML IJ SOLN
40.0000 mg | Freq: Once | INTRAMUSCULAR | Status: AC
  Administered 2019-06-09: 40 mg via INTRAVENOUS
  Filled 2019-06-09: qty 4

## 2019-06-09 MED ORDER — SODIUM ZIRCONIUM CYCLOSILICATE 10 G PO PACK
10.0000 g | PACK | Freq: Three times a day (TID) | ORAL | Status: AC
  Administered 2019-06-09 – 2019-06-10 (×5): 10 g via ORAL
  Filled 2019-06-09 (×5): qty 1

## 2019-06-09 NOTE — Plan of Care (Signed)
Patient lying in bed this morning; no complaints of pain except in right knee which has been ongoing. Pleasant, a&o; HOH in right ear. Will continue to monitor.

## 2019-06-09 NOTE — Progress Notes (Signed)
PROGRESS NOTE                                                                                                                                                                                                             Patient Demographics:    [name] Ball, is a 68 y.o. male, DOB - December 29, 1950, OLM:[id number]  Outpatient Primary MD for the patient is [facility], Modena Nunnery, MD    LOS - 7  Chief Complaint  Patient presents with  . Shortness of Breath       Brief Narrative: Patient is a 68 y.o. male with PMHx of CLL, HTN, Hypothyroidism, hx of CVA, GERD, COPD/asthma, CKD stage 3 and severe hearing impared/deafness-presented to the hospital with SOB-found to have acute hypoxic resp failure secondary to COVID 19 PNA.   Subjective:    Autumn Gunn denies any complaints today, constipation, chest pain, or dyspnea   Assessment  & Plan :   Acute Hypoxic Resp Failure due to Covid 19 Viral pneumonia:  -He is on 2 L nasal cannula recently, this morning he was on 6 L nasal cannula, so I have decreased him to 3 L nasal cannula with saturation 91 to 92%, I have encouraged with his incentive spirometry , - Continue steroids  - Received Remdesivir X5 days  -attempt to titrate down oxygen.  Mobilize with PT-out of bed to chair.  Inflammatory markers are downtrending.Given clinical stability-and minimal oxygen requirement-do not think patient requires Actemra ,  - no convulsant plasma given his Jehovah's Witness. -New with diuresis as needed  COVID-19 Labs:  Recent Labs    06/07/19 0300 06/07/19 0415 06/08/19 0808 06/09/19 0347  DDIMER 0.29  --   --   --   FERRITIN  --  420*  --   --   CRP  --  2.8* 1.8* 1.1*    Lab Results  Component Value Date   SARSCOV2NAA Detected (A) 05/28/2019     COVID-19 Medications: 6/27>> Solumedrol 6/27>>Remdesivir  Hyperkalemia:  -potassium  5.2 today, will give Lasix and Lokelma -  Patient is on Bactrim (prophylaxis-as patient on chronic steroids for CLL).  Hypothyroidism: Continue Synthroid  CKD (chronic kidney disease), stage III ([place]): Remains at baseline-follow  COPD/Asthma: no exacerbation-continue bronchodilators.  GERD (gastroesophageal reflux disease):continue PPI  Essential hypertension:controlled-continue metoprolol-resume Norvasc over the next few days,  Depression:continue  celexa,no SI or hallucinations   CLL (chronic lymphocytic leukemia):  - stable-has significant leukocytosis at baseline. Follow with Oncology on discharge.  Is maintained on 5 mg of prednisolone daily-and on prophylactic Bactrim, prednisone is on hold given he is on IV Solu-Medrol.  Autoimmune hemolytic anemia related to CLL: Hemoglobin stable-on steroids.  History of CVA (cerebrovascular accident):no new focal deficits-continue Plavix  OSA: on CPAP at home-unable to resume during this hospitalization due to hospital protocol. On O2.  Morbid obesity  Severe hearing loss/Deafness  Jehovah's Witness: Refuses all blood product    ABG:    Component Value Date/Time   TCO2 25 03/08/2017 2353    Condition -Stable  Family Communication  : D/W wifr via phone 7/4  Code Status :  Full Code  Diet :  Diet Order            Diet Heart Room service appropriate? Yes; Fluid consistency: Thin  Diet effective now               Disposition Plan  :  Remain inpatient   DVT Prophylaxis  :   Heparin  Lab Results  Component Value Date   PLT 198 06/09/2019    Inpatient Medications  Scheduled Meds: . citalopram  10 mg Oral Daily  . clopidogrel  75 mg Oral Daily  . fluticasone  2 puff Inhalation BID  . folic acid  1 mg Oral Daily  . heparin  5,000 Units Subcutaneous Q8H  . Ipratropium-Albuterol  1 puff Inhalation Q6H  . levothyroxine  50 mcg Oral Q0600  . methylPREDNISolone (SOLU-MEDROL) injection  40 mg Intravenous Q12H  . metoprolol tartrate  12.5 mg Oral BID   . pantoprazole  40 mg Oral Daily  . sulfamethoxazole-trimethoprim  1 tablet Oral Q M,W,F   Continuous Infusions:  PRN Meds:.acetaminophen, guaiFENesin-dextromethorphan, HYDROcodone-acetaminophen, ondansetron **OR** ondansetron (ZOFRAN) IV  Antibiotics  :    Anti-infectives (From admission, onward)   Start     Dose/Rate Route Frequency Ordered Stop   06/04/19 1000  sulfamethoxazole-trimethoprim (BACTRIM DS) 800-160 MG per tablet 1 tablet     1 tablet Oral Every M-W-F 05/17/2019 1551     06/03/19 2100  remdesivir 100 mg in sodium chloride 0.9 % 250 mL IVPB     100 mg 500 mL/hr over 30 Minutes Intravenous Every 24 hours 06/01/2019 1956 06/06/19 2143   05/18/2019 2100  remdesivir 200 mg in sodium chloride 0.9 % 250 mL IVPB     200 mg 500 mL/hr over 30 Minutes Intravenous Once 05/20/2019 1956 05/21/2019 2315   05/14/2019 1430  vancomycin (VANCOCIN) IVPB 1000 mg/200 mL premix     1,000 mg 200 mL/hr over 60 Minutes Intravenous  Once 06/01/2019 1422 05/31/2019 1547   05/19/2019 1430  piperacillin-tazobactam (ZOSYN) IVPB 3.375 g     3.375 g 100 mL/hr over 30 Minutes Intravenous  Once 05/27/2019 1422 05/31/2019 1514         Objective:   Vitals:   06/09/19 0824 06/09/19 0900 06/09/19 1200 06/09/19 1217  BP: 107/63  139/67   Pulse: (!) 101 89 100 (!) 101  Resp: (!) 32 (!) 21 (!) 26 (!) 22  Temp: 98.4 F (36.9 C)     TempSrc: Oral     SpO2: 98% 91% 97% (!) 86%  Weight:      Height:        Wt Readings from Last 3 Encounters:  05/30/2019 114.4 kg  05/16/19 120.4 kg  04/12/19 120.7 kg  Intake/Output Summary (Last 24 hours) at 06/09/2019 1559 Last data filed at 06/09/2019 0825 Gross per 24 hour  Intake 720 ml  Output 1850 ml  Net -1130 ml     Physical Exam  Awake Alert, Oriented X 3, No new F.N deficits, Normal affect Symmetrical Chest wall movement, Good air movement bilaterally, CTAB RRR,No Gallops,Rubs or new Murmurs, No Parasternal Heave +ve B.Sounds, Abd Soft, No tenderness, No rebound  - guarding or rigidity. No Cyanosis, Clubbing or edema, No new Rash or bruise        Data Review:    CBC Recent Labs  Lab 06/03/19 0528 06/04/19 0325 06/05/19 0322 06/06/19 0205 06/07/19 0300 06/08/19 0808 06/09/19 0347  WBC 122.4* 112.3* 81.3* 64.7* 60.1* 84.7* 82.4*  HGB 8.7* 8.7* 9.0* 8.9* 8.8* 9.6* 9.6*  HCT 29.6* 29.9* 29.9* 29.3* 29.6* 32.2* 31.6*  PLT 134* 149* 127* 130* 143* 184 198  MCV 100.0 100.0 99.3 98.7 98.0 97.3 97.8  MCH 29.4 29.1 29.9 30.0 29.1 29.0 29.7  MCHC 29.4* 29.1* 30.1 30.4 29.7* 29.8* 30.4  RDW 14.5 14.4 14.5 14.4 14.6 14.5 14.6  LYMPHSABS 99.6* 90.5* 67.5* 52.8* 46.0*  --   --   MONOABS 16.2* 14.6* 8.8* 8.0* 10.4*  --   --   EOSABS 0.0 0.0 0.0 0.0 0.0  --   --   BASOSABS 0.1 0.2* 0.3* 0.2* 0.1  --   --     Chemistries  Recent Labs  Lab 06/03/19 0528 06/04/19 0325 06/05/19 0322 06/06/19 0205 06/07/19 0300 06/08/19 0808 06/09/19 0347  NA 137 139 138 137 138 137 137  K 5.0 5.4* 5.4* 5.6* 5.2* 4.1 5.2*  CL 105 110 107 107 105 101 103  CO2 21* 20* 21* 23 24 25 23   GLUCOSE 185* 178* 153* 157* 169* 134* 150*  BUN 32* 41* 51* 52* 55* 58* 60*  CREATININE 1.69* 1.58* 1.42* 1.40* 1.50* 1.78* 1.64*  CALCIUM 8.9 9.1 9.2 9.0 8.9 9.1 9.3  MG 2.5* 2.5*  --   --   --   --   --   AST 17 18 14* 18 19 15  14*  ALT 19 18 16 20 28 26 24   ALKPHOS 58 56 54 46 47 50 49  BILITOT 0.6 0.2* 0.4 0.5 0.5 0.9 0.8   ------------------------------------------------------------------------------------------------------------------ No results for input(s): CHOL, HDL, LDLCALC, TRIG, CHOLHDL, LDLDIRECT in the last 72 hours.  Lab Results  Component Value Date   HGBA1C 5.2 01/12/2019   ------------------------------------------------------------------------------------------------------------------ No results for input(s): TSH, T4TOTAL, T3FREE, THYROIDAB in the last 72 hours.  Invalid input(s): FREET3  ------------------------------------------------------------------------------------------------------------------ Recent Labs    06/07/19 0415  FERRITIN 420*    Coagulation profile No results for input(s): INR, PROTIME in the last 168 hours.  Recent Labs    06/07/19 0300  DDIMER 0.29    Cardiac Enzymes No results for input(s): CKMB, TROPONINI, MYOGLOBIN in the last 168 hours.  Invalid input(s): CK ------------------------------------------------------------------------------------------------------------------    Component Value Date/Time   BNP 24.4 06/08/2019 0415    Micro Results No results found for this or any previous visit (from the past 240 hour(s)).  Radiology Reports Dg Chest Port 1 View  Result Date: 06/09/2019 CLINICAL DATA:  Acute hypoxic respiratory failure. EXAM: PORTABLE CHEST 1 VIEW COMPARISON:  Radiograph June 03, 2019. FINDINGS: Stable cardiomegaly. No pneumothorax is noted. Stable bibasilar opacities are noted concerning for infiltrates or multifocal pneumonia. Small left pleural effusion cannot be excluded. Bony thorax is unremarkable. IMPRESSION: Stable bibasilar opacities are noted  concerning for multifocal pneumonia. Possible small left pleural effusion may be present. Electronically Signed   By: Marijo Conception M.D.   On: 06/09/2019 11:43   Dg Chest Port 1 View  Result Date: 06/03/2019 CLINICAL DATA:  Shortness of breath EXAM: PORTABLE CHEST 1 VIEW COMPARISON:  Chest x-rays dated 05/09/2019 and 2020/04/1616. FINDINGS: Persistent opacity at the LEFT lung base. Additional opacity now noted within the periphery of the LEFT mid lung. Chronic opacities noted at the RIGHT lung base. No pneumothorax seen. Stable cardiomegaly. IMPRESSION: 1. Persistent opacity at the LEFT lung base. Additional opacity now noted within the periphery of the LEFT mid lung. Findings are suspicious for multifocal pneumonia. Consider chest CT for more definitive characterization. 2.  Cardiomegaly. Electronically Signed   By: Franki Cabot M.D.   On: 06/03/2019 08:02   Dg Chest Port 1 View  Result Date: 05/25/2019 CLINICAL DATA:  Shortness of breath and cough EXAM: PORTABLE CHEST 1 VIEW COMPARISON:  June 16, 2016. FINDINGS: There is an area of ill-defined opacity adjacent to the left heart border which is suspicious for an area of pneumonia. Lungs elsewhere clear. Heart is upper normal in size with pulmonary vascularity normal. No adenopathy. No bone lesions. IMPRESSION: Ill-defined opacity adjacent to the left heart border suspicious for pneumonia. Lungs elsewhere clear. Heart upper normal in size. Followup PA and lateral chest radiographs recommended in 3-4 weeks following trial of antibiotic therapy to ensure resolution and exclude underlying malignancy. Electronically Signed   By: Lowella Grip III M.D.   On: 05/24/2019 14:05    Time Spent in minutes  25   Phillips Climes M.D on 06/06/2019 at 1:35 PM  To page go to [email or web address] - use universal password  [facility] -  Office  [phone]   See all Orders from today for further details   Admit date - 06/03/2019    4

## 2019-06-09 NOTE — Progress Notes (Addendum)
Pt sleeping.  No s/sx of distress noted.  CLWR.  0177  Pt sleeping.  No s/sx of distress noted.  CLWR.

## 2019-06-10 LAB — COMPREHENSIVE METABOLIC PANEL
ALT: 24 U/L (ref 0–44)
AST: 14 U/L — ABNORMAL LOW (ref 15–41)
Albumin: 3.8 g/dL (ref 3.5–5.0)
Alkaline Phosphatase: 50 U/L (ref 38–126)
Anion gap: 13 (ref 5–15)
BUN: 60 mg/dL — ABNORMAL HIGH (ref 8–23)
CO2: 25 mmol/L (ref 22–32)
Calcium: 8.9 mg/dL (ref 8.9–10.3)
Chloride: 100 mmol/L (ref 98–111)
Creatinine, Ser: 1.74 mg/dL — ABNORMAL HIGH (ref 0.61–1.24)
GFR calc Af Amer: 45 mL/min — ABNORMAL LOW (ref >60)
GFR calc non Af Amer: 39 mL/min — ABNORMAL LOW (ref >60)
Glucose, Bld: 157 mg/dL — ABNORMAL HIGH (ref 70–99)
Potassium: 4.4 mmol/L (ref 3.5–5.1)
Sodium: 138 mmol/L (ref 135–145)
Total Bilirubin: 0.7 mg/dL (ref 0.3–1.2)
Total Protein: 6.3 g/dL — ABNORMAL LOW (ref 6.5–8.1)

## 2019-06-10 LAB — C-REACTIVE PROTEIN: CRP: 1.4 mg/dL — ABNORMAL HIGH (ref <1.0)

## 2019-06-10 LAB — CBC
HCT: 32.8 % — ABNORMAL LOW (ref 39.0–52.0)
Hemoglobin: 9.6 g/dL — ABNORMAL LOW (ref 13.0–17.0)
MCH: 29 pg (ref 26.0–34.0)
MCHC: 29.3 g/dL — ABNORMAL LOW (ref 30.0–36.0)
MCV: 99.1 fL (ref 80.0–100.0)
Platelets: 216 10*3/uL (ref 150–400)
RBC: 3.31 MIL/uL — ABNORMAL LOW (ref 4.22–5.81)
RDW: 14.7 % (ref 11.5–15.5)
WBC: 95.5 10*3/uL (ref 4.0–10.5)
nRBC: 0 % (ref 0.0–0.2)

## 2019-06-10 MED ORDER — METHYLPREDNISOLONE SODIUM SUCC 40 MG IJ SOLR
40.0000 mg | Freq: Every day | INTRAMUSCULAR | Status: DC
  Administered 2019-06-11 – 2019-06-12 (×2): 40 mg via INTRAVENOUS
  Filled 2019-06-10 (×2): qty 1

## 2019-06-10 NOTE — TOC Transition Note (Signed)
Transition of Care [facility]) - CM/SW Discharge Note   Patient Details  Name: [name] Ball MRN: [id number] Date of Birth: 1951-10-01  Transition of Care [facility]) CM/SW Contact:  Ninfa Meeker, RN Phone [phone] (working remotely) 06/10/2019, 3:17 PM   Clinical Narrative:  Patient is 68 yr old gentleman admitted and treated for COVID 19. Thankfully patient is improving and will discharge home on Monday, with Home Health services and oxygen. Patient is extremely HOH, case manager contacted his wife, [phone] and offered choice for [facility] and DME providers. Referral was called to Adela Lank with [facility] and Learta Codding with [facility]. Orders were faxed via Epic to [place]. Patient has RW at home and will have family support at discharge.    Final next level of care: Home w Home Health Services Barriers to Discharge: No Barriers Identified   Patient Goals and CMS Choice   CMS Medicare.gov Compare Post Acute Care list provided to:: Other (Comment Required)(Case manager spoke with patient's wife, [name] Ball) Choice offered to / list presented to : Spouse  Discharge Placement: Home                       Discharge Plan and Services   Discharge Planning Services: CM Consult Post Acute Care Choice: Durable Medical Equipment, Home Health          DME Arranged: Oxygen DME Agency: [place] Date DME Agency Contacted: 06/10/19 Time DME Agency Contacted: 1509 Representative spoke with at DME Agency: Learta Codding [place]: PT [place] Agency: [place] Date [place]: 06/10/19 Time [place]: 9518 Representative spoke with at [place]: [place] ([place]) Interventions     Readmission Risk Interventions No flowsheet data found.

## 2019-06-10 NOTE — Progress Notes (Signed)
PROGRESS NOTE                                                                                                                                                                                                             Patient Demographics:    [name] Ball, is a 68 y.o. male, DOB - Nov 20, 1951, GNF:[id number]  Outpatient Primary MD for the patient is [facility], [place] Nunnery, MD    LOS - 8  Chief Complaint  Patient presents with  . Shortness of Breath       Brief Narrative: Patient is a 68 y.o. male with PMHx of CLL, HTN, Hypothyroidism, hx of CVA, GERD, COPD/asthma, CKD stage 3 and severe hearing impared/deafness-presented to the hospital with SOB-found to have acute hypoxic resp failure secondary to COVID 19 PNA.   Subjective:    [name] Ball denies any complaints today, constipation, chest pain, or dyspnea   Assessment  & Plan :   Acute Hypoxic Resp Failure due to Covid 19 Viral pneumonia:  -[place] on 2 L nasal cannula, unable to wean, likely will need to go home with home oxygen as it is expected to take prolonged.  To wean back to room air . - Continue steroids , start taper - Received Remdesivir X5 days  -attempt to titrate down oxygen.  Mobilize with PT-out of bed to chair.  Inflammatory markers are downtrending.Given clinical stability-and minimal oxygen requirement-do not think patient requires Actemra ,  - no convulsant plasma given his Jehovah's Witness. -New with diuresis as needed  COVID-19 Labs:  Recent Labs    06/08/19 0808 06/09/19 0347 06/10/19 0436  CRP 1.8* 1.1* 1.4*    Lab Results  Component Value Date   SARSCOV2NAA Detected (A) 05/28/2019     COVID-19 Medications: 6/27>> Solumedrol 6/27>>Remdesivir  Hyperkalemia:  -resolved, received lokelma - Patient is on Bactrim (prophylaxis-as patient on chronic steroids for CLL).  Hypothyroidism: Continue Synthroid  CKD (chronic kidney  disease), stage III ([place]): Remains at baseline-follow  COPD/Asthma: no exacerbation-continue bronchodilators.  GERD (gastroesophageal reflux disease):continue PPI  Essential hypertension:controlled-continue metoprolol-resume Norvasc over the next few days,  Depression:continue celexa,no SI or hallucinations   CLL (chronic lymphocytic leukemia):  - stable-has significant leukocytosis at baseline. Follow with Oncology on discharge.  Is maintained on 5 mg of prednisolone daily-and on prophylactic Bactrim, prednisone is on hold given he  is on IV Solu-Medrol.  Autoimmune hemolytic anemia related to CLL: Hemoglobin stable-on steroids.  History of CVA (cerebrovascular accident):no new focal deficits-continue Plavix  OSA: on CPAP at home-unable to resume during this hospitalization due to hospital protocol. On O2.  Morbid obesity  Severe hearing loss/Deafness  Jehovah's Witness: Refuses all blood product    ABG:    Component Value Date/Time   TCO2 25 03/08/2017 2353    Condition -Stable  Family Communication  : D/W wifr via phone 7/4  Code Status :  Full Code  Diet :  Diet Order            Diet Heart Room service appropriate? Yes; Fluid consistency: Thin  Diet effective now               Disposition Plan  :  Remain inpatient   DVT Prophylaxis  :   Heparin  Lab Results  Component Value Date   PLT 216 06/10/2019    Inpatient Medications  Scheduled Meds: . citalopram  10 mg Oral Daily  . clopidogrel  75 mg Oral Daily  . fluticasone  2 puff Inhalation BID  . folic acid  1 mg Oral Daily  . heparin  5,000 Units Subcutaneous Q8H  . Ipratropium-Albuterol  1 puff Inhalation Q6H  . levothyroxine  50 mcg Oral Q0600  . methylPREDNISolone (SOLU-MEDROL) injection  40 mg Intravenous Q12H  . metoprolol tartrate  12.5 mg Oral BID  . pantoprazole  40 mg Oral Daily  . sodium zirconium cyclosilicate  10 g Oral TID  . sulfamethoxazole-trimethoprim  1 tablet Oral Q  M,W,F   Continuous Infusions:  PRN Meds:.acetaminophen, guaiFENesin-dextromethorphan, HYDROcodone-acetaminophen, ondansetron **OR** ondansetron (ZOFRAN) IV  Antibiotics  :    Anti-infectives (From admission, onward)   Start     Dose/Rate Route Frequency Ordered Stop   06/04/19 1000  sulfamethoxazole-trimethoprim (BACTRIM DS) 800-160 MG per tablet 1 tablet     1 tablet Oral Every M-W-F 06/01/2019 1551     06/03/19 2100  remdesivir 100 mg in sodium chloride 0.9 % 250 mL IVPB     100 mg 500 mL/hr over 30 Minutes Intravenous Every 24 hours 05/08/2019 1956 06/06/19 2143   05/22/2019 2100  remdesivir 200 mg in sodium chloride 0.9 % 250 mL IVPB     200 mg 500 mL/hr over 30 Minutes Intravenous Once 05/29/2019 1956 05/31/2019 2315   05/27/2019 1430  vancomycin (VANCOCIN) IVPB 1000 mg/200 mL premix     1,000 mg 200 mL/hr over 60 Minutes Intravenous  Once 05/09/2019 1422 05/22/2019 1547   05/17/2019 1430  piperacillin-tazobactam (ZOSYN) IVPB 3.375 g     3.375 g 100 mL/hr over 30 Minutes Intravenous  Once 06/01/2019 1422 05/27/2019 1514         Objective:   Vitals:   06/10/19 0800 06/10/19 1150 06/10/19 1154 06/10/19 1200  BP: (!) 151/58  135/60 (!) 125/58  Pulse: 82 95 86 87  Resp: (!) 23 (!) 21 18 (!) 23  Temp: 97.9 F (36.6 C)   98 F (36.7 C)  TempSrc:    Oral  SpO2: (!) 89% (S) (!) 84% 96% 92%  Weight:      Height:        Wt Readings from Last 3 Encounters:  05/21/2019 114.4 kg  05/16/19 120.4 kg  04/12/19 120.7 kg     Intake/Output Summary (Last 24 hours) at 06/10/2019 1604 Last data filed at 06/10/2019 1500 Gross per 24 hour  Intake 440 ml  Output 1000  ml  Net -560 ml     Physical Exam  Awake Alert, Oriented X 3, No new F.N deficits, Normal affect Symmetrical Chest wall movement, Good air movement bilaterally, CTAB RRR,No Gallops,Rubs or new Murmurs, No Parasternal Heave +ve B.Sounds, Abd Soft, No tenderness, No rebound - guarding or rigidity. No Cyanosis, Clubbing or edema, No new  Rash or bruise       Data Review:    CBC Recent Labs  Lab 06/04/19 0325 06/05/19 0322 06/06/19 0205 06/07/19 0300 06/08/19 0808 06/09/19 0347 06/10/19 0436  WBC 112.3* 81.3* 64.7* 60.1* 84.7* 82.4* 95.5*  HGB 8.7* 9.0* 8.9* 8.8* 9.6* 9.6* 9.6*  HCT 29.9* 29.9* 29.3* 29.6* 32.2* 31.6* 32.8*  PLT 149* 127* 130* 143* 184 198 216  MCV 100.0 99.3 98.7 98.0 97.3 97.8 99.1  MCH 29.1 29.9 30.0 29.1 29.0 29.7 29.0  MCHC 29.1* 30.1 30.4 29.7* 29.8* 30.4 29.3*  RDW 14.4 14.5 14.4 14.6 14.5 14.6 14.7  LYMPHSABS 90.5* 67.5* 52.8* 46.0*  --   --   --   MONOABS 14.6* 8.8* 8.0* 10.4*  --   --   --   EOSABS 0.0 0.0 0.0 0.0  --   --   --   BASOSABS 0.2* 0.3* 0.2* 0.1  --   --   --     Chemistries  Recent Labs  Lab 06/04/19 0325  06/06/19 0205 06/07/19 0300 06/08/19 0808 06/09/19 0347 06/10/19 0436  NA 139   < > 137 138 137 137 138  K 5.4*   < > 5.6* 5.2* 4.1 5.2* 4.4  CL 110   < > 107 105 101 103 100  CO2 20*   < > 23 24 25 23 25   GLUCOSE 178*   < > 157* 169* 134* 150* 157*  BUN 41*   < > 52* 55* 58* 60* 60*  CREATININE 1.58*   < > 1.40* 1.50* 1.78* 1.64* 1.74*  CALCIUM 9.1   < > 9.0 8.9 9.1 9.3 8.9  MG 2.5*  --   --   --   --   --   --   AST 18   < > 18 19 15  14* 14*  ALT 18   < > 20 28 26 24 24   ALKPHOS 56   < > 46 47 50 49 50  BILITOT 0.2*   < > 0.5 0.5 0.9 0.8 0.7   < > = values in this interval not displayed.   ------------------------------------------------------------------------------------------------------------------ No results for input(s): CHOL, HDL, LDLCALC, TRIG, CHOLHDL, LDLDIRECT in the last 72 hours.  Lab Results  Component Value Date   HGBA1C 5.2 01/12/2019   ------------------------------------------------------------------------------------------------------------------ No results for input(s): TSH, T4TOTAL, T3FREE, THYROIDAB in the last 72 hours.  Invalid input(s): FREET3  ------------------------------------------------------------------------------------------------------------------ No results for input(s): VITAMINB12, FOLATE, FERRITIN, TIBC, IRON, RETICCTPCT in the last 72 hours.  Coagulation profile No results for input(s): INR, PROTIME in the last 168 hours.  No results for input(s): DDIMER in the last 72 hours.  Cardiac Enzymes No results for input(s): CKMB, TROPONINI, MYOGLOBIN in the last 168 hours.  Invalid input(s): CK ------------------------------------------------------------------------------------------------------------------    Component Value Date/Time   BNP 24.4 06/08/2019 0415    Micro Results No results found for this or any previous visit (from the past 240 hour(s)).  Radiology Reports Dg Chest Port 1 View  Result Date: 06/09/2019 CLINICAL DATA:  Acute hypoxic respiratory failure. EXAM: PORTABLE CHEST 1 VIEW COMPARISON:  Radiograph June 03, 2019. FINDINGS: Stable cardiomegaly. No  pneumothorax is noted. Stable bibasilar opacities are noted concerning for infiltrates or multifocal pneumonia. Small left pleural effusion cannot be excluded. Bony thorax is unremarkable. IMPRESSION: Stable bibasilar opacities are noted concerning for multifocal pneumonia. Possible small left pleural effusion may be present. Electronically Signed   By: Marijo Conception M.D.   On: 06/09/2019 11:43   Dg Chest Port 1 View  Result Date: 06/03/2019 CLINICAL DATA:  Shortness of breath EXAM: PORTABLE CHEST 1 VIEW COMPARISON:  Chest x-rays dated 05/27/2019 and Nov 22, 202017. FINDINGS: Persistent opacity at the LEFT lung base. Additional opacity now noted within the periphery of the LEFT mid lung. Chronic opacities noted at the RIGHT lung base. No pneumothorax seen. Stable cardiomegaly. IMPRESSION: 1. Persistent opacity at the LEFT lung base. Additional opacity now noted within the periphery of the LEFT mid lung. Findings are suspicious for multifocal pneumonia. Consider  chest CT for more definitive characterization. 2. Cardiomegaly. Electronically Signed   By: Franki Cabot M.D.   On: 06/03/2019 08:02   Dg Chest Port 1 View  Result Date: 05/22/2019 CLINICAL DATA:  Shortness of breath and cough EXAM: PORTABLE CHEST 1 VIEW COMPARISON:  June 16, 2016. FINDINGS: There is an area of ill-defined opacity adjacent to the left heart border which is suspicious for an area of pneumonia. Lungs elsewhere clear. Heart is upper normal in size with pulmonary vascularity normal. No adenopathy. No bone lesions. IMPRESSION: Ill-defined opacity adjacent to the left heart border suspicious for pneumonia. Lungs elsewhere clear. Heart upper normal in size. Followup PA and lateral chest radiographs recommended in 3-4 weeks following trial of antibiotic therapy to ensure resolution and exclude underlying malignancy. Electronically Signed   By: Lowella Grip III M.D.   On: 06/01/2019 14:05    Time Spent in minutes  25   Phillips Climes M.D on 06/06/2019 at 1:35 PM  To page go to [email or web address] - use universal password  [facility] -  Office  [phone]   See all Orders from today for further details   Admit date - 05/10/2019    4

## 2019-06-10 NOTE — Progress Notes (Signed)
Pts oxygenation on room air resting drops to 84%, on 2 liters nasal cannula pt is 92-96%. Pts oxygen level decreases to 84-90% during activity on 2 liters nasal cannula, pt does recover well once resting.

## 2019-06-10 NOTE — Progress Notes (Signed)
Patient had an uneventful night. Oxygen remains on 3L via nasal canula.

## 2019-06-11 ENCOUNTER — Inpatient Hospital Stay (HOSPITAL_COMMUNITY): Payer: Medicare Other

## 2019-06-11 DIAGNOSIS — J9621 Acute and chronic respiratory failure with hypoxia: Secondary | ICD-10-CM

## 2019-06-11 LAB — COMPREHENSIVE METABOLIC PANEL
ALT: 19 U/L (ref 0–44)
AST: 15 U/L (ref 15–41)
Albumin: 3.5 g/dL (ref 3.5–5.0)
Alkaline Phosphatase: 46 U/L (ref 38–126)
Anion gap: 9 (ref 5–15)
BUN: 56 mg/dL — ABNORMAL HIGH (ref 8–23)
CO2: 26 mmol/L (ref 22–32)
Calcium: 8.9 mg/dL (ref 8.9–10.3)
Chloride: 103 mmol/L (ref 98–111)
Creatinine, Ser: 1.78 mg/dL — ABNORMAL HIGH (ref 0.61–1.24)
GFR calc Af Amer: 44 mL/min — ABNORMAL LOW (ref >60)
GFR calc non Af Amer: 38 mL/min — ABNORMAL LOW (ref >60)
Glucose, Bld: 134 mg/dL — ABNORMAL HIGH (ref 70–99)
Potassium: 3.7 mmol/L (ref 3.5–5.1)
Sodium: 138 mmol/L (ref 135–145)
Total Bilirubin: 0.9 mg/dL (ref 0.3–1.2)
Total Protein: 6.1 g/dL — ABNORMAL LOW (ref 6.5–8.1)

## 2019-06-11 LAB — C-REACTIVE PROTEIN: CRP: 6.6 mg/dL — ABNORMAL HIGH (ref <1.0)

## 2019-06-11 LAB — POCT I-STAT EG7
Bicarbonate: 22.8 mmol/L (ref 20.0–28.0)
Calcium, Ion: 1.2 mmol/L (ref 1.15–1.40)
HCT: 31 % — ABNORMAL LOW (ref 39.0–52.0)
Hemoglobin: 10.5 g/dL — ABNORMAL LOW (ref 13.0–17.0)
O2 Saturation: 57 %
Patient temperature: 98.7
Potassium: 4.4 mmol/L (ref 3.5–5.1)
Sodium: 134 mmol/L — ABNORMAL LOW (ref 135–145)
TCO2: 24 mmol/L (ref 22–32)
pCO2, Ven: 31.3 mmHg — ABNORMAL LOW (ref 44.0–60.0)
pH, Ven: 7.47 — ABNORMAL HIGH (ref 7.250–7.430)
pO2, Ven: 28 mmHg — CL (ref 32.0–45.0)

## 2019-06-11 LAB — POCT I-STAT 7, (LYTES, BLD GAS, ICA,H+H)
Acid-Base Excess: 1 mmol/L (ref 0.0–2.0)
Bicarbonate: 22.7 mmol/L (ref 20.0–28.0)
Calcium, Ion: 1.22 mmol/L (ref 1.15–1.40)
HCT: 33 % — ABNORMAL LOW (ref 39.0–52.0)
Hemoglobin: 11.2 g/dL — ABNORMAL LOW (ref 13.0–17.0)
O2 Saturation: 71 %
Patient temperature: 98.7
Potassium: 4.4 mmol/L (ref 3.5–5.1)
Sodium: 133 mmol/L — ABNORMAL LOW (ref 135–145)
TCO2: 23 mmol/L (ref 22–32)
pCO2 arterial: 25.7 mmHg — ABNORMAL LOW (ref 32.0–48.0)
pH, Arterial: 7.554 — ABNORMAL HIGH (ref 7.350–7.450)
pO2, Arterial: 31 mmHg — CL (ref 83.0–108.0)

## 2019-06-11 LAB — CBC
HCT: 32 % — ABNORMAL LOW (ref 39.0–52.0)
Hemoglobin: 10 g/dL — ABNORMAL LOW (ref 13.0–17.0)
MCH: 30.2 pg (ref 26.0–34.0)
MCHC: 31.3 g/dL (ref 30.0–36.0)
MCV: 96.7 fL (ref 80.0–100.0)
Platelets: 228 10*3/uL (ref 150–400)
RBC: 3.31 MIL/uL — ABNORMAL LOW (ref 4.22–5.81)
RDW: 14.7 % (ref 11.5–15.5)
WBC: 95.5 10*3/uL (ref 4.0–10.5)
nRBC: 0 % (ref 0.0–0.2)

## 2019-06-11 LAB — PROCALCITONIN: Procalcitonin: 0.24 ng/mL

## 2019-06-11 LAB — D-DIMER, QUANTITATIVE: D-Dimer, Quant: 0.33 ug/mL-FEU (ref 0.00–0.50)

## 2019-06-11 MED ORDER — IPRATROPIUM-ALBUTEROL 0.5-2.5 (3) MG/3ML IN SOLN
3.0000 mL | Freq: Four times a day (QID) | RESPIRATORY_TRACT | Status: DC
  Administered 2019-06-11 – 2019-06-12 (×3): 3 mL via RESPIRATORY_TRACT
  Filled 2019-06-11 (×2): qty 3

## 2019-06-11 MED ORDER — CHLORHEXIDINE GLUCONATE 0.12 % MT SOLN
15.0000 mL | Freq: Two times a day (BID) | OROMUCOSAL | Status: DC
  Administered 2019-06-11 – 2019-06-16 (×10): 15 mL via OROMUCOSAL
  Filled 2019-06-11 (×8): qty 15

## 2019-06-11 MED ORDER — SODIUM CHLORIDE 0.9 % IV SOLN
100.0000 mg | INTRAVENOUS | Status: DC
  Administered 2019-06-11 – 2019-06-12 (×2): 100 mg via INTRAVENOUS
  Filled 2019-06-11 (×2): qty 20

## 2019-06-11 MED ORDER — FUROSEMIDE 10 MG/ML IJ SOLN
40.0000 mg | Freq: Once | INTRAMUSCULAR | Status: AC
  Administered 2019-06-11: 40 mg via INTRAVENOUS
  Filled 2019-06-11: qty 4

## 2019-06-11 MED ORDER — ORAL CARE MOUTH RINSE
15.0000 mL | Freq: Two times a day (BID) | OROMUCOSAL | Status: DC
  Administered 2019-06-11 – 2019-06-12 (×3): 15 mL via OROMUCOSAL

## 2019-06-11 MED ORDER — BUDESONIDE 0.25 MG/2ML IN SUSP
0.2500 mg | Freq: Two times a day (BID) | RESPIRATORY_TRACT | Status: DC
  Administered 2019-06-11 – 2019-06-12 (×2): 0.25 mg via RESPIRATORY_TRACT
  Filled 2019-06-11 (×2): qty 2

## 2019-06-11 MED ORDER — CHLORHEXIDINE GLUCONATE CLOTH 2 % EX PADS
6.0000 | MEDICATED_PAD | Freq: Every day | CUTANEOUS | Status: DC
  Administered 2019-06-11 – 2019-06-16 (×6): 6 via TOPICAL

## 2019-06-11 NOTE — Consult Note (Signed)
NAME:  EMMAUS BRANDI, MRN:  [id number], DOB:  [birth year], LOS: 9 ADMISSION DATE:  05/07/2019, CONSULTATION DATE:  06/11/2019 REFERRING MD:  Dr Ovid Curd - [facility], CHIEF COMPLAINT:  Acute on chronic resp failure  PCP Alycia Rossetti, MD     Brief History   See HPI  History of present illness   68 year old obese male with hard of hearing and does well with sign language and if talk loudly into his year.  He can read and write.  There is a family outbreak with COVID-19 .  History from his wife.  She reports that he tested positive on 05/28/2019 due to household covid contact (daughter). Asymptomatic then and got symptomatic on 06/01/2019 and following day June 01, 2018 got admitted he declined convalescent plasma because of Jehovah witness.  He was initially improving and about to be discharged for June 12, 2019 but on June 11, 2019 he became significantly hypoxemic requiring 15 L high flow nasal cannula.  He only had minimal symptoms and is described as "happy hypoxemic".  Chest x-ray shows significant worsening of the left basal infiltrates.  There is no fever.  His initial CRP at admission June 02, 2019 was 18.1 with a peak on June 03, 2019 was 19.2 and reaching a nadir of 1.4 on June 10, 2019 and rising up to 6.6 on June 11, 2019 at the time of transfer to the ICU.  He only has minimal increased work of breathing.  His procalcitonin at time of transfer was 0.24 and his d-dimer was 0.33  Immune status  -CLL and autoimmune hemolytic anemia sine June 2019- chronic prednisone x 1 year with chronic bactrim for pcp prophylaxis x 1 years - per wife -  reports of tb and hepatis in [place] check negative per wife last checked 10-15 years ago - CKD - 3 with creat 1.4 t0 1.7 - left nephrectomy RCC in 2005. Rx procrit = Chronic COPD-?  A few liters of oxygen -?  Sleep apnea -wife says that he uses CPAP/BiPAP at home at night  Past Medical History     has a past medical history of Allergy, Arthritis,  Asthma, Cancer ([place]), CLL (chronic lymphocytic leukemia) ([place]) (2016), COPD (chronic obstructive pulmonary disease) ([place]), COVID-19, Diverticulitis, Diverticulosis, GERD (gastroesophageal reflux disease), Hearing impaired, Hypertension, Low testosterone, Migraine, Shingles (03/31/2016), Solar purpura ([place]), Thyroid disease, Ulcer, and Ventral hernia (FEB 2015 CT ).   reports that he quit smoking about 22 years ago. He has never used smokeless tobacco.  Past Surgical History:  Procedure Laterality Date  . abdominal mesh inserted  june 2006, may 2008,dec 2010  . CHOLECYSTECTOMY  june 2007  . COLON SURGERY  sept 2007  . COLONOSCOPY    . COLONOSCOPY N/A 02/20/2014   Procedure: COLONOSCOPY;  Surgeon: Danie Binder, MD;  Location: [facility];  Service: Endoscopy;  Laterality: N/A;  . HERNIA REPAIR  05/2005 5/20108, 11/2009   with mesh  . KIDNEY CYST REMOVAL  jan 2005   renal cell carcinoma  . knee minescus repair Left feb 2007  . NEPHRECTOMY Left 12/2003    Allergies  Allergen Reactions  . Contrast Media [Iodinated Diagnostic Agents]     Single functioning kidney  . Fish Allergy   . Other Hives    Neoprene fabric Strawberry   . Ether Rash    Took a long time to wake up Doesn't wake up from it    Immunization History  Administered Date(s) Administered  . Influenza, High  Dose Seasonal PF 09/18/2018  . Influenza,inj,Quad PF,6+ Mos 08/09/2014, 11/21/2015, 08/31/2016  . Influenza-Unspecified 09/05/2013, 09/28/2017  . Pneumococcal Conjugate-13 11/26/2015  . Pneumococcal Polysaccharide-23 08/01/2009  . Td 04/05/2009  . Tdap 04/05/2009    Family History  Problem Relation Age of Onset  . Cancer Mother        lung, met to brain  . Diabetes Mellitus II Sister   . Colon cancer Neg Hx      Current Facility-Administered Medications:  .  acetaminophen (TYLENOL) tablet 650 mg, 650 mg, Oral, Q6H PRN, Barton Dubois, MD, 650 mg at 06/09/19 2115 .  budesonide (PULMICORT) nebulizer  solution 0.25 mg, 0.25 mg, Nebulization, BID, Ramaswamy, Murali, MD .  chlorhexidine (PERIDEX) 0.12 % solution 15 mL, 15 mL, Mouth Rinse, BID, Elgergawy, Silver Huguenin, MD .  Chlorhexidine Gluconate Cloth 2 % PADS 6 each, 6 each, Topical, Daily, Elgergawy, Silver Huguenin, MD .  citalopram (CELEXA) tablet 10 mg, 10 mg, Oral, Daily, Barton Dubois, MD, 10 mg at 06/11/19 6789 .  clopidogrel (PLAVIX) tablet 75 mg, 75 mg, Oral, Daily, Barton Dubois, MD, 75 mg at 06/11/19 0820 .  folic acid (FOLVITE) tablet 1 mg, 1 mg, Oral, Daily, Barton Dubois, MD, 1 mg at 06/11/19 3810 .  guaiFENesin-dextromethorphan (ROBITUSSIN DM) 100-10 MG/5ML syrup 10 mL, 10 mL, Oral, Q4H PRN, Ghimire, Henreitta Leber, MD, 10 mL at 06/04/19 2153 .  heparin injection 5,000 Units, 5,000 Units, Subcutaneous, Q8H, Barton Dubois, MD, 5,000 Units at 06/11/19 1243 .  HYDROcodone-acetaminophen (NORCO/VICODIN) 5-325 MG per tablet 2 tablet, 2 tablet, Oral, Q8H PRN, Jonetta Osgood, MD, 2 tablet at 06/10/19 0848 .  ipratropium-albuterol (DUONEB) 0.5-2.5 (3) MG/3ML nebulizer solution 3 mL, 3 mL, Nebulization, Q6H, Ramaswamy, Murali, MD .  levothyroxine (SYNTHROID) tablet 50 mcg, 50 mcg, Oral, Q0600, Barton Dubois, MD, 50 mcg at 06/11/19 0552 .  MEDLINE mouth rinse, 15 mL, Mouth Rinse, q12n4p, Elgergawy, Emeline Gins S, MD .  methylPREDNISolone sodium succinate (SOLU-MEDROL) 40 mg/mL injection 40 mg, 40 mg, Intravenous, Daily, Elgergawy, Silver Huguenin, MD, 40 mg at 06/11/19 1751 .  metoprolol tartrate (LOPRESSOR) tablet 12.5 mg, 12.5 mg, Oral, BID, Barton Dubois, MD, 12.5 mg at 06/11/19 0258 .  ondansetron (ZOFRAN) tablet 4 mg, 4 mg, Oral, Q6H PRN **OR** ondansetron (ZOFRAN) injection 4 mg, 4 mg, Intravenous, Q6H PRN, Barton Dubois, MD, 4 mg at 06/09/19 1251 .  pantoprazole (PROTONIX) EC tablet 40 mg, 40 mg, Oral, Daily, Barton Dubois, MD, 40 mg at 06/11/19 0820 .  sulfamethoxazole-trimethoprim (BACTRIM DS) 800-160 MG per tablet 1 tablet, 1 tablet, Oral, Q M,W,F,  Barton Dubois, MD, 1 tablet at 06/11/19 [facility]   June 02, 2019-admit for COVID-19 June 11, 2019-transfer to the ICU for high flow nasal cannula oxygen requirement and worsening  Consults:  June 11, 2019 pulmonary  Procedures:  x  Significant Diagnostic Tests:  x  Micro Data:  x  Antimicrobials:   Anti-infectives (From admission, onward)   Start     Dose/Rate Route Frequency Ordered Stop   06/11/19 1830  remdesivir 100 mg in sodium chloride 0.9 % 250 mL IVPB     100 mg 500 mL/hr over 30 Minutes Intravenous Every 24 hours 06/11/19 1710 06/16/19 1829   06/04/19 1000  sulfamethoxazole-trimethoprim (BACTRIM DS) 800-160 MG per tablet 1 tablet     1 tablet Oral Every M-W-F 05/18/2019 1551     06/03/19 2100  remdesivir 100 mg in sodium chloride 0.9 % 250 mL IVPB  100 mg 500 mL/hr over 30 Minutes Intravenous Every 24 hours 05/19/2019 1956 06/06/19 2143   05/12/2019 2100  remdesivir 200 mg in sodium chloride 0.9 % 250 mL IVPB     200 mg 500 mL/hr over 30 Minutes Intravenous Once 05/20/2019 1956 05/07/2019 2315   06/05/2019 1430  vancomycin (VANCOCIN) IVPB 1000 mg/200 mL premix     1,000 mg 200 mL/hr over 60 Minutes Intravenous  Once 05/25/2019 1422 05/11/2019 1547   05/16/2019 1430  piperacillin-tazobactam (ZOSYN) IVPB 3.375 g     3.375 g 100 mL/hr over 30 Minutes Intravenous  Once 05/09/2019 1422 05/28/2019 1514       Interim history/subjective:  7/6 - feelig better after starting HFNC  Objective   Blood pressure 132/77, pulse 95, temperature 97.7 F (36.5 C), temperature source Axillary, resp. rate (!) 25, height 5' 7" (1.702 m), weight 114.4 kg, SpO2 (!) 85 %.        Intake/Output Summary (Last 24 hours) at 06/11/2019 1611 Last data filed at 06/11/2019 1500 Gross per 24 hour  Intake 480 ml  Output 1350 ml  Net -870 ml   Filed Weights   05/11/2019 1309 05/23/2019 1949  Weight: 120.4 kg 114.4 kg    Examination: General: Obese male.  Not in distress HENT:  Mallampati class IV.  No elevated JVP Lungs: No increased work of breathing other than mild.  Clear to auscultation but diminished in the bases but overall diminished as well Cardiovascular: Regular rate and rhythm no murmurs Abdomen: Obese soft Extremities: Mild chronic venous stasis edema Neuro: Hard of hearing but alert and oriented x3 moves all fours GU: Not examined   LABS    PULMONARY Recent Labs  Lab 06/11/19 1411 06/11/19 1419  PHART  --  7.554*  PCO2ART  --  25.7*  PO2ART  --  31.0*  HCO3 22.8 22.7  TCO2 24 23  O2SAT 57.0 71.0    CBC Recent Labs  Lab 06/09/19 0347 06/10/19 0436 06/11/19 1315 06/11/19 1411 06/11/19 1419  HGB 9.6* 9.6* 10.0* 10.5* 11.2*  HCT 31.6* 32.8* 32.0* 31.0* 33.0*  WBC 82.4* 95.5* PENDING  --   --   PLT 198 216 228  --   --     COAGULATION No results for input(s): INR in the last 168 hours.  CARDIAC  No results for input(s): TROPONINI in the last 168 hours. No results for input(s): PROBNP in the last 168 hours.   CHEMISTRY Recent Labs  Lab 06/07/19 0300 06/08/19 0808 06/09/19 0347 06/10/19 0436 06/11/19 0405 06/11/19 1411 06/11/19 1419  NA 138 137 137 138 138 134* 133*  K 5.2* 4.1 5.2* 4.4 3.7 4.4 4.4  CL 105 101 103 100 103  --   --   CO2 _0 --   --   GLUCOSE 169* 134* 150* 157* 134*  --   --   BUN 55* 58* 60* 60* 56*  --   --   CREATININE 1.50* 1.78* 1.64* 1.74* 1.78*  --   --   CALCIUM 8.9 9.1 9.3 8.9 8.9  --   --    Estimated Creatinine Clearance: 47.3 mL/min (A) (by C-G formula based on SCr of 1.78 mg/dL (H)).   LIVER Recent Labs  Lab 06/07/19 0300 06/08/19 0808 06/09/19 0347 06/10/19 0436 06/11/19 0405  AST 19 15 14* 14* 15  ALT _1 ALKPHOS 47 50 49 50 46  BILITOT 0.5 0.9 0.8 0.7 0.9  PROT  5.8* 6.7 6.3* 6.3* 6.1*  ALBUMIN 3.6 4.0 3.8 3.8 3.5     INFECTIOUS Recent Labs  Lab 06/11/19 1315  PROCALCITON 0.24     ENDOCRINE CBG (last 3)  No results for input(s):  GLUCAP in the last 72 hours.       IMAGING x48h  - image(s) personally visualized  -   highlighted in bold Dg Chest Port 1 View  Result Date: 06/11/2019 CLINICAL DATA:  Shortness of breath EXAM: PORTABLE CHEST 1 VIEW COMPARISON:  06/09/2019 FINDINGS: Bilateral lower lobe airspace disease. Possible trace left pleural effusion. No pneumothorax. Stable cardiomediastinal silhouette. No aggressive osseous lesion. IMPRESSION: Bibasilar airspace disease concerning for pneumonia. Electronically Signed   By: Kathreen Devoid   On: 06/11/2019 09:15     Resolved Hospital Problem list   x  Assessment & Plan:  Acute on chronic hypoxemic respiratory failure in setting of baseline copd/OSA/ CLL/chronic immune suppression  - decompensation D10 of symptoms and D15 since diagnosis - features are c/w cytokine storm   PLAN -Oxygen therapy -accept pulse ox greater than 85%  -Prone positioning = he says because of his extreme visceral obesity he cannot do this -so we will have him be on the side  -Start BiPAP few hours on and few hours off in the daytime and mandatory at night [he is on noninvasive ventilation at home]  -Change Combivent HFA and tube DuoNeb scheduled and Pulmicort scheduled  - Tocilizumab consideration: This is available as off label use for cytokine storm which the wife is familiar with.  Explained to wife that 3 randomized control trials with IL-6 inhibition have been negative so far with ongoing larger trials.  2 of these trials came from 2 different products and severely ill patients like him.  Explained to wife that multiple retrospective studies do show positive outcomes.  Explained also that in the presence of chronic immunosuppression he is at high risk for bacterial and fungal infections as suggested by [place].  Moreover our internal algorithm is to consider Tocilizumab for CRP greater than 7.  His is 6.6.  Ultimately she left the timing and decision to our choice   -we can consider this a salvage therapy especially in the setting of rising CRP.Marland Kitchen   -COVID-19 research trials: Spent a long time explaining to him and to her about the concept of research trial and participation.  This would be to help improve science and contribute towards finding a treatment for COVID-19 especially in hospitalized patients.  We offered 1 randomized control trial.  Explained the concept of blinding and voluntary participation. The study is [employer] monoclonal antibody against connective tissue growth factor.  The wife and he are fine with this.  I have emailed of the consent and given him a copy of the paper consent to read.  Explained obligations about keeping up with study visits, the concept of getting placebo, uncertainties and potential benefits and potential risks and unapproved medication.  He is willing to participate in an effort to contribute to signs but in the process he understands he might benefit from it or get home from it or have neutral outcomes.  He will make a decision tomorrow  Best practice:  Code status: full  Per [place]   The patient REYAAN THOMA is critically ill with multiple organ systems failure and requires high complexity decision making for assessment and support, frequent evaluation and titration of therapies, application of advanced monitoring  technologies and extensive interpretation of multiple databases.   Critical Care Time devoted to patient care services described in this note is  30  Minutes. This time reflects time of care of this signee Dr Brand Males. This critical care time does not reflect procedure time, or teaching time or supervisory time of PA/NP/Med student/Med Resident etc but could involve care discussion time     Dr. Brand Males, M.D., [facility].C.P Pulmonary and Critical Care Medicine Staff Physician [facility] Pager: [phone], If no answer  or between  15:00h - 7:00h: call [phone]  06/11/2019 5:48 PM

## 2019-06-11 NOTE — Progress Notes (Addendum)
PROGRESS NOTE                                                                                                                                                                                                             Patient Demographics:    [name] Ball, is a 68 y.o. male, DOB - October 14, 1951, VVO:[id number]  Outpatient Primary MD for the patient is [facility], Modena Nunnery, MD    LOS - 9  Chief Complaint  Patient presents with  . Shortness of Breath       Brief Narrative: Patient is a 68 y.o. male with PMHx of CLL, HTN, Hypothyroidism, hx of CVA, GERD, COPD/asthma, CKD stage 3 and severe hearing impared/deafness-presented to the hospital with SOB-found to have acute hypoxic resp failure secondary to COVID 19 PNA, he was treated with Remdesivir, IV steroids, with significant improvement of oxygen requirement, but still requiring oxygen, plan to discharge home on 7/6 on home oxygen 2 L nasal cannula, on day of discharge he had  acute decompensation, profoundly hypoxic required transfer to ICU, he is currently   Subjective:    [name] any significant events overnight, bridle, no chest pain, no nausea or vomiting .   Assessment  & Plan :   Acute Hypoxemic Resp Failure due to Covid 19 Viral pneumonia:  -Patient requiring 2 L nasal cannula on admission , started on steroids, and Remdesivir, his oxygen requirement has been fluctuating initially, but he remains on 2 L nasal cannula, plan was to discharge on 7/6 with home oxygen . -With acute decompensation today, severely hypoxemic, transferred to ICU . -He is currently on heated flow oxygen, 50 L, 100% flow . -Chest x-ray showing worsening opacity, but nothing suspicious of bacterial pneumonia, especially with normal procalcitonin level . -No evidence of volume overload, but he did receive 40 mg of IV Lasix . -Remdesivir 6/27 x 5 days . -remains on IV steroids . - no  convulsant plasma given his Jehovah's Witness. -Continue with diuresis as needed -I have discussed with the patient, other medication we can offer like Actemra as off label use, explained for her potential benefits with anecdotal evidence as it felt he might be having cytokine storm, versus side effects, given he is immunocompromised with CLL, long steroid use, and potential for infections , at this point patient's wife has declined  Actemra.  COVID-19 Labs:  Recent Labs    06/09/19 0347 06/10/19 0436 06/11/19 1315  DDIMER  --   --  0.33  CRP 1.1* 1.4* 6.6*    Lab Results  Component Value Date   SARSCOV2NAA Detected (A) 05/28/2019     COVID-19 Medications: 6/27>> Solumedrol 6/27>>Remdesivir  Hyperkalemia:  -resolved, received lokelma - Patient is on Bactrim (prophylaxis-as patient on chronic steroids for CLL).  Hypothyroidism: Continue Synthroid  CKD (chronic kidney disease), stage III ([place]): Remains at baseline-follow  COPD/Asthma: no exacerbation-continue bronchodilators.  GERD (gastroesophageal reflux disease):continue PPI  Essential hypertension:controlled-continue metoprolol-resume Norvasc over the next few days,  Depression:continue celexa,no SI or hallucinations   CLL (chronic lymphocytic leukemia):  - stable-has significant leukocytosis at baseline. Follow with Oncology on discharge.  Is maintained on 5 mg of prednisolone daily-and on prophylactic Bactrim, prednisone is on hold given he is on IV Solu-Medrol.  Autoimmune hemolytic anemia related to CLL: Hemoglobin stable-on steroids.  History of CVA (cerebrovascular accident):no new focal deficits-continue Plavix  OSA: on CPAP at home-unable to resume during this hospitalization due to hospital protocol. On O2.  Morbid obesity  Severe hearing loss/Deafness  Jehovah's Witness: Refuses all blood product    ABG:    Component Value Date/Time   PHART 7.554 (H) 06/11/2019 1419   PCO2ART 25.7 (L)  06/11/2019 1419   PO2ART 31.0 (LL) 06/11/2019 1419   HCO3 22.7 06/11/2019 1419   TCO2 23 06/11/2019 1419   O2SAT 71.0 06/11/2019 1419    Condition -Stable  Family Communication  : D/W wife via phone 7/6, she was updated of deterioration in patient's clinical  Code Status :  Full Code  Diet :  Diet Order            Diet Heart Room service appropriate? Yes; Fluid consistency: Thin  Diet effective now               Disposition Plan  :  Remain inpatient   DVT Prophylaxis  :   Heparin  Lab Results  Component Value Date   PLT 228 06/11/2019    Inpatient Medications  Scheduled Meds: . chlorhexidine  15 mL Mouth Rinse BID  . Chlorhexidine Gluconate Cloth  6 each Topical Daily  . citalopram  10 mg Oral Daily  . clopidogrel  75 mg Oral Daily  . fluticasone  2 puff Inhalation BID  . folic acid  1 mg Oral Daily  . heparin  5,000 Units Subcutaneous Q8H  . Ipratropium-Albuterol  1 puff Inhalation Q6H  . levothyroxine  50 mcg Oral Q0600  . mouth rinse  15 mL Mouth Rinse q12n4p  . methylPREDNISolone (SOLU-MEDROL) injection  40 mg Intravenous Daily  . metoprolol tartrate  12.5 mg Oral BID  . pantoprazole  40 mg Oral Daily  . sulfamethoxazole-trimethoprim  1 tablet Oral Q M,W,F   Continuous Infusions:  PRN Meds:.acetaminophen, guaiFENesin-dextromethorphan, HYDROcodone-acetaminophen, ondansetron **OR** ondansetron (ZOFRAN) IV  Antibiotics  :    Anti-infectives (From admission, onward)   Start     Dose/Rate Route Frequency Ordered Stop   06/04/19 1000  sulfamethoxazole-trimethoprim (BACTRIM DS) 800-160 MG per tablet 1 tablet     1 tablet Oral Every M-W-F 05/08/2019 1551     06/03/19 2100  remdesivir 100 mg in sodium chloride 0.9 % 250 mL IVPB     100 mg 500 mL/hr over 30 Minutes Intravenous Every 24 hours 05/25/2019 1956 06/06/19 2143   06/01/2019 2100  remdesivir 200 mg in sodium chloride 0.9 % 250 mL  IVPB     200 mg 500 mL/hr over 30 Minutes Intravenous Once 05/30/2019 1956  05/17/2019 2315   05/17/2019 1430  vancomycin (VANCOCIN) IVPB 1000 mg/200 mL premix     1,000 mg 200 mL/hr over 60 Minutes Intravenous  Once 06/04/2019 1422 05/20/2019 1547   05/07/2019 1430  piperacillin-tazobactam (ZOSYN) IVPB 3.375 g     3.375 g 100 mL/hr over 30 Minutes Intravenous  Once 05/13/2019 1422 05/17/2019 1514         Objective:   Vitals:   06/11/19 1300 06/11/19 1304 06/11/19 1455 06/11/19 1500  BP:    132/77  Pulse:   94 95  Resp:   (!) 24 (!) 25  Temp:    97.7 F (36.5 C)  TempSrc:    Axillary  SpO2: (!) 73% 93% 100% (!) 85%  Weight:      Height:        Wt Readings from Last 3 Encounters:  05/27/2019 114.4 kg  05/16/19 120.4 kg  04/12/19 120.7 kg     Intake/Output Summary (Last 24 hours) at 06/11/2019 1527 Last data filed at 06/11/2019 1500 Gross per 24 hour  Intake 480 ml  Output 1350 ml  Net -870 ml     Physical Exam  Awake Alert, Oriented X 3, extremely hard of hearing, no new F.N deficits, Normal affect Symmetrical Chest wall movement, patient is mildly tachypneic, mild increased work of breathing RRR,No Gallops,Rubs or new Murmurs, No Parasternal Heave +ve B.Sounds, Abd Soft, No tenderness, No rebound - guarding or rigidity. No Cyanosis, Clubbing or edema, No new Rash or bruise       Data Review:    CBC Recent Labs  Lab 06/05/19 0322 06/06/19 0205 06/07/19 0300 06/08/19 0808 06/09/19 0347 06/10/19 0436 06/11/19 1315 06/11/19 1411 06/11/19 1419  WBC 81.3* 64.7* 60.1* 84.7* 82.4* 95.5* PENDING  --   --   HGB 9.0* 8.9* 8.8* 9.6* 9.6* 9.6* 10.0* 10.5* 11.2*  HCT 29.9* 29.3* 29.6* 32.2* 31.6* 32.8* 32.0* 31.0* 33.0*  PLT 127* 130* 143* 184 198 216 228  --   --   MCV 99.3 98.7 98.0 97.3 97.8 99.1 96.7  --   --   MCH 29.9 30.0 29.1 29.0 29.7 29.0 30.2  --   --   MCHC 30.1 30.4 29.7* 29.8* 30.4 29.3* 31.3  --   --   RDW 14.5 14.4 14.6 14.5 14.6 14.7 14.7  --   --   LYMPHSABS 67.5* 52.8* 46.0*  --   --   --   --   --   --   MONOABS 8.8* 8.0* 10.4*   --   --   --   --   --   --   EOSABS 0.0 0.0 0.0  --   --   --   --   --   --   BASOSABS 0.3* 0.2* 0.1  --   --   --   --   --   --     Chemistries  Recent Labs  Lab 06/07/19 0300 06/08/19 0808 06/09/19 0347 06/10/19 0436 06/11/19 0405 06/11/19 1411 06/11/19 1419  NA 138 137 137 138 138 134* 133*  K 5.2* 4.1 5.2* 4.4 3.7 4.4 4.4  CL 105 101 103 100 103  --   --   CO2 24 25 23 25 26   --   --   GLUCOSE 169* 134* 150* 157* 134*  --   --   BUN 55* 58* 60* 60* 56*  --   --  CREATININE 1.50* 1.78* 1.64* 1.74* 1.78*  --   --   CALCIUM 8.9 9.1 9.3 8.9 8.9  --   --   AST 19 15 14* 14* 15  --   --   ALT 28 26 24 24 19   --   --   ALKPHOS 47 50 49 50 46  --   --   BILITOT 0.5 0.9 0.8 0.7 0.9  --   --    ------------------------------------------------------------------------------------------------------------------ No results for input(s): CHOL, HDL, LDLCALC, TRIG, CHOLHDL, LDLDIRECT in the last 72 hours.  Lab Results  Component Value Date   HGBA1C 5.2 01/12/2019   ------------------------------------------------------------------------------------------------------------------ No results for input(s): TSH, T4TOTAL, T3FREE, THYROIDAB in the last 72 hours.  Invalid input(s): FREET3 ------------------------------------------------------------------------------------------------------------------ No results for input(s): VITAMINB12, FOLATE, FERRITIN, TIBC, IRON, RETICCTPCT in the last 72 hours.  Coagulation profile No results for input(s): INR, PROTIME in the last 168 hours.  Recent Labs    06/11/19 1315  DDIMER 0.33    Cardiac Enzymes No results for input(s): CKMB, TROPONINI, MYOGLOBIN in the last 168 hours.  Invalid input(s): CK ------------------------------------------------------------------------------------------------------------------    Component Value Date/Time   BNP 24.4 06/08/2019 0415    Micro Results No results found for this or any previous visit (from  the past 240 hour(s)).  Radiology Reports Dg Chest Port 1 View  Result Date: 06/11/2019 CLINICAL DATA:  Shortness of breath EXAM: PORTABLE CHEST 1 VIEW COMPARISON:  06/09/2019 FINDINGS: Bilateral lower lobe airspace disease. Possible trace left pleural effusion. No pneumothorax. Stable cardiomediastinal silhouette. No aggressive osseous lesion. IMPRESSION: Bibasilar airspace disease concerning for pneumonia. Electronically Signed   By: Kathreen Devoid   On: 06/11/2019 09:15   Dg Chest Port 1 View  Result Date: 06/09/2019 CLINICAL DATA:  Acute hypoxic respiratory failure. EXAM: PORTABLE CHEST 1 VIEW COMPARISON:  Radiograph June 03, 2019. FINDINGS: Stable cardiomegaly. No pneumothorax is noted. Stable bibasilar opacities are noted concerning for infiltrates or multifocal pneumonia. Small left pleural effusion cannot be excluded. Bony thorax is unremarkable. IMPRESSION: Stable bibasilar opacities are noted concerning for multifocal pneumonia. Possible small left pleural effusion may be present. Electronically Signed   By: Marijo Conception M.D.   On: 06/09/2019 11:43   Dg Chest Port 1 View  Result Date: 06/03/2019 CLINICAL DATA:  Shortness of breath EXAM: PORTABLE CHEST 1 VIEW COMPARISON:  Chest x-rays dated 06/04/2019 and 04-14-2016. FINDINGS: Persistent opacity at the LEFT lung base. Additional opacity now noted within the periphery of the LEFT mid lung. Chronic opacities noted at the RIGHT lung base. No pneumothorax seen. Stable cardiomegaly. IMPRESSION: 1. Persistent opacity at the LEFT lung base. Additional opacity now noted within the periphery of the LEFT mid lung. Findings are suspicious for multifocal pneumonia. Consider chest CT for more definitive characterization. 2. Cardiomegaly. Electronically Signed   By: Franki Cabot M.D.   On: 06/03/2019 08:02   Dg Chest Port 1 View  Result Date: 05/13/2019 CLINICAL DATA:  Shortness of breath and cough EXAM: PORTABLE CHEST 1 VIEW COMPARISON:  June 16, 2016.  FINDINGS: There is an area of ill-defined opacity adjacent to the left heart border which is suspicious for an area of pneumonia. Lungs elsewhere clear. Heart is upper normal in size with pulmonary vascularity normal. No adenopathy. No bone lesions. IMPRESSION: Ill-defined opacity adjacent to the left heart border suspicious for pneumonia. Lungs elsewhere clear. Heart upper normal in size. Followup PA and lateral chest radiographs recommended in 3-4 weeks following trial of antibiotic therapy to ensure resolution  and exclude underlying malignancy. Electronically Signed   By: Lowella Grip III M.D.   On: 05/21/2019 14:05    Time Spent in minutes  35   Phillips Climes M.D on 06/06/2019 at 1:35 PM  To page go to [email or web address] - use universal password  [facility] -  Office  [phone]   See all Orders from today for further details   Admit date - 05/26/2019

## 2019-06-11 NOTE — Progress Notes (Signed)
Occupational Therapy Treatment Patient Details Name: [name] Ball MRN: [id number] DOB: [birth year] Today's Date: 06/11/2019    History of present illness Patient is a 68 y.o. male with PMHx of CLL, HTN, Hypothyroidism, hx of CVA(left thalamic infarct), GERD, COPD/asthma, CKD stage 3 and severe hearing impared/deafness-presented to the hospital with SOB-found to have acute hypoxic resp failure secondary to COVID 19 PNA.    OT comments  Pt presenting this session with increased fatigue and decreased SpO2 on 8L O2. Pt performing short distance mobility to recliner for breakfast with Min Guard A; noting SOB and fatigue. Spo2 dropping to 70s on 8L and cued for purse lip breathing. Attempting to wean to 4L and then 6L, but SpO2 reading in 70s-80s (flucuating with poor wave form and good wave form with pulse ox on forehead). At rest, pt able to purse lip breath and maintain SpO2 >87% on 8L via HFNC. Pt disappointed that he will not dc today but understands why. Will continue to follow acutely as admitted and continue to recommend dc to home with [place].    Follow Up Recommendations  Home health OT;Supervision/Assistance - 24 hour(initially)    Equipment Recommendations  3 in 1 bedside commode(WIDE)    Recommendations for Other Services      Precautions / Restrictions Precautions Precautions: Fall Precaution Comments: Watch stats Restrictions Weight Bearing Restrictions: No       Mobility Bed Mobility Overal bed mobility: Modified Independent             General bed mobility comments: Increased time  Transfers Overall transfer level: Needs assistance Equipment used: None Transfers: Sit to/from Stand Sit to Stand: Min guard         General transfer comment: Min Guard A for safety    Balance Overall balance assessment: Needs assistance Sitting-balance support: No upper extremity supported;Feet supported Sitting balance-Leahy Scale: Good     Standing balance support:  Bilateral upper extremity supported;During functional activity Standing balance-Leahy Scale: Fair                             ADL either performed or assessed with clinical judgement   ADL Overall ADL's : Needs assistance/impaired Eating/Feeding: Independent                       Toilet Transfer: Min guard;Ambulation(simulated to recliner)           Functional mobility during ADLs: Min guard General ADL Comments: SpO2 dropping to 70s on 8L O2. Noting pt SOB and fatigues. MD entering at this point. Focused remainder of session testing Spo2 level with changes in O2. Leaving pt on 8L on HFNC at end of session with him stating in high 80s.      Vision   Vision Assessment?: No apparent visual deficits   Perception     Praxis      Cognition Arousal/Alertness: Awake/alert Behavior During Therapy: WFL for tasks assessed/performed Overall Cognitive Status: Within Functional Limits for tasks assessed                                 General Comments: Pt is a jokester, likes to laugh. This session pt disappointed since he will not be leaving today.         Exercises     Shoulder Instructions       General Comments  Pertinent Vitals/ Pain       Pain Assessment: Faces Faces Pain Scale: Hurts a little bit Pain Location: R knee Pain Descriptors / Indicators: Constant;Discomfort Pain Intervention(s): Monitored during session;Limited activity within patient's tolerance;Repositioned  Home Living                                          Prior Functioning/Environment              Frequency  Min 3X/week        Progress Toward Goals  OT Goals(current goals can now be found in the care plan section)  Progress towards OT goals: Progressing toward goals  Acute Rehab OT Goals Patient Stated Goal: To get well and home to grandchildren OT Goal Formulation: With patient Time For Goal Achievement:  06/18/19 Potential to Achieve Goals: Good ADL Goals Pt Will Perform Grooming: with supervision;standing Pt Will Perform Upper Body Dressing: with modified independence;sitting Pt Will Perform Lower Body Dressing: with min guard assist;sit to/from stand Pt Will Transfer to Toilet: with supervision;ambulating Pt Will Perform Toileting - Clothing Manipulation and hygiene: with modified independence;sit to/from stand Pt/caregiver will Perform Home Exercise Program: Both right and left upper extremity;With theraband;Independently;With written HEP provided Additional ADL Goal #1: Pt will recall and implement 3 energy conservation techniques during ADL routine at independent level  Plan Discharge plan needs to be updated;Frequency needs to be updated    Co-evaluation                 AM-PAC OT "6 Clicks" Daily Activity     Outcome Measure   Help from another person eating meals?: None Help from another person taking care of personal grooming?: A Little Help from another person toileting, which includes using toliet, bedpan, or urinal?: A Little Help from another person bathing (including washing, rinsing, drying)?: A Little Help from another person to put on and taking off regular upper body clothing?: A Little Help from another person to put on and taking off regular lower body clothing?: A Lot 6 Click Score: 18    End of Session Equipment Utilized During Treatment: Gait belt;Oxygen;Rolling walker(4-8L O2)  OT Visit Diagnosis: Unsteadiness on feet (R26.81);Muscle weakness (generalized) (M62.81);Pain Pain - Right/Left: Right Pain - part of body: Knee   Activity Tolerance Patient tolerated treatment well   Patient Left in chair;with call bell/phone within reach;with chair alarm set   Nurse Communication Mobility status        Time: 3825-0539 OT Time Calculation (min): 34 min  Charges: OT General Charges $OT Visit: 1 Visit OT Treatments $Self Care/Home Management : 8-22  mins $Therapeutic Activity: 8-22 mins  Claudean Leavelle MSOT, OTR/L Acute Rehab Pager: [phone] Office: [place] 06/11/2019, 11:18 AM

## 2019-06-11 NOTE — Progress Notes (Signed)
Pt transferred to ICU. Bedside report given. Pt stable during transport. Called and spoke with wife, she has been updated by MD too. She has no further questions at this time.

## 2019-06-11 NOTE — Progress Notes (Signed)
**Note De-Identified Gerene Nedd Obfuscation** ABG complete; critical results reported to Dr. Trellis Moment

## 2019-06-11 NOTE — Progress Notes (Signed)
  PT Cancellation Note  Patient Details Name: [name] Ball MRN: [id number] DOB: [birth year]   Cancelled Treatment:    Reason Eval/Treat Not Completed: Medical issues which prohibited therapy, worsening SOB, to ICU.   [name] 06/11/2019, 3:18 PM

## 2019-06-11 NOTE — Progress Notes (Signed)
Patient arrived to unit at approximately 1500 in respiratory distress. Transitioned to heated high flow at 100%/50L. Patient currently 93%sp02 and sleeping. Right lung sounds with insp/exp wheezing, left is diminished. Patient complaining of LUQ abdominal tenderness. MD team at the bedside to assess the patient and update family.

## 2019-06-11 NOTE — Progress Notes (Signed)
Pt remains on 6L [place] with o2 80's-90's pt increased to 15L high flow. Pt reports not feeling well. Pt is pale and has headache.  MD made aware. ABG and blood work ordered.

## 2019-06-11 NOTE — Progress Notes (Signed)
Patient confirmed wife is next of kin. MD updated family on transfer to ICU and plan of care. Patient has cell phone at the bedside and is in communication with family.

## 2019-06-12 DIAGNOSIS — R825 Elevated urine levels of drugs, medicaments and biological substances: Secondary | ICD-10-CM

## 2019-06-12 LAB — COMPREHENSIVE METABOLIC PANEL
ALT: 18 U/L (ref 0–44)
AST: 13 U/L — ABNORMAL LOW (ref 15–41)
Albumin: 3.5 g/dL (ref 3.5–5.0)
Alkaline Phosphatase: 44 U/L (ref 38–126)
Anion gap: 11 (ref 5–15)
BUN: 49 mg/dL — ABNORMAL HIGH (ref 8–23)
CO2: 24 mmol/L (ref 22–32)
Calcium: 9.1 mg/dL (ref 8.9–10.3)
Chloride: 104 mmol/L (ref 98–111)
Creatinine, Ser: 1.77 mg/dL — ABNORMAL HIGH (ref 0.61–1.24)
GFR calc Af Amer: 44 mL/min — ABNORMAL LOW (ref >60)
GFR calc non Af Amer: 38 mL/min — ABNORMAL LOW (ref >60)
Glucose, Bld: 143 mg/dL — ABNORMAL HIGH (ref 70–99)
Potassium: 4.1 mmol/L (ref 3.5–5.1)
Sodium: 139 mmol/L (ref 135–145)
Total Bilirubin: 0.6 mg/dL (ref 0.3–1.2)
Total Protein: 6.3 g/dL — ABNORMAL LOW (ref 6.5–8.1)

## 2019-06-12 LAB — CBC
HCT: 32.7 % — ABNORMAL LOW (ref 39.0–52.0)
Hemoglobin: 9.8 g/dL — ABNORMAL LOW (ref 13.0–17.0)
MCH: 29.3 pg (ref 26.0–34.0)
MCHC: 30 g/dL (ref 30.0–36.0)
MCV: 97.9 fL (ref 80.0–100.0)
Platelets: 242 10*3/uL (ref 150–400)
RBC: 3.34 MIL/uL — ABNORMAL LOW (ref 4.22–5.81)
RDW: 14.7 % (ref 11.5–15.5)
WBC: 83.7 10*3/uL (ref 4.0–10.5)
nRBC: 0 % (ref 0.0–0.2)

## 2019-06-12 LAB — STREP PNEUMONIAE URINARY ANTIGEN: Strep Pneumo Urinary Antigen: NEGATIVE

## 2019-06-12 LAB — TROPONIN I (HIGH SENSITIVITY): Troponin I (High Sensitivity): 5 ng/L (ref <18)

## 2019-06-12 LAB — D-DIMER, QUANTITATIVE: D-Dimer, Quant: <0.27 ug/mL-FEU (ref 0.00–0.50)

## 2019-06-12 LAB — FERRITIN: Ferritin: 374 ng/mL — ABNORMAL HIGH (ref 24–336)

## 2019-06-12 LAB — MRSA PCR SCREENING: MRSA by PCR: NEGATIVE

## 2019-06-12 LAB — BRAIN NATRIURETIC PEPTIDE: B Natriuretic Peptide: 26.6 pg/mL (ref 0.0–100.0)

## 2019-06-12 LAB — C-REACTIVE PROTEIN: CRP: 9.5 mg/dL — ABNORMAL HIGH (ref <1.0)

## 2019-06-12 LAB — PROCALCITONIN: Procalcitonin: 0.17 ng/mL

## 2019-06-12 MED ORDER — TOCILIZUMAB 400 MG/20ML IV SOLN
800.0000 mg | Freq: Once | INTRAVENOUS | Status: AC
  Administered 2019-06-12: 800 mg via INTRAVENOUS
  Filled 2019-06-12: qty 40

## 2019-06-12 MED ORDER — METHYLPREDNISOLONE SODIUM SUCC 40 MG IJ SOLR
40.0000 mg | Freq: Two times a day (BID) | INTRAMUSCULAR | Status: DC
  Administered 2019-06-12 – 2019-06-16 (×8): 40 mg via INTRAVENOUS
  Filled 2019-06-12 (×8): qty 1

## 2019-06-12 MED ORDER — DIPHENHYDRAMINE HCL 25 MG PO CAPS: 25.0000 mg | ORAL_CAPSULE | Freq: Four times a day (QID) | ORAL | Status: DC | PRN

## 2019-06-12 MED ORDER — STUDY - FIBROGEN - PAMREVLUMAB OR PLACEBO 10 MG/ML IV INFUSION (PI-RAMASWAMY)
35.0000 mg/kg | Freq: Once | INTRAVENOUS | Status: AC
  Administered 2019-06-12 (×2): 4000 mg via INTRAVENOUS
  Filled 2019-06-12: qty 400

## 2019-06-12 MED ORDER — DIPHENHYDRAMINE HCL 50 MG/ML IJ SOLN
25.0000 mg | Freq: Four times a day (QID) | INTRAMUSCULAR | Status: DC | PRN
  Administered 2019-06-12: 25 mg via INTRAVENOUS

## 2019-06-12 MED ORDER — DIPHENHYDRAMINE HCL 50 MG/ML IJ SOLN
INTRAMUSCULAR | Status: AC
  Filled 2019-06-12: qty 1

## 2019-06-12 MED ORDER — DIPHENHYDRAMINE HCL 50 MG/ML IJ SOLN: 25.0000 mg | Freq: Once | INTRAMUSCULAR | Status: DC | PRN

## 2019-06-12 MED ORDER — IPRATROPIUM-ALBUTEROL 0.5-2.5 (3) MG/3ML IN SOLN: 3.0000 mL | Freq: Four times a day (QID) | RESPIRATORY_TRACT | Status: DC | PRN

## 2019-06-12 NOTE — Research (Signed)
Title: [id number] (FibroGen Study) Randomized, Double-Blind, Placebo-Controlled Phase 2 Study of the Efficacy and Safety of Intravenous Pamrevlumab, a Monoclonal Antibody Against Connective Tissue Growth Factor (CTGF), in Hospitalized Patients with Acute COVID-19 Disease .  alized Patients with Acute COVID-19 Disease  Protocol #: FGCL-3019-098, Clinical Trials #: NCT [id number] Sponsor: [email or web address]  ([place], [place], [place])    PRE_CONSENT done yesterday 06/11/2019 with patient - in presence of RN and talking loudly to patient who is HOH a. Explained the follow trial below and concepts of trial. He was wiling to be a voluntary participatnt. Spoke to his wife over phoe. With [employer] - she not against MAb. She was ok with it. ICF was e-\ mailed to her. PAtient given copy yesterday. This morning - was hard to communicate with him despite stratus sign language interpreter because of BIPAP but he did ask to nurse if he needed to sign research paper. D/w Wife and she said she had read the consent and was ready for his participation to help science. Wanted to ensure benadryl was given as pre-med     SIGNATURE    Dr. Brand Males, M.D., F.C.C.P, ACRP-CPI Pulmonary and [place] Investigator, [facility] [place] Staff Physician, [place] Director - Interstitial Lung Disease  Program  [facility] [place], [place], [address]  Pager: [phone], If no answer or between  15:00h - 7:00h: call [phone] Telephone: [phone]  10:57 AM 06/12/2019  ................................................................................................................................................  Title: [id number] (FibroGen Study) Randomized, Double-Blind, Placebo-Controlled Phase 2 Study of the Efficacy and Safety of Intravenous Pamrevlumab, a Monoclonal  Antibody Against Connective Tissue Growth Factor (CTGF), in Hospitalized Patients with Acute COVID-19 Disease .  alized Patients with Acute COVID-19 Disease  Protocol #: FGCL-3019-098, Clinical Trials #: NCT [id number] Sponsor: [email or web address]  ([place], [place], [place])  Nature conservation officer Features of Pamrevlumab (FG-3019) the study drug: a recombinant fully human IgG kappa monoclonal antibody that binds to CTGF and is being developed for treatment of diseases in which tissue fibrosis has a major pathogenic role. In particular, Inhibition of CTGF with pamrevlumab attenuates vascular leakage in multiple animal disease models.Therefore, hypothesis for its role in COVID-19 hospitalized patients.  Half-life: 58-141 hours; t1/2 increases with increasing doses  Key Inclusion Criteria:  . Age 88-85 years . Confirmed SARS-CoV-2 infection by a FDA-authorized diagnostic test (e.g., polymerase chain reaction [PCR] or other approved assay from any specimen source; note: a positive serology/antibody test for SARS-CoV-2 does NOT qualify as evidence of acute COVID-19 disease) . Respiratory compromise requiring hospitalization for COVID-19 disease as evidenced by at least one (or more) of the following criteria: . Interstitial pneumonia on CXR or HRCT (findings of consolidation or ground glass opacities), OR . Peripheral capillary oxygen saturation (SpO2) < 94% on room air, OR . Requiring non-invasive supplemental oxygen (e.g., nasal cannula, face mask) to maintain SpO2  . Not requiring mechanical ventilation and/or extracorporeal membrane oxygenation (ECMO) use at time of randomization . Subject (or legally authorized representative) able to understand and sign a written informed consent form   Key Exclusion Criteria  . Male subjects who are pregnant or nursing . Participation in a clinical trial with another investigational drug for COVID-19 disease (eg: [facility] Expanded Access Program Plasma protocol) . Anticipated  discharge from the hospital or transfer to another hospital or long-term care facility which is not a study site within 72 hours  of randomization . History of allergic or anaphylactic reaction to human, humanized, chimeric or murine monoclonal antibodies   Key Protocol Features . Screening: Up to 2 days . Treatment: Up to 28 Days (4 infusions) - incl. as outpatient if discharged from hospital < 28 days, and patient agrees to return . For patients unwilling/unable to return: remote assessment of safety and VS  . Post-Treatment Follow-up: 4 weeks after last dose      Interactions No known drug interactions.     Safety Data - Psychologist, forensic (based on Edition 17.0, dated 09/29/2017)  624 subjects have been exposed to pamrevlumab, 270 with IPF The most common TEAEs in all subjects with IPF: Cough, fatigue, dyspnea, upper respiratory tract infection, bronchitis, nasopharyngitis No known effect on qtc prolongation, renal or hepatic issues   Phase 1 study Study [id number], n=21 Enrolled 21 subjects with IPF No dose-limiting toxicities All adverse events were considered mild to moderate 76% of subjects experienced at least 1 TEAE The most common TEAEs: Pyrexia (n=3, 14% of subjects) Cough (n=3, 14% of subjects) Dyspnea (n=3, 14% of subjects) Respiratory tract infection (n=2, 9% of subjects)   Phase 2 study Study [id number], n=90 Enrolled 90 subjects with IPF 14 deaths occurred, 13 were deemed to be related to IPF 20% of subjects experienced a TEAE that led to study drug discontinuation IPF and respiratory failure were the two most common reasons for discontinuation; occurring in 8% and 3% of patients, respectively The most common TEAEs: Cough (n=34, 38% of subjects) Dyspnea (n=24, 27% of subjects) Fatigue (n=24, 27% of subjects) Nasopharyngitis (n=20, 22% of subjects) Respiratory tract infection (n=19, 21% of subjects) Bronchitis (n=18, 20% of subjects)   Phase 2  study Study [id number], n=103 103 subjects enrolled with IPF 9 deaths occurred; 4 deemed related to IPF, 5 related to other respiratory causes 18% of subjects experienced a TEAE that led to study drug discontinuation The most common TEAEs: Cough (n=48, 47% of subjects) Respiratory tract infection (n=39, 38% of subjects) IPF (n=32, 31% of subjects) Dysnpea (n=30, 29% of subjects) Sinusitis (n=21, 21% of subjects) Fatigue (n=20, 19% of subjects)   Overall, pamrevlumab has been well tolerated. Infusion-related reactions have been reported at a rate that is consistent with other human monoclonal antibodies. Based on the mechanism of action of pamrevlumab, by inhibiting CTGF, there was some concern that this would cause impaired wound healing or impaired bone fracture healing. However, there were no serious adverse events reported in any study relating to these two issues.    .........................   1. Scientific Purpose  Clinical research is designed to produce generalizable knowledge and to answer questions about the safety and efficacy of intervention(s) under study in order to determine whether or not they may be useful for the care of future patients.  2. Study Procedures  Participation in a trial may involve procedures or tests, in addition to the intervention(s) under study, that are intended only or primarily to generate scientific knowledge and that are otherwise not necessary for patient care.   3. Uncertainty  For intervention(s) under study in clinical research, there often is less knowledge and more uncertainty about the risks and benefits to a population of trial participants than there is when a doctor offers a patient standard interventions.   4. Adherence to Protocol  Administration of the intervention(s) under study is typically based on a strict protocol with defined dose, scheduling, and use or avoidance of concurrent medications, compared to administration of  standard interventions.  5.  Clinician as Investigator  Clinicians who are in health care settings provide treatment; in a clinical trial setting, they are also investigating safety and efficacy of an intervention. In otherwise your doctor or nurse practitioner can be wearing 2 hats - one as care giver another as Company secretary  6. Patient as Visual merchandiser Subject  Patients participating in research trials are research subjects or volunteers. In other words participating in research is 100% voluntary and at one's own free weill. The decision to participate or not participate will NOT affect patient care and the doctor-patient relationship in any way

## 2019-06-12 NOTE — Research (Signed)
Title: [id number] (FibroGen Study) Randomized, Double-Blind, Placebo-Controlled Phase 2 Study of the Efficacy and Safety of Intravenous Pamrevlumab, a Monoclonal Antibody Against Connective Tissue Growth Factor (CTGF), in Hospitalized Patients with Acute COVID-19 Disease .  alized Patients with Acute COVID-19 Disease  Protocol #: FGCL-3019-098, Clinical Trials #: NCT [id number] Sponsor: [email or web address]  ([place], [place], [place])  Scientist, physiological / Electrical engineer note : This visit for Subject [name] Ball with DOB: 09/10/1951 on 06/12/2019 for the above protocol is Visit/Encounter # Screening and Day 1 and is for purpose of research .Subject confirmed that there was  no change in contact information (e.g. address, telephone, email). Subject thanked for participation in research and contribution to science.   In this visit 06/12/2019 the subject will be evaluated by investigator named Dr. Chase Caller  . This research coordinator has verified that the investigator is  uptodate with his/her training logs.  Because this visit is a key visit of screening/randomization this visit is under direct supervision of the PI Dr. Chase Caller . This PI is available for this visit.   Refer to PI consent note.   I spoke with the subjects wife at [phone] today and discussed the study again with her. She was emailed a consent form by PI Dr. Chase Caller on 6/Jul/2020 and she states she has read the form and her and the patient would like to proceed to consenting. Due to the subjects current condition an LAR consent was utilized. The consent form was electronically signed by the subjects wife, [name] Ball, at 12:01. Refer to the consent form documentation checklist for further documentation of the consenting process.    Signed by  [place] Bing, BS, CMA, [facility]  [place], [place] 2:36 PM 06/12/2019

## 2019-06-12 NOTE — Progress Notes (Signed)
Pt taken off bipap at this time and placed on 55L/100% with no increased WOB, no distress, VS within normal limits. RT will continue to monitor

## 2019-06-12 NOTE — Progress Notes (Addendum)
PROGRESS NOTE                                                                                                                                                                                                             Patient Demographics:    [name] Ball, is a 68 y.o. male, DOB - 06-20-1951, OQH:[id number]  Outpatient Primary MD for the patient is [place], Modena Nunnery, MD    LOS - 53  Chief Complaint  Patient presents with   Shortness of Breath       Brief Narrative: Patient is a 68 y.o. male with PMHx of CLL, HTN, Hypothyroidism, hx of CVA, GERD, COPD/asthma, CKD stage 3 and severe hearing impared/deafness-presented to the hospital with SOB-found to have acute hypoxic resp failure secondary to COVID 19 PNA.  Patient was treated with Remdesivir and IV steroids with improvement-unfortunately on 7/6-patient's hypoxemia worsened-requiring 15 L high flow oxygen-and was subsequently transferred to the ICU.   Subjective:    [name] Ball currently is unable to come off the BiPAP-he is relatively stable and not tachypneic while on BiPAP.  Per nursing staff-when off the BiPAP-even on 100% FiO2-he desaturates to the 80s.  Patient was seen with iPad translator/sign language-although he seemed to understand that he is seriously ill-not sure if he was able to understand rationale for use of Actemra-hence Dr. Chase Caller and this MD spoke with the wife over the phone.   Assessment  & Plan :   Acute Hypoxic Resp Failure with ARDS due to Covid 19 Viral pneumonia: Unfortunately-even with appropriate treatment with Remdesivir and steroids-patient decompensated on 7/6-with rapidly worsening oxygen requirements requiring transfer to the ICU.  He subsequently required BiPAP support overnight-this morning unable to be liberated off the BiPAP due to profound hypoxemia.  His CRP is now trending up-with worsening hypoxemia-his overall clinical  features is very worrisome for cytokine storm associated COVID-19. Extensive discussion with patient's spouse over the phone by this MD and by Dr. Chase Caller.  We explained the tenuous clinical situation and rationale for consideration of the off label use of Actemra-risks, benefits, alternatives were all discussed.  Extensive discussion-patient and spouse consented to the use of Actemra.  Dr. Chase Caller also obtain consent for fibrogen MAb study as well.   Patient will be continued on Remdesivir as outlined by Dr. Waldron [facility] today-I will change his steroids  to twice daily dosing.     COVID-19 Labs:  Recent Labs    06/10/19 0436 06/11/19 1315 06/12/19 0523  DDIMER  --  0.33 <0.27  FERRITIN  --   --  374*  CRP 1.4* 6.6* 9.5*    Lab Results  Component Value Date   SARSCOV2NAA Detected (A) 05/28/2019     COVID-19 Medications: 7/7>> Actemra 7/7>> Fibrogen trial - [id number] - CTGF MAb v Placebo - Blinded (planned) (Consented by Dr Chase Caller) 6/27>> Solumedrol 6/27>>Remdesivir (extended to 10-day course due to clinical worsening)  Vent Mode: BIPAP FiO2 (%):  [60 %-100 %] 60 % Set Rate:  [8 bmp-15 bmp] 15 bmp PEEP:  [6 cmH20] 6 cmH20   The treatment plan and the off label use of Actemra-its known side effects were discussed with patient & spouse, they were clearly explained that there is no definitive treatment for COVID-19 infection (apart from Remdesivir).Use of Actemra is off label and based on published clinical articles/anecdotal data.  Complete risks and long-term side effects are unknown, however in the best clinical judgment they seem to have of some clinical benefit which at this time outweighs medical risks given tenuous clinical state of the patient.  Patient/spouse agree with the treatment plan and consent to the use of Actemra  Hyperkalemia: Resolved-required several doses of Lokelma.  Continue to monitor closely as patient is on Bactrim chronically for prophylaxis-as patient is  on steroids for CLL.    Hypothyroidism: Continue Synthroid  CKD (chronic kidney disease), stage III ([place]): Remains at baseline-follow  COPD/Asthma: no exacerbation-continue bronchodilators.  GERD (gastroesophageal reflux disease):continue PPI  Essential hypertension:controlled-continue metoprolol-resume Norvasc over the next few days,  Depression:continue celexa,no SI or hallucinations   CLL (chronic lymphocytic leukemia): stable-has significant leukocytosis at baseline. Follow with Oncology on discharge.  Is maintained on 5 mg of prednisolone daily-and on prophylactic Bactrim  Autoimmune hemolytic anemia related to CLL: Hemoglobin stable-on steroids.  History of CVA (cerebrovascular accident):no new focal deficits-continue Plavix  OSA: Currently on BiPAP  Morbid obesity  Severe hearing loss/Deafness  Jehovah's Witness: Refuses all blood products including convalescent plasma  Palliative care: Spoke at length with patient's spouse over the phone-explained patient's continued deteriorating trajectory since yesterday.  Currently BiPAP dependent.  Explained that if patient continued to deteriorate at this rate-he will likely not survive this hospitalization even if he undergoes intubation and mechanical ventilation.  Both Dr. Chase Caller and I agree that any further escalation of care at this point is likely not going to be beneficial to this patient.  Spouse also understands-and is agreeable for a DNR order.   ABG:    Component Value Date/Time   PHART 7.554 (H) 06/11/2019 1419   PCO2ART 25.7 (L) 06/11/2019 1419   PO2ART 31.0 (LL) 06/11/2019 1419   HCO3 22.7 06/11/2019 1419   TCO2 23 06/11/2019 1419   O2SAT 71.0 06/11/2019 1419    Condition -critically ill-and remains very tenuous  Family Communication  : Spouse over the phone by both Dr. Chase Caller and myself  Code Status :  DNR  Diet :  Diet Order            Diet Heart Room service appropriate? Yes; Fluid  consistency: Thin  Diet effective now               Disposition Plan  :  Remain inpatient  DVT Prophylaxis  :   Heparin  Lab Results  Component Value Date   PLT 242 06/12/2019    Inpatient Medications  Scheduled Meds:  budesonide (PULMICORT) nebulizer solution  0.25 mg Nebulization BID   chlorhexidine  15 mL Mouth Rinse BID   Chlorhexidine Gluconate Cloth  6 each Topical Daily   citalopram  10 mg Oral Daily   clopidogrel  75 mg Oral Daily   folic acid  1 mg Oral Daily   heparin  5,000 Units Subcutaneous Q8H   levothyroxine  50 mcg Oral Q0600   mouth rinse  15 mL Mouth Rinse q12n4p   methylPREDNISolone (SOLU-MEDROL) injection  40 mg Intravenous Q12H   metoprolol tartrate  12.5 mg Oral BID   pantoprazole  40 mg Oral Daily   sulfamethoxazole-trimethoprim  1 tablet Oral Q M,W,F   Continuous Infusions:  remdesivir 100 mg in NS 250 mL Stopped (06/11/19 2052)   tocilizumab (ACTEMRA) IV     PRN Meds:.acetaminophen, diphenhydrAMINE, diphenhydrAMINE, guaiFENesin-dextromethorphan, HYDROcodone-acetaminophen, ipratropium-albuterol, ondansetron **OR** ondansetron (ZOFRAN) IV  Antibiotics  :    Anti-infectives (From admission, onward)   Start     Dose/Rate Route Frequency Ordered Stop   06/11/19 1830  remdesivir 100 mg in sodium chloride 0.9 % 250 mL IVPB     100 mg 500 mL/hr over 30 Minutes Intravenous Every 24 hours 06/11/19 1710 06/16/19 1829   06/04/19 1000  sulfamethoxazole-trimethoprim (BACTRIM DS) 800-160 MG per tablet 1 tablet     1 tablet Oral Every M-W-F 05/30/2019 1551     06/03/19 2100  remdesivir 100 mg in sodium chloride 0.9 % 250 mL IVPB     100 mg 500 mL/hr over 30 Minutes Intravenous Every 24 hours 06/01/2019 1956 06/06/19 2143   05/07/2019 2100  remdesivir 200 mg in sodium chloride 0.9 % 250 mL IVPB     200 mg 500 mL/hr over 30 Minutes Intravenous Once 05/24/2019 1956 06/01/2019 2315   05/14/2019 1430  vancomycin (VANCOCIN) IVPB 1000 mg/200 mL premix      1,000 mg 200 mL/hr over 60 Minutes Intravenous  Once 06/05/2019 1422 05/27/2019 1547   05/07/2019 1430  piperacillin-tazobactam (ZOSYN) IVPB 3.375 g     3.375 g 100 mL/hr over 30 Minutes Intravenous  Once 05/12/2019 1422 05/25/2019 1514       Time Spent in minutes  25  The patient is critically ill with multiple organ system failure and requires high complexity decision making for assessment and support, frequent evaluation and titration of therapies, advanced monitoring, review of radiographic studies and interpretation of complex data.    Oren Binet M.D on 06/12/2019 at 11:34 AM  To page go to [email or web address] - use universal password  [facility] -  Office  [phone]   See all Orders from today for further details   Admit date - 06/03/2019    10    Objective:   Vitals:   06/12/19 0738 06/12/19 0804 06/12/19 0847 06/12/19 0900  BP:  (!) 150/66 140/64 133/64  Pulse: 100 100 100 90  Resp: (!) 23 19 (!) 23 (!) 23  Temp:      TempSrc:      SpO2: 91% (!) 85% 92% 90%  Weight:      Height:        Wt Readings from Last 3 Encounters:  05/12/2019 114.4 kg  05/16/19 120.4 kg  04/12/19 120.7 kg     Intake/Output Summary (Last 24 hours) at 06/12/2019 1134 Last data filed at 06/12/2019 0400 Gross per 24 hour  Intake 265.21 ml  Output 1025 ml  Net -759.79 ml     Physical Exam General appearance:Awake, alert-on BiPAP-appears  comfortable. Eyes:no scleral icterus. HEENT: Atraumatic and Normocephalic Neck: supple, no JVD. Resp:Good air entry bilaterally, bilateral rales CVS: S1 S2 regular, no murmurs.  GI: Bowel sounds present, Non tender and not distended with no gaurding, rigidity or rebound. Extremities: B/L Lower Ext shows no edema, both legs are warm to touch Neurology:  Non focal Musculoskeletal:No digital cyanosis Skin:No Rash, warm and dry Wounds:N/A   Data Review:    CBC Recent Labs  Lab 06/06/19 0205 06/07/19 0300 06/08/19 0808 06/09/19 0347  06/10/19 0436 06/11/19 1315 06/11/19 1411 06/11/19 1419 06/12/19 0523  WBC 64.7* 60.1* 84.7* 82.4* 95.5* 95.5*  --   --  83.7*  HGB 8.9* 8.8* 9.6* 9.6* 9.6* 10.0* 10.5* 11.2* 9.8*  HCT 29.3* 29.6* 32.2* 31.6* 32.8* 32.0* 31.0* 33.0* 32.7*  PLT 130* 143* 184 198 216 228  --   --  242  MCV 98.7 98.0 97.3 97.8 99.1 96.7  --   --  97.9  MCH 30.0 29.1 29.0 29.7 29.0 30.2  --   --  29.3  MCHC 30.4 29.7* 29.8* 30.4 29.3* 31.3  --   --  30.0  RDW 14.4 14.6 14.5 14.6 14.7 14.7  --   --  14.7  LYMPHSABS 52.8* 46.0*  --   --   --   --   --   --   --   MONOABS 8.0* 10.4*  --   --   --   --   --   --   --   EOSABS 0.0 0.0  --   --   --   --   --   --   --   BASOSABS 0.2* 0.1  --   --   --   --   --   --   --     Chemistries  Recent Labs  Lab 06/08/19 0808 06/09/19 0347 06/10/19 0436 06/11/19 0405 06/11/19 1411 06/11/19 1419 06/12/19 0523  NA 137 137 138 138 134* 133* 139  K 4.1 5.2* 4.4 3.7 4.4 4.4 4.1  CL 101 103 100 103  --   --  104  CO2 25 23 25 26   --   --  24  GLUCOSE 134* 150* 157* 134*  --   --  143*  BUN 58* 60* 60* 56*  --   --  49*  CREATININE 1.78* 1.64* 1.74* 1.78*  --   --  1.77*  CALCIUM 9.1 9.3 8.9 8.9  --   --  9.1  AST 15 14* 14* 15  --   --  13*  ALT 26 24 24 19   --   --  18  ALKPHOS 50 49 50 46  --   --  44  BILITOT 0.9 0.8 0.7 0.9  --   --  0.6   ------------------------------------------------------------------------------------------------------------------ No results for input(s): CHOL, HDL, LDLCALC, TRIG, CHOLHDL, LDLDIRECT in the last 72 hours.  Lab Results  Component Value Date   HGBA1C 5.2 01/12/2019   ------------------------------------------------------------------------------------------------------------------ No results for input(s): TSH, T4TOTAL, T3FREE, THYROIDAB in the last 72 hours.  Invalid input(s): FREET3 ------------------------------------------------------------------------------------------------------------------ Recent Labs     06/12/19 0523  FERRITIN 374*    Coagulation profile No results for input(s): INR, PROTIME in the last 168 hours.  Recent Labs    06/11/19 1315 06/12/19 0523  DDIMER 0.33 <0.27    Cardiac Enzymes No results for input(s): CKMB, TROPONINI, MYOGLOBIN in the last 168 hours.  Invalid input(s): CK ------------------------------------------------------------------------------------------------------------------    Component Value Date/Time  BNP 26.6 06/12/2019 0523    Micro Results Recent Results (from the past 240 hour(s))  MRSA PCR Screening     Status: None   Collection Time: 06/11/19  3:00 AM   Specimen: Nasopharyngeal  Result Value Ref Range Status   MRSA by PCR NEGATIVE NEGATIVE Final    Comment:        The GeneXpert MRSA Assay (FDA approved for NASAL specimens only), is one component of a comprehensive MRSA colonization surveillance program. It is not intended to diagnose MRSA infection nor to guide or monitor treatment for MRSA infections. Performed at [facility], [place] [address] [place], [place] [address]     Radiology Reports Dg Chest Port 1 View  Result Date: 06/11/2019 CLINICAL DATA:  Shortness of breath EXAM: PORTABLE CHEST 1 VIEW COMPARISON:  06/09/2019 FINDINGS: Bilateral lower lobe airspace disease. Possible trace left pleural effusion. No pneumothorax. Stable cardiomediastinal silhouette. No aggressive osseous lesion. IMPRESSION: Bibasilar airspace disease concerning for pneumonia. Electronically Signed   By: Kathreen Devoid   On: 06/11/2019 09:15   Dg Chest Port 1 View  Result Date: 06/09/2019 CLINICAL DATA:  Acute hypoxic respiratory failure. EXAM: PORTABLE CHEST 1 VIEW COMPARISON:  Radiograph June 03, 2019. FINDINGS: Stable cardiomegaly. No pneumothorax is noted. Stable bibasilar opacities are noted concerning for infiltrates or multifocal pneumonia. Small left pleural effusion cannot be excluded. Bony thorax is unremarkable.  IMPRESSION: Stable bibasilar opacities are noted concerning for multifocal pneumonia. Possible small left pleural effusion may be present. Electronically Signed   By: Marijo Conception M.D.   On: 06/09/2019 11:43   Dg Chest Port 1 View  Result Date: 06/03/2019 CLINICAL DATA:  Shortness of breath EXAM: PORTABLE CHEST 1 VIEW COMPARISON:  Chest x-rays dated 05/07/2019 and 05/01/202017. FINDINGS: Persistent opacity at the LEFT lung base. Additional opacity now noted within the periphery of the LEFT mid lung. Chronic opacities noted at the RIGHT lung base. No pneumothorax seen. Stable cardiomegaly. IMPRESSION: 1. Persistent opacity at the LEFT lung base. Additional opacity now noted within the periphery of the LEFT mid lung. Findings are suspicious for multifocal pneumonia. Consider chest CT for more definitive characterization. 2. Cardiomegaly. Electronically Signed   By: Franki Cabot M.D.   On: 06/03/2019 08:02   Dg Chest Port 1 View  Result Date: 05/07/2019 CLINICAL DATA:  Shortness of breath and cough EXAM: PORTABLE CHEST 1 VIEW COMPARISON:  June 16, 2016. FINDINGS: There is an area of ill-defined opacity adjacent to the left heart border which is suspicious for an area of pneumonia. Lungs elsewhere clear. Heart is upper normal in size with pulmonary vascularity normal. No adenopathy. No bone lesions. IMPRESSION: Ill-defined opacity adjacent to the left heart border suspicious for pneumonia. Lungs elsewhere clear. Heart upper normal in size. Followup PA and lateral chest radiographs recommended in 3-4 weeks following trial of antibiotic therapy to ensure resolution and exclude underlying malignancy. Electronically Signed   By: Lowella Grip III M.D.   On: 05/15/2019 14:05

## 2019-06-12 NOTE — Progress Notes (Signed)
OT Cancellation Note  Patient Details Name: [name] Ball MRN: [id number] DOB: November 17, 1951   Cancelled Treatment:    Reason Eval/Treat Not Completed: Patient at procedure or test/ unavailable(RN attempting to place an IV. Reporting pt Spo2 in 80s on HFNC. Will hold and return as schedule allows. Thank you.)  [place], OTR/L Acute Rehab Pager: [phone] Office: [phone] 06/12/2019, 1:40 PM

## 2019-06-12 NOTE — Progress Notes (Signed)
Assisted tele visit to patient with wife.  Camar Guyton P, RN  

## 2019-06-12 NOTE — Progress Notes (Signed)
Spoke with patient's wife and she was given updates on patient. She requested face time through [facility] with patient. Called [employer] and gave them wife's info. They will send her email link so she can face time with patient.

## 2019-06-12 NOTE — Progress Notes (Signed)
PT Cancellation Note  Patient Details Name: [name] Ball MRN: [id number] DOB: 23-Jun-1951   Cancelled Treatment:    Reason Eval/Treat Not Completed: Medical issues which prohibited therapy, per RN, trying to get IV in for medication, recommends to hold therapy. Check back another time.   [phone] [name] 06/12/2019, 3:00 PM

## 2019-06-12 NOTE — Progress Notes (Signed)
Patient taken off Bipap and trialed on Heated High Flow [place] 50L, 100%.  Patient's sats ranged from 82% - 88%, but patient feels comfortable.  RT left patient off Bipap for the RN to give oral meds and do oral care.  Once complete, patient will go back on Bipap.

## 2019-06-12 NOTE — Consult Note (Signed)
NAME:  [name] Ball, MRN:  [id number], DOB:  Feb 12, 1951, LOS: 32 ADMISSION DATE:  05/25/2019, CONSULTATION DATE:  06/11/2019 REFERRING MD:  Dr Ovid Curd - triad, CHIEF COMPLAINT:  Acute on chronic resp failure  PCP Alycia Rossetti, MD     Brief History    68 year old obese male with hard of hearing and does well with sign language and if talk loudly into his year.  He can read and write.  There is a family outbreak with COVID-19 .  History from his wife.  She reports that he tested positive on 05/28/2019 due to household covid contact (daughter). Asymptomatic then and got symptomatic on 06/01/2019 and following day June 01, 2018 got admitted he declined convalescent plasma because of Jehovah witness.  He was initially improving and about to be discharged for June 12, 2019 but on June 11, 2019 he became significantly hypoxemic requiring 15 L high flow nasal cannula.  He only had minimal symptoms and is described as "happy hypoxemic".  Chest x-ray shows significant worsening of the left basal infiltrates.  There is no fever.  His initial CRP at admission June 02, 2019 was 18.1 with a peak on June 03, 2019 was 19.2 and reaching a nadir of 1.4 on June 10, 2019 and rising up to 6.6 on June 11, 2019 at the time of transfer to the ICU.  He only has minimal increased work of breathing.  His procalcitonin at time of transfer was 0.24 and his d-dimer was 0.33 /. . CCM consult 06/11/2019  Immune and selected Pmhx status  -CLL and autoimmune hemolytic anemia sine June 2019- chronic prednisone x 1 year with chronic bactrim for pcp prophylaxis x 1 years - per wife -  reports of tb and hepatis in [place] check negative per wife last checked 10-15 years ago - CKD - 3 with creat 1.4 t0 1.7 - left nephrectomy RCC in 2005. Rx procrit = Chronic COPD-?  A few liters of oxygen -?  Sleep apnea -wife says that he uses CPAP/BiPAP at home at night   [place]   June 02, 2019-admit for COVID-19 . No CP due  to [facility] witness June 11, 2019-transfer to the ICU for high flow nasal cannula oxygen requirement and worsening  Consults:  June 11, 2019 pulmonary  Procedures:  x  Significant Diagnostic Tests:  x  Micro Data:  x  Antimicrobials:/ ANti covid Rx  Chronic daily bactrim  -  Remedesvir Tocilizumab - off label 06/12/2019 Fibrogen trial - 0300-923 - CTGF MAb v Placebo - Blinded (planned)   Interim history/subjective:    7/7 - overnight started on bipap due to daily night bipap use . This morning RT/RT report that 30 min off bipap he starts looking tired. And opulse ox 85% on hfn  Worsening marker of cytokine storm with normal Procalcitonin. Still communicating and oriented.  CRP  - up a 9.5  Objective   Blood pressure 133/64, pulse 90, temperature 98.2 F (36.8 C), temperature source Axillary, resp. rate (!) 23, height 5\' 7"  (1.702 m), weight 114.4 kg, SpO2 90 %.    Vent Mode: BIPAP FiO2 (%):  [60 %-100 %] 60 % Set Rate:  [8 bmp-15 bmp] 15 bmp PEEP:  [6 cmH20] 6 cmH20   Intake/Output Summary (Last 24 hours) at 06/12/2019 1035 Last data filed at 06/12/2019 0400 Gross per 24 hour  Intake 265.21 ml  Output 1025 ml  Net -759.79 ml   Filed Weights   05/17/2019 1309 05/24/2019  1949  Weight: 120.4 kg 114.4 kg    General Appearance:  Looks criticall ill OBESE - + Head:  Normocephalic, without obvious abnormality, atraumatic Eyes:  PERRL - yes, conjunctiva/corneas - mjuddy     Ears:  Normal external ear canals, both ears Nose:  G tube - no Throat:  ETT TUBE - no , OG tube - no. BiPAP on 80% Neck:  Supple,  No enlargement/tenderness/nodules Lungs: Clear to auscultation bilaterally, Ventilator   Synchrony - on bipap yes Heart:  S1 and S2 normal, no murmur, CVP - no.  Pressors - no Abdomen:  Soft, no masses, no organomegaly Genitalia / Rectal:  Not done Extremities:  Extremities- intact Skin:  ntact in exposed areas . Sacral area - not examined Neurologic:  Sedation - none -> RASS -  +1 . Moves all 4s - yes. CAM-ICU - neg . Orientation - x 3+  - on BiPAP - need to talk loud or write or through sign [facility] Problem list   x  Assessment & Plan:  Acute on chronic hypoxemic respiratory failure in setting of baseline copd/OSA/ CLL/chronic immune suppression  - decompensation on7/05/2019 on D10 of symptoms and D15 since diagnosis - features are c/w cytokine storm   06/12/2019 - worse  PLAN -Oxygen therapy -accept pulse ox greater than 85%  -Prone positioning  (unable to do because of extreme visceral obesity)   -Continue BiPAP    -DuoNeb scheduled and Pulmicort scheduled  - Tocilizumab consideration:  Given worsening biomarkets + hypoxemia + normal PCT -> d/w wife along with Dr Nigel Bridgeman - > explained again the the 3 negative RCT but positive retrospective data -> she agred for Toci as salvage Rx fully understanding risks, benefits and limitation .Marland Kitchen   -COVID-19 research trials: She read the consent in detail for Fibrogen MAb against CTGF  [id number] study. She was ok with potential of 2 MAb Rx (above and this) . She reported that he would need benadryl  Before any MAb (based on prior Iv Ig). She understood that he could be harmed by this, improved by this v no effect. She felt this would be in contribution to science.   Best practice:  Code status: Full medical care + DNR - after talking to wife -  Per [place]   The patient [name] Ball is critically ill with multiple organ systems failure and requires high complexity decision making for assessment and support, frequent evaluation and titration of therapies, application of advanced monitoring technologies and extensive interpretation of multiple databases.   Critical Care Time devoted to patient care services described in this note is  30  Minutes. This time reflects time of care of this signee Dr Brand Males. This critical care time does not  reflect procedure time, or teaching time or supervisory time of PA/NP/Med student/Med Resident etc but could involve care discussion time     Dr. Brand Males, M.D., [facility].C.P Pulmonary and Critical Care Medicine Staff Physician [place] [facility] Pager: [phone], If no answer or between  15:00h - 7:00h: call [phone]  06/12/2019 10:35 AM   LABS for reference   LABS    PULMONARY Recent Labs  Lab 06/11/19 1411 06/11/19 1419  PHART  --  7.554*  PCO2ART  --  25.7*  PO2ART  --  31.0*  HCO3 22.8 22.7  TCO2 24 23  O2SAT 57.0 71.0    CBC Recent Labs  Lab 06/10/19 0436 06/11/19 1315 06/11/19 1411 06/11/19 1419 06/12/19 0523  HGB 9.6* 10.0* 10.5* 11.2* 9.8*  HCT 32.8* 32.0* 31.0* 33.0* 32.7*  WBC 95.5* 95.5*  --   --  83.7*  PLT 216 228  --   --  242    COAGULATION No results for input(s): INR in the last 168 hours.  CARDIAC  No results for input(s): TROPONINI in the last 168 hours. No results for input(s): PROBNP in the last 168 hours.   CHEMISTRY Recent Labs  Lab 06/08/19 0808 06/09/19 0347 06/10/19 0436 06/11/19 0405 06/11/19 1411 06/11/19 1419 06/12/19 0523  NA 137 137 138 138 134* 133* 139  K 4.1 5.2* 4.4 3.7 4.4 4.4 4.1  CL 101 103 100 103  --   --  104  CO2 25 23 25 26   --   --  24  GLUCOSE 134* 150* 157* 134*  --   --  143*  BUN 58* 60* 60* 56*  --   --  49*  CREATININE 1.78* 1.64* 1.74* 1.78*  --   --  1.77*  CALCIUM 9.1 9.3 8.9 8.9  --   --  9.1   Estimated Creatinine Clearance: 47.6 mL/min (A) (by C-G formula based on SCr of 1.77 mg/dL (H)).   LIVER Recent Labs  Lab 06/08/19 0808 06/09/19 0347 06/10/19 0436 06/11/19 0405 06/12/19 0523  AST 15 14* 14* 15 13*  ALT 26 24 24 19 18   ALKPHOS 50 49 50 46 44  BILITOT 0.9 0.8 0.7 0.9 0.6  PROT 6.7 6.3* 6.3* 6.1* 6.3*  ALBUMIN 4.0 3.8 3.8 3.5 3.5     INFECTIOUS Recent Labs  Lab 06/11/19 1315 06/12/19 0523  PROCALCITON 0.24 0.17      ENDOCRINE CBG (last 3)  No results for input(s): GLUCAP in the last 72 hours.   Results for GEMINI, BEAUMIER (MRN [id number]) as of 06/12/2019 10:38  Ref. Range 05/19/2019 13:30 06/12/2019 05:23  Troponin I (High Sensitivity) Latest Ref Range: <18 ng/L 9.00 5      IMAGING x48h  - image(s) personally visualized  -   highlighted in bold Dg Chest Port 1 View  Result Date: 06/11/2019 CLINICAL DATA:  Shortness of breath EXAM: PORTABLE CHEST 1 VIEW COMPARISON:  06/09/2019 FINDINGS: Bilateral lower lobe airspace disease. Possible trace left pleural effusion. No pneumothorax. Stable cardiomediastinal silhouette. No aggressive osseous lesion. IMPRESSION: Bibasilar airspace disease concerning for pneumonia. Electronically Signed   By: Kathreen Devoid   On: 06/11/2019 09:15

## 2019-06-12 NOTE — Progress Notes (Signed)
Obtained email address from pt's spouse to give to [facility] to set up video visit around [place] this to primary [employer].

## 2019-06-13 DIAGNOSIS — U071 COVID-19: Principal | ICD-10-CM

## 2019-06-13 DIAGNOSIS — J9601 Acute respiratory failure with hypoxia: Secondary | ICD-10-CM

## 2019-06-13 DIAGNOSIS — J1289 Other viral pneumonia: Secondary | ICD-10-CM

## 2019-06-13 DIAGNOSIS — N183 Chronic kidney disease, stage 3 (moderate): Secondary | ICD-10-CM

## 2019-06-13 DIAGNOSIS — C9110 Chronic lymphocytic leukemia of B-cell type not having achieved remission: Secondary | ICD-10-CM

## 2019-06-13 LAB — CBC
HCT: 33.2 % — ABNORMAL LOW (ref 39.0–52.0)
Hemoglobin: 9.6 g/dL — ABNORMAL LOW (ref 13.0–17.0)
MCH: 28.7 pg (ref 26.0–34.0)
MCHC: 28.9 g/dL — ABNORMAL LOW (ref 30.0–36.0)
MCV: 99.4 fL (ref 80.0–100.0)
Platelets: 255 10*3/uL (ref 150–400)
RBC: 3.34 MIL/uL — ABNORMAL LOW (ref 4.22–5.81)
RDW: 14.8 % (ref 11.5–15.5)
WBC: 99.8 10*3/uL (ref 4.0–10.5)
nRBC: 0 % (ref 0.0–0.2)

## 2019-06-13 LAB — COMPREHENSIVE METABOLIC PANEL
ALT: 18 U/L (ref 0–44)
AST: 14 U/L — ABNORMAL LOW (ref 15–41)
Albumin: 3.4 g/dL — ABNORMAL LOW (ref 3.5–5.0)
Alkaline Phosphatase: 46 U/L (ref 38–126)
Anion gap: 9 (ref 5–15)
BUN: 51 mg/dL — ABNORMAL HIGH (ref 8–23)
CO2: 24 mmol/L (ref 22–32)
Calcium: 9 mg/dL (ref 8.9–10.3)
Chloride: 107 mmol/L (ref 98–111)
Creatinine, Ser: 1.58 mg/dL — ABNORMAL HIGH (ref 0.61–1.24)
GFR calc Af Amer: 51 mL/min — ABNORMAL LOW (ref >60)
GFR calc non Af Amer: 44 mL/min — ABNORMAL LOW (ref >60)
Glucose, Bld: 127 mg/dL — ABNORMAL HIGH (ref 70–99)
Potassium: 4.9 mmol/L (ref 3.5–5.1)
Sodium: 140 mmol/L (ref 135–145)
Total Bilirubin: 0.4 mg/dL (ref 0.3–1.2)
Total Protein: 6.3 g/dL — ABNORMAL LOW (ref 6.5–8.1)

## 2019-06-13 LAB — C-REACTIVE PROTEIN: CRP: 9.4 mg/dL — ABNORMAL HIGH (ref <1.0)

## 2019-06-13 LAB — D-DIMER, QUANTITATIVE: D-Dimer, Quant: <0.27 ug/mL-FEU (ref 0.00–0.50)

## 2019-06-13 LAB — PROCALCITONIN: Procalcitonin: 0.1 ng/mL

## 2019-06-13 MED ORDER — TOCILIZUMAB 400 MG/20ML IV SOLN
800.0000 mg | Freq: Once | INTRAVENOUS | Status: AC
  Administered 2019-06-13: 800 mg via INTRAVENOUS
  Filled 2019-06-13: qty 40

## 2019-06-13 MED ORDER — STUDY - FIBROGEN-COVID - PAMREVLUMAB OR PLACEBO 10 MG/ML IV INFUSION (PI-RAMASWAMY): 35.0000 mg/kg | INTRAVENOUS | Status: DC

## 2019-06-13 MED ORDER — FLUTICASONE PROPIONATE HFA 44 MCG/ACT IN AERO
2.0000 | INHALATION_SPRAY | Freq: Two times a day (BID) | RESPIRATORY_TRACT | Status: DC
  Administered 2019-06-14 – 2019-06-16 (×5): 2 via RESPIRATORY_TRACT
  Filled 2019-06-13: qty 10.6

## 2019-06-13 MED ORDER — HEPARIN SODIUM (PORCINE) 5000 UNIT/ML IJ SOLN
7500.0000 [IU] | Freq: Three times a day (TID) | INTRAMUSCULAR | Status: DC
  Administered 2019-06-13 – 2019-06-16 (×8): 7500 [IU] via SUBCUTANEOUS
  Filled 2019-06-13 (×8): qty 2

## 2019-06-13 MED ORDER — STUDY - FIBROGEN-COVID - PAMREVLUMAB OR PLACEBO 10 MG/ML IV INFUSION (PI-RAMASWAMY): 35.0000 mg/kg | Freq: Once | INTRAVENOUS | Status: DC

## 2019-06-13 NOTE — Progress Notes (Signed)
Assisted tele visit to patient with wife.  Maryelizabeth Rowan, RN

## 2019-06-13 NOTE — Progress Notes (Addendum)
Occupational Therapy Treatment Patient Details Name: [name] Ball MRN: [id number] DOB: 1951-02-14 Today's Date: 06/13/2019    History of present illness Patient is a 68 y.o. male with PMHx of CLL, HTN, Hypothyroidism, hx of CVA(left thalamic infarct), GERD, COPD/asthma, CKD stage 3 and severe hearing impared/deafness-presented to the hospital with SOB-found to have acute hypoxic resp failure secondary to COVID 19 PNA.    OT comments  Pt presenting with decreased activity tolerance but continues to be motivated to participate in therapy. Pt performing bed mobility with supervision and increased time and effort. Pt tolerating sitting at EOB for ~3 minutes for wash his back and having increased coughing. SpO2 dropping to 60s on 50L via HFNC FiO2 100%; requiring increased time and returning to sidelying to elevate to 70s. RN present throughout. Will continue to follow acutely as admitted and follow up to determine home vs post-acute rehab.    Follow Up Recommendations  Home health OT;Supervision/Assistance - 24 hour(initially)    Equipment Recommendations  3 in 1 bedside commode(WIDE)    Recommendations for Other Services      Precautions / Restrictions Precautions Precautions: Fall Precaution Comments: Watch stats Restrictions Weight Bearing Restrictions: No       Mobility Bed Mobility Overal bed mobility: Needs Assistance Bed Mobility: Supine to Sit;Sit to Supine     Supine to sit: Supervision Sit to supine: Supervision   General bed mobility comments: Increased time  Transfers                 General transfer comment: Deferred due to vitals    Balance Overall balance assessment: Needs assistance Sitting-balance support: No upper extremity supported;Feet supported Sitting balance-Leahy Scale: Good                                     ADL either performed or assessed with clinical judgement   ADL Overall ADL's : Needs assistance/impaired                  Upper Body Dressing : Maximal assistance;Sitting Upper Body Dressing Details (indicate cue type and reason): While sitting at EOB, pt requiring Max A for washing his back                   General ADL Comments: Pt agreeable to sit at EOB (tolerating ~3 minutes). Once at EOB, pt coughing and SpO2 dropping to 60s on 50L O2 via HFNC at 100% FiO2. RN present throughout     [place]?: No apparent visual deficits   Perception     Praxis      Cognition Arousal/Alertness: Awake/alert Behavior During Therapy: WFL for tasks assessed/performed Overall Cognitive Status: Within Functional Limits for tasks assessed                                 General Comments: Continues to enjoy joking despite feeling poorly. Feel close to baseline cognition.        Exercises     Shoulder Instructions       General Comments Pt reporting he is unable to find his glasses (brown, rough frames; bifocals)    Pertinent Vitals/ Pain       Pain Assessment: Faces Faces Pain Scale: Hurts even more Pain Location: Chest with increased coughing Pain Descriptors / Indicators: Constant;Discomfort Pain Intervention(s): Monitored during session;Limited  activity within patient's tolerance;Repositioned  Home Living                                          Prior Functioning/Environment              Frequency  Min 3X/week        Progress Toward Goals  OT Goals(current goals can now be found in the care plan section)  Progress towards OT goals: Not progressing toward goals - comment(Decreased activity tolerance)  Acute Rehab OT Goals Patient Stated Goal: To get well and home to grandchildren OT Goal Formulation: With patient Time For Goal Achievement: 06/18/19 Potential to Achieve Goals: Good ADL Goals Pt Will Perform Grooming: with supervision;standing Pt Will Perform Upper Body Dressing: with modified  independence;sitting Pt Will Perform Lower Body Dressing: with min guard assist;sit to/from stand Pt Will Transfer to Toilet: with supervision;ambulating Pt Will Perform Toileting - Clothing Manipulation and hygiene: with modified independence;sit to/from stand Pt/caregiver will Perform Home Exercise Program: Both right and left upper extremity;With theraband;Independently;With written HEP provided Additional ADL Goal #1: Pt will recall and implement 3 energy conservation techniques during ADL routine at independent level  Plan Discharge plan remains appropriate    Co-evaluation                 AM-PAC OT "6 Clicks" Daily Activity     Outcome Measure   Help from another person eating meals?: None Help from another person taking care of personal grooming?: A Little Help from another person toileting, which includes using toliet, bedpan, or urinal?: A Little Help from another person bathing (including washing, rinsing, drying)?: A Little Help from another person to put on and taking off regular upper body clothing?: A Little Help from another person to put on and taking off regular lower body clothing?: A Lot 6 Click Score: 18    End of Session Equipment Utilized During Treatment: Oxygen(50L via HFNC at fiO2 100%)  OT Visit Diagnosis: Unsteadiness on feet (R26.81);Muscle weakness (generalized) (M62.81);Pain Pain - Right/Left: Right Pain - part of body: Knee   Activity Tolerance Patient tolerated treatment well   Patient Left in bed;with call bell/phone within reach;with nursing/sitter in room   Nurse Communication Mobility status        Time: 1749-4496 OT Time Calculation (min): 19 min  Charges: OT General Charges $OT Visit: 1 Visit OT Treatments $Self Care/Home Management : 8-22 mins  [place], OTR/L Acute Rehab Pager: [phone] Office: [place] 06/13/2019, 4:03 PM

## 2019-06-13 NOTE — Progress Notes (Signed)
Pt tolerated HFNC for the night and did not require bipap.  RT will continue to monitor.

## 2019-06-13 NOTE — Progress Notes (Signed)
Spoke with patient's wife, [name] Ball and provided full update/answered all questions. Assisted with Elink video visit.

## 2019-06-13 NOTE — Progress Notes (Signed)
NAME:  [name] Ball, MRN:  [id number], DOB:  Feb 27, 1951, LOS: 56 ADMISSION DATE:  05/26/2019, CONSULTATION DATE:  7/6 REFERRING MD: Elgergawy, CHIEF COMPLAINT:  Dyspnea   Brief History   68 y/o male with ARDS from COVID 19 pneumonia.   Past Medical History  CLL, chronic prednisone CKD COPD on home oxygen OSA on CPAP/BIPAP Diverticulitis Asthma Hypothyroid  Significant Hospital Events   June 27 admission July 6   Consults:  PCCM  Procedures:    Significant Diagnostic Tests:    Micro Data:    Antimicrobials:  Bactrim > chronic 6/27 Remdesivir > 7/1, restarted 7/6  7/7 Tocilizumab >  7/7 Enrolled in clinical trial [employer] study of Pamrevlumab  Interim history/subjective:  Resting comfortably overnight Says he feels better this morning  Objective   Blood pressure 139/66, pulse 87, temperature 98.4 F (36.9 C), temperature source Oral, resp. rate (!) 25, height 5\' 7"  (1.702 m), weight 114.4 kg, SpO2 (!) 80 %.    Vent Mode: BIPAP FiO2 (%):  [60 %-100 %] 100 % Set Rate:  [15 bmp] 15 bmp PEEP:  [6 cmH20] 6 cmH20   Intake/Output Summary (Last 24 hours) at 06/13/2019 0719 Last data filed at 06/13/2019 0600 Gross per 24 hour  Intake 383.39 ml  Output 1030 ml  Net -646.61 ml   Filed Weights   05/31/2019 1309 05/10/2019 1949  Weight: 120.4 kg 114.4 kg    Examination:  General:  Resting comfortably in bed HENT: NCAT OP clear PULM: CTA B, normal effort CV: RRR, no mgr GI: BS+, soft, nontender MSK: normal bulk and tone Neuro: awake, alert, no distress, MAEW   Resolved Hospital Problem list     Assessment & Plan:  Severe Acute Respiratory Failure with hypoxemia due to COVID 19 Pneumonia Enrolled in Fibrogen study protocol, care per protocol Hold further remdesivir as he has received 7 days Continue solumedrol 40mg  IV q12h Administer O2 via heated high flow  Tolerate periods of hypoxemia, goal at rest is greater than 85% SaO2, with movement ideally  above 75% Decision for intubation should be based on a change in mental status or physical evidence of ventilatory failure such as nasal flaring, accessory muscle use, paradoxical breathing Out of bed to chair as able Prone positioning while in bed Consider repeat actemra based on labs  OSA CPAP qHS  Best practice:  Diet: full Pain/Anxiety/Delirium protocol (if indicated): none baseline VAP protocol (if indicated): n/a DVT prophylaxis: yes> sub q heparin, increase dose to COVID ICU Protocol enhanced dosing GI prophylaxis: sub q heparin Glucose control: SSI Mobility: bedrest Code Status: full Family Communication: per [facility] Disposition: remain in ICU  Labs   CBC: Recent Labs  Lab 06/07/19 0300 06/08/19 0808 06/09/19 0347 06/10/19 0436 06/11/19 1315 06/11/19 1411 06/11/19 1419 06/12/19 0523  WBC 60.1* 84.7* 82.4* 95.5* 95.5*  --   --  83.7*  NEUTROABS 3.6  --   --   --   --   --   --   --   HGB 8.8* 9.6* 9.6* 9.6* 10.0* 10.5* 11.2* 9.8*  HCT 29.6* 32.2* 31.6* 32.8* 32.0* 31.0* 33.0* 32.7*  MCV 98.0 97.3 97.8 99.1 96.7  --   --  97.9  PLT 143* 184 198 216 228  --   --  466    Basic Metabolic Panel: Recent Labs  Lab 06/08/19 0808 06/09/19 0347 06/10/19 0436 06/11/19 0405 06/11/19 1411 06/11/19 1419 06/12/19 0523  NA 137 137 138 138 134* 133* 139  K  4.1 5.2* 4.4 3.7 4.4 4.4 4.1  CL 101 103 100 103  --   --  104  CO2 25 23 25 26   --   --  24  GLUCOSE 134* 150* 157* 134*  --   --  143*  BUN 58* 60* 60* 56*  --   --  49*  CREATININE 1.78* 1.64* 1.74* 1.78*  --   --  1.77*  CALCIUM 9.1 9.3 8.9 8.9  --   --  9.1   GFR: Estimated Creatinine Clearance: 47.6 mL/min (A) (by C-G formula based on SCr of 1.77 mg/dL (H)). Recent Labs  Lab 06/09/19 0347 06/10/19 0436 06/11/19 1315 06/12/19 0523  PROCALCITON  --   --  0.24 0.17  WBC 82.4* 95.5* 95.5* 83.7*    Liver Function Tests: Recent Labs  Lab 06/08/19 0808 06/09/19 0347 06/10/19 0436 06/11/19 0405 06/12/19  0523  AST 15 14* 14* 15 13*  ALT 26 24 24 19 18   ALKPHOS 50 49 50 46 44  BILITOT 0.9 0.8 0.7 0.9 0.6  PROT 6.7 6.3* 6.3* 6.1* 6.3*  ALBUMIN 4.0 3.8 3.8 3.5 3.5   No results for input(s): LIPASE, AMYLASE in the last 168 hours. No results for input(s): AMMONIA in the last 168 hours.  ABG    Component Value Date/Time   PHART 7.554 (H) 06/11/2019 1419   PCO2ART 25.7 (L) 06/11/2019 1419   PO2ART 31.0 (LL) 06/11/2019 1419   HCO3 22.7 06/11/2019 1419   TCO2 23 06/11/2019 1419   O2SAT 71.0 06/11/2019 1419     Coagulation Profile: No results for input(s): INR, PROTIME in the last 168 hours.  Cardiac Enzymes: No results for input(s): CKTOTAL, CKMB, CKMBINDEX, TROPONINI in the last 168 hours.  HbA1C: Hgb A1C (fingerstick)  Date/Time Value Ref Range Status  05/25/2016 09:01 AM 6.0 (H) <5.7 % Final    Comment:                                                                           According to the ADA Clinical Practice Recommendations for 2011, when HbA1c is used as a screening test:     >=6.5%   Diagnostic of Diabetes Mellitus            (if abnormal result is confirmed)   5.7-6.4%   Increased risk of developing Diabetes Mellitus   References:Diagnosis and Classification of Diabetes Mellitus,Diabetes QQVZ,5638,75(IEPPI 1):S62-S69 and Standards of Medical Care in         Diabetes - 2011,Diabetes RJJO,8416,60 (Suppl 1):S11-S61.      Hgb A1c MFr Bld  Date/Time Value Ref Range Status  01/12/2019 12:01 PM 5.2 <5.7 % of total Hgb Final    Comment:    For the purpose of screening for the presence of diabetes: . <5.7%       Consistent with the absence of diabetes 5.7-6.4%    Consistent with increased risk for diabetes             (prediabetes) > or =6.5%  Consistent with diabetes . This assay result is consistent with a decreased risk of diabetes. . Currently, no consensus exists regarding use of hemoglobin A1c for diagnosis of diabetes in children. . According to  American Diabetes Association (ADA) guidelines, hemoglobin A1c <7.0% represents optimal control in non-pregnant diabetic patients. Different metrics may apply to specific patient populations.  Standards of Medical Care in Diabetes(ADA). Marland Kitchen   04/07/2018 11:31 AM <4.0 <5.7 % of total Hgb Final    Comment:    Verified by repeat analysis. . For the purpose of screening for the presence of diabetes: . <5.7%       Consistent with the absence of diabetes 5.7-6.4%    Consistent with increased risk for diabetes             (prediabetes) > or =6.5%  Consistent with diabetes . This assay result is consistent with a decreased risk of diabetes. . Currently, no consensus exists regarding use of hemoglobin A1c for diagnosis of diabetes in children. . According to American Diabetes Association (ADA) guidelines, hemoglobin A1c <7.0% represents optimal control in non-pregnant diabetic patients. Different metrics may apply to specific patient populations.  Standards of Medical Care in Diabetes(ADA). .     CBG: No results for input(s): GLUCAP in the last 168 hours.   Critical care time: 35 minutes    Roselie Awkward, MD [place] [facility] Pager: [phone] Cell: [phone] If no response, call [phone]

## 2019-06-13 NOTE — Progress Notes (Addendum)
PROGRESS NOTE  [name] Ball LPF:[id number] DOB: 1951/08/29 DOA: 06/04/2019  PCP: Alycia Rossetti, MD  Brief History/Interval Summary: Patient is a 68 y.o. male with PMHx of CLL, HTN, Hypothyroidism, hx of CVA, GERD, COPD/asthma, CKD stage 3 and severe hearing impared/deafness-presented to the hospital with SOB-found to have acute hypoxic resp failure secondary to COVID 19 PNA.  Patient was treated with Remdesivir and IV steroids with improvement-unfortunately on 7/6-patient's hypoxemia worsened-requiring 15 L high flow oxygen-and was subsequently transferred to the ICU.  Reason for Visit: Acute respiratory disease due to COVID-19  Consultants: Pulmonology  Procedures: None  Antibiotics: Anti-infectives (From admission, onward)   Start     Dose/Rate Route Frequency Ordered Stop   06/11/19 1830  remdesivir 100 mg in sodium chloride 0.9 % 250 mL IVPB  Status:  Discontinued     100 mg 500 mL/hr over 30 Minutes Intravenous Every 24 hours 06/11/19 1710 06/13/19 1131   06/04/19 1000  sulfamethoxazole-trimethoprim (BACTRIM DS) 800-160 MG per tablet 1 tablet     1 tablet Oral Every M-W-F 05/10/2019 1551     06/03/19 2100  remdesivir 100 mg in sodium chloride 0.9 % 250 mL IVPB     100 mg 500 mL/hr over 30 Minutes Intravenous Every 24 hours 05/27/2019 1956 06/06/19 2143   05/17/2019 2100  remdesivir 200 mg in sodium chloride 0.9 % 250 mL IVPB     200 mg 500 mL/hr over 30 Minutes Intravenous Once 05/28/2019 1956 06/05/2019 2315   05/21/2019 1430  vancomycin (VANCOCIN) IVPB 1000 mg/200 mL premix     1,000 mg 200 mL/hr over 60 Minutes Intravenous  Once 05/10/2019 1422 06/05/2019 1547   06/01/2019 1430  piperacillin-tazobactam (ZOSYN) IVPB 3.375 g     3.375 g 100 mL/hr over 30 Minutes Intravenous  Once 05/18/2019 1422 05/11/2019 1514       Subjective/Interval History: Patient seems to be in good spirits.  He is cracking jokes.  States that he is feeling better.  Denies any chest pain.     Assessment/Plan:  Acute Hypoxic Resp. Failure due to Acute Covid 19 Viral Illness  COVID-19 Labs  Recent Labs    06/11/19 1315 06/12/19 0523 06/13/19 0740  DDIMER 0.33 <0.27 <0.27  FERRITIN  --  374*  --   CRP 6.6* 9.5* 9.4*    Lab Results  Component Value Date   SARSCOV2NAA Detected (A) 05/28/2019   Patient was initially hospitalized and placed on Remdesivir and steroids.  He did show improvement.  However patient acutely decompensated on 7/6.  He was transferred to the intensive care unit and placed on BiPAP.  Over the last 24 hours patient has been afebrile.  Was transitioned to high flow nasal cannula yesterday at 60 L/min.  Saturations appear to be in the late 80s to 90s.  Patient had completed 5-day course of Remdesivir on 7/1.  Was reinitiated on 7/6.  Patient also on Solu-Medrol.  Patient was given Actemra x1 on 7/7.  CRP remains elevated at 9.4.  D-dimer less than 0.27.  Give additional dose of Actemra today.  Chest X-ray from 7/6 showed bibasilar airspace disease.  Procalcitonin 0.1.  Mobilize as much as possible.  Prone positioning as much as possible.  Incentive spirometry.  Pulmonology is following.  Patient has been enrolled into the fibrogen research study as well.  COVID-19 medications: 7/7>> Actemra 7/7>> Fibrogen trial - 5329-924 - CTGF MAb v Placebo - Blinded (planned) (Consented by Dr Chase Caller) 6/27>> Solumedrol 6/27>>Remdesivir (extended to 10-day  course due to clinical worsening)  History of CLL Patient has significant leukocytosis at baseline.  Patient is on chronic steroids (prednisolone 5 mg daily).  Also is on prophylactic Bactrim.  Follow-up with oncology after discharge.  History of chronic kidney disease stage III Remains at baseline.  Monitor urine output.  Normocytic anemia Monitor hemoglobin closely.  No evidence for overt bleeding.  Hyperkalemia Patient noted to have elevated potassium levels previously.  He required several doses of  Lokelma.  Has improved.  Continue to monitor closely as patient is on Bactrim.  History of hypothyroidism Continue Synthroid  History of COPD/asthma Stable.  Continue bronchodilators.  History of CVA Continue Plavix.  Stable.  History of obstructive sleep apnea Stable.  Morbid obesity BMI is 40.37  Essential hypertension Pressure is reasonably well controlled.  Continue to monitor.  History of GERD Continue PPI  History of autoimmune hemolytic anemia related to CLL Hemoglobin stable.  Is on steroids currently.  History of depression Continue Celexa.  Significant hearing impairment/deafness  Jehovah's witness Patient refuses all blood products including convalescent plasma.  Goals of care Patient's prognosis remains guarded to poor.  He states that he is feeling better but his oxygenation remains poor.  He is on extremely high FiO2.  Will discuss with his family members.  Continue DNR status.  DVT Prophylaxis: Heparin subcutaneously PUD Prophylaxis: Protonix Code Status: DNR Family Communication: Updated his wife today. Disposition Plan: Remain in ICU for now   Medications:  Scheduled: . chlorhexidine  15 mL Mouth Rinse BID  . Chlorhexidine Gluconate Cloth  6 each Topical Daily  . citalopram  10 mg Oral Daily  . clopidogrel  75 mg Oral Daily  . fluticasone  2 puff Inhalation BID  . folic acid  1 mg Oral Daily  . heparin  5,000 Units Subcutaneous Q8H  . levothyroxine  50 mcg Oral Q0600  . mouth rinse  15 mL Mouth Rinse q12n4p  . methylPREDNISolone (SOLU-MEDROL) injection  40 mg Intravenous Q12H  . metoprolol tartrate  12.5 mg Oral BID  . pantoprazole  40 mg Oral Daily  . [START ON 06/19/2019] pamrevlumab or placebo  35 mg/kg (Order-Specific) Intravenous Q7 days   Followed by  . [START ON 07/03/2019] pamrevlumab or placebo  35 mg/kg (Order-Specific) Intravenous Once  . sulfamethoxazole-trimethoprim  1 tablet Oral Q M,W,F   Continuous:  OVZ:[id number],  diphenhydrAMINE, diphenhydrAMINE, guaiFENesin-dextromethorphan, HYDROcodone-acetaminophen, ipratropium-albuterol, ondansetron **OR** ondansetron (ZOFRAN) IV   Objective:  Vital Signs  Vitals:   06/13/19 0800 06/13/19 0843 06/13/19 0900 06/13/19 1000  BP: 132/62  (!) 151/59 (!) 143/58  Pulse: 85 97 96 96  Resp: (!) 22  20 (!) 22  Temp: 98.1 F (36.7 C)     TempSrc: Oral     SpO2: (!) 85%  (!) 84% (!) 82%  Weight:      Height:        Intake/Output Summary (Last 24 hours) at 06/13/2019 1228 Last data filed at 06/13/2019 0800 Gross per 24 hour  Intake 583.39 ml  Output 1255 ml  Net -671.61 ml   Filed Weights   06/01/2019 1309 05/29/2019 1949  Weight: 120.4 kg 114.4 kg    General appearance: Awake alert.  In no distress Resp: Coarse breath sounds bilaterally.  Tachypneic at rest.  Few crackles at the bases.  No wheezing or rhonchi. Cardio: S1-S2 is normal regular.  No S3-S4.  No rubs murmurs or bruit GI: Abdomen is soft.  Nontender nondistended.  Bowel sounds are present normal.  No masses organomegaly Extremities: No edema.  Full range of motion of lower extremities. Neurologic: Alert and oriented x3.  No focal neurological deficits.    Lab Results:  Data Reviewed: I have personally reviewed following labs and imaging studies  CBC: Recent Labs  Lab 06/07/19 0300  06/09/19 0347 06/10/19 0436 06/11/19 1315 06/11/19 1411 06/11/19 1419 06/12/19 0523 06/13/19 0740  WBC 60.1*   < > 82.4* 95.5* 95.5*  --   --  83.7* 99.8*  NEUTROABS 3.6  --   --   --   --   --   --   --   --   HGB 8.8*   < > 9.6* 9.6* 10.0* 10.5* 11.2* 9.8* 9.6*  HCT 29.6*   < > 31.6* 32.8* 32.0* 31.0* 33.0* 32.7* 33.2*  MCV 98.0   < > 97.8 99.1 96.7  --   --  97.9 99.4  PLT 143*   < > 198 216 228  --   --  242 255   < > = values in this interval not displayed.    Basic Metabolic Panel: Recent Labs  Lab 06/09/19 0347 06/10/19 0436 06/11/19 0405 06/11/19 1411 06/11/19 1419 06/12/19 0523 06/13/19  0740  NA 137 138 138 134* 133* 139 140  K 5.2* 4.4 3.7 4.4 4.4 4.1 4.9  CL 103 100 103  --   --  104 107  CO2 23 25 26   --   --  24 24  GLUCOSE 150* 157* 134*  --   --  143* 127*  BUN 60* 60* 56*  --   --  49* 51*  CREATININE 1.64* 1.74* 1.78*  --   --  1.77* 1.58*  CALCIUM 9.3 8.9 8.9  --   --  9.1 9.0    GFR: Estimated Creatinine Clearance: 53.3 mL/min (A) (by C-G formula based on SCr of 1.58 mg/dL (H)).  Liver Function Tests: Recent Labs  Lab 06/09/19 0347 06/10/19 0436 06/11/19 0405 06/12/19 0523 06/13/19 0740  AST 14* 14* 15 13* 14*  ALT 24 24 19 18 18   ALKPHOS 49 50 46 44 46  BILITOT 0.8 0.7 0.9 0.6 0.4  PROT 6.3* 6.3* 6.1* 6.3* 6.3*  ALBUMIN 3.8 3.8 3.5 3.5 3.4*    Anemia Panel: Recent Labs    06/12/19 0523  FERRITIN 374*    Recent Results (from the past 240 hour(s))  MRSA PCR Screening     Status: None   Collection Time: 06/11/19  3:00 AM   Specimen: Nasopharyngeal  Result Value Ref Range Status   MRSA by PCR NEGATIVE NEGATIVE Final    Comment:        The GeneXpert MRSA Assay (FDA approved for NASAL specimens only), is one component of a comprehensive MRSA colonization surveillance program. It is not intended to diagnose MRSA infection nor to guide or monitor treatment for MRSA infections. Performed at [facility], [place] [address], [place], [place] [address]       Radiology Studies: No results found.     LOS: 11 days   Nazareth Kirk [employer] on [email or web address]  06/13/2019, 12:28 PM

## 2019-06-14 ENCOUNTER — Inpatient Hospital Stay (HOSPITAL_COMMUNITY): Payer: Medicare Other

## 2019-06-14 LAB — LEGIONELLA PNEUMOPHILA SEROGP 1 UR AG: L. pneumophila Serogp 1 Ur Ag: NEGATIVE

## 2019-06-14 LAB — CBC
HCT: 34.1 % — ABNORMAL LOW (ref 39.0–52.0)
Hemoglobin: 9.8 g/dL — ABNORMAL LOW (ref 13.0–17.0)
MCH: 28.8 pg (ref 26.0–34.0)
MCHC: 28.7 g/dL — ABNORMAL LOW (ref 30.0–36.0)
MCV: 100.3 fL — ABNORMAL HIGH (ref 80.0–100.0)
Platelets: 264 10*3/uL (ref 150–400)
RBC: 3.4 MIL/uL — ABNORMAL LOW (ref 4.22–5.81)
RDW: 14.7 % (ref 11.5–15.5)
WBC: 99.7 10*3/uL (ref 4.0–10.5)
nRBC: 0 % (ref 0.0–0.2)

## 2019-06-14 LAB — QUANTIFERON-TB GOLD PLUS (RQFGPL)
QuantiFERON Mitogen Value: 0.09 IU/mL
QuantiFERON Nil Value: 0.02 IU/mL
QuantiFERON TB1 Ag Value: 0.02 IU/mL
QuantiFERON TB2 Ag Value: 0.02 IU/mL

## 2019-06-14 LAB — QUANTIFERON-TB GOLD PLUS: QuantiFERON-TB Gold Plus: UNDETERMINED — AB

## 2019-06-14 LAB — D-DIMER, QUANTITATIVE: D-Dimer, Quant: <0.27 ug/mL-FEU (ref 0.00–0.50)

## 2019-06-14 LAB — C-REACTIVE PROTEIN: CRP: 3.6 mg/dL — ABNORMAL HIGH (ref <1.0)

## 2019-06-14 MED ORDER — FUROSEMIDE 10 MG/ML IJ SOLN
40.0000 mg | Freq: Once | INTRAMUSCULAR | Status: AC
  Administered 2019-06-14: 40 mg via INTRAVENOUS
  Filled 2019-06-14: qty 4

## 2019-06-14 MED ORDER — HYDROCOD POLST-CPM POLST ER 10-8 MG/5ML PO SUER
5.0000 mL | Freq: Two times a day (BID) | ORAL | Status: DC | PRN
  Administered 2019-06-14: 5 mL via ORAL
  Filled 2019-06-14 (×2): qty 5

## 2019-06-14 MED ORDER — BENZONATATE 100 MG PO CAPS
100.0000 mg | ORAL_CAPSULE | Freq: Three times a day (TID) | ORAL | Status: DC
  Administered 2019-06-14 – 2019-06-16 (×7): 100 mg via ORAL
  Filled 2019-06-14 (×7): qty 1

## 2019-06-14 NOTE — Progress Notes (Signed)
Spoke with patient's wife, Zigmund Daniel, and provided full update/answered all questions. Assisted with Elink video visit.

## 2019-06-14 NOTE — Plan of Care (Signed)
  Problem: Activity: Goal: Risk for activity intolerance will decrease Outcome: Progressing   Problem: Pain Managment: Goal: General experience of comfort will improve Outcome: Completed/Met   Problem: Education: Goal: Knowledge of General Education information will improve Description: Including pain rating scale, medication(s)/side effects and non-pharmacologic comfort measures Outcome: Progressing

## 2019-06-14 NOTE — Progress Notes (Signed)
PT Cancellation Note  Patient Details Name: [name] MRN: [id number] DOB: [birth year]   Cancelled Treatment:    Reason Eval/Treat Not Completed: Medical issues which prohibited therapy, per RN, therapy yesterday did not go well with sats dropping into 60's and long recovery time.Will check back another day.   [name] 06/14/2019, 2:13 PM [phone]

## 2019-06-14 NOTE — Progress Notes (Signed)
Assisted tele visit to patient with husband and wife.  Alver Sorrow, RN

## 2019-06-14 NOTE — Progress Notes (Signed)
PROGRESS NOTE  [name] Ball ERD:[id number] DOB: 02-Jun-1951 DOA: 06/01/2019  PCP: Alycia Rossetti, MD  Brief History/Interval Summary: Patient is a 68 y.o. male with PMHx of CLL, HTN, Hypothyroidism, hx of CVA, GERD, COPD/asthma, CKD stage 3 and severe hearing impared/deafness-presented to the hospital with SOB-found to have acute hypoxic resp failure secondary to COVID 19 PNA.  Patient was treated with Remdesivir and IV steroids with improvement-unfortunately on 7/6-patient's hypoxemia worsened-requiring 15 L high flow oxygen-and was subsequently transferred to the ICU.  Reason for Visit: Acute respiratory disease due to COVID-19  Consultants: Pulmonology  Procedures: None  Antibiotics: Anti-infectives (From admission, onward)   Start     Dose/Rate Route Frequency Ordered Stop   06/11/19 1830  remdesivir 100 mg in sodium chloride 0.9 % 250 mL IVPB  Status:  Discontinued     100 mg 500 mL/hr over 30 Minutes Intravenous Every 24 hours 06/11/19 1710 06/13/19 1131   06/04/19 1000  sulfamethoxazole-trimethoprim (BACTRIM DS) 800-160 MG per tablet 1 tablet     1 tablet Oral Every M-W-F 06/04/2019 1551     06/03/19 2100  remdesivir 100 mg in sodium chloride 0.9 % 250 mL IVPB     100 mg 500 mL/hr over 30 Minutes Intravenous Every 24 hours 05/25/2019 1956 06/06/19 2143   05/20/2019 2100  remdesivir 200 mg in sodium chloride 0.9 % 250 mL IVPB     200 mg 500 mL/hr over 30 Minutes Intravenous Once 05/26/2019 1956 05/22/2019 2315   05/13/2019 1430  vancomycin (VANCOCIN) IVPB 1000 mg/200 mL premix     1,000 mg 200 mL/hr over 60 Minutes Intravenous  Once 05/26/2019 1422 05/25/2019 1547   05/30/2019 1430  piperacillin-tazobactam (ZOSYN) IVPB 3.375 g     3.375 g 100 mL/hr over 30 Minutes Intravenous  Once 05/31/2019 1422 05/25/2019 1514       Subjective/Interval History: Patient noted to be on BiPAP this morning.  He wanted to sleep and and so it was not removed.  Patient states that he was doing well up until  this morning when he had a coughing episode.  He slept okay during the course of the night.  Cough is dry according to nursing staff.      Assessment/Plan:  Acute Hypoxic Resp. Failure due to Acute Covid 19 Viral Illness  COVID-19 Labs  Recent Labs    06/12/19 0523 06/13/19 0740 06/14/19 0555  DDIMER <0.27 <0.27 <0.27  FERRITIN 374*  --   --   CRP 9.5* 9.4* 3.6*    Lab Results  Component Value Date   SARSCOV2NAA Detected (A) 05/28/2019   Patient was initially hospitalized and placed on Remdesivir and steroids.  He did show improvement.  However patient acutely decompensated on 7/6.  He was transferred to the intensive care unit and placed on BiPAP.  Patient's respiratory status remains stable.  He is still requiring high FiO2.  He is on 80% FiO2 this morning.  He saturating in the 90s.  He has completed a course of Remdesivir.  He remains on steroids.  He was given Actemra on 7/7 and on 7/8.  CRP has improved from 9.4 to 3.6.  Patient will be given a dose of furosemide today.  Chest x-ray from 7/6 showed bibasilar airspace disease.  Repeat chest x-ray tomorrow morning.  Continue to mobilize as much as possible.  Prone positioning as much as possible.  Incentive spirometry.  Pulmonology continues to follow.   Patient has been enrolled into the fibrogen research study  as well.  COVID-19 medications: 7/8>> Actemra 7/7>> Actemra 7/7>> Fibrogen trial - 4193-790 - CTGF MAb v Placebo - Blinded (planned) (Consented by Dr Chase Caller) 6/27>> Solumedrol 6/27>>Remdesivir   History of CLL Patient has significant leukocytosis at baseline.  Patient is on chronic steroids (prednisolone 5 mg daily).  Also is on prophylactic Bactrim.  Follow-up with oncology after discharge.  History of chronic kidney disease stage III Has been at baseline.  Monitor urine output.  Labs not back from this morning.  Normocytic anemia Monitor hemoglobin closely.  No evidence for overt bleeding.  Hyperkalemia  Patient noted to have elevated potassium levels previously.  He required several doses of Lokelma.  Potassium has been normal the last several days.  Continue to monitor closely as patient is on Bactrim.  History of hypothyroidism Continue Synthroid.  History of COPD/asthma Stable.  Continue bronchodilators.  History of CVA Continue Plavix.  Stable.  History of obstructive sleep apnea Stable.  Morbid obesity BMI is 40.37  Essential hypertension Blood pressure is reasonably well controlled.  Continue to monitor.    History of GERD Continue PPI  History of autoimmune hemolytic anemia related to CLL Hemoglobin remains stable.    History of depression Continue Celexa.  Significant hearing impairment/deafness  Jehovah's witness Patient refuses all blood products including convalescent plasma.  Goals of care Patient remains stable but his prognosis remains guarded to poor.  Have discussed this with his spouse as well.  He remains on high FiO2.  Inflammatory markers have shown some improvement today.  Continue to monitor for now.  Continue DNR status.    DVT Prophylaxis: Heparin subcutaneously PUD Prophylaxis: Protonix Code Status: DNR Family Communication: Will update his wife later today. Disposition Plan: Remain in ICU for now   Medications:  Scheduled: . benzonatate  100 mg Oral TID  . chlorhexidine  15 mL Mouth Rinse BID  . Chlorhexidine Gluconate Cloth  6 each Topical Daily  . citalopram  10 mg Oral Daily  . clopidogrel  75 mg Oral Daily  . fluticasone  2 puff Inhalation BID  . folic acid  1 mg Oral Daily  . heparin  7,500 Units Subcutaneous Q8H  . levothyroxine  50 mcg Oral Q0600  . mouth rinse  15 mL Mouth Rinse q12n4p  . methylPREDNISolone (SOLU-MEDROL) injection  40 mg Intravenous Q12H  . metoprolol tartrate  12.5 mg Oral BID  . pantoprazole  40 mg Oral Daily  . [START ON 06/19/2019] pamrevlumab or placebo  35 mg/kg (Order-Specific) Intravenous Q7 days    Followed by  . [START ON 07/03/2019] pamrevlumab or placebo  35 mg/kg (Order-Specific) Intravenous Once  . sulfamethoxazole-trimethoprim  1 tablet Oral Q M,W,F   Continuous:  WIO:[id number], chlorpheniramine-HYDROcodone, diphenhydrAMINE, guaiFENesin-dextromethorphan, HYDROcodone-acetaminophen, ipratropium-albuterol, ondansetron **OR** ondansetron (ZOFRAN) IV   Objective:  Vital Signs  Vitals:   06/14/19 0512 06/14/19 0600 06/14/19 0700 06/14/19 0800  BP:  (!) 151/73 (!) 149/67 (!) 144/67  Pulse: 71 87 74 82  Resp: (!) 30 (!) 28 20 (!) 28  Temp:      TempSrc:      SpO2: 92% (!) 89% 92% (!) 89%  Weight:      Height:        Intake/Output Summary (Last 24 hours) at 06/14/2019 1048 Last data filed at 06/14/2019 0800 Gross per 24 hour  Intake 580 ml  Output 1240 ml  Net -660 ml   Filed Weights   06/05/2019 1309 06/05/2019 1949  Weight: 120.4 kg 114.4 kg  General appearance: Awake alert.  In no distress.  Hard of hearing Resp: Mildly tachypneic at rest.  Coarse breath sounds bilaterally.  Few crackles at the bases.  No wheezing or rhonchi. Cardio: S1-S2 is normal regular.  No S3-S4.  No rubs murmurs or bruit GI: Abdomen is soft.  Nontender nondistended.  Bowel sounds are present normal.  No masses organomegaly Extremities: Minimal Edema bilateral lower extremities.   Neurologic: No focal neurological deficits.   Lab Results:  Data Reviewed: I have personally reviewed following labs and imaging studies  CBC: Recent Labs  Lab 06/10/19 0436 06/11/19 1315 06/11/19 1411 06/11/19 1419 06/12/19 0523 06/13/19 0740 06/14/19 0555  WBC 95.5* 95.5*  --   --  83.7* 99.8* 99.7*  HGB 9.6* 10.0* 10.5* 11.2* 9.8* 9.6* 9.8*  HCT 32.8* 32.0* 31.0* 33.0* 32.7* 33.2* 34.1*  MCV 99.1 96.7  --   --  97.9 99.4 100.3*  PLT 216 228  --   --  242 255 782    Basic Metabolic Panel: Recent Labs  Lab 06/09/19 0347 06/10/19 0436 06/11/19 0405 06/11/19 1411 06/11/19 1419 06/12/19 0523  06/13/19 0740  NA 137 138 138 134* 133* 139 140  K 5.2* 4.4 3.7 4.4 4.4 4.1 4.9  CL 103 100 103  --   --  104 107  CO2 23 25 26   --   --  24 24  GLUCOSE 150* 157* 134*  --   --  143* 127*  BUN 60* 60* 56*  --   --  49* 51*  CREATININE 1.64* 1.74* 1.78*  --   --  1.77* 1.58*  CALCIUM 9.3 8.9 8.9  --   --  9.1 9.0    GFR: Estimated Creatinine Clearance: 53.3 mL/min (A) (by C-G formula based on SCr of 1.58 mg/dL (H)).  Liver Function Tests: Recent Labs  Lab 06/09/19 0347 06/10/19 0436 06/11/19 0405 06/12/19 0523 06/13/19 0740  AST 14* 14* 15 13* 14*  ALT 24 24 19 18 18   ALKPHOS 49 50 46 44 46  BILITOT 0.8 0.7 0.9 0.6 0.4  PROT 6.3* 6.3* 6.1* 6.3* 6.3*  ALBUMIN 3.8 3.8 3.5 3.5 3.4*    Anemia Panel: Recent Labs    06/12/19 0523  FERRITIN 374*    Recent Results (from the past 240 hour(s))  MRSA PCR Screening     Status: None   Collection Time: 06/11/19  3:00 AM   Specimen: Nasopharyngeal  Result Value Ref Range Status   MRSA by PCR NEGATIVE NEGATIVE Final    Comment:        The GeneXpert MRSA Assay (FDA approved for NASAL specimens only), is one component of a comprehensive MRSA colonization surveillance program. It is not intended to diagnose MRSA infection nor to guide or monitor treatment for MRSA infections. Performed at [facility], [place] [address], [place], [place] [address]       Radiology Studies: No results found.     LOS: 12 days   Jaide Hillenburg [employer] on [email or web address]  06/14/2019, 10:48 AM

## 2019-06-14 NOTE — Progress Notes (Signed)
Spokw with wife Dorothea Ogle on patients status. All questions answered.

## 2019-06-14 NOTE — Progress Notes (Signed)
NAME:  [name] Ball, MRN:  [id number], DOB:  11-Nov-1951, LOS: 5 ADMISSION DATE:  05/23/2019, CONSULTATION DATE:  7/6 REFERRING MD: Elgergawy, CHIEF COMPLAINT:  Dyspnea   Brief History   68 y/o male with ARDS from COVID 19 pneumonia.   Past Medical History  CLL, chronic prednisone CKD COPD on home oxygen OSA on CPAP/BIPAP Diverticulitis Asthma Hypothyroid  Significant Hospital Events   June 27 admission July 6   Consults:  PCCM  Procedures:    Significant Diagnostic Tests:    Micro Data:    Antimicrobials:  Bactrim > chronic 6/27 Remdesivir > 7/1, restarted 7/6  7/7 Tocilizumab >  7/7 Enrolled in clinical trial [employer] study of Pamrevlumab  Interim history/subjective:  Slept on BIPAP Feels about the same today Oxygen needs unchanged  Objective   Blood pressure (!) 144/67, pulse 82, temperature 98.3 F (36.8 C), temperature source Oral, resp. rate (!) 28, height 5\' 7"  (1.702 m), weight 114.4 kg, SpO2 (!) 89 %.    Vent Mode: BIPAP FiO2 (%):  [80 %-100 %] 80 % Set Rate:  [8 bmp] 8 bmp PEEP:  [12 cmH20] 12 cmH20 Pressure Support:  [4 cmH20] 4 cmH20   Intake/Output Summary (Last 24 hours) at 06/14/2019 1053 Last data filed at 06/14/2019 0800 Gross per 24 hour  Intake 580 ml  Output 1240 ml  Net -660 ml   Filed Weights   05/07/2019 1309 05/18/2019 1949  Weight: 120.4 kg 114.4 kg    Examination:  General:  Resting comfortably in bed on BIPAP HENT: NCAT OP clear PULM: Poor air movement, few crackles B, normal effort CV: RRR, no mgr GI: BS+, soft, nontender MSK: normal bulk and tone Neuro: awake, alert, no distress, MAEW    Resolved Hospital Problem list     Assessment & Plan:  Severe Acute Respiratory Failure with hypoxemia due to COVID 19 Pneumonia Enrolled in fibrinogen study protocol, care per protocol Hold further remdesivir Continue Solu-Medrol 40 mg IV every 12 hours Administer O2 via heated high flow Tolerate periods of hypoxemia,  goal at rest is greater than 85% SaO2, with movement ideally above 75% Decision for intubation should be based on a change in mental status or physical evidence of ventilatory failure such as nasal flaring, accessory muscle use, paradoxical breathing Out of bed to chair as able Prone positioning while in bed if he can tolerate it  OSA BIPAP qHS  Acute diastolic heart failure: presumed based on physical exam findings; suspect this is chronic and not necessarily a new diagnosis Lasix today  Best practice:  Diet: full Pain/Anxiety/Delirium protocol (if indicated): none baseline VAP protocol (if indicated): n/a DVT prophylaxis: yes> sub q heparin per COVID ICU Protocol enhanced dosing GI prophylaxis: sub q heparin Glucose control: SSI Mobility: bedrest Code Status: full Family Communication: per [facility] Disposition: remain in ICU  Labs   CBC: Recent Labs  Lab 06/10/19 0436 06/11/19 1315 06/11/19 1411 06/11/19 1419 06/12/19 0523 06/13/19 0740 06/14/19 0555  WBC 95.5* 95.5*  --   --  83.7* 99.8* 99.7*  HGB 9.6* 10.0* 10.5* 11.2* 9.8* 9.6* 9.8*  HCT 32.8* 32.0* 31.0* 33.0* 32.7* 33.2* 34.1*  MCV 99.1 96.7  --   --  97.9 99.4 100.3*  PLT 216 228  --   --  242 255 165    Basic Metabolic Panel: Recent Labs  Lab 06/09/19 0347 06/10/19 0436 06/11/19 0405 06/11/19 1411 06/11/19 1419 06/12/19 0523 06/13/19 0740  NA 137 138 138 134* 133* 139 140  K 5.2* 4.4 3.7 4.4 4.4 4.1 4.9  CL 103 100 103  --   --  104 107  CO2 23 25 26   --   --  24 24  GLUCOSE 150* 157* 134*  --   --  143* 127*  BUN 60* 60* 56*  --   --  49* 51*  CREATININE 1.64* 1.74* 1.78*  --   --  1.77* 1.58*  CALCIUM 9.3 8.9 8.9  --   --  9.1 9.0   GFR: Estimated Creatinine Clearance: 53.3 mL/min (A) (by C-G formula based on SCr of 1.58 mg/dL (H)). Recent Labs  Lab 06/11/19 1315 06/12/19 0523 06/13/19 0740 06/14/19 0555  PROCALCITON 0.24 0.17 0.10  --   WBC 95.5* 83.7* 99.8* 99.7*    Liver Function  Tests: Recent Labs  Lab 06/09/19 0347 06/10/19 0436 06/11/19 0405 06/12/19 0523 06/13/19 0740  AST 14* 14* 15 13* 14*  ALT 24 24 19 18 18   ALKPHOS 49 50 46 44 46  BILITOT 0.8 0.7 0.9 0.6 0.4  PROT 6.3* 6.3* 6.1* 6.3* 6.3*  ALBUMIN 3.8 3.8 3.5 3.5 3.4*   No results for input(s): LIPASE, AMYLASE in the last 168 hours. No results for input(s): AMMONIA in the last 168 hours.  ABG    Component Value Date/Time   PHART 7.554 (H) 06/11/2019 1419   PCO2ART 25.7 (L) 06/11/2019 1419   PO2ART 31.0 (LL) 06/11/2019 1419   HCO3 22.7 06/11/2019 1419   TCO2 23 06/11/2019 1419   O2SAT 71.0 06/11/2019 1419     Coagulation Profile: No results for input(s): INR, PROTIME in the last 168 hours.  Cardiac Enzymes: No results for input(s): CKTOTAL, CKMB, CKMBINDEX, TROPONINI in the last 168 hours.  HbA1C: Hgb A1C (fingerstick)  Date/Time Value Ref Range Status  05/25/2016 09:01 AM 6.0 (H) <5.7 % Final    Comment:                                                                           According to the ADA Clinical Practice Recommendations for 2011, when HbA1c is used as a screening test:     >=6.5%   Diagnostic of Diabetes Mellitus            (if abnormal result is confirmed)   5.7-6.4%   Increased risk of developing Diabetes Mellitus   References:Diagnosis and Classification of Diabetes Mellitus,Diabetes EUMP,5361,44(RXVQM 1):S62-S69 and Standards of Medical Care in         Diabetes - 2011,Diabetes GQQP,6195,09 (Suppl 1):S11-S61.      Hgb A1c MFr Bld  Date/Time Value Ref Range Status  01/12/2019 12:01 PM 5.2 <5.7 % of total Hgb Final    Comment:    For the purpose of screening for the presence of diabetes: . <5.7%       Consistent with the absence of diabetes 5.7-6.4%    Consistent with increased risk for diabetes             (prediabetes) > or =6.5%  Consistent with diabetes . This assay result is consistent with a decreased risk of diabetes. . Currently, no consensus  exists regarding use of hemoglobin A1c for diagnosis of diabetes in children. . According to [employer]  Diabetes Association (ADA) guidelines, hemoglobin A1c <7.0% represents optimal control in non-pregnant diabetic patients. Different metrics may apply to specific patient populations.  Standards of Medical Care in Diabetes(ADA). Marland Kitchen   04/07/2018 11:31 AM <4.0 <5.7 % of total Hgb Final    Comment:    Verified by repeat analysis. . For the purpose of screening for the presence of diabetes: . <5.7%       Consistent with the absence of diabetes 5.7-6.4%    Consistent with increased risk for diabetes             (prediabetes) > or =6.5%  Consistent with diabetes . This assay result is consistent with a decreased risk of diabetes. . Currently, no consensus exists regarding use of hemoglobin A1c for diagnosis of diabetes in children. . According to American Diabetes Association (ADA) guidelines, hemoglobin A1c <7.0% represents optimal control in non-pregnant diabetic patients. Different metrics may apply to specific patient populations.  Standards of Medical Care in Diabetes(ADA). .     CBG: No results for input(s): GLUCAP in the last 168 hours.   Critical care time: 31 minutes    Roselie Awkward, MD [facility] Pager: [phone] Cell: [phone] If no response, call [phone]

## 2019-06-15 DIAGNOSIS — E875 Hyperkalemia: Secondary | ICD-10-CM

## 2019-06-15 LAB — BASIC METABOLIC PANEL
Anion gap: 12 (ref 5–15)
BUN: 69 mg/dL — ABNORMAL HIGH (ref 8–23)
CO2: 25 mmol/L (ref 22–32)
Calcium: 9.7 mg/dL (ref 8.9–10.3)
Chloride: 100 mmol/L (ref 98–111)
Creatinine, Ser: 1.82 mg/dL — ABNORMAL HIGH (ref 0.61–1.24)
GFR calc Af Amer: 43 mL/min — ABNORMAL LOW (ref >60)
GFR calc non Af Amer: 37 mL/min — ABNORMAL LOW (ref >60)
Glucose, Bld: 107 mg/dL — ABNORMAL HIGH (ref 70–99)
Potassium: 5.2 mmol/L — ABNORMAL HIGH (ref 3.5–5.1)
Sodium: 137 mmol/L (ref 135–145)

## 2019-06-15 LAB — COMPREHENSIVE METABOLIC PANEL
ALT: 22 U/L (ref 0–44)
AST: 30 U/L (ref 15–41)
Albumin: 3.7 g/dL (ref 3.5–5.0)
Alkaline Phosphatase: 44 U/L (ref 38–126)
Anion gap: 11 (ref 5–15)
BUN: 64 mg/dL — ABNORMAL HIGH (ref 8–23)
CO2: 22 mmol/L (ref 22–32)
Calcium: 9.3 mg/dL (ref 8.9–10.3)
Chloride: 105 mmol/L (ref 98–111)
Creatinine, Ser: 1.63 mg/dL — ABNORMAL HIGH (ref 0.61–1.24)
GFR calc Af Amer: 49 mL/min — ABNORMAL LOW (ref >60)
GFR calc non Af Amer: 42 mL/min — ABNORMAL LOW (ref >60)
Glucose, Bld: 146 mg/dL — ABNORMAL HIGH (ref 70–99)
Potassium: 5.7 mmol/L — ABNORMAL HIGH (ref 3.5–5.1)
Sodium: 138 mmol/L (ref 135–145)
Total Bilirubin: 0.7 mg/dL (ref 0.3–1.2)
Total Protein: 6.3 g/dL — ABNORMAL LOW (ref 6.5–8.1)

## 2019-06-15 LAB — CBC
HCT: 35.6 % — ABNORMAL LOW (ref 39.0–52.0)
Hemoglobin: 10.6 g/dL — ABNORMAL LOW (ref 13.0–17.0)
MCH: 29.4 pg (ref 26.0–34.0)
MCHC: 29.8 g/dL — ABNORMAL LOW (ref 30.0–36.0)
MCV: 98.9 fL (ref 80.0–100.0)
Platelets: 255 10*3/uL (ref 150–400)
RBC: 3.6 MIL/uL — ABNORMAL LOW (ref 4.22–5.81)
RDW: 14.9 % (ref 11.5–15.5)
WBC: 101.3 10*3/uL (ref 4.0–10.5)
nRBC: 0 % (ref 0.0–0.2)

## 2019-06-15 LAB — C-REACTIVE PROTEIN: CRP: 1.2 mg/dL — ABNORMAL HIGH (ref <1.0)

## 2019-06-15 LAB — D-DIMER, QUANTITATIVE: D-Dimer, Quant: <0.27 ug/mL-FEU (ref 0.00–0.50)

## 2019-06-15 MED ORDER — SODIUM ZIRCONIUM CYCLOSILICATE 10 G PO PACK
10.0000 g | PACK | Freq: Once | ORAL | Status: AC
  Administered 2019-06-15: 10 g via ORAL
  Filled 2019-06-15: qty 1

## 2019-06-15 MED ORDER — FUROSEMIDE 10 MG/ML IJ SOLN
40.0000 mg | Freq: Once | INTRAMUSCULAR | Status: AC
  Administered 2019-06-15: 40 mg via INTRAVENOUS
  Filled 2019-06-15: qty 4

## 2019-06-15 NOTE — Plan of Care (Signed)
  Problem: Respiratory: Goal: Will maintain a patent airway 06/15/2019 0310 by Paulina Fusi, RN Outcome: Progressing 06/15/2019 0310 by Paulina Fusi, RN Outcome: Progressing

## 2019-06-15 NOTE — Progress Notes (Signed)
NAME:  [name] Ball, MRN:  [id number], DOB:  09-22-1951, LOS: 51 ADMISSION DATE:  05/24/2019, CONSULTATION DATE:  7/6 REFERRING MD: Elgergawy, CHIEF COMPLAINT:  Dyspnea   Brief History   68 y/o male with ARDS from COVID 19 pneumonia.  Past Medical History  CLL, chronic prednisone CKD COPD on home oxygen OSA on CPAP/BIPAP Diverticulitis Asthma Hypothyroid  Significant Hospital Events   June 27 admission July 6   Consults:  PCCM  Procedures:    Significant Diagnostic Tests:    Micro Data:    Antimicrobials:  Bactrim > chronic 6/27 Remdesivir > 7/1, restarted 7/6  7/7 Tocilizumab >  7/7 Enrolled in clinical trial [employer] study of Pamrevlumab  Interim history/subjective:   Slept on BiPAP again, says he slept well He says he feels great this morning, shortness of breath is improving, less burning in his chest He remains on high concentration of oxygen and high flow of oxygen Diuresed well  Heated high flow settings this morning 45 Lpm, 100% FiO2  Objective   Blood pressure 138/70, pulse 83, temperature (!) 97.4 F (36.3 C), temperature source Axillary, resp. rate (!) 23, height 5\' 7"  (1.702 m), weight 114.4 kg, SpO2 93 %.    Vent Mode: BIPAP FiO2 (%):  [70 %-100 %] 70 % Set Rate:  [8 bmp] 8 bmp PEEP:  [8 cmH20] 8 cmH20   Intake/Output Summary (Last 24 hours) at 06/15/2019 0804 Last data filed at 06/15/2019 0600 Gross per 24 hour  Intake -  Output 1930 ml  Net -1930 ml   Filed Weights   06/01/2019 1309 05/23/2019 1949  Weight: 120.4 kg 114.4 kg    Examination:  General:  Resting comfortably in bed HENT: NCAT OP clear PULM: CTA B, normal effort CV: RRR, no mgr GI: BS+, soft, nontender MSK: normal bulk and tone Neuro: awake, alert, no distress, MAEW    Resolved Hospital Problem list     Assessment & Plan:  Severe Acute Respiratory Failure with hypoxemia due to COVID 19 Pneumonia Prognosis guarded, if he survives this will take weeks  Continue study protocol per fibrogen protocol Continue solumedrol Hold further remdesivir Tolerate periods of hypoxemia, goal at rest is greater than 85% SaO2, with movement ideally above 75% Decision for intubation should be based on a change in mental status or physical evidence of ventilatory failure such as nasal flaring, accessory muscle use, paradoxical breathing Out of bed to chair as able Prone positioning while in bed Continue oxygen via heated high flow, weaning as able  OSA BIPAP qHS  Acute diastolic heart failure: presumed based on physical exam findings; suspect this is chronic and not necessarily a new diagnosis Continue lasix again today  Best practice:  Diet: full Pain/Anxiety/Delirium protocol (if indicated): none baseline VAP protocol (if indicated): n/a DVT prophylaxis: yes> sub q heparin per COVID ICU Protocol enhanced dosing GI prophylaxis: sub q heparin Glucose control: SSI Mobility: bedrest Code Status: full Family Communication: per [facility] Disposition: remain in ICU  Labs   CBC: Recent Labs  Lab 06/10/19 0436 06/11/19 1315 06/11/19 1411 06/11/19 1419 06/12/19 0523 06/13/19 0740 06/14/19 0555  WBC 95.5* 95.5*  --   --  83.7* 99.8* 99.7*  HGB 9.6* 10.0* 10.5* 11.2* 9.8* 9.6* 9.8*  HCT 32.8* 32.0* 31.0* 33.0* 32.7* 33.2* 34.1*  MCV 99.1 96.7  --   --  97.9 99.4 100.3*  PLT 216 228  --   --  242 255 086    Basic Metabolic Panel: Recent Labs  Lab 06/09/19 0347 06/10/19 0436 06/11/19 0405 06/11/19 1411 06/11/19 1419 06/12/19 0523 06/13/19 0740  NA 137 138 138 134* 133* 139 140  K 5.2* 4.4 3.7 4.4 4.4 4.1 4.9  CL 103 100 103  --   --  104 107  CO2 23 25 26   --   --  24 24  GLUCOSE 150* 157* 134*  --   --  143* 127*  BUN 60* 60* 56*  --   --  49* 51*  CREATININE 1.64* 1.74* 1.78*  --   --  1.77* 1.58*  CALCIUM 9.3 8.9 8.9  --   --  9.1 9.0   GFR: Estimated Creatinine Clearance: 53.3 mL/min (A) (by C-G formula based on SCr of 1.58 mg/dL  (H)). Recent Labs  Lab 06/11/19 1315 06/12/19 0523 06/13/19 0740 06/14/19 0555  PROCALCITON 0.24 0.17 0.10  --   WBC 95.5* 83.7* 99.8* 99.7*    Liver Function Tests: Recent Labs  Lab 06/09/19 0347 06/10/19 0436 06/11/19 0405 06/12/19 0523 06/13/19 0740  AST 14* 14* 15 13* 14*  ALT 24 24 19 18 18   ALKPHOS 49 50 46 44 46  BILITOT 0.8 0.7 0.9 0.6 0.4  PROT 6.3* 6.3* 6.1* 6.3* 6.3*  ALBUMIN 3.8 3.8 3.5 3.5 3.4*   No results for input(s): LIPASE, AMYLASE in the last 168 hours. No results for input(s): AMMONIA in the last 168 hours.  ABG    Component Value Date/Time   PHART 7.554 (H) 06/11/2019 1419   PCO2ART 25.7 (L) 06/11/2019 1419   PO2ART 31.0 (LL) 06/11/2019 1419   HCO3 22.7 06/11/2019 1419   TCO2 23 06/11/2019 1419   O2SAT 71.0 06/11/2019 1419     Coagulation Profile: No results for input(s): INR, PROTIME in the last 168 hours.  Cardiac Enzymes: No results for input(s): CKTOTAL, CKMB, CKMBINDEX, TROPONINI in the last 168 hours.  HbA1C: Hgb A1C (fingerstick)  Date/Time Value Ref Range Status  05/25/2016 09:01 AM 6.0 (H) <5.7 % Final    Comment:                                                                           According to the ADA Clinical Practice Recommendations for 2011, when HbA1c is used as a screening test:     >=6.5%   Diagnostic of Diabetes Mellitus            (if abnormal result is confirmed)   5.7-6.4%   Increased risk of developing Diabetes Mellitus   References:Diagnosis and Classification of Diabetes Mellitus,Diabetes MLYY,5035,46(FKCLE 1):S62-S69 and Standards of Medical Care in         Diabetes - 2011,Diabetes XNTZ,0017,49 (Suppl 1):S11-S61.      Hgb A1c MFr Bld  Date/Time Value Ref Range Status  01/12/2019 12:01 PM 5.2 <5.7 % of total Hgb Final    Comment:    For the purpose of screening for the presence of diabetes: . <5.7%       Consistent with the absence of diabetes 5.7-6.4%    Consistent with increased risk for  diabetes             (prediabetes) > or =6.5%  Consistent with diabetes . This assay result is consistent with  a decreased risk of diabetes. . Currently, no consensus exists regarding use of hemoglobin A1c for diagnosis of diabetes in children. . According to American Diabetes Association (ADA) guidelines, hemoglobin A1c <7.0% represents optimal control in non-pregnant diabetic patients. Different metrics may apply to specific patient populations.  Standards of Medical Care in Diabetes(ADA). Marland Kitchen   04/07/2018 11:31 AM <4.0 <5.7 % of total Hgb Final    Comment:    Verified by repeat analysis. . For the purpose of screening for the presence of diabetes: . <5.7%       Consistent with the absence of diabetes 5.7-6.4%    Consistent with increased risk for diabetes             (prediabetes) > or =6.5%  Consistent with diabetes . This assay result is consistent with a decreased risk of diabetes. . Currently, no consensus exists regarding use of hemoglobin A1c for diagnosis of diabetes in children. . According to American Diabetes Association (ADA) guidelines, hemoglobin A1c <7.0% represents optimal control in non-pregnant diabetic patients. Different metrics may apply to specific patient populations.  Standards of Medical Care in Diabetes(ADA). .     CBG: No results for input(s): GLUCAP in the last 168 hours.   Critical care time: 30 minutes    Roselie Awkward, MD [facility] Pager: [phone] Cell: [phone] If no response, call [phone]

## 2019-06-15 NOTE — Progress Notes (Signed)
PROGRESS NOTE  [name] Ball:[id number] DOB: September 19, 1951 DOA: 05/24/2019  PCP: Alycia Rossetti, MD  Brief History/Interval Summary: Patient is a 68 y.o. male with PMHx of CLL, HTN, Hypothyroidism, hx of CVA, GERD, COPD/asthma, CKD stage 3 and severe hearing impared/deafness-presented to the hospital with SOB-found to have acute hypoxic resp failure secondary to COVID 19 PNA.  Patient was treated with Remdesivir and IV steroids with improvement-unfortunately on 7/6-patient's hypoxemia worsened-requiring 15 L high flow oxygen-and was subsequently transferred to the ICU.  Reason for Visit: Acute respiratory disease due to COVID-19  Consultants: Pulmonology  Procedures: None  Antibiotics: Anti-infectives (From admission, onward)   Start     Dose/Rate Route Frequency Ordered Stop   06/11/19 1830  remdesivir 100 mg in sodium chloride 0.9 % 250 mL IVPB  Status:  Discontinued     100 mg 500 mL/hr over 30 Minutes Intravenous Every 24 hours 06/11/19 1710 06/13/19 1131   06/04/19 1000  sulfamethoxazole-trimethoprim (BACTRIM DS) 800-160 MG per tablet 1 tablet     1 tablet Oral Every M-W-F 05/11/2019 1551     06/03/19 2100  remdesivir 100 mg in sodium chloride 0.9 % 250 mL IVPB     100 mg 500 mL/hr over 30 Minutes Intravenous Every 24 hours 05/14/2019 1956 06/06/19 2143   05/10/2019 2100  remdesivir 200 mg in sodium chloride 0.9 % 250 mL IVPB     200 mg 500 mL/hr over 30 Minutes Intravenous Once 06/03/2019 1956 05/26/2019 2315   05/28/2019 1430  vancomycin (VANCOCIN) IVPB 1000 mg/200 mL premix     1,000 mg 200 mL/hr over 60 Minutes Intravenous  Once 05/22/2019 1422 05/26/2019 1547   05/31/2019 1430  piperacillin-tazobactam (ZOSYN) IVPB 3.375 g     3.375 g 100 mL/hr over 30 Minutes Intravenous  Once 05/27/2019 1422 05/08/2019 1514       Subjective/Interval History: Patient states that he feels better.  He is still requiring a lot of oxygen however.  Cough seems to be better.  No chest pain.  Slept well  overnight.      Assessment/Plan:  Acute Hypoxic Resp. Failure due to Acute Covid 19 Viral Illness  COVID-19 Labs  Recent Labs    06/13/19 0740 06/14/19 0555 06/15/19 0606  DDIMER <0.27 <0.27 <0.27  CRP 9.4* 3.6* 1.2*    Lab Results  Component Value Date   SARSCOV2NAA Detected (A) 05/28/2019   Patient was initially hospitalized and placed on Remdesivir and steroids.  He did show improvement.  However patient acutely decompensated on 7/6.  He was transferred to the intensive care unit and placed on BiPAP.  Patient's respiratory status is tenuous but stable.  He is still on 100% FiO2 at 50 L/min by high flow nasal cannula.  He has completed a course of Remdesivir.  He remains on steroids.  He was given Actemra on 7/7 and 7/8.  His CRP has improved to 1.2.  D-dimer less than 0.27.  Done yesterday which did not show any significant changes compared to the one from 7/6.  Patient was given a dose of Lasix yesterday with which he did diurese.  He will be given an additional dose today.  Prone position as much as possible.  Incentive spirometry.  Continue antitussive agents.  Patient has been enrolled into the fibrogen research study as well.  COVID-19 medications: 7/8>> Actemra 7/7>> Actemra 7/7>> Fibrogen trial - 4401-027 - CTGF MAb v Placebo - Blinded (planned) (Consented by Dr Chase Caller) 6/27>> Solumedrol 6/27>>Remdesivir   History  of CLL WBC count remains high as is his baseline.  Patient is on chronic steroids (prednisolone 5 mg daily).  Also is on prophylactic Bactrim.  Follow-up with oncology after discharge.  History of chronic kidney disease stage III Renal function about baseline.  Potassium level noted to be elevated this morning at 5.7.  We will recheck it later today.  Good urine output from Lasix.  Normocytic anemia No evidence of overt bleeding.  Hemoglobin has been stable.    Hyperkalemia Potassium level again noted to be elevated this morning.  He has required  dose several doses of Lokelma previously.  Potassium was normal the last several days.  We will give additional dose of Lokelma today.  Continue to monitor closely as patient is on Bactrim.  History of hypothyroidism Continue Synthroid.  History of COPD/asthma Stable.  Continue bronchodilators.  History of CVA Continue Plavix.  Stable.  History of obstructive sleep apnea Stable.  Morbid obesity BMI is 40.37  Essential hypertension Monitor blood pressures closely.    History of GERD Continue PPI  History of autoimmune hemolytic anemia related to CLL Hemoglobin remains stable.    History of depression Continue Celexa.  Significant hearing impairment/deafness  Jehovah's witness Patient refuses all blood products including convalescent plasma.  Goals of care Patient remains stable but tenuous.  He feels better but his oxygen levels remain low.  He is still requiring high FiO2's.  Give additional dose of Lasix today as discussed above.  Continue DNR status.    DVT Prophylaxis: Heparin subcutaneously PUD Prophylaxis: Protonix Code Status: DNR Family Communication: Updating his wife on a daily basis. Disposition Plan: Remain in ICU for now   Medications:  Scheduled: . benzonatate  100 mg Oral TID  . chlorhexidine  15 mL Mouth Rinse BID  . Chlorhexidine Gluconate Cloth  6 each Topical Daily  . citalopram  10 mg Oral Daily  . clopidogrel  75 mg Oral Daily  . fluticasone  2 puff Inhalation BID  . folic acid  1 mg Oral Daily  . heparin  7,500 Units Subcutaneous Q8H  . levothyroxine  50 mcg Oral Q0600  . mouth rinse  15 mL Mouth Rinse q12n4p  . methylPREDNISolone (SOLU-MEDROL) injection  40 mg Intravenous Q12H  . metoprolol tartrate  12.5 mg Oral BID  . pantoprazole  40 mg Oral Daily  . [START ON 06/19/2019] pamrevlumab or placebo  35 mg/kg (Order-Specific) Intravenous Q7 days   Followed by  . [START ON 07/03/2019] pamrevlumab or placebo  35 mg/kg (Order-Specific)  Intravenous Once  . sulfamethoxazole-trimethoprim  1 tablet Oral Q M,W,F   Continuous:  ZOX:[id number], chlorpheniramine-HYDROcodone, diphenhydrAMINE, guaiFENesin-dextromethorphan, HYDROcodone-acetaminophen, ipratropium-albuterol, ondansetron **OR** ondansetron (ZOFRAN) IV   Objective:  Vital Signs  Vitals:   06/15/19 0700 06/15/19 0740 06/15/19 0900 06/15/19 1000  BP: 138/70 138/70 (!) 151/63 (!) 153/120  Pulse: 83 80 94 94  Resp: (!) 23 (!) 30 (!) 32 (!) 22  Temp:      TempSrc:      SpO2: 93% 93% (!) 83% (!) 87%  Weight:      Height:        Intake/Output Summary (Last 24 hours) at 06/15/2019 1038 Last data filed at 06/15/2019 1000 Gross per 24 hour  Intake -  Output 2080 ml  Net -2080 ml   Filed Weights   05/08/2019 1309 05/27/2019 1949  Weight: 120.4 kg 114.4 kg    General appearance: Awake alert.  In no distress.  Hard of hearing Resp: Mildly  tachypneic at rest.  Coarse breath sounds bilaterally with crackles at the bases.  No wheezing or rhonchi.   Cardio: S1-S2 is normal regular.  No S3-S4.  No rubs murmurs or bruit GI: Abdomen is soft.  Nontender nondistended.  Bowel sounds are present normal.  No masses organomegaly Extremities: Minimal edema bilateral lower extremities Neurologic: Hard of hearing.  No obvious focal neurological deficits.     Lab Results:  Data Reviewed: I have personally reviewed following labs and imaging studies  CBC: Recent Labs  Lab 06/11/19 1315  06/11/19 1419 06/12/19 0523 06/13/19 0740 06/14/19 0555 06/15/19 0606  WBC 95.5*  --   --  83.7* 99.8* 99.7* 101.3*  HGB 10.0*   < > 11.2* 9.8* 9.6* 9.8* 10.6*  HCT 32.0*   < > 33.0* 32.7* 33.2* 34.1* 35.6*  MCV 96.7  --   --  97.9 99.4 100.3* 98.9  PLT 228  --   --  242 255 264 255   < > = values in this interval not displayed.    Basic Metabolic Panel: Recent Labs  Lab 06/10/19 0436 06/11/19 0405 06/11/19 1411 06/11/19 1419 06/12/19 0523 06/13/19 0740 06/15/19 0606  NA  138 138 134* 133* 139 140 138  K 4.4 3.7 4.4 4.4 4.1 4.9 5.7*  CL 100 103  --   --  104 107 105  CO2 25 26  --   --  24 24 22   GLUCOSE 157* 134*  --   --  143* 127* 146*  BUN 60* 56*  --   --  49* 51* 64*  CREATININE 1.74* 1.78*  --   --  1.77* 1.58* 1.63*  CALCIUM 8.9 8.9  --   --  9.1 9.0 9.3    GFR: Estimated Creatinine Clearance: 51.7 mL/min (A) (by C-G formula based on SCr of 1.63 mg/dL (H)).  Liver Function Tests: Recent Labs  Lab 06/10/19 0436 06/11/19 0405 06/12/19 0523 06/13/19 0740 06/15/19 0606  AST 14* 15 13* 14* 30  ALT 24 19 18 18 22   ALKPHOS 50 46 44 46 44  BILITOT 0.7 0.9 0.6 0.4 0.7  PROT 6.3* 6.1* 6.3* 6.3* 6.3*  ALBUMIN 3.8 3.5 3.5 3.4* 3.7     Recent Results (from the past 240 hour(s))  MRSA PCR Screening     Status: None   Collection Time: 06/11/19  3:00 AM   Specimen: Nasopharyngeal  Result Value Ref Range Status   MRSA by PCR NEGATIVE NEGATIVE Final    Comment:        The GeneXpert MRSA Assay (FDA approved for NASAL specimens only), is one component of a comprehensive MRSA colonization surveillance program. It is not intended to diagnose MRSA infection nor to guide or monitor treatment for MRSA infections. Performed at [facility], [place] [address] [place], [place] [address]       Radiology Studies: Dg Chest Port 1 View  Result Date: 06/14/2019 CLINICAL DATA:  Acute respiratory failure with hypoxemia secondary to COVID-19 pneumonia. EXAM: PORTABLE CHEST 1 VIEW COMPARISON:  Single-view of the chest 06/11/2019 and 06/09/2019 FINDINGS: Left worse than right airspace disease persists without notable change since the most recent examination. Heart size is upper normal. No pneumothorax. There may be a small left pleural effusion. No acute bony abnormality. IMPRESSION: No marked change in left worse than right airspace disease. Electronically Signed   By: Inge Rise M.D.   On: 06/14/2019 11:38       LOS: 13 days  [name] Ball  [employer] operational officer on [email or web address]  06/15/2019, 10:38 AM

## 2019-06-15 NOTE — Progress Notes (Signed)
Occupational Therapy Treatment Patient Details Name: [name] Ball MRN: [id number] DOB: 02-11-1951 Today's Date: 06/15/2019    History of present illness Patient is a 68 y.o. male with PMHx of CLL, HTN, Hypothyroidism, hx of CVA(left thalamic infarct), GERD, COPD/asthma, CKD stage 3 and severe hearing impared/deafness-presented to the hospital with SOB-found to have acute hypoxic resp failure secondary to COVID 19 PNA. Transferred back to ICU 7/6.    OT comments  Pt seen on 45L 90%FiO2. Session limited to bed level due to SpO2 high 70s with activity. Rebound to mid 80s. Pt states he feels his breathing is "better". Session focused on engaging pt in bed level grooming tasks, bed mobility and educating pt on theraband exercises to complete on his own over the weekend. [employer] ASL interpreter Erline Levine [id number]) used during session. Pt joking with therapist thorughout session.  Pt left in chair position in bed and stated it felt good to "sit up". Will continue ot follow acutely and progress as pt tolerates.   Follow Up Recommendations  Home health OT;Supervision/Assistance - 24 hour    Equipment Recommendations  3 in 1 bedside commode    Recommendations for Other Services      Precautions / Restrictions Precautions Precautions: Fall Precaution Comments: Watch stats       Mobility Bed Mobility Overal bed mobility: Needs Assistance Bed Mobility: Rolling Rolling: Modified independent (Device/Increase time)(heavy use of bed rails)         General bed mobility comments: Left sitting upright in bed  Transfers                 General transfer comment: not attempted today due to SpO2 in 70s - 80s on 50L    Balance     Sitting balance-Leahy Scale: Good                                     ADL either performed or assessed with clinical judgement   ADL Overall ADL's : Needs assistance/impaired Eating/Feeding: Modified independent   Grooming: Set up;Sitting    Upper Body Bathing: Set up;Sitting   Lower Body Bathing: Maximal assistance;Bed level   Upper Body Dressing : Minimal assistance;Sitting   Lower Body Dressing: Maximal assistance;Bed level                 General ADL Comments: Pt limited with mobility due to increased O2 requirement and low Spo2     Vision       Perception     Praxis      Cognition Arousal/Alertness: Awake/alert Behavior During Therapy: WFL for tasks assessed/performed Overall Cognitive Status: Within Functional Limits for tasks assessed                                          Exercises Exercises: General Upper Extremity General Exercises - Upper Extremity Shoulder Flexion: 10 reps;Seated;Theraband;Strengthening;Both Theraband Level (Shoulder Flexion): Level 2 (Red) Shoulder Horizontal ABduction: Strengthening;Both;10 reps;Seated;Theraband Theraband Level (Shoulder Horizontal Abduction): Level 2 (Red) Elbow Flexion: Strengthening;Both;10 reps;Seated;Theraband Theraband Level (Elbow Flexion): Level 2 (Red) Elbow Extension: Strengthening;Both;10 reps;Seated;Theraband Theraband Level (Elbow Extension): Level 2 (Red) Digit Composite Flexion: Squeeze ball;15 reps;Both   Shoulder Instructions       General Comments Pt joking with this therapist throughout session    Pertinent Vitals/ Pain  Pain Assessment: Faces Faces Pain Scale: Hurts little more Pain Location: Chest with increased coughing Pain Descriptors / Indicators: Discomfort Pain Intervention(s): Limited activity within patient's tolerance  Home Living                                          Prior Functioning/Environment              Frequency  Min 3X/week        Progress Toward Goals  OT Goals(current goals can now be found in the care plan section)  Progress towards OT goals: Progressing toward goals;OT to reassess next treatment(although decline due to respiratory  status)  Acute Rehab OT Goals Patient Stated Goal: to go home OT Goal Formulation: With patient Time For Goal Achievement: 06/18/19 Potential to Achieve Goals: Good ADL Goals Pt Will Perform Grooming: with supervision;standing Pt Will Perform Upper Body Dressing: with modified independence;sitting Pt Will Perform Lower Body Dressing: with min guard assist;sit to/from stand Pt Will Transfer to Toilet: with supervision;ambulating Pt Will Perform Toileting - Clothing Manipulation and hygiene: with modified independence;sit to/from stand Pt/caregiver will Perform Home Exercise Program: Both right and left upper extremity;With theraband;Independently;With written HEP provided Additional ADL Goal #1: Pt will recall and implement 3 energy conservation techniques during ADL routine at independent level  Plan Discharge plan remains appropriate    Co-evaluation                 AM-PAC OT "6 Clicks" Daily Activity     Outcome Measure   Help from another person eating meals?: None Help from another person taking care of personal grooming?: A Little Help from another person toileting, which includes using toliet, bedpan, or urinal?: A Lot Help from another person bathing (including washing, rinsing, drying)?: A Lot Help from another person to put on and taking off regular upper body clothing?: A Little Help from another person to put on and taking off regular lower body clothing?: A Lot 6 Click Score: 16    End of Session Equipment Utilized During Treatment: Oxygen(50L 100% FiO2)  OT Visit Diagnosis: Unsteadiness on feet (R26.81);Muscle weakness (generalized) (M62.81);Pain Pain - part of body: (chest)   Activity Tolerance Patient tolerated treatment well   Patient Left in bed;with call bell/phone within reach(chair position)   Nurse Communication Mobility status        Time: 1010-1040 OT Time Calculation (min): 30 min  Charges: OT General Charges $OT Visit: 1 Visit OT  Treatments $Self Care/Home Management : 8-22 mins $Therapeutic Exercise: 8-22 mins  Maurie Boettcher, OT/L   Acute OT Clinical Specialist Acute Rehabilitation Services Pager [phone] Office [phone]    [facility] 06/15/2019, 1:06 PM

## 2019-06-16 LAB — COMPREHENSIVE METABOLIC PANEL
ALT: 22 U/L (ref 0–44)
AST: 20 U/L (ref 15–41)
Albumin: 4.1 g/dL (ref 3.5–5.0)
Alkaline Phosphatase: 50 U/L (ref 38–126)
Anion gap: 12 (ref 5–15)
BUN: 76 mg/dL — ABNORMAL HIGH (ref 8–23)
CO2: 25 mmol/L (ref 22–32)
Calcium: 9.4 mg/dL (ref 8.9–10.3)
Chloride: 101 mmol/L (ref 98–111)
Creatinine, Ser: 2.03 mg/dL — ABNORMAL HIGH (ref 0.61–1.24)
GFR calc Af Amer: 38 mL/min — ABNORMAL LOW (ref >60)
GFR calc non Af Amer: 32 mL/min — ABNORMAL LOW (ref >60)
Glucose, Bld: 131 mg/dL — ABNORMAL HIGH (ref 70–99)
Potassium: 5.3 mmol/L — ABNORMAL HIGH (ref 3.5–5.1)
Sodium: 138 mmol/L (ref 135–145)
Total Bilirubin: 0.5 mg/dL (ref 0.3–1.2)
Total Protein: 6.9 g/dL (ref 6.5–8.1)

## 2019-06-16 LAB — CBC
HCT: 41 % (ref 39.0–52.0)
Hemoglobin: 11.7 g/dL — ABNORMAL LOW (ref 13.0–17.0)
MCH: 28.4 pg (ref 26.0–34.0)
MCHC: 28.5 g/dL — ABNORMAL LOW (ref 30.0–36.0)
MCV: 99.5 fL (ref 80.0–100.0)
Platelets: 311 10*3/uL (ref 150–400)
RBC: 4.12 MIL/uL — ABNORMAL LOW (ref 4.22–5.81)
RDW: 15.2 % (ref 11.5–15.5)
WBC: 132.6 10*3/uL (ref 4.0–10.5)
nRBC: 0 % (ref 0.0–0.2)

## 2019-06-16 LAB — C-REACTIVE PROTEIN: CRP: <0.8 mg/dL (ref <1.0)

## 2019-06-16 LAB — D-DIMER, QUANTITATIVE: D-Dimer, Quant: <0.27 ug/mL-FEU (ref 0.00–0.50)

## 2019-06-16 MED ORDER — MORPHINE 100MG IN NS 100ML (1MG/ML) PREMIX INFUSION
0.0000 mg/h | INTRAVENOUS | Status: DC
  Administered 2019-06-16: 5 mg/h via INTRAVENOUS
  Administered 2019-06-16: 12 mg/h via INTRAVENOUS
  Filled 2019-06-16 (×2): qty 100

## 2019-06-16 MED ORDER — DEXTROSE 5 % IV SOLN
INTRAVENOUS | Status: DC
  Administered 2019-06-16: 15:00:00 via INTRAVENOUS

## 2019-06-16 MED ORDER — GLYCOPYRROLATE 0.2 MG/ML IJ SOLN: 0.2000 mg | INTRAMUSCULAR | Status: DC | PRN

## 2019-06-16 MED ORDER — DIPHENHYDRAMINE HCL 50 MG/ML IJ SOLN: 25.0000 mg | INTRAMUSCULAR | Status: DC | PRN

## 2019-06-16 MED ORDER — ACETAMINOPHEN 325 MG PO TABS: 650.0000 mg | ORAL_TABLET | Freq: Four times a day (QID) | ORAL | Status: DC | PRN

## 2019-06-16 MED ORDER — POLYVINYL ALCOHOL 1.4 % OP SOLN
1.0000 [drp] | Freq: Four times a day (QID) | OPHTHALMIC | Status: DC | PRN
  Filled 2019-06-16: qty 15

## 2019-06-16 MED ORDER — MORPHINE SULFATE (PF) 2 MG/ML IV SOLN: 2.0000 mg | INTRAVENOUS | Status: DC | PRN

## 2019-06-16 MED ORDER — MORPHINE SULFATE (PF) 2 MG/ML IV SOLN
2.0000 mg | INTRAVENOUS | Status: DC | PRN
  Filled 2019-06-16: qty 1

## 2019-06-16 MED ORDER — ATOVAQUONE 750 MG/5ML PO SUSP: 1500.0000 mg | Freq: Every day | ORAL | Status: DC

## 2019-06-16 MED ORDER — SODIUM ZIRCONIUM CYCLOSILICATE 10 G PO PACK
10.0000 g | PACK | Freq: Once | ORAL | Status: DC
  Filled 2019-06-16: qty 1

## 2019-06-16 MED ORDER — MORPHINE BOLUS VIA INFUSION
5.0000 mg | INTRAVENOUS | Status: DC | PRN
  Filled 2019-06-16: qty 5

## 2019-06-16 MED ORDER — ACETAMINOPHEN 650 MG RE SUPP: 650.0000 mg | Freq: Four times a day (QID) | RECTAL | Status: DC | PRN

## 2019-06-16 MED ORDER — GLYCOPYRROLATE 1 MG PO TABS
1.0000 mg | ORAL_TABLET | ORAL | Status: DC | PRN
  Filled 2019-06-16: qty 1

## 2019-06-16 NOTE — Progress Notes (Signed)
CCM made aware that patient's oxygen saturations were in the 50s and that patient was refusing BiPAP. End of life plan of care was discussed with patient, wife and daughter. Charge RN called wife to set up a visit.   Patient was assisted to first floor RM 172 to meet with wife and daughter by RN and RT. All questions answered. Patient's wedding ring and phone was cleaned with purple wipes and given to wife.   Patient still has a cane, shoes, dentures and clothing in his ICU room.

## 2019-06-16 NOTE — Progress Notes (Addendum)
I spoke with Zigmund Daniel, patient's wife, over the phone. She was updated on patient's current disposition. Zigmund Daniel declined the offer to FaceTime patient at this time. She feared it might make the patient more sad.

## 2019-06-16 NOTE — Care Management (Signed)
Case manager has contacted Magda Paganini with [employer] to have oxygen concentrator and tanks picked up from patient's home. . May He transition peacefully.

## 2019-06-16 NOTE — Progress Notes (Signed)
Morphine drip initiated once patient arrived back to ICU unit. Patient states he is comfortable at 10mg /hr of Morphine Sulfate. Will continue to monitor.

## 2019-06-16 NOTE — Progress Notes (Addendum)
PROGRESS NOTE  [name] Ball HUD:[id number] DOB: 02/13/1951 DOA: 05/16/2019  PCP: Alycia Rossetti, MD  Brief History/Interval Summary: Patient is a 68 y.o. male with PMHx of CLL, HTN, Hypothyroidism, hx of CVA, GERD, COPD/asthma, CKD stage 3 and severe hearing impared/deafness-presented to the hospital with SOB-found to have acute hypoxic resp failure secondary to COVID 19 PNA.  Patient was treated with Remdesivir and IV steroids with improvement-unfortunately on 7/6-patient's hypoxemia worsened-requiring 15 L high flow oxygen-and was subsequently transferred to the ICU.  Reason for Visit: Acute respiratory disease due to COVID-19  Consultants: Pulmonology  Procedures: None  Antibiotics: Anti-infectives (From admission, onward)   Start     Dose/Rate Route Frequency Ordered Stop   06/11/19 1830  remdesivir 100 mg in sodium chloride 0.9 % 250 mL IVPB  Status:  Discontinued     100 mg 500 mL/hr over 30 Minutes Intravenous Every 24 hours 06/11/19 1710 06/13/19 1131   06/04/19 1000  sulfamethoxazole-trimethoprim (BACTRIM DS) 800-160 MG per tablet 1 tablet     1 tablet Oral Every M-W-F 05/07/2019 1551     06/03/19 2100  remdesivir 100 mg in sodium chloride 0.9 % 250 mL IVPB     100 mg 500 mL/hr over 30 Minutes Intravenous Every 24 hours 05/09/2019 1956 06/06/19 2143   05/30/2019 2100  remdesivir 200 mg in sodium chloride 0.9 % 250 mL IVPB     200 mg 500 mL/hr over 30 Minutes Intravenous Once 05/30/2019 1956 05/28/2019 2315   05/12/2019 1430  vancomycin (VANCOCIN) IVPB 1000 mg/200 mL premix     1,000 mg 200 mL/hr over 60 Minutes Intravenous  Once 05/20/2019 1422 05/21/2019 1547   05/11/2019 1430  piperacillin-tazobactam (ZOSYN) IVPB 3.375 g     3.375 g 100 mL/hr over 30 Minutes Intravenous  Once 05/12/2019 1422 05/14/2019 1514       Subjective/Interval History: Patient is extremely hard of hearing.  Difficult to communicate with him.  According to nursing staff he did okay overnight.  Patient states  that he is feeling fine.  He does tend to get anxious looking at his vital signs on the monitor.  Tolerated BiPAP overnight.      Assessment/Plan:  Acute Hypoxic Resp. Failure due to Acute Covid 19 Viral Illness  COVID-19 Labs  Recent Labs    06/14/19 0555 06/15/19 0606 06/16/19 0615  DDIMER <0.27 <0.27 <0.27  CRP 3.6* 1.2* <0.8    Lab Results  Component Value Date   SARSCOV2NAA Detected (A) 05/28/2019   Patient was initially hospitalized and placed on Remdesivir and steroids.  He did show improvement.  However patient acutely decompensated on 7/6.  He was transferred to the intensive care unit and placed on BiPAP.  Patient's respiratory status remains tenuous.  He is still requiring 80 to 90% FiO2 via high flow nasal cannula.  40 L/min.  Saturations are in the mid to late 80s.  Dropping into the 70s at times.  He has completed a course of Remdesivir.  He remains on Solu-Medrol.  He has received Actemra as well.  He has been receiving furosemide as well.  Increase in creatinine noted today.  Will hold off on further doses of furosemide.  Continue to monitor.  Prognosis remains guarded.  Patient has been enrolled into the fibrogen research study as well.  COVID-19 medications: 7/8>> Actemra 7/7>> Actemra 7/7>> Fibrogen trial - 8588-502 - CTGF MAb v Placebo - Blinded (planned) (Consented by Dr Chase Caller) 6/27>> Solumedrol 6/27>>Remdesivir   History of CLL  WBC noted to be high due to this history of CLL.  Noted to be significantly higher today.  Patient is on chronic steroids (prednisolone 5 mg daily).  Also is on prophylactic Bactrim.  Follow-up with oncology after discharge.  Due to persistent hyperkalemia we will change his Bactrim over to atovaquone.  History of chronic kidney disease stage III Slight worsening in renal function noted today with rise in creatinine to 2.03.  Potassium 5.3.  Hold off on further doses of Lasix.  Normocytic anemia No evidence of overt  bleeding.  Hemoglobin has been stable.    Hyperkalemia Evaded potassium level most likely due to Bactrim.  He was given lokelma yesterday.  We will repeat another dose today.  Change Bactrim over to atovaquone.    History of hypothyroidism Continue Synthroid.  History of COPD/asthma Stable.  Continue bronchodilators.  History of CVA Continue Plavix.  Stable.  History of obstructive sleep apnea Stable.  Morbid obesity BMI is 40.37  Essential hypertension Monitor blood pressures closely.    History of GERD Continue PPI  History of autoimmune hemolytic anemia related to CLL Hemoglobin remains stable.    History of depression Continue Celexa.  Significant hearing impairment/deafness  Jehovah's witness Patient refuses all blood products including convalescent plasma.  Goals of care Patient remains tenuous but stable.  He feels okay but is oxygen saturation are mostly in the mid 80s despite being on 100% FiO2.  Continue DNR status.    DVT Prophylaxis: Heparin subcutaneously PUD Prophylaxis: Protonix Code Status: DNR Family Communication: Updating his wife on a daily basis. Disposition Plan: Remain in ICU for now   Medications:  Scheduled: . benzonatate  100 mg Oral TID  . chlorhexidine  15 mL Mouth Rinse BID  . Chlorhexidine Gluconate Cloth  6 each Topical Daily  . citalopram  10 mg Oral Daily  . clopidogrel  75 mg Oral Daily  . fluticasone  2 puff Inhalation BID  . folic acid  1 mg Oral Daily  . heparin  7,500 Units Subcutaneous Q8H  . levothyroxine  50 mcg Oral Q0600  . mouth rinse  15 mL Mouth Rinse q12n4p  . methylPREDNISolone (SOLU-MEDROL) injection  40 mg Intravenous Q12H  . metoprolol tartrate  12.5 mg Oral BID  . pantoprazole  40 mg Oral Daily  . [START ON 06/19/2019] pamrevlumab or placebo  35 mg/kg (Order-Specific) Intravenous Q7 days   Followed by  . [START ON 07/03/2019] pamrevlumab or placebo  35 mg/kg (Order-Specific) Intravenous Once  .  sulfamethoxazole-trimethoprim  1 tablet Oral Q M,W,F   Continuous:  HEN:IDPOEUMPNTIRW, chlorpheniramine-HYDROcodone, diphenhydrAMINE, guaiFENesin-dextromethorphan, HYDROcodone-acetaminophen, ipratropium-albuterol, ondansetron **OR** ondansetron (ZOFRAN) IV   Objective:  Vital Signs  Vitals:   06/16/19 0600 06/16/19 0700 06/16/19 0800 06/16/19 0900  BP: 135/64 121/64 115/65 (!) 144/72  Pulse: 84 85    Resp: 20 20    Temp:      TempSrc:      SpO2: (!) 88% 94%    Weight:      Height:        Intake/Output Summary (Last 24 hours) at 06/16/2019 1118 Last data filed at 06/16/2019 0900 Gross per 24 hour  Intake 0 ml  Output 1175 ml  Net -1175 ml   Filed Weights   05/29/2019 1309 05/12/2019 1949 06/16/19 0500  Weight: 120.4 kg 114.4 kg 105.8 kg    General appearance: Awake alert.  In no distress.  Hard of hearing Resp: Mildly tachypneic at rest.  Coarse breath sounds bilaterally with  crackles at the bases bilaterally.  No wheezing or rhonchi.   Cardio: S1-S2 is normal regular.  No S3-S4.  No rubs murmurs or bruit GI: Abdomen is soft.  Nontender nondistended.  Bowel sounds are present normal.  No masses organomegaly Extremities: Minimal edema bilateral lower extremities Neurologic: Hard of hearing.  No obvious focal neurological deficits.      Lab Results:  Data Reviewed: I have personally reviewed following labs and imaging studies  CBC: Recent Labs  Lab 06/12/19 0523 06/13/19 0740 06/14/19 0555 06/15/19 0606 06/16/19 0615  WBC 83.7* 99.8* 99.7* 101.3* 132.6*  HGB 9.8* 9.6* 9.8* 10.6* 11.7*  HCT 32.7* 33.2* 34.1* 35.6* 41.0  MCV 97.9 99.4 100.3* 98.9 99.5  PLT 242 255 264 255 109    Basic Metabolic Panel: Recent Labs  Lab 06/12/19 0523 06/13/19 0740 06/15/19 0606 06/15/19 1810 06/16/19 0615  NA 139 140 138 137 138  K 4.1 4.9 5.7* 5.2* 5.3*  CL 104 107 105 100 101  CO2 24 24 22 25 25   GLUCOSE 143* 127* 146* 107* 131*  BUN 49* 51* 64* 69* 76*  CREATININE  1.77* 1.58* 1.63* 1.82* 2.03*  CALCIUM 9.1 9.0 9.3 9.7 9.4    GFR: Estimated Creatinine Clearance: 39.8 mL/min (A) (by C-G formula based on SCr of 2.03 mg/dL (H)).  Liver Function Tests: Recent Labs  Lab 06/11/19 0405 06/12/19 0523 06/13/19 0740 06/15/19 0606 06/16/19 0615  AST 15 13* 14* 30 20  ALT 19 18 18 22 22   ALKPHOS 46 44 46 44 50  BILITOT 0.9 0.6 0.4 0.7 0.5  PROT 6.1* 6.3* 6.3* 6.3* 6.9  ALBUMIN 3.5 3.5 3.4* 3.7 4.1     Recent Results (from the past 240 hour(s))  MRSA PCR Screening     Status: None   Collection Time: 06/11/19  3:00 AM   Specimen: Nasopharyngeal  Result Value Ref Range Status   MRSA by PCR NEGATIVE NEGATIVE Final    Comment:        The GeneXpert MRSA Assay (FDA approved for NASAL specimens only), is one component of a comprehensive MRSA colonization surveillance program. It is not intended to diagnose MRSA infection nor to guide or monitor treatment for MRSA infections. Performed at [facility], [place] [address], [place], [place] [address]       Radiology Studies: No results found.     LOS: 14 days   Carlous Olivares [employer] on [email or web address]  06/16/2019, 11:18 AM

## 2019-06-16 NOTE — Progress Notes (Signed)
NAME:  [name] Ball, MRN:  [id number], DOB:  01-10-1951, LOS: 51 ADMISSION DATE:  05/19/2019, CONSULTATION DATE:  7/6 REFERRING MD: Elgergawy, CHIEF COMPLAINT:  Dyspnea   Brief History   68 y/o male with ARDS from COVID 19 pneumonia.  Past Medical History  CLL, chronic prednisone CKD COPD on home oxygen OSA on CPAP/BIPAP Diverticulitis Asthma Hypothyroid  Significant Hospital Events   June 27 admission July 6 moved to ICU, enrolled in clinical trial, BiPAP as needed, heated high flow. July 7-10: Remains on heated high flow, BiPAP at night, some diuresis, severe hypoxemia but minimal complaints of dyspnea July 11 confused  Consults:  PCCM  Procedures:    Significant Diagnostic Tests:    Micro Data:    Antimicrobials:  Bactrim > chronic 6/27 Remdesivir > 7/1, restarted 7/6  7/7 Tocilizumab >  7/7 Enrolled in clinical trial [employer] study of Pamrevlumab  Interim history/subjective:   Appears confused today Still conversant, no specific complaints, nurses say he has been fixated on his O2 saturation  Denies dyspnea   Objective   Blood pressure 121/64, pulse 85, temperature 98.7 F (37.1 C), temperature source Oral, resp. rate 20, height 5\' 7"  (1.702 m), weight 105.8 kg, SpO2 94 %.    Vent Mode: BIPAP FiO2 (%):  [70 %-100 %] 80 % Set Rate:  [8 bmp] 8 bmp PEEP:  [8 cmH20] 8 cmH20   Intake/Output Summary (Last 24 hours) at 06/16/2019 0754 Last data filed at 06/16/2019 0600 Gross per 24 hour  Intake -  Output 1125 ml  Net -1125 ml   Filed Weights   05/13/2019 1309 05/21/2019 1949 06/16/19 0500  Weight: 120.4 kg 114.4 kg 105.8 kg    Examination:  General:  Morbidly obese elderly male resting comfortably in bed HENT: NCAT OP clear PULM: Difficult exam, crackles bases B, normal effort CV: RRR, no mgr GI: BS+, soft, nontender MSK: normal bulk and tone Neuro: awake, communicating with sign language      Resolved Hospital Problem list      Assessment & Plan:  Severe Acute Respiratory Failure with hypoxemia due to COVID 19 Pneumonia Prognosis guarded, if he survives this will take weeks Continue study protocol per fibrogen protocol Continue Solu-Medrol Tolerate periods of hypoxemia, goal at rest is greater than 85% SaO2, with movement ideally above 75% Decision for intubation should be based on a change in mental status or physical evidence of ventilatory failure such as nasal flaring, accessory muscle use, paradoxical breathing Out of bed to chair as able Prone positioning while in bed Continue heated high flow oxygen Move O2 saturation monitor out of his site as this has been causing more anxiety Close monitoring in ICU If dyspnea worsens, he starts to struggle we need to move forward with comfort measures. No BiPAP for increased work of breathing.  OSA BiPAP nightly: This is only to be used for OSA/obesity hypoventilation, do not use in the event of worsening hypercarbia or increased work of breathing.  Acute diastolic heart failure: improved fluid status Mild bump in creatinine today Hold further lasix  CLL Monitor WBC  Prognosis: Very poor, I suspect he will die during his ICU stay.   Best practice:  Diet: full Pain/Anxiety/Delirium protocol (if indicated): none baseline VAP protocol (if indicated): n/a DVT prophylaxis: yes> sub q heparin per COVID ICU Protocol enhanced dosing GI prophylaxis: sub q heparin Glucose control: SSI Mobility: bedrest Code Status: full Family Communication: per TRH Disposition: remain in ICU  Labs   CBC:  Recent Labs  Lab 06/12/19 0523 06/13/19 0740 06/14/19 0555 06/15/19 0606 06/16/19 0615  WBC 83.7* 99.8* 99.7* 101.3* PENDING  HGB 9.8* 9.6* 9.8* 10.6* 11.7*  HCT 32.7* 33.2* 34.1* 35.6* 41.0  MCV 97.9 99.4 100.3* 98.9 99.5  PLT 242 255 264 255 009    Basic Metabolic Panel: Recent Labs  Lab 06/11/19 0405  06/11/19 1419 06/12/19 0523 06/13/19 0740 06/15/19  0606 06/15/19 1810  NA 138   < > 133* 139 140 138 137  K 3.7   < > 4.4 4.1 4.9 5.7* 5.2*  CL 103  --   --  104 107 105 100  CO2 26  --   --  24 24 22 25   GLUCOSE 134*  --   --  143* 127* 146* 107*  BUN 56*  --   --  49* 51* 64* 69*  CREATININE 1.78*  --   --  1.77* 1.58* 1.63* 1.82*  CALCIUM 8.9  --   --  9.1 9.0 9.3 9.7   < > = values in this interval not displayed.   GFR: Estimated Creatinine Clearance: 44.4 mL/min (A) (by C-G formula based on SCr of 1.82 mg/dL (H)). Recent Labs  Lab 06/11/19 1315 06/12/19 0523 06/13/19 0740 06/14/19 0555 06/15/19 0606 06/16/19 0615  PROCALCITON 0.24 0.17 0.10  --   --   --   WBC 95.5* 83.7* 99.8* 99.7* 101.3* PENDING    Liver Function Tests: Recent Labs  Lab 06/10/19 0436 06/11/19 0405 06/12/19 0523 06/13/19 0740 06/15/19 0606  AST 14* 15 13* 14* 30  ALT 24 19 18 18 22   ALKPHOS 50 46 44 46 44  BILITOT 0.7 0.9 0.6 0.4 0.7  PROT 6.3* 6.1* 6.3* 6.3* 6.3*  ALBUMIN 3.8 3.5 3.5 3.4* 3.7   No results for input(s): LIPASE, AMYLASE in the last 168 hours. No results for input(s): AMMONIA in the last 168 hours.  ABG    Component Value Date/Time   PHART 7.554 (H) 06/11/2019 1419   PCO2ART 25.7 (L) 06/11/2019 1419   PO2ART 31.0 (LL) 06/11/2019 1419   HCO3 22.7 06/11/2019 1419   TCO2 23 06/11/2019 1419   O2SAT 71.0 06/11/2019 1419     Coagulation Profile: No results for input(s): INR, PROTIME in the last 168 hours.  Cardiac Enzymes: No results for input(s): CKTOTAL, CKMB, CKMBINDEX, TROPONINI in the last 168 hours.  HbA1C: Hgb A1C (fingerstick)  Date/Time Value Ref Range Status  05/25/2016 09:01 AM 6.0 (H) <5.7 % Final    Comment:                                                                           According to the ADA Clinical Practice Recommendations for 2011, when HbA1c is used as a screening test:     >=6.5%   Diagnostic of Diabetes Mellitus            (if abnormal result is confirmed)   5.7-6.4%   Increased  risk of developing Diabetes Mellitus   References:Diagnosis and Classification of Diabetes Mellitus,Diabetes QZRA,0762,26(JFHLK 1):S62-S69 and Standards of Medical Care in         Diabetes - 2011,Diabetes TGYB,6389,37 (Suppl 1):S11-S61.      Hgb A1c MFr  Bld  Date/Time Value Ref Range Status  01/12/2019 12:01 PM 5.2 <5.7 % of total Hgb Final    Comment:    For the purpose of screening for the presence of diabetes: . <5.7%       Consistent with the absence of diabetes 5.7-6.4%    Consistent with increased risk for diabetes             (prediabetes) > or =6.5%  Consistent with diabetes . This assay result is consistent with a decreased risk of diabetes. . Currently, no consensus exists regarding use of hemoglobin A1c for diagnosis of diabetes in children. . According to American Diabetes Association (ADA) guidelines, hemoglobin A1c <7.0% represents optimal control in non-pregnant diabetic patients. Different metrics may apply to specific patient populations.  Standards of Medical Care in Diabetes(ADA). Marland Kitchen   04/07/2018 11:31 AM <4.0 <5.7 % of total Hgb Final    Comment:    Verified by repeat analysis. . For the purpose of screening for the presence of diabetes: . <5.7%       Consistent with the absence of diabetes 5.7-6.4%    Consistent with increased risk for diabetes             (prediabetes) > or =6.5%  Consistent with diabetes . This assay result is consistent with a decreased risk of diabetes. . Currently, no consensus exists regarding use of hemoglobin A1c for diagnosis of diabetes in children. . According to American Diabetes Association (ADA) guidelines, hemoglobin A1c <7.0% represents optimal control in non-pregnant diabetic patients. Different metrics may apply to specific patient populations.  Standards of Medical Care in Diabetes(ADA). .     CBG: No results for input(s): GLUCAP in the last 168 hours.   Critical care time: 31 minutes    Roselie Awkward, MD [place] [facility] Pager: [phone] Cell: [phone] If no response, call [phone]

## 2019-06-16 NOTE — Progress Notes (Signed)
[facility]  Progressively worsening hypoxemia, complains of dyspnea and chest pain. Wife wants to come visit Would like to receive some morphine now to help with chest pain. After visit with wife plan morphine infusion  Roselie Awkward, MD [place] [facility] Pager: [phone] Cell: [phone] If no response, call [phone]

## 2019-07-07 NOTE — Progress Notes (Signed)
CDS notified of death 0044 Jun 29, 2019 Ref # [id number] Not Suitable for Organ donation

## 2019-07-07 NOTE — Death Summary Note (Signed)
DEATH SUMMARY   Patient Details  Name: [name] Ball MRN: [id number] DOB: [birth year]  Admission/Discharge Information   Admit Date:  2019-06-08  Date of Death: Date of Death: 23-Jun-2019  Time of Death: Time of Death: Mar 06, 2043  Length of Stay: 02-24-2023  Referring Physician: Alycia Rossetti, MD   Reason(s) for Hospitalization  Acute respiratory disease due to COVID-19  Diagnoses  Preliminary cause of death: Acute respiratory disease due to COVID-19  Secondary Diagnoses (including complications and co-morbidities):    Hypothyroidism   CKD (chronic kidney disease), stage III ([place])   COPD with asthma ([place])   GERD (gastroesophageal reflux disease)   Essential hypertension, benign   Depression   CLL (chronic lymphocytic leukemia) ([place])   History of CVA (cerebrovascular accident)    Brief Hospital Course (including significant findings, care, treatment, and services provided and events leading to death)   Brief History: Patient is a1 y.o.malewith PMHx of CLL, HTN, Hypothyroidism, hx of CVA, GERD, COPD/asthma, CKD stage 3 and severe hearing impared/deafness-presented to the hospital with SOB-found to have acute hypoxic resp failure secondary to COVID 19 PNA.Patient was treated with Remdesivir and IV steroids with improvement-unfortunately on 7/6-patient's hypoxemia worsened-requiring 15 L high flow oxygen-and was subsequently transferred to the ICU.   Hospital Course:  Acute Hypoxic Resp. Failure due to Acute Covid 19 Viral Illness Patient was hospitalized.  He was placed on Remdesivir and steroid.  He initially improved but then decompensated.  He was transferred to the intensive care unit.  He was given Actemra as well.  He was also enrolled into the fibrogen trial.  As he was a [place] Witness he declined blood products and so was not given convalescent plasma.  He was placed on BiPAP.  However despite maximal treatment patient continued to decline.  He was DNR status.  Patient  required high doses of oxygen.  On 7/11 patient started declining.  He had difficulty keeping his saturations up.  He started feeling uncomfortable.  Half of his wife was notified.  She was given an opportunity to come and visit him which she did.  Subsequently patient was placed on a morphine infusion.  He subsequently passed away on 2019/06/23 at 12:44 AM.    OTHER MEDICAL PROBLEMS:  History of CLL He had chronically elevated WBC.  Chronically on steroids at home.  Also on Bactrim for prophylaxis purposes.  History of chronic kidney disease stage III  Normocytic anemia  Hyperkalemia Elevated potassium level thought to be secondary to Bactrim.  History of hypothyroidism  History of COPD/asthma  History of CVA Plavix was continued  History of obstructive sleep apnea   Morbid obesity BMI is 40.37  Essential hypertension   History of GERD   History of autoimmune hemolytic anemia related to CLL   History of depression   Significant hearing impairment/deafness  [employer]ehovah's witness Patient refused all blood products including convalescent plasma.     Pertinent Labs and Studies  Significant Diagnostic Studies Dg Chest Port 1 View  Result Date: 06/14/2019 CLINICAL DATA:  Acute respiratory failure with hypoxemia secondary to COVID-19 pneumonia. EXAM: PORTABLE CHEST 1 VIEW COMPARISON:  Single-view of the chest 06/11/2019 and 06/09/2019 FINDINGS: Left worse than right airspace disease persists without notable change since the most recent examination. Heart size is upper normal. No pneumothorax. There may be a small left pleural effusion. No acute bony abnormality. IMPRESSION: No marked change in left worse than right airspace disease. Electronically Signed   By: Inge Rise M.D.  On: 06/14/2019 11:38   Dg Chest Port 1 View  Result Date: 06/11/2019 CLINICAL DATA:  Shortness of breath EXAM: PORTABLE CHEST 1 VIEW COMPARISON:  06/09/2019 FINDINGS: Bilateral  lower lobe airspace disease. Possible trace left pleural effusion. No pneumothorax. Stable cardiomediastinal silhouette. No aggressive osseous lesion. IMPRESSION: Bibasilar airspace disease concerning for pneumonia. Electronically Signed   By: Kathreen Devoid   On: 06/11/2019 09:15   Dg Chest Port 1 View  Result Date: 06/09/2019 CLINICAL DATA:  Acute hypoxic respiratory failure. EXAM: PORTABLE CHEST 1 VIEW COMPARISON:  Radiograph June 03, 2019. FINDINGS: Stable cardiomegaly. No pneumothorax is noted. Stable bibasilar opacities are noted concerning for infiltrates or multifocal pneumonia. Small left pleural effusion cannot be excluded. Bony thorax is unremarkable. IMPRESSION: Stable bibasilar opacities are noted concerning for multifocal pneumonia. Possible small left pleural effusion may be present. Electronically Signed   By: Marijo Conception M.D.   On: 06/09/2019 11:43   Dg Chest Port 1 View  Result Date: 06/03/2019 CLINICAL DATA:  Shortness of breath EXAM: PORTABLE CHEST 1 VIEW COMPARISON:  Chest x-rays dated 05/17/2019 and 11/07/202017. FINDINGS: Persistent opacity at the LEFT lung base. Additional opacity now noted within the periphery of the LEFT mid lung. Chronic opacities noted at the RIGHT lung base. No pneumothorax seen. Stable cardiomegaly. IMPRESSION: 1. Persistent opacity at the LEFT lung base. Additional opacity now noted within the periphery of the LEFT mid lung. Findings are suspicious for multifocal pneumonia. Consider chest CT for more definitive characterization. 2. Cardiomegaly. Electronically Signed   By: Franki Cabot M.D.   On: 06/03/2019 08:02   Dg Chest Port 1 View  Result Date: 05/11/2019 CLINICAL DATA:  Shortness of breath and cough EXAM: PORTABLE CHEST 1 VIEW COMPARISON:  June 16, 2016. FINDINGS: There is an area of ill-defined opacity adjacent to the left heart border which is suspicious for an area of pneumonia. Lungs elsewhere clear. Heart is upper normal in size with pulmonary  vascularity normal. No adenopathy. No bone lesions. IMPRESSION: Ill-defined opacity adjacent to the left heart border suspicious for pneumonia. Lungs elsewhere clear. Heart upper normal in size. Followup PA and lateral chest radiographs recommended in 3-4 weeks following trial of antibiotic therapy to ensure resolution and exclude underlying malignancy. Electronically Signed   By: Lowella Grip III M.D.   On: 05/07/2019 14:05    Microbiology Recent Results (from the past 240 hour(s))  MRSA PCR Screening     Status: None   Collection Time: 06/11/19  3:00 AM   Specimen: Nasopharyngeal  Result Value Ref Range Status   MRSA by PCR NEGATIVE NEGATIVE Final    Comment:        The GeneXpert MRSA Assay (FDA approved for NASAL specimens only), is one component of a comprehensive MRSA colonization surveillance program. It is not intended to diagnose MRSA infection nor to guide or monitor treatment for MRSA infections. Performed at [facility], [place] [address], [place], [place] [address]     Lab Basic Metabolic Panel: Recent Labs  Lab 06/12/19 0523 06/13/19 0740 06/15/19 0606 06/15/19 1810 06/16/19 0615  NA 139 140 138 137 138  K 4.1 4.9 5.7* 5.2* 5.3*  CL 104 107 105 100 101  CO2 24 24 22 25 25   GLUCOSE 143* 127* 146* 107* 131*  BUN 49* 51* 64* 69* 76*  CREATININE 1.77* 1.58* 1.63* 1.82* 2.03*  CALCIUM 9.1 9.0 9.3 9.7 9.4   Liver Function Tests: Recent Labs  Lab 06/11/19 0405 06/12/19 0523 06/13/19  0740 06/15/19 0606 06/16/19 0615  AST 15 13* 14* 30 20  ALT 19 18 18 22 22   ALKPHOS 46 44 46 44 50  BILITOT 0.9 0.6 0.4 0.7 0.5  PROT 6.1* 6.3* 6.3* 6.3* 6.9  ALBUMIN 3.5 3.5 3.4* 3.7 4.1   CBC: Recent Labs  Lab 06/12/19 0523 06/13/19 0740 06/14/19 0555 06/15/19 0606 06/16/19 0615  WBC 83.7* 99.8* 99.7* 101.3* 132.6*  HGB 9.8* 9.6* 9.8* 10.6* 11.7*  HCT 32.7* 33.2* 34.1* 35.6* 41.0  MCV 97.9 99.4 100.3* 98.9 99.5  PLT 242 255 264 255 311    Sepsis Labs: Recent Labs  Lab 06/11/19 1315 06/12/19 0523 06/13/19 0740 06/14/19 0555 06/15/19 0606 06/16/19 0615  PROCALCITON 0.24 0.17 0.10  --   --   --   WBC 95.5* 83.7* 99.8* 99.7* 101.3* 132.6*      Jasreet Dickie 07-03-2019, 7:07 AM

## 2019-07-07 NOTE — Progress Notes (Signed)
Death certificate was filled out at around 0100 this AM and left in the care of the [facility].

## 2019-07-07 NOTE — Progress Notes (Signed)
0044 pt was found to be asystole. 2 RNs confirmed that there was no heart tones, and that time of death was 2019-07-05 0044. Wife was called and notified. Md was called and notified. Post mortem care was performed and all the pt's belonging went with the pt to the morgue.

## 2019-07-07 DEATH — deceased

## 2020-03-03 IMAGING — CR PORTABLE CHEST - 1 VIEW
1 series · 2 of 2 positions shown · non-contrast
Comparison: June 16, 2016.

CLINICAL DATA: Shortness of breath and cough

EXAM:
PORTABLE CHEST 1 VIEW

[Series 1: portable · 0.17mm/px · 2 of 2 slices shown]
[im 1/2]
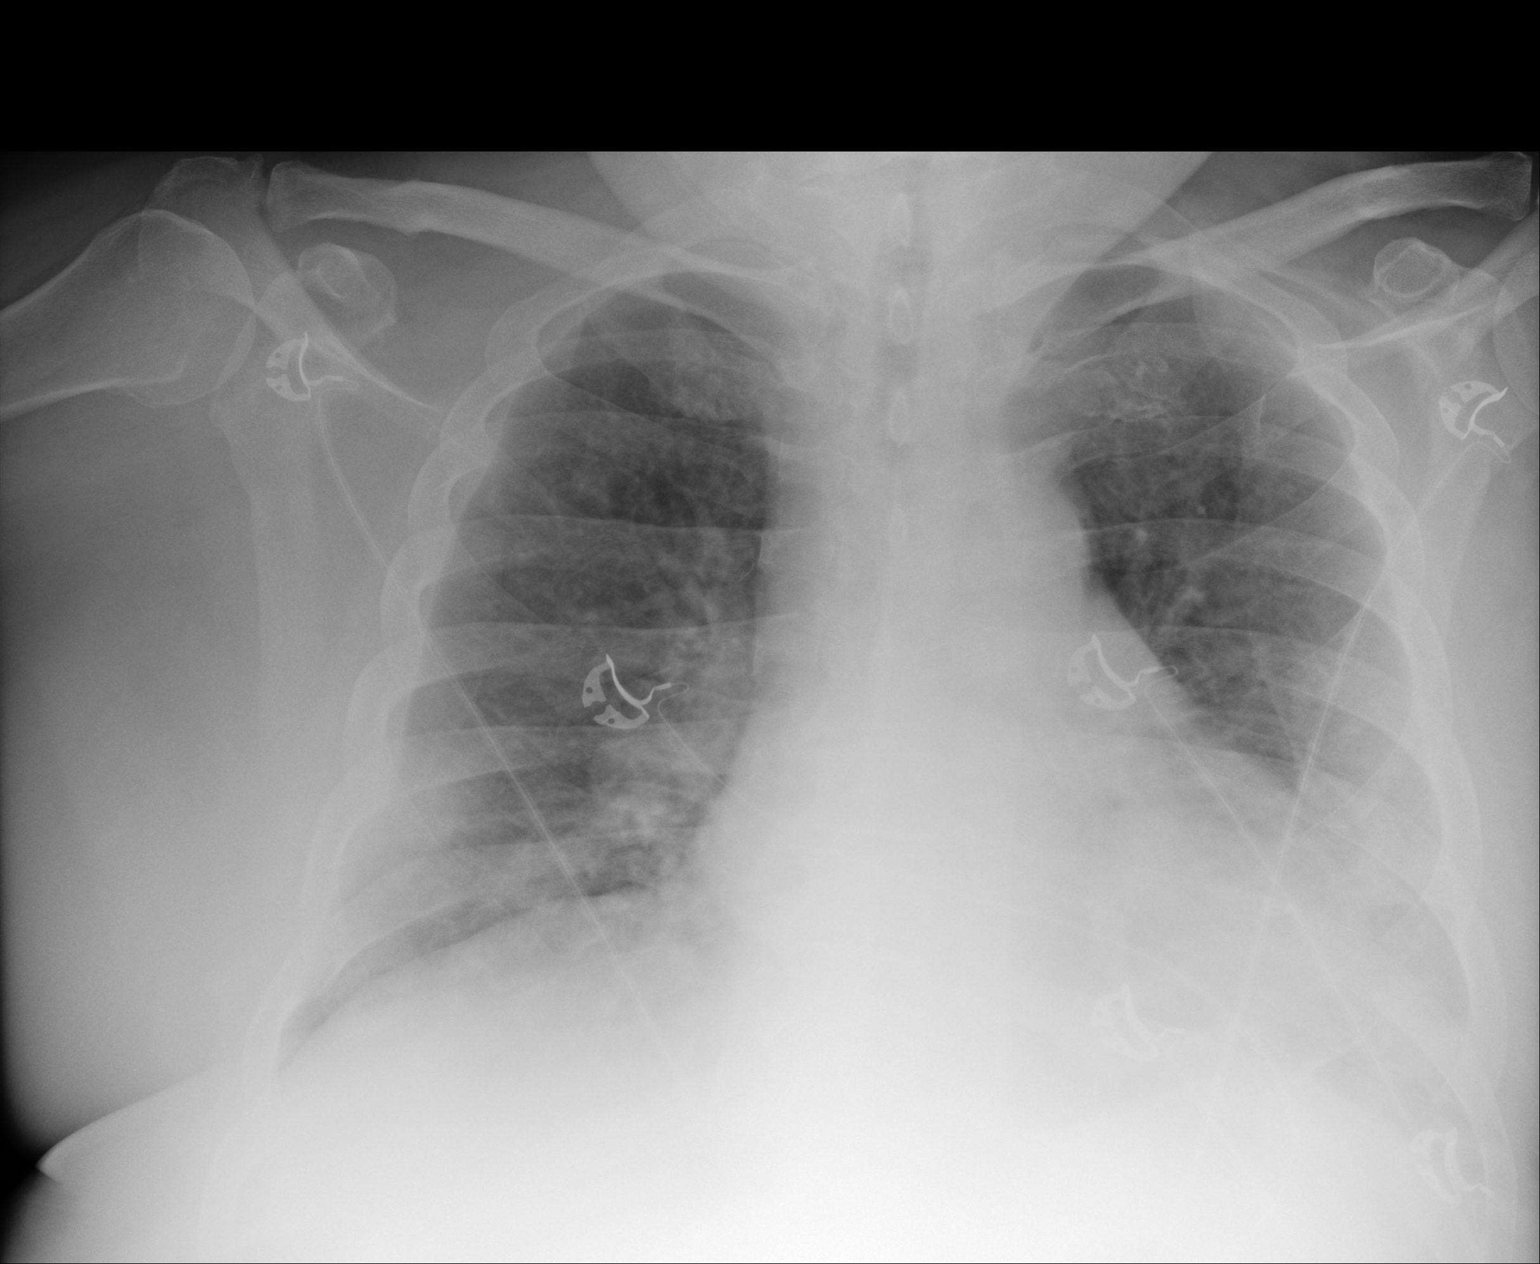
[im 2/2]
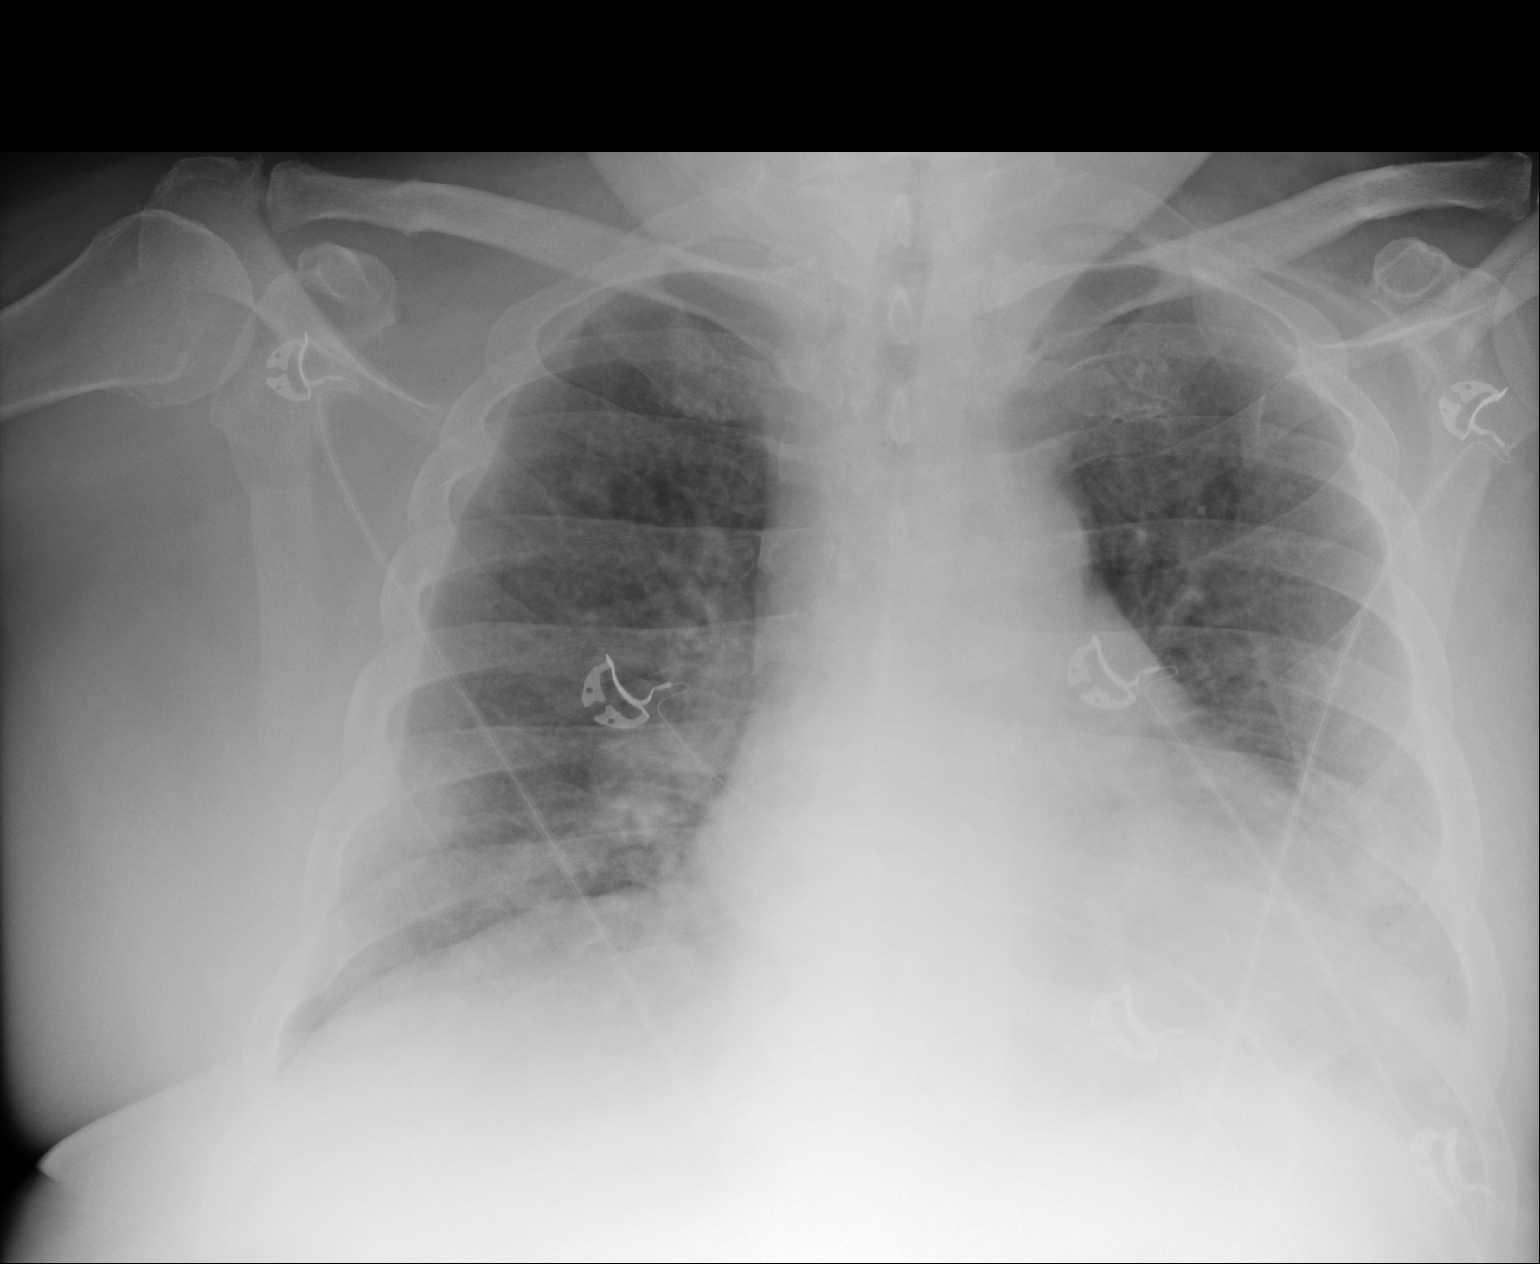

[2 of 2 positions shown; findings below may reference images not displayed]

FINDINGS: There is an area of ill-defined opacity adjacent to the left heart
border which is suspicious for an area of pneumonia. Lungs elsewhere
clear. Heart is upper normal in size with pulmonary vascularity
normal. No adenopathy. No bone lesions.
IMPRESSION: Ill-defined opacity adjacent to the left heart border suspicious for
pneumonia. Lungs elsewhere clear. Heart upper normal in size.

Followup PA and lateral chest radiographs recommended in 3-4 weeks
following trial of antibiotic therapy to ensure resolution and
exclude underlying malignancy.
